# Patient Record
Sex: Male | Born: 1935
Health system: Southern US, Community
[De-identification: ages and names within clinical notes are randomized; demographics above are authoritative.]

## PROBLEM LIST (undated history)

## (undated) DIAGNOSIS — I10 Essential (primary) hypertension: Secondary | ICD-10-CM

## (undated) DIAGNOSIS — I5042 Chronic combined systolic (congestive) and diastolic (congestive) heart failure: Secondary | ICD-10-CM

## (undated) DIAGNOSIS — I8000 Phlebitis and thrombophlebitis of superficial vessels of unspecified lower extremity: Secondary | ICD-10-CM

## (undated) DIAGNOSIS — I4729 Other ventricular tachycardia: Secondary | ICD-10-CM

## (undated) DIAGNOSIS — R042 Hemoptysis: Secondary | ICD-10-CM

## (undated) DIAGNOSIS — I219 Acute myocardial infarction, unspecified: Secondary | ICD-10-CM

## (undated) DIAGNOSIS — I480 Paroxysmal atrial fibrillation: Secondary | ICD-10-CM

## (undated) DIAGNOSIS — I82401 Acute embolism and thrombosis of unspecified deep veins of right lower extremity: Secondary | ICD-10-CM

## (undated) DIAGNOSIS — R7989 Other specified abnormal findings of blood chemistry: Secondary | ICD-10-CM

## (undated) DIAGNOSIS — E7800 Pure hypercholesterolemia, unspecified: Secondary | ICD-10-CM

## (undated) DIAGNOSIS — I4892 Unspecified atrial flutter: Secondary | ICD-10-CM

## (undated) DIAGNOSIS — R339 Retention of urine, unspecified: Secondary | ICD-10-CM

## (undated) DIAGNOSIS — D696 Thrombocytopenia, unspecified: Secondary | ICD-10-CM

## (undated) DIAGNOSIS — K2970 Gastritis, unspecified, without bleeding: Secondary | ICD-10-CM

## (undated) DIAGNOSIS — I959 Hypotension, unspecified: Secondary | ICD-10-CM

## (undated) DIAGNOSIS — I472 Ventricular tachycardia: Secondary | ICD-10-CM

## (undated) DIAGNOSIS — R310 Gross hematuria: Secondary | ICD-10-CM

## (undated) DIAGNOSIS — I4819 Other persistent atrial fibrillation: Secondary | ICD-10-CM

## (undated) DIAGNOSIS — R001 Bradycardia, unspecified: Secondary | ICD-10-CM

## (undated) DIAGNOSIS — I255 Ischemic cardiomyopathy: Secondary | ICD-10-CM

## (undated) DIAGNOSIS — I2510 Atherosclerotic heart disease of native coronary artery without angina pectoris: Secondary | ICD-10-CM

## (undated) DIAGNOSIS — D509 Iron deficiency anemia, unspecified: Secondary | ICD-10-CM

## (undated) DIAGNOSIS — D649 Anemia, unspecified: Secondary | ICD-10-CM

## (undated) DIAGNOSIS — D693 Immune thrombocytopenic purpura: Secondary | ICD-10-CM

## (undated) DIAGNOSIS — N179 Acute kidney failure, unspecified: Secondary | ICD-10-CM

## (undated) HISTORY — DX: Phlebitis and thrombophlebitis of superficial vessels of unspecified lower extremity: I80.00

## (undated) HISTORY — DX: Unspecified atrial flutter: I48.92

## (undated) HISTORY — DX: Iron deficiency anemia, unspecified: D50.9

## (undated) HISTORY — DX: Thrombocytopenia, unspecified: D69.6

## (undated) HISTORY — DX: Ventricular tachycardia: I47.2

## (undated) HISTORY — PX: OTHER SURGICAL HISTORY: SHX169

## (undated) HISTORY — DX: Other specified abnormal findings of blood chemistry: R79.89

## (undated) HISTORY — DX: Acute myocardial infarction, unspecified: I21.9

## (undated) HISTORY — DX: Atherosclerotic heart disease of native coronary artery without angina pectoris: I25.10

## (undated) HISTORY — DX: Acute embolism and thrombosis of unspecified deep veins of right lower extremity: I82.401

## (undated) HISTORY — DX: Bradycardia, unspecified: R00.1

## (undated) HISTORY — DX: Retention of urine, unspecified: R33.9

## (undated) HISTORY — DX: Essential (primary) hypertension: I10

## (undated) HISTORY — DX: Pure hypercholesterolemia, unspecified: E78.00

## (undated) HISTORY — DX: Gross hematuria: R31.0

## (undated) HISTORY — DX: Other ventricular tachycardia: I47.29

## (undated) HISTORY — DX: Chronic combined systolic (congestive) and diastolic (congestive) heart failure: I50.42

## (undated) HISTORY — PX: CARPAL TUNNEL RELEASE: SHX101

## (undated) HISTORY — DX: Hemoptysis: R04.2

## (undated) HISTORY — DX: Ischemic cardiomyopathy: I25.5

## (undated) HISTORY — DX: Hypotension, unspecified: I95.9

## (undated) HISTORY — PX: REPLACEMENT TOTAL KNEE BILATERAL: SUR1225

---

## 1998-05-28 ENCOUNTER — Other Ambulatory Visit: Admission: RE | Admit: 1998-05-28 | Discharge: 1998-05-28 | Payer: Self-pay | Admitting: Urology

## 2000-07-18 ENCOUNTER — Encounter: Payer: Self-pay | Admitting: Orthopedic Surgery

## 2000-07-19 ENCOUNTER — Inpatient Hospital Stay (HOSPITAL_COMMUNITY): Admission: RE | Admit: 2000-07-19 | Discharge: 2000-07-24 | Payer: Self-pay | Admitting: Orthopedic Surgery

## 2000-08-17 ENCOUNTER — Encounter: Admission: RE | Admit: 2000-08-17 | Discharge: 2000-09-14 | Payer: Self-pay | Admitting: Orthopedic Surgery

## 2000-11-07 ENCOUNTER — Encounter: Admission: RE | Admit: 2000-11-07 | Discharge: 2000-11-07 | Payer: Self-pay | Admitting: Orthopedic Surgery

## 2000-11-07 ENCOUNTER — Encounter: Payer: Self-pay | Admitting: Orthopedic Surgery

## 2000-11-21 ENCOUNTER — Encounter: Payer: Self-pay | Admitting: Orthopedic Surgery

## 2000-11-21 ENCOUNTER — Encounter: Admission: RE | Admit: 2000-11-21 | Discharge: 2000-11-21 | Payer: Self-pay | Admitting: Orthopedic Surgery

## 2001-06-29 ENCOUNTER — Encounter: Admission: RE | Admit: 2001-06-29 | Discharge: 2001-06-29 | Payer: Self-pay | Admitting: Orthopedic Surgery

## 2001-06-29 ENCOUNTER — Encounter: Payer: Self-pay | Admitting: Orthopedic Surgery

## 2001-07-13 ENCOUNTER — Encounter: Admission: RE | Admit: 2001-07-13 | Discharge: 2001-07-13 | Payer: Self-pay | Admitting: Orthopedic Surgery

## 2001-07-13 ENCOUNTER — Encounter: Payer: Self-pay | Admitting: Orthopedic Surgery

## 2003-06-18 ENCOUNTER — Inpatient Hospital Stay (HOSPITAL_COMMUNITY): Admission: RE | Admit: 2003-06-18 | Discharge: 2003-06-23 | Payer: Self-pay | Admitting: Orthopedic Surgery

## 2006-11-15 ENCOUNTER — Inpatient Hospital Stay (HOSPITAL_COMMUNITY): Admission: RE | Admit: 2006-11-15 | Discharge: 2006-11-19 | Payer: Self-pay | Admitting: Orthopedic Surgery

## 2007-07-13 HISTORY — PX: CORONARY STENT PLACEMENT: SHX1402

## 2007-08-31 ENCOUNTER — Ambulatory Visit (HOSPITAL_BASED_OUTPATIENT_CLINIC_OR_DEPARTMENT_OTHER): Admission: RE | Admit: 2007-08-31 | Discharge: 2007-08-31 | Payer: Self-pay | Admitting: Orthopedic Surgery

## 2008-03-21 ENCOUNTER — Inpatient Hospital Stay (HOSPITAL_COMMUNITY): Admission: AD | Admit: 2008-03-21 | Discharge: 2008-03-24 | Payer: Self-pay | Admitting: Cardiology

## 2008-03-21 ENCOUNTER — Ambulatory Visit: Payer: Self-pay | Admitting: Cardiology

## 2008-03-21 DIAGNOSIS — I219 Acute myocardial infarction, unspecified: Secondary | ICD-10-CM

## 2008-03-21 HISTORY — DX: Acute myocardial infarction, unspecified: I21.9

## 2008-03-22 ENCOUNTER — Encounter: Payer: Self-pay | Admitting: Cardiology

## 2008-04-08 ENCOUNTER — Ambulatory Visit: Payer: Self-pay | Admitting: Cardiology

## 2008-05-06 ENCOUNTER — Ambulatory Visit: Payer: Self-pay | Admitting: Cardiology

## 2008-05-06 LAB — CONVERTED CEMR LAB
ALT: 24 units/L (ref 0–53)
AST: 29 units/L (ref 0–37)
Albumin: 4.2 g/dL (ref 3.5–5.2)
Alkaline Phosphatase: 75 units/L (ref 39–117)
Basophils Relative: 0.9 % (ref 0.0–3.0)
Bilirubin, Direct: 0.3 mg/dL (ref 0.0–0.3)
Cholesterol: 92 mg/dL (ref 0–200)
Eosinophils Relative: 4.6 % (ref 0.0–5.0)
HCT: 42.8 % (ref 39.0–52.0)
HDL: 24.7 mg/dL — ABNORMAL LOW (ref >39.0)
Hemoglobin: 14.8 g/dL (ref 13.0–17.0)
LDL Cholesterol: 44 mg/dL (ref 0–99)
Lymphocytes Relative: 29 % (ref 12.0–46.0)
MCHC: 34.6 g/dL (ref 30.0–36.0)
MCV: 85.1 fL (ref 78.0–100.0)
Monocytes Relative: 10.3 % (ref 3.0–12.0)
Neutrophils Relative %: 55.2 % (ref 43.0–77.0)
Platelets: 116 10*3/uL — ABNORMAL LOW (ref 150–400)
RBC: 5.02 M/uL (ref 4.22–5.81)
RDW: 12.5 % (ref 11.5–14.6)
Total Bilirubin: 0.8 mg/dL (ref 0.3–1.2)
Total CHOL/HDL Ratio: 3.7
Total Protein: 6.9 g/dL (ref 6.0–8.3)
Triglycerides: 116 mg/dL (ref 0–149)
VLDL: 23 mg/dL (ref 0–40)
WBC: 6.3 10*3/uL (ref 4.5–10.5)

## 2008-06-13 ENCOUNTER — Ambulatory Visit: Payer: Self-pay | Admitting: Cardiology

## 2008-07-24 ENCOUNTER — Ambulatory Visit: Payer: Self-pay | Admitting: Cardiology

## 2008-11-14 ENCOUNTER — Telehealth: Payer: Self-pay | Admitting: Cardiology

## 2008-11-18 ENCOUNTER — Ambulatory Visit: Payer: Self-pay | Admitting: Cardiology

## 2008-11-18 DIAGNOSIS — R310 Gross hematuria: Secondary | ICD-10-CM

## 2008-11-18 DIAGNOSIS — I8000 Phlebitis and thrombophlebitis of superficial vessels of unspecified lower extremity: Secondary | ICD-10-CM | POA: Insufficient documentation

## 2008-11-18 HISTORY — DX: Gross hematuria: R31.0

## 2008-11-29 ENCOUNTER — Encounter: Payer: Self-pay | Admitting: Cardiology

## 2008-11-29 ENCOUNTER — Ambulatory Visit: Payer: Self-pay

## 2009-03-19 ENCOUNTER — Ambulatory Visit: Payer: Self-pay | Admitting: Cardiology

## 2009-04-01 ENCOUNTER — Encounter: Payer: Self-pay | Admitting: Cardiology

## 2009-05-13 ENCOUNTER — Encounter: Payer: Self-pay | Admitting: Cardiology

## 2009-06-10 ENCOUNTER — Encounter: Payer: Self-pay | Admitting: Cardiology

## 2009-07-09 ENCOUNTER — Encounter: Payer: Self-pay | Admitting: Cardiology

## 2009-10-02 ENCOUNTER — Ambulatory Visit: Payer: Self-pay | Admitting: Internal Medicine

## 2009-10-02 ENCOUNTER — Ambulatory Visit: Payer: Self-pay

## 2009-10-02 ENCOUNTER — Ambulatory Visit: Payer: Self-pay | Admitting: Cardiology

## 2009-10-02 ENCOUNTER — Encounter: Payer: Self-pay | Admitting: Cardiology

## 2009-10-02 ENCOUNTER — Ambulatory Visit (HOSPITAL_COMMUNITY): Admission: RE | Admit: 2009-10-02 | Discharge: 2009-10-02 | Payer: Self-pay | Admitting: Cardiology

## 2009-11-18 ENCOUNTER — Inpatient Hospital Stay (HOSPITAL_COMMUNITY): Admission: EM | Admit: 2009-11-18 | Discharge: 2009-11-24 | Payer: Self-pay | Admitting: Emergency Medicine

## 2009-11-18 ENCOUNTER — Ambulatory Visit: Payer: Self-pay | Admitting: Internal Medicine

## 2009-11-20 ENCOUNTER — Ambulatory Visit: Payer: Self-pay | Admitting: Physical Medicine & Rehabilitation

## 2009-11-20 ENCOUNTER — Encounter: Payer: Self-pay | Admitting: Cardiology

## 2009-11-24 ENCOUNTER — Ambulatory Visit: Payer: Self-pay | Admitting: Physical Medicine & Rehabilitation

## 2009-11-24 ENCOUNTER — Ambulatory Visit: Payer: Self-pay | Admitting: Surgery

## 2009-11-24 ENCOUNTER — Encounter (INDEPENDENT_AMBULATORY_CARE_PROVIDER_SITE_OTHER): Payer: Self-pay | Admitting: General Surgery

## 2009-11-24 ENCOUNTER — Inpatient Hospital Stay (HOSPITAL_COMMUNITY)
Admission: RE | Admit: 2009-11-24 | Discharge: 2009-11-29 | Payer: Self-pay | Admitting: Physical Medicine & Rehabilitation

## 2010-01-02 ENCOUNTER — Telehealth: Payer: Self-pay | Admitting: Cardiology

## 2010-01-23 ENCOUNTER — Ambulatory Visit: Payer: Self-pay | Admitting: Cardiology

## 2010-01-29 ENCOUNTER — Encounter: Payer: Self-pay | Admitting: Cardiology

## 2010-05-01 ENCOUNTER — Encounter: Payer: Self-pay | Admitting: Cardiology

## 2010-06-26 ENCOUNTER — Ambulatory Visit: Payer: Self-pay | Admitting: Cardiology

## 2010-06-26 ENCOUNTER — Encounter: Payer: Self-pay | Admitting: Cardiology

## 2010-08-07 ENCOUNTER — Encounter: Payer: Self-pay | Admitting: Cardiology

## 2010-08-11 NOTE — Letter (Signed)
Summary: [facility] Office Visit Note   [facility] Office Visit Note   Imported By: Sallee Provencal 01/26/2010 13:23:20  _____________________________________________________________________  External Attachment:    Type:   Image     Comment:   External Document

## 2010-08-11 NOTE — Progress Notes (Signed)
Summary: appt  Phone Note Call from Patient Call back at Home Phone [phone]   Caller: Spouse Reason for Call: Talk to Nurse Summary of Call: pt had to cancel appt today.... sick, resch appt to 8/3, is this to far out? Initial call taken by: Darnell Level,  January 02, 2010 8:40 AM  Follow-up for Phone Call        I spoke with the pt's wife and moved the pt's appt up to 01/23/10.  Follow-up by: Theodosia Quay, RN, BSN,  January 02, 2010 9:49 AM

## 2010-08-11 NOTE — Letter (Signed)
Summary: [facility]  [facility]   Imported By: Sallee Provencal 09/05/2009 14:32:27  _____________________________________________________________________  External Attachment:    Type:   Image     Comment:   External Document

## 2010-08-11 NOTE — Assessment & Plan Note (Signed)
Summary: EPH   Visit Type:  post-hospital  CC:  Right hand cramps.  History of Present Illness: Was hospitalized after falling off of a ladder.  He was climbing on the roof.  Has seen Dr. Terance Hart, and Dr. Eddie Dibbles.  Dr. Terance Hart has cleared him, and Dr. Eddie Dibbles gave him Calcium for a bone density issue.  No chest pain.  Patient has had a cramp in his hand. He then developed mildly pos enzymes, and abnormal ECG which settled down.  Now absolutely no chest pain, or other symptoms.  He has been back up on a ladder, but did not tell his family.  No exertional or rest angina symptoms.    Current Medications (verified): 1)  Metoprolol Tartrate 25 Mg Tabs (Metoprolol Tartrate) .... Take1/2 Tablet By Mouth Twice A Day 2)  Aspirin 81 Mg Tbec (Aspirin) .... Take One Tablet By Mouth Daily 3)  Red Yeast Rice Extract 600 Mg Caps (Red Yeast Rice Extract) .... Take 1 Capsule By Mouth Once A Day 4)  Calcium Citrate 950 Mg Tabs (Calcium Citrate) .... Take 1 Tablet By Mouth Two Times A Day 5)  Vitamin D3 2000 Unit Caps (Cholecalciferol) .... Take 1 Capsule By Mouth Once A Day 6)  Flonase 50 Mcg/act Susp (Fluticasone Propionate) .... 4 Sprays Per Day 7)  Lyrica 75 Mg Caps (Pregabalin) .... As Needed  Allergies: 1)  ! Crestor (Rosuvastatin Calcium)  Vital Signs:  Patient profile:   77 year old male Height:      72 inches Weight:      151.31 pounds BMI:     20.60 Pulse rate:   68 / minute Pulse rhythm:   regular Resp:     18 per minute BP sitting:   114 / 60  (left arm) Cuff size:   regular  Vitals Entered By: [name] (January 23, 2010 2:51 PM)  Physical Exam  General:  Well developed, well nourished, in no acute distress. Head:  normocephalic and atraumatic Eyes:  PERRLA/EOM intact; conjunctiva and lids normal. Lungs:  Clear bilaterally to auscultation and percussion. Heart:  PMI non displaced.  Normal S1 and S2.  Soft SEM.  No DM.  No rub or gallop. Abdomen:  Bowel sounds positive; abdomen soft  and non-tender without masses, organomegaly, or hernias noted. No hepatosplenomegaly. Extremities:  No clubbing or cyanosis. Neurologic:  Alert and oriented x 3.   Cardiac Cath  Procedure date:  03/21/2008  Findings:      ANGIOGRAPHIC DATA:   1. Ventriculography in the RAO projection reveals hypo-to-akinesis of       the anteroapical segment.  The ejection fraction estimate will be       45%.   2. The left main is free of critical disease.   3. The LAD has a 60% narrowing at the septal.  There is some mild       plaquing distal to this and then just after another septal, there       is a 90% narrowing, then total occlusion after a small diagonal.       The extent of disease extends into just beyond the next diagonal.       The proximal lesion was covered with a 2.5-mm stent with reduction       from 60%-0%.  The mid LAD, which was the site of total occlusion       was reduced from 90% and 100% down to 0%.  The distal lesion was  80%-90% in the distal vessel and reduced from 80%-90% to less than       20% residual narrowing with smooth angiographic result and good       runoff into the distal vessel.   4. The circumflex provides a large marginal branch with diffuse       segmental 60% narrowing followed by a large marginal branch.  There       was a tiny subbranch with 90% narrowing and an inferior subbranch       with perhaps 60% narrowing.   5. The right coronary artery is segmentally plaqued in the midvessel       and then totally occluded after an RV branch.  This RV branch then       collateralizes the distal vessel.  The distal vessel was supplied       by the LAD septals.      CONCLUSION:   1. Mild-to-moderate reduction in left ventricular function with an       anteroapical wall motion abnormality.   2. Total occlusion of the mid left anterior descending artery with       successful percutaneous angioplasty and stenting using a non drug-       eluting stent.   3.  Successful percutaneous stenting of a moderate proximal lesion of       the left anterior descending artery.   4. Successful percutaneous angioplasty of distal LAD stenosis.   5. Total occlusion of the right coronary artery with       recollateralization of the distal vessel by b  EKG  Procedure date:  01/23/2010  Findings:      NSR.  ASMI, old.  Left axis deviation.   Impression & Recommendations:  Problem # 1:  CAD, NATIVE VESSEL (ICD-414.01) Remains stable at present.  Not having chest pain.  Some ischemia while in the hospital, with borderline enzymes.  Now better.  Would favor continued medical therapy.  Stressed avoidance of over exertion.  His updated medication list for this problem includes:    Metoprolol Tartrate 25 Mg Tabs (Metoprolol tartrate) .[name]... Take1/2 tablet by mouth twice a day    Aspirin 81 Mg Tbec (Aspirin) .[name]... Take one tablet by mouth daily  Orders: EKG w/ Interpretation (93000)  Problem # 2:  HYPERCHOLESTEROLEMIA  IIA (ICD-272.0) Has chosen not to take statins.   Patient Instructions: 1)  Your physician recommends that you continue on your current medications as directed. Please refer to the Current Medication list given to you today. 2)  Your physician wants you to follow-up in:  3-4 MONTHS.  You will receive a reminder letter in the mail two months in advance. If you don't receive a letter, please call our office to schedule the follow-up appointment.

## 2010-08-13 NOTE — Assessment & Plan Note (Signed)
Summary: f70m   Visit Type:  Follow-up Primary Provider:  schultz  CC:  No cardiac complaints.  History of Present Illness: Really doing quite well.  Denies any ongoing chest pain at the present time.  He feels good.  Has some stinging and burning in his leg, and Dr. Eddie Dibbles put him on Celebrex.  He and I discussed use of the Cox inhibitors today in some detail.    Problems Prior to Update: 1)  Cad, Native Vessel  (ICD-414.01) 2)  Hypercholesterolemia Iia  (ICD-272.0) 3)  Superficial Phlebitis  (ICD-451.0) 4)  Gross Hematuria  (ICD-599.71) 5)  Cough  (ICD-786.2)  Current Medications (verified): 1)  Metoprolol Tartrate 25 Mg Tabs (Metoprolol Tartrate) .... Take1/2 Tablet By Mouth Twice A Day 2)  Aspirin 81 Mg Tbec (Aspirin) .... Take One Tablet By Mouth Daily 3)  Red Yeast Rice Extract 600 Mg Caps (Red Yeast Rice Extract) .... Take 1 Capsule By Mouth Once A Day 4)  Calcium Citrate 950 Mg Tabs (Calcium Citrate) .... Take 1 Tablet By Mouth Two Times A Day 5)  Vitamin D3 2000 Unit Caps (Cholecalciferol) .... Take 1 Capsule By Mouth Once A Day 6)  Flonase 50 Mcg/act Susp (Fluticasone Propionate) .... 4 Sprays Per Day 7)  Lyrica 75 Mg Caps (Pregabalin) .... As Needed 8)  Celebrex 200 Mg Caps (Celecoxib) .... Take 1 Capsule By Mouth Once A Day  Allergies: 1)  ! Crestor (Rosuvastatin Calcium)  Past History:  Past Medical History: Last updated: 09/26/2009 Myocardial Infarction  03/21/2008 PCI-BMS (S/P) Current Problems:  CAD, NATIVE VESSEL (ICD-414.01) HYPERCHOLESTEROLEMIA  IIA (ICD-272.0) SUPERFICIAL PHLEBITIS (ICD-451.0) GROSS HEMATURIA (ICD-599.71) COUGH (ICD-786.2)  Past Surgical History: Last updated: 30-Jul-2008 Bilateral knee replacements Carpal Tunnel Surgery Trigger finger surgery  Family History: Last updated: 07-30-08 FAMILY HISTORY:  Mother died at 67 of unknown causes, his father died at   53 of unknown causes, his 3 sisters all are deceased at least 2 are from   cancer and he is unsure about the other.      Social History: Last updated: 2008/07/30 SOCIAL HISTORY:  He lives in [place], [place] with his wife.   He is a retired Horticulturist, commercial.  He does not exercise, but is active at   home.  He has 4 grown children.  He denies tobacco, alcohol, or drug use   ever in the lifetime.      Vital Signs:  Patient profile:   77 year old male Height:      72 inches Weight:      169.50 pounds BMI:     23.07 Pulse rate:   51 / minute Pulse rhythm:   regular Resp:     18 per minute BP sitting:   155 / 80  Vitals Entered By: Sidney Ace (June 26, 2010 12:17 PM)  Physical Exam  General:  elderly but pleasant gentleman in no acute distress. Lungs:  Clear bilaterally to auscultation and percussion. Heart:  PMI non displaced. Normal S1 and S2.  Without murmur, or rub.  Pos S4. Abdomen:  Bowel sounds positive; abdomen soft and non-tender without masses, organomegaly, or hernias noted. No hepatosplenomegaly. Pulses:  pulses normal in all 4 extremities Extremities:  No clubbing or cyanosis. Neurologic:  Alert and oriented x 3.   EKG  Procedure date:  06/26/2010  Findings:      NSR.  Leftward axis.  ASMI, old.    Cardiac Cath  Procedure date:  03/21/2008  Findings:  CONCLUSION:   1. Mild-to-moderate reduction in left ventricular function with an       anteroapical wall motion abnormality.   2. Total occlusion of the mid left anterior descending artery with       successful percutaneous angioplasty and stenting using a non drug-       eluting stent.   3. Successful percutaneous stenting of a moderate proximal lesion of       the left anterior descending artery.   4. Successful percutaneous angioplasty of distal LAD stenosis.   5. Total occlusion of the right coronary artery with       recollateralization of the distal vessel by bridging and retrograde       collaterals.   6. Moderate plaquing of the circumflex artery.        DISPOSITION:  At the present time, the patient will be treated   medically.  He will receive aspirin and Plavix for minimum of 1 month   and preferably for 1 year, if he tolerates this.  However, the Plavix   may need to be backed off particularly in light of the patient's   hematuria.  The patient also will receive beta blockers as well as   statins.  ACE inhibition will be considered.  He will follow up with   [facility] for cardiac care and Dr. Denton Lank office for primary   care.  He will continue to be followed by Dr. Terance Hart for his   hematuria.      Impression & Recommendations:  Problem # 1:  CAD, NATIVE VESSEL (ICD-414.01) Has MVD.  Overall stable at present.  Denies ongoing symptoms despite extend of disease.  Continue medical therapy as at present.   His updated medication list for this problem includes:    Metoprolol Tartrate 25 Mg Tabs (Metoprolol tartrate) .[name]... Take1/2 tablet by mouth twice a day    Aspirin 81 Mg Tbec (Aspirin) .[name]... Take one tablet by mouth daily  Orders: EKG w/ Interpretation (93000)  Problem # 2:  HYPERCHOLESTEROLEMIA  IIA (ICD-272.0) followed by Dr. Delena Bali.  Taking Red yeast rice.   Orders: EKG w/ Interpretation (93000)  Problem # 3:  HYPERTENSION, BENIGN (ICD-401.1) mild elevation.  His updated medication list for this problem includes:    Metoprolol Tartrate 25 Mg Tabs (Metoprolol tartrate) .[name]... Take1/2 tablet by mouth twice a day    Aspirin 81 Mg Tbec (Aspirin) .[name]... Take one tablet by mouth daily  Patient Instructions: 1)  Your physician recommends that you continue on your current medications as directed. Please refer to the Current Medication list given to you today. 2)  Your physician wants you to follow-up in: 6 MONTHS.  You will receive a reminder letter in the mail two months in advance. If you don't receive a letter, please call our office to schedule the follow-up appointment.

## 2010-09-28 LAB — BASIC METABOLIC PANEL
BUN: 26 mg/dL — ABNORMAL HIGH (ref 6–23)
CO2: 29 mEq/L (ref 19–32)
CO2: 31 mEq/L (ref 19–32)
Calcium: 8.5 mg/dL (ref 8.4–10.5)
Calcium: 8.9 mg/dL (ref 8.4–10.5)
Chloride: 103 mEq/L (ref 96–112)
Creatinine, Ser: 1.08 mg/dL (ref 0.4–1.5)
Creatinine, Ser: 1.1 mg/dL (ref 0.4–1.5)
GFR calc Af Amer: >60 mL/min (ref >60)
GFR calc Af Amer: >60 mL/min (ref >60)
GFR calc non Af Amer: >60 mL/min (ref >60)
GFR calc non Af Amer: >60 mL/min (ref >60)
Glucose, Bld: 126 mg/dL — ABNORMAL HIGH (ref 70–99)
Potassium: 3.7 mEq/L (ref 3.5–5.1)
Sodium: 134 mEq/L — ABNORMAL LOW (ref 135–145)
Sodium: 137 mEq/L (ref 135–145)

## 2010-09-28 LAB — DIFFERENTIAL
Basophils Relative: 1 % (ref 0–1)
Eosinophils Absolute: 0.1 10*3/uL (ref 0.0–0.7)
Eosinophils Relative: 1 % (ref 0–5)
Lymphs Abs: 0.7 10*3/uL (ref 0.7–4.0)
Neutrophils Relative %: 77 % (ref 43–77)

## 2010-09-28 LAB — COMPREHENSIVE METABOLIC PANEL
ALT: 23 U/L (ref 0–53)
AST: 26 U/L (ref 0–37)
Alkaline Phosphatase: 61 U/L (ref 39–117)
CO2: 29 mEq/L (ref 19–32)
Calcium: 8.7 mg/dL (ref 8.4–10.5)
Chloride: 99 mEq/L (ref 96–112)
GFR calc Af Amer: >60 mL/min (ref >60)
GFR calc non Af Amer: >60 mL/min (ref >60)
Glucose, Bld: 117 mg/dL — ABNORMAL HIGH (ref 70–99)
Potassium: 3.8 mEq/L (ref 3.5–5.1)
Sodium: 135 mEq/L (ref 135–145)
Total Bilirubin: 3.3 mg/dL — ABNORMAL HIGH (ref 0.3–1.2)

## 2010-09-28 LAB — CBC
Hemoglobin: 10.1 g/dL — ABNORMAL LOW (ref 13.0–17.0)
Hemoglobin: 10.2 g/dL — ABNORMAL LOW (ref 13.0–17.0)
MCHC: 33.9 g/dL (ref 30.0–36.0)
MCHC: 33.9 g/dL (ref 30.0–36.0)
MCHC: 34.9 g/dL (ref 30.0–36.0)
Platelets: 82 10*3/uL — ABNORMAL LOW (ref 150–400)
RBC: 3.27 MIL/uL — ABNORMAL LOW (ref 4.22–5.81)
RBC: 3.35 MIL/uL — ABNORMAL LOW (ref 4.22–5.81)
RDW: 13.7 % (ref 11.5–15.5)
WBC: 7.5 10*3/uL (ref 4.0–10.5)
WBC: 7.8 10*3/uL (ref 4.0–10.5)

## 2010-09-28 LAB — PROTIME-INR
INR: 1.23 (ref 0.00–1.49)
Prothrombin Time: 15.4 seconds — ABNORMAL HIGH (ref 11.6–15.2)

## 2010-09-29 LAB — URINE CULTURE
Colony Count: NO GROWTH
Culture: NO GROWTH

## 2010-09-29 LAB — CROSSMATCH
ABO/RH(D): A POS
Antibody Screen: NEGATIVE

## 2010-09-29 LAB — POCT I-STAT, CHEM 8
Calcium, Ion: 1.16 mmol/L (ref 1.12–1.32)
Creatinine, Ser: 1.2 mg/dL (ref 0.4–1.5)
Glucose, Bld: 140 mg/dL — ABNORMAL HIGH (ref 70–99)
Hemoglobin: 14.3 g/dL (ref 13.0–17.0)
TCO2: 24 mmol/L (ref 0–100)

## 2010-09-29 LAB — COMPREHENSIVE METABOLIC PANEL
ALT: 22 U/L (ref 0–53)
ALT: 23 U/L (ref 0–53)
Alkaline Phosphatase: 40 U/L (ref 39–117)
Alkaline Phosphatase: 41 U/L (ref 39–117)
Alkaline Phosphatase: 63 U/L (ref 39–117)
BUN: 22 mg/dL (ref 6–23)
BUN: 28 mg/dL — ABNORMAL HIGH (ref 6–23)
CO2: 24 mEq/L (ref 19–32)
CO2: 26 mEq/L (ref 19–32)
Chloride: 106 mEq/L (ref 96–112)
Chloride: 108 mEq/L (ref 96–112)
Creatinine, Ser: 1.33 mg/dL (ref 0.4–1.5)
GFR calc non Af Amer: >60 mL/min (ref >60)
GFR calc non Af Amer: >60 mL/min (ref >60)
Glucose, Bld: 136 mg/dL — ABNORMAL HIGH (ref 70–99)
Glucose, Bld: 140 mg/dL — ABNORMAL HIGH (ref 70–99)
Glucose, Bld: 99 mg/dL (ref 70–99)
Potassium: 3.2 mEq/L — ABNORMAL LOW (ref 3.5–5.1)
Potassium: 3.6 mEq/L (ref 3.5–5.1)
Potassium: 4.5 mEq/L (ref 3.5–5.1)
Sodium: 138 mEq/L (ref 135–145)
Sodium: 139 mEq/L (ref 135–145)
Total Bilirubin: 1.5 mg/dL — ABNORMAL HIGH (ref 0.3–1.2)
Total Bilirubin: 1.9 mg/dL — ABNORMAL HIGH (ref 0.3–1.2)

## 2010-09-29 LAB — LACTIC ACID, PLASMA: Lactic Acid, Venous: 3.5 mmol/L — ABNORMAL HIGH (ref 0.5–2.2)

## 2010-09-29 LAB — CBC
HCT: 24.8 % — ABNORMAL LOW (ref 39.0–52.0)
HCT: 25 % — ABNORMAL LOW (ref 39.0–52.0)
HCT: 39.8 % (ref 39.0–52.0)
Hemoglobin: 10.9 g/dL — ABNORMAL LOW (ref 13.0–17.0)
Hemoglobin: 13.5 g/dL (ref 13.0–17.0)
Hemoglobin: 7.4 g/dL — ABNORMAL LOW (ref 13.0–17.0)
Hemoglobin: 8.1 g/dL — ABNORMAL LOW (ref 13.0–17.0)
Hemoglobin: 8.2 g/dL — ABNORMAL LOW (ref 13.0–17.0)
Hemoglobin: 9.5 g/dL — ABNORMAL LOW (ref 13.0–17.0)
MCHC: 33.9 g/dL (ref 30.0–36.0)
MCHC: 34 g/dL (ref 30.0–36.0)
MCHC: 34.2 g/dL (ref 30.0–36.0)
MCHC: 34.2 g/dL (ref 30.0–36.0)
MCHC: 34.3 g/dL (ref 30.0–36.0)
MCHC: 34.3 g/dL (ref 30.0–36.0)
MCHC: 34.6 g/dL (ref 30.0–36.0)
MCHC: 34.8 g/dL (ref 30.0–36.0)
MCV: 86.8 fL (ref 78.0–100.0)
MCV: 87.2 fL (ref 78.0–100.0)
Platelets: 49 10*3/uL — ABNORMAL LOW (ref 150–400)
Platelets: 50 10*3/uL — ABNORMAL LOW (ref 150–400)
Platelets: 54 10*3/uL — ABNORMAL LOW (ref 150–400)
Platelets: 59 10*3/uL — ABNORMAL LOW (ref 150–400)
Platelets: 94 10*3/uL — ABNORMAL LOW (ref 150–400)
RBC: 2.51 MIL/uL — ABNORMAL LOW (ref 4.22–5.81)
RBC: 2.75 MIL/uL — ABNORMAL LOW (ref 4.22–5.81)
RBC: 2.86 MIL/uL — ABNORMAL LOW (ref 4.22–5.81)
RBC: 2.86 MIL/uL — ABNORMAL LOW (ref 4.22–5.81)
RBC: 3.16 MIL/uL — ABNORMAL LOW (ref 4.22–5.81)
RDW: 13.4 % (ref 11.5–15.5)
RDW: 13.4 % (ref 11.5–15.5)
RDW: 13.4 % (ref 11.5–15.5)
RDW: 13.5 % (ref 11.5–15.5)
RDW: 13.6 % (ref 11.5–15.5)
WBC: 6.8 10*3/uL (ref 4.0–10.5)
WBC: 7 10*3/uL (ref 4.0–10.5)
WBC: 7.5 10*3/uL (ref 4.0–10.5)
WBC: 8.6 10*3/uL (ref 4.0–10.5)

## 2010-09-29 LAB — URINALYSIS, ROUTINE W REFLEX MICROSCOPIC
Bilirubin Urine: NEGATIVE
Ketones, ur: NEGATIVE mg/dL
Protein, ur: NEGATIVE mg/dL
Urobilinogen, UA: 0.2 mg/dL (ref 0.0–1.0)

## 2010-09-29 LAB — DIFFERENTIAL
Basophils Relative: 0 % (ref 0–1)
Lymphs Abs: 0.9 10*3/uL (ref 0.7–4.0)
Monocytes Relative: 7 % (ref 3–12)
Neutro Abs: 5.3 10*3/uL (ref 1.7–7.7)
Neutrophils Relative %: 76 % (ref 43–77)

## 2010-09-29 LAB — BASIC METABOLIC PANEL
Chloride: 105 mEq/L (ref 96–112)
GFR calc Af Amer: >60 mL/min (ref >60)
GFR calc non Af Amer: >60 mL/min (ref >60)
Potassium: 3.8 mEq/L (ref 3.5–5.1)

## 2010-09-29 LAB — CARDIAC PANEL(CRET KIN+CKTOT+MB+TROPI)
CK, MB: 18.8 ng/mL (ref 0.3–4.0)
Relative Index: 3.1 — ABNORMAL HIGH (ref 0.0–2.5)

## 2010-09-29 LAB — CK TOTAL AND CKMB (NOT AT ARMC)
CK, MB: 22.8 ng/mL (ref 0.3–4.0)
CK, MB: 25.9 ng/mL (ref 0.3–4.0)
Relative Index: 1.7 (ref 0.0–2.5)
Total CK: 574 U/L — ABNORMAL HIGH (ref 7–232)

## 2010-09-29 LAB — PROTIME-INR
INR: 1.23 (ref 0.00–1.49)
INR: 1.28 (ref 0.00–1.49)
INR: 1.42 (ref 0.00–1.49)
Prothrombin Time: 15.4 seconds — ABNORMAL HIGH (ref 11.6–15.2)
Prothrombin Time: 15.6 seconds — ABNORMAL HIGH (ref 11.6–15.2)
Prothrombin Time: 15.9 seconds — ABNORMAL HIGH (ref 11.6–15.2)
Prothrombin Time: 17.2 seconds — ABNORMAL HIGH (ref 11.6–15.2)

## 2010-09-29 LAB — TROPONIN I: Troponin I: 0.05 ng/mL (ref 0.00–0.06)

## 2010-09-29 LAB — SAMPLE TO BLOOD BANK

## 2010-09-29 LAB — APTT: aPTT: 34 seconds (ref 24–37)

## 2010-09-29 LAB — ABO/RH: ABO/RH(D): A POS

## 2010-09-29 LAB — URINE MICROSCOPIC-ADD ON

## 2010-11-24 NOTE — Letter (Signed)
July 24, 2008    Nelda Bucks, MD  [phone], [place]  [address]   RE:  [name]  MRN:  [id number]  /  DOB:  08/31/1923   Dear [name],   I had the pleasure of seeing your nice patient [name] in the  office today in followup.  In general, he has been overall stable.  He  did have some bleeding, and this was characterized by hematuria.  He  spoke with Dr. Terance Hart.  His aspirin and Plavix were discontinued, then  he restarted the aspirin and this was followed again by some urinary  bleeding, therefore he is off all antiplatelet therapy presently.  Importantly, the patient has had no recurrent chest pain or any major  problems.  His hematuria has cleared since the aspirin has been  discontinued.  The patient did have a lipid profile done here May 06, 2008, this revealed a total cholesterol of 92 with an LDL of 44 and  an HDL of 24.  This remains somewhat low.  His liver function studies  were within normal limits.   Overall, this gentleman has done well.   PHYSICAL EXAMINATION:  VITAL SIGNS:  Today, the blood pressure is 122/60  and the pulse is 64.  LUNGS:  The lung fields are clear.  CARDIAC:  Rhythm is regular.   Overall, [name] has done well from a cardiac standpoint.  He has  had no recurrent symptoms.  I plan to see him back in followup in 6  months.  He is aware that he could have  restenosis at the stent site.  At some point, it would be reasonable to  attempt to again put him on antiplatelet therapy, but for now it would  be reasonable to hold.  Should there be any further problems with him,  please do not hesitate to let me know.  I would be happy to see him at  any time.    Sincerely,      Loretha Brasil. Lia Foyer, MD, [facility]  Electronically Signed    TDS/MedQ  DD: 07/24/2008  DT: 07/25/2008  Job #: [id number]

## 2010-11-24 NOTE — Cardiovascular Report (Signed)
[name], [name] NO.:  [id number]   MEDICAL RECORD NO.:  [id number]          PATIENT TYPE:  INP   LOCATION:  2913                         FACILITY:  [facility]   PHYSICIAN:  Loretha Brasil. Lia Foyer, MD, FACCDATE OF BIRTH:  1924/01/13   DATE OF PROCEDURE:  03/21/2008  DATE OF DISCHARGE:                            CARDIAC CATHETERIZATION   INDICATIONS:  Mr. Schmalzried is an 77 year old gentleman who called EMS,  and was found to have an anterior wall infarction in the field.  As a  result, he was brought straight to the catheterization laboratory by the  EMS service.  When he arrived, he had ongoing chest pain.  He was  treated as an emergency, ST elevation MI.   PROCEDURE:  1. Left heart catheterization.  2. Selective coronary arteriography.  3. Selective left ventriculography.  4. Percutaneous stenting of the proximal and mid left anterior      descending artery with non drug-eluting stents.  5. Percutaneous angioplasty of the distal left anterior descending      artery.   DESCRIPTION OF PROCEDURE:  The patient was brought to the  catheterization laboratory and prepped and draped in usual fashion.  Through an anterior puncture, the femoral artery was easily entered.  A  6-French sheath was then placed.  i-STAT was obtained to determine  creatinine and hemoglobin.  Views of the right coronary artery were then  obtained.  A guiding catheter was used to engage the left coronary  artery.  Bivalirudin was given according to protocol, and aspirin had  been administered in the trunk.  We elected to go ahead and give oral  clopidogrel at 600 mg.  Following this, preparations were made for  emergent angioplasty.  A JL-3.5 guiding catheter was utilized and high-  torque floppy wire was then taken down into the distal vessel.  The site  of total occlusion was then crossed with a 2-mm balloon and  predilatation done.  There was a establishment of flow.  With this,  there was  evidence that there was going to be a fairly long area.  The  patient has a history of hematuria, and this occurs on an intermittent  basis, but up to 2 times per month.  As a result, we elected to use a  non drug-eluting platform.  A 2.25 x 28 mini Vision stent was then  placed and taken up to about 13 atmospheres.  Following this, there was  also evidence of moderately high-grade disease in the proximal LAD and  this was a fairly short discrete lesion that was covered with a 2.5-mm  stent.  A 2.5 x 12 mini Vision was utilized, and this was then taken up  to 14 atmospheres as well.  A 2.75-mm [place] Voyager was then used to post  dilate both the distal and proximal stents.  The patient continued to  have residual disease of approximately 80%-90% distally, we used a 2.25  x 15 apex balloon to do several long inflations with marked improvement  appearance of the distal artery.  With this, all catheters were  subsequently removed.  Pigtail catheter was then placed  in the left  ventricle and angiography was performed into the LV.  We then removed  all catheters and the femoral sheath was sewn into place.  He was taken  to the holding area in satisfactory clinical condition.  I then spoke  with his family in detail about the presentation and clinical findings.   HEMODYNAMIC DATA:  1. Central aortic pressure was 154/69, mean 109.  2. Left ventricular pressure 146/18.  3. There was no gradient or pullback across the aortic valve.   ANGIOGRAPHIC DATA:  1. Ventriculography in the RAO projection reveals hypo-to-akinesis of      the anteroapical segment.  The ejection fraction estimate will be      45%.  2. The left main is free of critical disease.  3. The LAD has a 60% narrowing at the septal.  There is some mild      plaquing distal to this and then just after another septal, there      is a 90% narrowing, then total occlusion after a small diagonal.      The extent of disease extends into just  beyond the next diagonal.      The proximal lesion was covered with a 2.5-mm stent with reduction      from 60%-0%.  The mid LAD, which was the site of total occlusion      was reduced from 90% and 100% down to 0%.  The distal lesion was      80%-90% in the distal vessel and reduced from 80%-90% to less than      20% residual narrowing with smooth angiographic result and good      runoff into the distal vessel.  4. The circumflex provides a large marginal branch with diffuse      segmental 60% narrowing followed by a large marginal branch.  There      was a tiny subbranch with 90% narrowing and an inferior subbranch      with perhaps 60% narrowing.  5. The right coronary artery is segmentally plaqued in the midvessel      and then totally occluded after an RV branch.  This RV branch then      collateralizes the distal vessel.  The distal vessel was supplied      by the LAD septals.   CONCLUSION:  1. Mild-to-moderate reduction in left ventricular function with an      anteroapical wall motion abnormality.  2. Total occlusion of the mid left anterior descending artery with      successful percutaneous angioplasty and stenting using a non drug-      eluting stent.  3. Successful percutaneous stenting of a moderate proximal lesion of      the left anterior descending artery.  4. Successful percutaneous angioplasty of distal LAD stenosis.  5. Total occlusion of the right coronary artery with      recollateralization of the distal vessel by bridging and retrograde      collaterals.  6. Moderate plaquing of the circumflex artery.   DISPOSITION:  At the present time, the patient will be treated  medically.  He will receive aspirin and Plavix for minimum of 1 month  and preferably for 1 year, if he tolerates this.  However, the Plavix  may need to be backed off particularly in light of the patient's  hematuria.  The patient also will receive beta blockers as well as  statins.  ACE  inhibition will be considered.  He will follow  up with  [facility] for cardiac care and Dr. Denton Lank office for primary  care.  He will continue to be followed by Dr. Terance Hart for his  hematuria.      Loretha Brasil. Lia Foyer, MD, [facility]  Electronically Signed     TDS/MEDQ  D:  03/21/2008  T:  03/22/2008  Job:  [id number]   cc:   Loretha Brasil. Lia Foyer, MD, [facility]  Dr. Delena Bali

## 2010-11-24 NOTE — Letter (Signed)
May 06, 2008    Nelda Bucks, MD  [address], [place]  [address]   RE:  TYRICK, CESARIO  MRN:  [id number]  /  DOB:  1924/04/08   Dear [name]:   I had the pleasure of seeing Mr. Vicencio in the office today in  followup.  He really looks quite good.  His wife says he is staying very  active.  This very nice gentleman underwent percutaneous intervention as  you know, he has really done quite well since that time.  He has had  both knees replaced.  He has not had any major side effects from  medications.   PHYSICAL EXAMINATION:  VITAL SIGNS:  Today, the blood pressure is 112/62  and pulse is 60.  LUNGS:  Fields are clear.  CARDIAC:  Rhythm is regular.   At the present time, he seems to be getting along reasonably well.  We  can currently cut his aspirin to 81 mg daily.  We will also do a routine  stress test given the patient's age.  Lipid and liver profile will also  be obtained today as he is now almost 6 hours from the time of last  meal.  I  will forward those to your office.  I appreciate the opportunity of  sharing in his care and I will let you know once we do the treadmill.      Sincerely,      Loretha Brasil. Lia Foyer, MD, [facility]  Electronically Signed    TDS/MedQ  DD: 05/06/2008  DT: 05/07/2008  Job #: [id number]

## 2010-11-24 NOTE — Discharge Summary (Signed)
[name], [name] NO.:  [id number]   MEDICAL RECORD NO.:  [id number]          PATIENT TYPE:  INP   LOCATION:  [id number]                         FACILITY:  [facility]   PHYSICIAN:  Deboraha Sprang, MD, FACCDATE OF BIRTH:  1924/06/11   DATE OF ADMISSION:  03/21/2008  DATE OF DISCHARGE:  03/24/2008                         DISCHARGE SUMMARY - REFERRING   DISCHARGE DIAGNOSES:  1. ST elevation myocardial infarction anterior.  2. Coronary artery disease.  3. Status post bare metal stenting to the proximal LAD x2 with      angioplasty to the distal LAD.  4. Nonsustained ventricular tachycardia.  5. Thrombocytopenia.  6. Mild congestive heart failure associated with #1.  7. Hyperlipidemia.  8. Hypertension history as noted previously.   PROCEDURES PERFORMED:  Emergent cardiac catheterization on March 21, 2008 with bare metal stenting to the proximal LAD x2 and angioplasty to  the distal LAD by Dr. Addison Lank.   DISPOSITION:  [name] is discharged home.  Is asked to maintain  low sodium heart healthy diet.  Activities and wound care are restricted  post supplemental sheet after cardiac catheterization.  New  prescriptions were given to him in regards to aspirin 325 mg daily,  Plavix 75 mg daily, Lopressor 12.5 mg b.i.d., Crestor 40 mg daily,  lisinopril 5 mg daily, nitroglycerin 0.4 as needed.  When he follows up  with Dr. Lia Foyer on September 28, at 3:45, he will need blood work in  regard to his CBC and a BMET.  He will also need blood work in 6 to 8  weeks to follow up on FLP and LFTs since Crestor was initiated.  He was  asked to bring all medications to all appointments.   DISCHARGE TIME:  30 minutes.   [name] is an 77 year old white male who developed at  approximately 10:30 a.m. lower sternal epigastric pressure sensation  associated with nausea radiating to the mid scapular area.  This was  followed with nausea, diaphoresis, and vomiting.  Due  to persistence she  presented to the emergency room for further evaluation.  EKG showed ST  segment elevation.   PAST MEDICAL HISTORY:  Essentially unremarkable, except for bilateral  knee replacement, right carpal tunnel  and trigger finger surgery.   LABORATORY DATA:  Chest x-ray on the 10th showed mild pulmonary vascular  congestion.  EKGs revealed normal sinus rhythm with a voltage anterior  ST segment elevation with loss of R wave.  Admission weight was 75.8 kg.  H&H was 13.5 and 39.4, normal MCV, platelets 115, WBCs 7.8.  At the time  of discharge, H&H was 14.1 and 42.1.  Normal indices.  Platelets 109,  WBCs 6.6.  PTT 48, PT 16.5.  Sodium 137, potassium 3.9, BUN 13,  creatinine 1.09, glucose 111.  Total bilirubin is slightly elevated at  1.4.  AST 164.  On admission, CK-MB was 1578, 278.3, and a relative  index of 17.6, and a troponin of 64.64, BNP 262.  Subsequent enzymes  were declining.  Fasting lipids showed a total cholesterol of 132,  triglycerides 65, HDL 25, LDL 94, TSH 1.381.   HOSPITAL  COURSE:  [name] is taken emergently to the  catheterization laboratory by Dr. Lia Foyer.  Ejection fraction was 45%  with wall motion abnormalities in the apical anterior region.  Dr.  Lia Foyer placed a bare metal stent x2 to the proximal mid LAD as well as  angioplasty to the distal LAD.  It is noted that he has residual  circumflex disease with left to right collaterals and a mid occluded  RCA.  Prior to removal, the patient was ambulating without difficulty  and did not have any further chest discomfort.  He received some Lasix  for mild volume overload.  Case care coordinator evaluated the patient  for any discharge needs.  Cardiac rehab assisted with education and  ambulation, and echocardiogram was performed and revealed an EF of 40-  45% with an anterior distal inferior inferoseptal and apical akinesis.  By the 13th, he was ambulating without difficulty.  He did not have any   further nonsustained ventricular tachycardia since postprocedure, and  Dr. Caryl Comes felt that the patient could be discharged home.      Sharyl Nimrod, PA-C      Deboraha Sprang, MD, [facility]  Electronically Signed    EW/MEDQ  D:  03/24/2008  T:  03/24/2008  Job:  [id number]   cc:   Loretha Brasil. Lia Foyer, MD, [facility]  Dr. Delena Bali

## 2010-11-24 NOTE — Procedures (Signed)
[facility]                              EXERCISE TREADMILL   NAME:[name], [name]                 MRN:          [id number]  DATE:06/13/2008                            DOB:          04-Aug-1923    Duration of exercise 6 minutes.  Maximum heart rate of 115% of PMHR is  83%.   COMMENT:  [name] is a delightful 77 year old, who underwent  percutaneous intervention for a MI.  He is continued to get along  reasonably well.  He has not had ongoing problems.  The patient has not  had symptoms since undergoing his PCI, and clearly does have advanced  age.   CURRENT MEDICATIONS:  Metoprolol, lisinopril, Plavix, Crestor, and an  aspirin.   The patient exercised today on the Bruce protocol.  At peak exercise, he  had no chest pain whatsoever at a heart rate of 115 which represents 83%  of his age predicted maximum.  His resting electrocardiogram  demonstrates evidence of an anteroseptal MI that involves lateral leads.  At peak exertion, there was about a millimeter of inferior and lateral  ST change, and biphasic T-waves in recovery, although the ST segments  did not persist and they did not get worse late in recovery.   RISKS SUMMATION:  This very nice gentleman presented in September.  He  was picked up into the field and found to have an anterior wall  infarction.  He had 2 non drug-eluting stents placed in the LAD.  His  overall ejection fraction was 45%.  The proximal 60% lesion was covered  with a 2.5-mm stent and the mid LAD lesion was covered with a 2.25-mm  stent.  The circumflex had a large marginal branch with diffuse 60%  narrowing.  There was a tiny subbranch with 90% narrowing and inferior  subbranch was 60% narrowing.  The RCA was totally occluded and there  were bridging collaterals to the distal right as well as left-to-right  collaterals.   The current exercise test does not demonstrate exercise-induced chest  pain.  He is on  an excellent medical regimen.  He has some borderline ST-  segment changes.  A radionuclide imaging study would likely be  misleading due to the large previous anteroseptal infarction.  Moreover,  he has a totally occluded right with collaterals.  This would likely  demonstrate ischemia as well.  Given the patient's age, we would likely  recommend continued medical therapy at this point as revascularization  surgery would have little to offer in the incidence of lack of any  angina or progressive symptomatology.     Loretha Brasil. Lia Foyer, MD, [facility]  Electronically Signed    TDS/MedQ  DD: 07/13/2008  DT: 07/13/2008  Job #: [id number]

## 2010-11-24 NOTE — H&P (Signed)
NAMEYUAN, ILGENFRITZ NO.:  [id number]   MEDICAL RECORD NO.:  [id number]          PATIENT TYPE:  INP   LOCATION:  2913                         FACILITY:  [place]   PHYSICIAN:  Loretha Brasil. Lia Foyer, MD, FACCDATE OF BIRTH:  04/09/1924   DATE OF ADMISSION:  03/21/2008  DATE OF DISCHARGE:                              HISTORY & PHYSICAL   PRIMARY CARDIOLOGIST:  New to [facility], been seen by Dr.  Lia Foyer.   PRIMARY CARE Kamylle Axelson:  Dr. Delena Bali in [place].   PATIENT PROFILE:  An 77 year old married Caucasian male without prior  cardiac history who presents with acute anterior ST segment elevation  myocardial infarction.   PROBLEM LIST:  1. Acute anterior ST segment elevation myocardial infarction.  2. History of bilateral knee replacement.  3. History of right carpal tunnel surgery.  4. History of bilateral trigger finger surgeries.   HISTORY OF PRESENT ILLNESS:  An 77 year old married Caucasian male  without prior cardiac history.  He was in his usual state of health for  approximately 10:30 a.m. today when he developed 10/10 lower sternal  epigastric pressure and pain associated with nausea and radiating to the  mid scapular area.  After approximately 45 minutes of continued  symptoms, he had worsening nausea and diaphoresis as well as vomiting  x1.  Following emesis, he felt slightly better although continued to  have persistent pain and pressure in his upper abdomen and after about  an hour and a half to two hours of ongoing symptoms, he called EMS.  ECG  upon arrival showed anterior ST segment elevation and a code-STEMI was  activated.  The patient was taken emergently to [facility]  arriving here at 13:32.  He continues to complain of 5/10 epigastric and  mid scapular pain.  Emergent catheterization is pending.   ALLERGIES:  No known drug allergies.   HOME MEDICATIONS:  None.   FAMILY HISTORY:  Mother died at 46 of unknown causes, his  father died at  49 of unknown causes, his 3 sisters all are deceased at least 2 are from  cancer and he is unsure about the other.   SOCIAL HISTORY:  He lives in [place], [place] with his wife.  He is a retired Horticulturist, commercial.  He does not exercise, but is active at  home.  He has 4 grown children.  He denies tobacco, alcohol, or drug use  ever in the lifetime.   REVIEW OF SYSTEMS:  Positive for chest and epigastric pain and pressure  associated with nausea, vomiting, and diaphoresis.  Otherwise, all  systems reviewed are negative.   PHYSICAL EXAMINATION:  VITAL SIGNS:  Temperature afebrile, heart rate  66, respirations 16, blood pressure 152/71, pulse ox 98% on 2 L.  GENERAL:  Pleasant white male in no acute distress, awake, alert, and  oriented x3.  HEENT:  Normal.  NEUROLOGIC:  Grossly intact.  Nonfocal.  SKIN:  Warm and dry without lesions or masses.  NECK:  No bruits or JVD.  LUNGS:  Respirations are regular and unlabored, clear to auscultation.  CARDIAC:  Regular S1 and S2, positive S4,  and no murmurs.  ABDOMEN:  Round, soft, nontender, and nondistended.  Bowel sounds  present x4.  EXTREMITIES:  Warm, dry, and pink.  No clubbing, cyanosis, or edema.  Dorsalis pedis and posterior tibial pulses 2+ and equal bilaterally.   ACCESSORY CLINICAL FINDINGS:  His creatinine is 1.1 by i-STAT.  EKG  shows sinus rhythm at a rate of 67 beats per minute.  He has inferior  Q's and 2-4 mm ST elevation in V2 through V6.   ASSESSMENT AND PLAN:  1. Acute anterior ST segment elevation myocardial infarction.  Plan,      emergent catheterization.  Add aspirin, Plavix, statin, beta-      blocker, and ACE inhibitors as blood pressure tolerates.  We will      plan interventional cardiac rehab.  2. Hypertension.  No history of hypertension.  In the setting of acute      anterior ST segment elevation mycoardial infarction, add beta-      blocker and follow.  3. Lipid status, currently  unknown.  Check lipids and LFTs, add high-      dose statin.      Murray Hodgkins, ANP      Loretha Brasil. Lia Foyer, MD, [facility]  Electronically Signed    CB/MEDQ  D:  03/21/2008  T:  03/22/2008  Job:  [id number]

## 2010-11-24 NOTE — Op Note (Signed)
NAMEPORTER, BUSTILLOS NO.:  [id number]   MEDICAL RECORD NO.:  [id number]          PATIENT TYPE:  AMB   LOCATION:  NESC                         FACILITY:  [facility]   PHYSICIAN:  Lauretta Grill, M.D.    DATE OF BIRTH:  [birth year]   DATE OF PROCEDURE:  08/31/2007  DATE OF DISCHARGE:                               OPERATIVE REPORT   ADDENDUM:  Make sure a copy of that operative report goes to Nicoletta Dress, primary care Seyed Heffley that may be in [place].           ______________________________  V. Hiram Comber, M.D.     VEP/MEDQ  D:  08/31/2007  T:  08/31/2007  Job:  [id number]

## 2010-11-24 NOTE — Op Note (Signed)
NAMEMAKOTO, FORSBERG NO.:  [id number]   MEDICAL RECORD NO.:  [id number]          PATIENT TYPE:  AMB   LOCATION:  NESC                         [facility]ACILITY:  [facility]   PHYSICIAN:  Lauretta Grill, M.D.    DATE OF BIRTH:  1924-06-07   DATE OF PROCEDURE:  08/31/2007  DATE OF DISCHARGE:                               OPERATIVE REPORT   PREOPERATIVE DIAGNOSIS:  Right carpal tunnel syndrome.   POSTOPERATIVE DIAGNOSIS:  Right carpal tunnel syndrome.   PROCEDURE:  Right carpal tunnel release.   SURGEON:  1. Hiram Comber, M.D.   ASSISTANT:  Billey Chang, P.A.-C.   ANESTHESIA:  General with LMA.   CULTURES:  None.   DRAINS:  None.   ESTIMATED BLOOD LOSS:  Minimal.   TOURNIQUET TIME:  22 minutes.   PATHOLOGIC FINDINGS AND HISTORY:  Axel is an 77 year old patient of  mine who I have done multiple orthopedic surgeries on.  The recent  complaint is both shoulder and bilateral carpal tunnel, right greater  than left, with failure of conservative methodology, splinting, an  injections and positive nerve conductions and EMGs on August 02, 2007  for severe right carpal tunnel and left side median mononeuropathy,  moderate.  The patient also may have some bilateral C5-6 radiculopathy.  He elected to proceed with surgical intervention.  At surgery, he had a  thick transverse carpal ligament with an hour glass compressed median  nerve.  Good release was obtained.   PROCEDURE:  With adequate anesthesia obtained using LMA technique, 1  gram Ancef given IV prophylaxis, the patient was placed in the supine  position.  The right upper extremity was prepped from fingertips to the  upper forearm in the standard fashion.  After standard prepping and  draping, Esmarch exsanguination was used.  The tourniquet was let up 250  mmHg.   An incision was then made at the base of the palm in the longitudinal  thenar flexion crease to the distal wrist flexor crease.  The incision  was  deepened sharply with the knife and hemostasis obtained using the  Bovie electrocoagulator.  Dissection was carried down through the skin,  and then we dissected to the palmar fascia.  I then used a loupe  magnification.  A Freer elevator was then placed underneath the  transverse carpal ligament, and it was cut down upon with a 27 Beaver  blade, exposing the nerve.  Careful neurolysis was then carried out with  tenotomy scissors, and the volar half of the epineurium was excised.  All branches were placed distally, including the motor branch.  I then  resected part of the transverse carpal ligament proximally and released  the remaining distal wrist flexor retinaculum on the ulnar side of the  wrist well up into the forearm.  The nerve was then palpated up  proximally and felt to be free.  Irrigation was carried out.  The wound  was then closed on the skin only with interrupted and running 4-0 nylon.  A bulky sterile compressive dressing was applied with volar plaster  splint in slight cock-up.   The patient then having tolerated  the procedure well, was awakened and  taken to the recovery room in satisfactory condition to be discharged  per outpatient routine, given Percocet for pain, and told to call the  office for an appointment for recheck on Monday.  Laboratory data within  normal limits.           ______________________________  V. Hiram Comber, M.D.     VEP/MEDQ  D:  08/31/2007  T:  09/01/2007  Job:  [id number]

## 2010-11-27 NOTE — Op Note (Signed)
[name], PITONES NO.:  [id number]   MEDICAL RECORD NO.:  [id number]          PATIENT TYPE:  INP   LOCATION:  0005                         FACILITY:  [facility]   PHYSICIAN:  Lauretta Grill, M.D.    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   PROCEDURE:  1. Revision right total knee with revision to all poly patella button.  2. Revision tibial poly tray.  3. Complete synovectomy.   SURGEON:  1. Hiram Comber, M.D.   ASSISTANT:  Billey Chang, P.A.-C.   ANESTHESIA:  General with femoral nerve block with LMA for general.   CULTURES:  None.   DRAINS:  Two medium Hemovac to self-suction.   ESTIMATED BLOOD LOSS:  150 mL, replaced without.   TOURNIQUET TIME:  52 minutes.   PATHOLOGIC FINDINGS AND HISTORY:  [name] is an 77 year old male who is  six years status post right total knee arthroplasty using rotating  platform with LCS components with the all metal back poly patella.  He  had a relatively recent left total knee with an all poly patella and has  done well with that.  The right knee has had some slow loss of range of  motion and, at one point, we knew that the poly button had spun but he  was not having particular difficulties with it.  However, he had the  acute onset of pain and we felt that the poly button had either come off  or spun more and his range of motion was decreased.   At surgery, the poly button had failed coming out of the groove and was  causing mechanical block.  He had intense polysynovitis which we  debrided, no sign of infection.  We did not even send the synovium for  pathology because there was no exudate and it  looked benign.  The tibial  tray had some wear, so we replaced it from a 12.5 to a 10, and this  seemed to provide full extension without hyperextension and continued  ligamentous stability after complete scar excision and synovectomy with  flexion to about 95 degrees, preoperatively only got about 45 degrees.  We did decide that he would have a better longevity with an all poly  patella and so we took off the metal backing. He had some excavation  medially in bone but we used cement to pack this area and got a secure  fixation of the all poly three prong patella, 35 mm, with good track  without lateral release.   DESCRIPTION OF PROCEDURE:  With adequate anesthesia obtained using LMA  technique and having had a right knee femoral nerve block, the patient  was placed in the supine position.  A Foley catheter was placed.  Standard prepping and draping of the right lower extremity was then  carried out and then the Esmarch exsanguination was used and the  tourniquet was inflated to 350 mmHg.   The old incision was incised, the incision deepened sharply with the  knife, and hemostasis obtained using the Bovie electrocoagulator.  A  flap was raised laterally subcutaneously sharply with a knife as thick  as possible, although he did not have very thick subcutaneum. The  patella was everted, but it was very tight and we did get a bit of  fibrous pull off proximally on the tubercle which we repaired later with  Mitek anchors. It was still attached distally.  We used three four  pronged Miteks to repair it.  It was not fully avulsed, it was simply  peeled off laterally and was still attached laterally.  In any case, we  excised the scar in the fat pad and there was a lot of heterotopic bone  around the old patella which we removed.  We then did a complete  synovectomy.  We removed the old poly button on the patella.  We then  used a saw to even out the patella and removed the metal backing to  have  a fresh service to implant the new poly patella.  I then took off the  spoke of the rotating platform with an osteotome, removed it, removed  all synovitis from the posterior gutters and did a posterior release to  allow [place] to last implant the smaller 10 from the 12.5.  We thoroughly  jet lavaged the knee and then, with some arduousness, we were able to  get the 10 mm rotating platform in place.   We then turned attention to the patella where we made the template holes  for the 35 mm all poly button.  We mixed cement, one batch with  tobramycin, and placed the cement in the more medial gaps and on the  patella button and impacted it down, removed excess cement, leaving some  cement medially and held it until the cement had cured.  Thorough jet  lavage was then carried out again.  When the cement had cured, the  tourniquet was let down, the bleeding points were cauterized.  Hemovac  drains were placed in the medial and lateral gutter and brought out the  superolateral portal.  The wound was then closed in layers with #1  figure-of-eight Vicryl interrupted, 0 Vicryl, and then 3-0 Vicryl  subcuticular.  Skin staples were then placed.  The Hemovac tubes were  then hooked up to self-suction.  A bulky sterile compressive dressing  was applied and a knee immobilizer placed.  The patient, having  tolerated the procedure well, was awakened and taken to the recovery  room in satisfactory condition for routine postoperative care and CPM.           ______________________________  V. Hiram Comber, M.D.     VEP/MEDQ  D:  11/15/2006  T:  11/15/2006  Job:  [id number]

## 2010-11-27 NOTE — Discharge Summary (Signed)
. [facility]  Patient:    [name], [name]                 MRN: [id number] Adm. Date:  [id number] Disc. Date: [id number] Attending:  Joylene Grapes Dictator:   Grace Bushy. Moye, P.A.-C. CC:         Nicoletta Dress, M.D., [place]   Discharge Summary  DIAGNOSIS:  Right knee end-stage degenerative joint disease.  PROCEDURES:  Right knee total knee arthroplasty using cemented Depuy LSC with rotating platform by Vernell Leep, M.D., on July 19, 2000.  MEDICATIONS AT DISCHARGE: 1. Tylox one to two every six hours as needed for pain. 2. Coumadin as directed by pharmacy x 4 weeks.  DISPOSITION:  The patient was discharged home with home health nursing and physical therapy.  He was to ambulate with weightbearing as tolerated with the assistance of a walker.  Follow-up appointment with Vernell Leep, M.D., 10 days from discharge.  HISTORY OF PRESENT ILLNESS:  This is a 77 year old male with a history of right knee pain with end-stage degenerative joint disease by x-rays.  Prior surgery.  He had pain with every step, pain at night, and was unable to walk distances.  HOSPITAL COURSE:  He was admitted and taken to the operating room on July 19, 2000, with procedure performed as above.  Postoperatively, he experienced nausea which lasted for approximately two days.  This resolved and he was able to tolerate a diet.  He advanced well with physical therapy and his pain was well controlled.  He was anticoagulated using Coumadin and was therapeutic with an INR of 2.1 on his day of discharge.  He was discharged home on medications and follow-up as above. DD:  08/13/00 TD:  08/15/00 Job: [id number]

## 2010-11-27 NOTE — Op Note (Signed)
NAME:  TAURUS, MAJKUT                    ACCOUNT NO.:  [id number]   MEDICAL RECORD NO.:  [id number]                   PATIENT TYPE:  INP   LOCATION:  2550                                 FACILITY:  [facility]   PHYSICIAN:  Lauretta Grill, M.D.                 DATE OF BIRTH:  10-Mar-1924   DATE OF PROCEDURE:  06/18/2003  DATE OF DISCHARGE:                                 OPERATIVE REPORT   PREOPERATIVE DIAGNOSIS:  End-stage degenerative joint disease left knee.   POSTOPERATIVE DIAGNOSIS:  End-stage degenerative joint disease left knee.   PROCEDURE:  Left total knee arthroplasty using cemented Depue components  with rotating platform, LCS type.   SURGEON:  Lynden Ang, M.D.   ASSISTANT:  Thomasenia Bottoms, P.A.-C.   ANESTHESIA:  Epidural and spinal - epidural.   CULTURES:  None.   DRAINS:  Two medium Hemovacs to Autovac.   ESTIMATED BLOOD LOSS:  100 cc.   REPLACEMENTS:  Doubt.   TOURNIQUET TIME:  One hour 29 minutes.   PATHOLOGIC FINDINGS AND HISTORY:  Mr. Griego is a 77 year old male who  has had longstanding bilateral knee DJD treated with cortisone injections  and other conservative modalities.  He underwent a right total knee  arthroplasty 07/19/00 where he had the same time procedure and has had an  excellent progress with this.  He is now having pain with every step, night  pain and pain with every step as well as cannot walk more than a city block,  so he decided to proceed with surgery.  He was bone-on-bone medially.  At  surgery, he had marked osteophytes.  He required a medial release for  ligament balance.  We sized him to a standard plus femur, left a standard  plus 15 mm insert, a size 5 tibia and a 38 mm patella with no need for  lateral retinacular release.  Ligament balance in flexion and extension and  range of motion at closure with a slight posterior sag just passed 0 with  flexion to 105 degrees.   PROCEDURE:  With adequate anesthesia obtained using  spinal technique and 1 g  Ancef given IV prophylaxis and another one at tourniquet let down, the  patient was placed in the supine position.  The left lower extremity was  prepped from the toes to the tourniquet in the standard fashion.  After  standard prepping and draping, Esmarch exsanguination was used and the  tourniquet let up to 350 mmHg.  A medium parapatellar skin incision was then  followed by a medium parapatellar retinacular incision.  The incision was  deepened and sharpened with the knife and hemostasis was obtained using the  Bovie electrocoagulator.  We then everted the patella, excised the  osteophytes, removed the fat pad, the menisci, both cruciates and planed off  with the saw the anterior tibial spine.  I then placed a tibial guide in  place and set it just  below the lowest medial side and made our initial cut.  After it was set, I then cut 5 mm more because he was very tight.  The knee  had a flexion contracture of at least 20 degrees.  We then sized to a  standard plus femur, put it in place, put the C-clamp with actually a 17.5  in place. We then did a medial release and balanced it on the medial tibia  and medial femur.  We then made our anterior posterior cuts and actually fit  best, fit with the 17.5 but on the actual implant, as you will see, a 15 mm  was better for flexion and extension.  In any case, the anterior posterior  cuts were made.  We then placed a 4 degree valgus cutting jig in place and  made that cut 5 mm more than usual to get full extension with the 17.5.  We  then placed the anterior posterior, chamfer and notch cutting jig in place.  I made those cuts as well as the far posterior cut.  We then exposed the  proximal tibia, sized it to a 5, put the template on, pinned it, drilled a  central peg hole as well as the broach and then trialed with the 17.5 and  the standard plus femur left and it was too tight an extension, so we sized  down to a 15  and it balanced well in flexion and extension with the 15 mm  rotating platform.  We then callipered the patella.  It measured 30.  We cut  it down to 19 and replaced it with a 38 mm with a 9.5 mm insert.  We felt  that the 41 mm with the 11 mm replacement was too proud and tight.  So we  used the 38 mm patella, drilled the peg holes, trialed it and it tracked and  fit nicely.  All trial components were removed and the knee was jet lavaged  while we checked components for sizing as they came on the field.  Two  batches of cement were mixed with Kenicef, two doses and cement gun.  We  then exposed the proximal tibia, cemented in the tibial component, impacted  it, removed excess cement.  We then put on the rotating platform insert, 15  mm.  We then cemented on the femoral component, impacted it, removed excess  cement.  We held the knee in full extension to further squeeze out cement  and then held it in 35 degrees of flexion while the cement cured.  We then  cemented on the femoral component, impacted it, removed excess cement and  held it with clamp until the cement was cured.  When the cement was cured,  the tourniquet was let down.  Bleeding points were cauterized.  Hemovac  drains were placed on the medial lateral gutter and brought out the  superolateral portal.  We further jet lavaged the knee.  The wound was then  closed in layers with #1 Vicryl on the retinaculum, 0 and 2-0 Vicryl on the  subcutaneous which was scant and skin staples.  A bulky sterile compressive  dressing was applied with the immobilizer.  Hemovacs were hooked up to  Autovac.  The patient was then taken to the recovery room to be admitted for  routine postoperative care.  CPM dosing of his epidural.  He was given the  additional gram of Ancef at tourniquet let down.  Lauretta Grill, M.D.    VEP/MEDQ  D:  06/18/2003  T:  06/19/2003  Job:  [id number]

## 2010-11-27 NOTE — Consult Note (Signed)
NAMEAMARDEEP, ZERBA NO.:  [id number]   MEDICAL RECORD NO.:  [id number]          PATIENT TYPE:  INP   LOCATION:  [place]                         FACILITY:  [facility]   PHYSICIAN:  Lucina Mellow. Terance Hart, M.D.DATE OF BIRTH:  Jun 11, 1924   DATE OF CONSULTATION:  11/18/2006  DATE OF DISCHARGE:                                 CONSULTATION   HISTORY:  I was asked to see this gentleman because he has gone into  urinary retention postoperatively.  He is a patient well known to me  with a history of bladder outlet obstructive symptoms without need for  therapy before this.  He had a Foley put in at surgery and was removed.  He failed a voiding trial because of very minimal voidings and a Foley  went back in.  He then apparently developed some spasms and leakage  around the catheter and the Foley was removed, and since then he is just  voiding small amounts and does not feel completely relived of his  symptoms.  He also has history of chronic PSA elevations, a chronic  microhematuria, and a past history of prostatitis.  He also has history  of benign renal cyst.   He is in general good health.  He has no serious medical problems.  He  has had knee surgery and cataract surgery before.  His medications on  admission included hydrocodone for pain.  He is on no other  cardiovascular drugs, etc.   Allergies to drugs are denied.   He does not smoke or drink alcohol.  Review of systems noted in health  history form and initialed by me.  His blood pressure is 137/53, he is  afebrile, pulse is 86, respiratory rate is 18.   He is alert and oriented.  Skin is warm and dry.  Appears stated age.  No hepatosplenomegaly, no hernias, but he has a distended-feeling  bladder on suprapubic examination.  Penis, urethral meatus, scrotum,  testicles and epididymides are unremarkable.  Prostate is smooth and  benign.   IMPRESSION:  1. Urinary retention.  2. Bladder neck contracture.  3. History of  microhematuria.  4. PSA elevations.  5. Renal cyst.   PLAN:  He needs to continue on Flomax and we will teach him in-and-out  catheterization so he can do self-catheterization at home as needed.  We  will teach him today and send him home tomorrow, and see me back in the  office in a week or two in followup.  He was given a prescription for  Flomax.      Lucina Mellow. Terance Hart, M.D.  Electronically Signed     LJP/MEDQ  D:  11/18/2006  T:  11/18/2006  Job:  [id number]

## 2010-11-27 NOTE — Op Note (Signed)
[place]  Patient:    [name], [name]                 MRN: [id number] Proc. Date: 07/19/00 Adm. Date:  [id number] Attending:  Joylene [name] CC:         [name], [place]                           Operative Report  PREOPERATIVE DIAGNOSIS:  End-stage degenerative joint disease, right knee.  POSTOPERATIVE DIAGNOSIS:  End-stage degenerative joint disease, right knee.  PROCEDURE:  Right total knee arthroplasty using cemented DePuy components, LCS type, with rotating platform.  SURGEON:  [name], M.D.  ASSISTANT:  [name]. [name], M.D., and Will _____, P.A.-C.  ANESTHESIA:  General endotracheal.  CULTURES:  None.  DRAINS:  Two medium Hemovacs and Autovac.  ESTIMATED BLOOD LOSS:  100 cc.  REPLACEMENT:  Without.  TOURNIQUET TIME:  1 hour 16 minutes.  PATHOLOGIC FINDINGS AND HISTORY:  [name] has had a long history of bilateral knee pain treated conservatively with anti-inflammatories and injections.  Ultimately he began to have pain with every step and could not walk more than a city block, and while x-rays revealed bone-on-bone both sides, he was much more symptomatic on the right side.  He desired to proceed with total knee arthroplasty.  At surgery, he had degenerative changes throughout the joint.  We achieved excellent ligament balance with standard plus right femur, a large plus tibia, standard plus to large 12.5 mm rotating platform, balancing in flexion in extension with a medial release carried out. We also used the standard +3 peg patella.  He had a flexion contracture of 20 degrees preoperatively, and we straightened him to slight hyperextension of maybe 1-2 degrees but still stable to varus-valgus stressing.  We used a 12.5 insert because he is young and very vigorous but did resect enough that we think we will have no problem with his flexion contracture.  He has very little  subcutaneous tissue, and we slightly under understuck the femur side because he really did not have much soft tissue relative to his relatively large femur.  There was essentially no subcutaneous tissue, just a very thin layer.  In any case, we achieved excellent fit and fill with the appropriate amount of range of motion, flexion to 115 degrees.  DESCRIPTION OF PROCEDURE:  With adequate anesthesia obtained using endotracheal technique, 1 g Ancef given IV for prophylaxis and another one at tourniquet let-down, the patient was placed in the supine position.  The right lower extremity was prepped from the toes to the tourniquet in standard fashion.  After standard prepping and draping, Esmarch exsanguination used. The tourniquet was let up to 350 mmHg.  A median parapatellar skin incision was then followed by a median parapatellar retinacular incision.  Incision deepened sharply with a knife and hemostasis obtained using the Bovie electrocoagulator.  The patella was everted, fat pad excised, medial release carried out, and the menisci and both cruciates were removed.  The knee was flexed.  The anterior spines were removed and the the intramedullary guide placed down the canal.  We then placed the tibial guide in place, a 10 degree posterior slant, and made our cut, and then cut 2.5 more.  We then sized to a standard plus femur, set our anterior-posterior cutting guide in place with a 12.5 insert with the appropriate rotation, set it, and  then made our anterior-posterior cuts.  We then fit in flexion a 12.5.  We then used the 4 degree valgus femoral cutting jig, put that in place, and actually made our cut 2.5 mm more after the initial cut with fitting of a 12.5 in slight extension, hyperextension maybe 1 to 2 degrees.  Still stable to varus-valgus stressing.  We then placed the anterior-posterior cutting jig, notch-cutting jig, and far posterior cutting jig all on the same and made those  cuts.  We then exposed the proximal tibia, sized to a large plus, placed the center peg hole template, and made our center hole.  We then placed the trial tibial tray, rotating platform 12.5, and the femoral trial.  The knee was then articulated and taken through a full range of motion.  We then measured the patella to a 30 on one end, 25 on the other.  It was a somewhat oddly-shaped patella, thicker inferiorly.  We then placed a clamp in place and made our cut down to 17.  This was somewhat of an average between the two with 10 mm taken out.  In any case, we made that cut and then placed the three-peg template, made our three holes, and placed the three-peg patella trial.  The knee was articulated, and range of motion was carried out without need for a lateral retinacular release.  All trial components were then removed while the components were checked for sizing as they came off the field.  Jet lavage was carried out.  Cement was then mixed with 750 mg of Zinacef per two batches. We then exposed the proximal tibia and cemented on the tibial component with some down the stem, impacted it, removed excess cement.  Rotating platform was then put in place.  We cemented on the femoral component, impacted it, removed excess cement, then hyperextended the knee to remove additional cement. Patellar component was then cemented on, impacted, and held with a clamp, removing excess cement, until the cement hardened.  When the cement had hardened, the tourniquet was let down and bleeding points were cauterized. Hemovac drains were placed in the medial and lateral gutters and brought out the superior lateral portal.  The knee was then closed in layers with #1 Vicryl on the retinaculum with a slight VMO advancement, 3-0 Vicryl in the subcutaneous tissue running, and skin staples.  A bulky sterile compressive dressing was applied with a knee immobilizer, and the patient tolerated the procedure well, was  awakened and taken to the recovery room in satisfactory condition for routine postoperative care and CPM. DD:  07/19/00 TD:  07/19/00 Job: [id number]

## 2010-11-27 NOTE — Discharge Summary (Signed)
NAME:  [name], [name]                    ACCOUNT NO.:  [id number]   MEDICAL RECORD NO.:  [id number]                   PATIENT TYPE:  INP   LOCATION:  5014                                 FACILITY:  [facility]   PHYSICIAN:  Lauretta Grill, M.D.                 DATE OF BIRTH:  05-Oct-1923   DATE OF ADMISSION:  06/18/2003  DATE OF DISCHARGE:  06/23/2003                                 DISCHARGE SUMMARY   ADMISSION DIAGNOSIS:  End-stage degenerative joint disease, left knee.   DISCHARGE DIAGNOSIS:  End-stage degenerative joint disease, left knee,  status post left total knee arthroplasty.   SUMMARY:  The patient was admitted to [facility] and taken to the  operating room on June 18, 2003, for a left total knee arthroplasty with  a Dupuy LCS rotating stem, cemented.  This was done under general anesthesia  with a tourniquet time of 1 hour and 27 minutes and an estimated blood loss  of 100 mL.  He was taken to the recovery room in stable condition.  On  June 19, 2003, postoperative day #1, he had had some nausea.  Fentanyl  epidural was discontinued.  The nausea was persisting.  Reglan was helping  him a little bit.  He was tolerating the CPM well.  His vitals were stable.  He had decreased bowel sounds and his abdomen was distended.  The knee  dressing was clean and dry.  He was neurovascularly intact on the left  distal extremity.  Postoperative laboratories had a sodium of 135, potassium  4.6, chloride 102, bicarbonate 30, glucose 170, calcium 7.9, BUN 17, and  creatinine 1.2.  Hemoglobin 11.1, hematocrit 32.4.  His INR was 1.4.  We are  continuing him per protocol.  PT, OT, and rehabilitation were consulted.  Coumadin and Lovenox per pharmacy protocol.  Zofran was added for nausea.  As the nausea did not significantly resolve, we are going to get a GI  consult because of his prior incidence of ileus.  On postoperative day #2,  June 20, 2003, he still had some nausea.   Slight improvement yesterday  after the epidural had been discontinued, but it was persistent.  Minimal to  moderate knee pain.  He not really been using the PCA at all.  His vital  signs were stable.  He was afebrile.  The temperature maximum was 100.1  degrees.  The knee wound was benign with minimal amounts of drainage of  about 25 mL in the drain over the last eight hours.  The drain was  discontinued.  The knee had no erythema, ecchymosis, or signs of infection.  His calf was soft and nontender.  He was neurovascularly intact.  His  abdomen was soft, but distended.  He had decreased bowel sounds in all four  quadrants.  The abdomen was nontender.  Serum magnesium and phosphorus were  ordered.  A KUB flat plate x-ray supine and  standing.  [place]  gastroenterologists were consulted.  Continuing PT and OT as written.  Continuing Lovenox and Coumadin.  The GI doctors did see him and thought  that he had a developing ileus.  A nasogastric tube was in place.  On  June 21, 2003, postoperative day #3, he had much less nausea.  He had  had some flatus, but still no bowel movements.  The NG tube was in good  position.  He was having a lot of frequent urination throughout the night,  minimal amounts of pain, and no real pain medication use.  His vitals were  stable with a maximum temperature of 100.3 degrees.  The dressing was clean  and dry.  The calf was soft and nontender.  He was neurovascularly intact.  The abdomen was soft, but less protuberant than the day before and still  nontender.  Hemoglobin 10.3, hematocrit 30.1.  PT 17.2 with an INR of 1.6.  Serum chemistries normal, except for a low sodium of 133, an elevated  glucose of 118, and a calcium of 8.1.  The plan was made that if he did well  and the nausea resolved, we are going to plan his discharge pending the  results of rehabilitation and PT consults.  We are anticipating other short  rehabilitation stay or discharge home with  home health PT depending on how  he did over the weekend.  On June 22, 2003, postoperative day #4, he had  had some bowel movements the day before.  He had much less decreased nausea  and no emesis.  His vitals were stable.  He was afebrile, awake, and alert.  The PT was 19.0 with an INR of 2.0.  The dressing was clean and dry.  He was  neurovascularly intact.  On June 23, 2003, he felt he was ready to go  home.  The nausea had resolved.  He was afebrile and vitals were stable.  The dressing was clean and dry.  The wound was benign.  The calf was soft  and nontender.  The plan to discharge him home if PT goals were met.   ACTIVITY:  Weightbearing as tolerated using the walker and knee immobilizer  as needed until clear by PT.   DIET:  Without restrictions.   WOUND CARE:  Dressing change daily by the home health nurse, keeping knee  clean and dry, and having staples out in two weeks.   MEDICATIONS UPON DISCHARGE:  1. Darvocet-N 100 one to two q.6h. for pain.  2. Coumadin to be monitored by [place].   FOLLOWUP INSTRUCTIONS:  Follow up in 7-10 days with Dr. Eddie Dibbles.  He was  instructed to call for an appointment.   CONDITION ON DISCHARGE:  Stable.      Thomasenia Bottoms, P.A.-C.                       Lauretta Grill, M.D.    AC/MEDQ  D:  07/02/2003  T:  07/03/2003  Job:  [id number]

## 2010-11-27 NOTE — Discharge Summary (Signed)
[name], [name] NO.:  [id number]   MEDICAL RECORD NO.:  [id number]          PATIENT TYPE:  INP   LOCATION:  1617                         [facility]ACILITY:  [facility]   PHYSICIAN:  Lauretta Grill, M.D.    DATE OF BIRTH:  1924-05-13   DATE OF ADMISSION:  11/15/2006  DATE OF DISCHARGE:  11/19/2006                               DISCHARGE SUMMARY   FINAL DIAGNOSES:  1. Patellar poly failure, status post right knee arthroplasty in the      past.  2. Urinary retention.  3. Benign prostatic hypertrophy.   PROCEDURES:  Exploration of right knee with replacement of patellar  button and poly exchange.   SURGEON:  Dr. Eddie Dibbles.   HISTORY:  An 77 year old white male who is status post a total right  knee arthroplasty that was performed on July 19, 2000.  He now  presented to [name] office with pain and inability to flex the right  knee.  Because of this he required surgical intervention with  exploration and replacement of the patellar button.  On Nov 15, 2006, the  patient was admitted to [facility] and underwent an  exploration of right knee with replacement of the patellar button and a  poly spacer.  Patient tolerated the procedure well.  No intraoperative  complications occurred.  Postoperatively the patient did well.  He did  have postoperative anemia, which remained stable throughout his course.  Pain was well controlled.  He did have problems after the Foley was  discontinued; he could not void.  He was in urinary retention.  Subsequently Dr. Terance Hart, his urologist, was consulted and saw the  patient on Nov 18, 2006.  At this point he was on void trial, and he had  been started on Flomax.  On the third postoperative day he was doing  well.  He was on a void trial at this point, and so far has not been  able to void on his own.  He was otherwise without any other complaints.  As far as orthopedic standpoint was going he was doing very well, and he  was able  to walk without difficulty without assistance except for  walker.  At this point he was prepared for discharge.   At the time of discharge his medications are Lovenox 30 mg one subcu  q.4h. and Percocet 1-2 p.o. q.4-6h. p.r.n. for pain, and he will  probably go home on Flomax, he is currently no Flomax 0.4 mg daily; this  may be adjusted by urologist prior to discharge.   PHYSICAL EXAMINATION:  GENERAL:  At the time of discharge, the patient  is a well-developed, well-nourished 77 year old white male, alert,  oriented, cooperative.  HEENT:  [place]/AT.  EOMI.  PERRL.  Oropharynx is clear.  Mucous membranes  are pink and moist.  NECK:  Supple without JVD, lymphadenopathy or thyromegaly.  No carotid  bruits noted.  CHEST:  Symmetrical inspiration.  Clear to auscultation.  No wheezes,  rhonchi or rales noted.  CV:  Regular rate and rhythm without murmur, rub or gallop.  ABDOMEN:  Soft, flat, nontender.  No palpable  pulsatile masses.  No HSM.  No hernias.  GU:  Deferred.  RECTAL:  Deferred.  EXTREMITIES:  Without clubbing, cyanosis or edema.  There is a well-  healing surgical scar on the right knee.  Peripheral pulses intact.  NEURO:  Grossly intact.   PLAN:  Patient is doing well at this time.  He is ready for discharge.  He will go home on Lovenox, for which he will get a teaching kit to do  subcutaneous injections once every 12 hours for eight days, and he gets  Percocet for pain.  Other than that, he may be on Flomax; we will let  that be taken care of by Dr. Terance Hart, his urologist, prior to  discharge.  We are ready to let him go.  He will be discharged on Nov 19, 2006, if okay with Dr. Terance Hart.  At that time patient will be  discharged in satisfactory and stable condition.      Billey Chang, P.A.    ______________________________  Clayton Bibles. Hiram Comber, M.D.    CL/MEDQ  D:  11/18/2006  T:  11/18/2006  Job:  [id number]

## 2010-11-27 NOTE — Procedures (Signed)
[place]  Patient:    [name], Alan                 MRN: [id number] Proc. Date: 07/19/00 Adm. Date:  [id number] Attending:  Joylene Grapes                           Procedure Report  PROCEDURE:  Epidural catheter placement for postoperative epidural fentanyl pain control.  ANESTHESIOLOGIST:  Leda Quail, M.D.  INDICATIONS:  Prior to the procedure Dr. Finis Bud discussed with the patient all risks and benefits of postoperative epidural fentanyl pain control.  The patient elected to proceed with the technique after Dr. Vernell Leep, his primary surgeon, discussed it and recommended it for him for postoperative pain control.  DISPOSITION:  The catheter will be left in place until effective anticoagulation is started.  DESCRIPTION OF PROCEDURE:  After the surgical dressing was applied, the patient was turned to the full right lateral decubitus position.  Betadine prep x 3 was performed on the lumbar region.  A sterile technique was used. Then a #17 gauge Tuohy needle was inserted in the L3-4 interspace using air loss of resistance and a midline approach.  The epidural space was located on the first pass.  There was no blood or CSF noted.  A benign styletted catheter was then placed 6.0 cm into the epidural space, and the needle was removed. There was again a negative aspiration for blood or CSF through the catheter. Next, a preservative-free Xylocaine 1%, 5 cc with _____ normal saline 10 cc and 100 mcg of fentanyl was then slowly injected into the epidural catheter. The catheter was secured to the patients back using Hypafix dressing and a 2 x 2 gauze over the insertion site.  The patient was then awakened and extubated and taken to the recovery room in good condition, where he was placed on a continuous infusion of 5 mcg per cc of epidural fentanyl, at an initial rate of 14 cc per hour.  He will be evaluated over the next  approximately 1-1/2 days, until anticoagulation is instituted for adequacy of pain control. DD:  07/19/00 TD:  07/19/00 Job: [id number]

## 2010-11-27 NOTE — Assessment & Plan Note (Signed)
[facility] OFFICE NOTE   NAME:[name]                 MRN:          [id number]  DATE:04/08/2008                            DOB:          August 19, 1923    [name] is in today for followup.  In general, he is doing quite  well.  He says he feels no adverse effects from having been treated for  heart attack.  He has actually been resuming most of his activities, and  perhaps even some we had hoped that he would not.  He has not had any  chest pain whatsoever.  He said he realized he was really having a heart  attack and so his presentation was somewhat delayed.  Since discharge  from the hospital, he has done well and not had any recurrent symptoms.  The patient underwent percutaneous intervention of the left anterior  descending artery in both the midportion, which was stented.  A 2.25 x  28 mini Vision was used in the mid vessel and a 2.5 x 12 mini Vision was  used more proximally.  These areas were then postdilated with a 2.75-mm  balloon. He had an 80-90% distal stenosis which was dilated only with a  2.25-mm balloon.  Left ventriculography done in the RAO projection  revealed hypokinesis of the anteroapical segment with an ejection  fraction of 45%.  He had moderate plaquing of the circumflex, and total  occlusion of the right with evidence of bridging collaterals.  Since  discharge from the hospital he said, he has gotten along well.   CURRENT MEDICATIONS:  1. Plavix 75 mg daily.  2. Metoprolol tartrate 25 mg one half tablet b.i.d.  3. Crestor 40 mg daily.  4. Lisinopril 5 mg daily.  5. Astepro nasal spray daily.  6. He is also taking 325 mg of aspirin daily.   LABORATORY DATA:  The patient has also had followup laboratory studies  done.  These were done by Dr. Delena Bali.  His BUN was 24, creatinine 1.26,  and potassium 4.2.  TSH was 2.95.  Hemoglobin and hematocrit were  normal.  A platelet  count was low which is chronic finding at 124,000.   PHYSICAL EXAMINATION:  GENERAL:  He is alert and oriented.  VITAL SIGNS:  The weight is 166 pounds, blood pressure 100/60, pulse 62.  LUNGS:  Lung fields clear.  CARDIAC:  Rhythm is regular with soft S4 gallop.  The groin site is well-healed.   Electrocardiogram demonstrates normal sinus rhythm.  There are Qs in the  anterior precordial leads with slight QRS widening.  There is also  inferior Qs with T-wave inversion also noted in I and AVL.  These are  compatible with anterolateral and inferior infarct of indeterminate age.   IMPRESSION:  1. Acute myocardial infarction due to left anterior descending      occlusion with successful percutaneous coronary artery      intervention.  2. Chronic total occlusion of the right coronary artery with evidence      of bridging collaterals.  3. Moderate reduction in overall left ventricular function with  infero-      and anteroapical wall motion abnormalities and modestly reduced      overall ejection fraction.  4. Advanced age.  5. Hypercholesterolemia, on lipid lowering therapy.   RECOMMENDATIONS:  1. The patient will return to clinic in about 4-6 weeks.  At this time      we can consider doing an exercise stress test.  2. Continued aggressive management of hypercholesterolemia with      followup with Dr. Delena Bali.  3. We have allowed him to return to driving, but I have discussed with      him some moderation of his overall activity.  4. We have talked about his current situation.     [name]. Lia Foyer, MD, [facility]  Electronically Signed    TDS/MedQ  DD: 04/09/2008  DT: 04/10/2008  Job #: [id number]

## 2010-12-08 ENCOUNTER — Encounter: Payer: Self-pay | Admitting: Cardiology

## 2010-12-18 ENCOUNTER — Ambulatory Visit (INDEPENDENT_AMBULATORY_CARE_PROVIDER_SITE_OTHER): Payer: Medicare Other | Admitting: Cardiology

## 2010-12-18 ENCOUNTER — Encounter: Payer: Self-pay | Admitting: Cardiology

## 2010-12-18 VITALS — BP 130/78 | HR 48 | Ht 71.0 in | Wt 171.0 lb

## 2010-12-18 DIAGNOSIS — I2510 Atherosclerotic heart disease of native coronary artery without angina pectoris: Secondary | ICD-10-CM

## 2010-12-18 DIAGNOSIS — I10 Essential (primary) hypertension: Secondary | ICD-10-CM

## 2010-12-18 DIAGNOSIS — IMO0002 Reserved for concepts with insufficient information to code with codable children: Secondary | ICD-10-CM

## 2010-12-18 DIAGNOSIS — E7800 Pure hypercholesterolemia, unspecified: Secondary | ICD-10-CM

## 2010-12-18 NOTE — Patient Instructions (Signed)
Your physician wants you to follow-up in: 6 MONTHS.  You will receive a reminder letter in the mail two months in advance. If you don't receive a letter, please call our office to schedule the follow-up appointment.  Your physician recommends that you continue on your current medications as directed. Please refer to the Current Medication list given to you today.  

## 2010-12-19 NOTE — Progress Notes (Signed)
HPI:  [name] returns for a follow up visit.  He denies any chest pain. He really is doing pretty well.  He went to see Dr. Eddie Dibbles, and he has been taking Celebrex, which quite frankly, has made a dramatic difference in how he feels. He came off of it once before, and had the recurrence of the same issue.  We talked about his medication in some detail today, the various risks involved with the regimens, and some of the alternatives.  We also discussed the issue of quality of life and some of the medication choice options that one has to make.  He has had no significant cardiac symptoms that we can tell.   Current Outpatient Prescriptions  Medication Sig Dispense Refill  . aspirin 81 MG EC tablet Take 81 mg by mouth daily.        . celecoxib (CELEBREX) 200 MG capsule Take 200 mg by mouth daily.        . metoprolol tartrate (LOPRESSOR) 25 MG tablet Take 12.5 mg by mouth 2 (two) times daily.       . pregabalin (LYRICA) 75 MG capsule Take 75 mg by mouth as needed.        . Red Yeast Rice Extract 600 MG CAPS Take by mouth. Take once capsule once daily         Allergies  Allergen Reactions  . Rosuvastatin     Past Medical History  Diagnosis Date  . Myocardial infarct 03/21/2008  . PCI (pneumatosis cystoides intestinalis)   . Coronary atherosclerosis of native coronary artery   . Pure hypercholesterolemia   . Phlebitis and thrombophlebitis of superficial vessels of lower extremities   . Gross hematuria   . Cough     Past Surgical History  Procedure Date  . Replacement total knee bilateral   . Carpal tunnel release   . Trigger finger surgery     Family History  Problem Relation Age of Onset  . Cancer Sister   . Cancer Sister     History   Social History  . Marital Status: Married    Spouse Name: N/A    Number of Children: N/A  . Years of Education: N/A   Occupational History  . Not on file.   Social History Main Topics  . Smoking status: Never Smoker   . Smokeless  tobacco: Not on file  . Alcohol Use: No  . Drug Use: No  . Sexually Active: Not on file   Other Topics Concern  . Not on file   Social History Narrative   Lives in [place] w/ wife. 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 Denies tobacco, alcohol, or drug use ever in lifetime.    ROS: Please see the HPI.  All other systems reviewed and negative.  PHYSICAL EXAM:  BP 130/78  Pulse 48  Ht 5\' 11"  (1.803 m)  Wt 171 lb (77.565 kg)  BMI 23.85 kg/m2  General: Well developed, well nourished, in no acute distress. Head:  Normocephalic and atraumatic. Neck: no JVD Lungs: Clear to auscultation and percussion. Heart: Normal S1 and S2.  No murmur, rubs or gallops.  Abdomen:  Normal bowel sounds; soft; non tender; no organomegaly Pulses: Pulses normal in all 4 extremities. Extremities: No clubbing or cyanosis. No edema. Neurologic: Alert and oriented x 3.  EKG:  SB, rate 48.  LAD.  IRBBB.  LAFB. Anterior MI, old  ASSESSMENT AND PLAN:

## 2010-12-20 DIAGNOSIS — IMO0002 Reserved for concepts with insufficient information to code with codable children: Secondary | ICD-10-CM

## 2010-12-20 HISTORY — DX: Reserved for concepts with insufficient information to code with codable children: IMO0002

## 2010-12-20 NOTE — Assessment & Plan Note (Signed)
Remains on Celebrex.  I get the sense that he is dysfunctional without it.  He does understand the issue of Cox inhibition and cardiac risk, but the difference it has made has been substantial.  He is well informed on the issues to make a patient call on risk.

## 2010-12-20 NOTE — Assessment & Plan Note (Signed)
Reasonable control on beta blockers.  Would not alter at this point.

## 2010-12-20 NOTE — Assessment & Plan Note (Signed)
See EPIC notes about prior cath.  Has PCI of LAD in setting of AMI.  Currently on ASA.  Had hematuria on plavix, followed by Dr. Terance Hart.  Would continue current medications.

## 2010-12-20 NOTE — Assessment & Plan Note (Signed)
Uses red yeast rice, does not take statins, and is followed by Dr. Delena Bali.

## 2011-02-08 ENCOUNTER — Encounter: Payer: Self-pay | Admitting: Cardiology

## 2011-04-14 LAB — TSH: TSH: 1.381

## 2011-04-14 LAB — BASIC METABOLIC PANEL
BUN: 14
Chloride: 102
Chloride: 103
Creatinine, Ser: 1.17
GFR calc Af Amer: >60
GFR calc non Af Amer: 57 — ABNORMAL LOW
Glucose, Bld: 103 — ABNORMAL HIGH
Potassium: 4.1
Sodium: 140

## 2011-04-14 LAB — COMPREHENSIVE METABOLIC PANEL
ALT: 35
AST: 164 — ABNORMAL HIGH
Alkaline Phosphatase: 64
CO2: 29
GFR calc non Af Amer: >60
Glucose, Bld: 111 — ABNORMAL HIGH
Potassium: 3.9
Sodium: 137
Total Protein: 5.7 — ABNORMAL LOW

## 2011-04-14 LAB — CARDIAC PANEL(CRET KIN+CKTOT+MB+TROPI)
CK, MB: 139.7 — ABNORMAL HIGH
CK, MB: 278.3 — ABNORMAL HIGH
Relative Index: 16.5 — ABNORMAL HIGH
Relative Index: 17.6 — ABNORMAL HIGH
Relative Index: 19.5 — ABNORMAL HIGH
Troponin I: 46.94

## 2011-04-14 LAB — POCT I-STAT, CHEM 8
BUN: 17
Chloride: 102
Creatinine, Ser: 1.1
Glucose, Bld: 136 — ABNORMAL HIGH
Potassium: 3.7

## 2011-04-14 LAB — CBC
HCT: 39.6
HCT: 39.8
HCT: 42.4
Hemoglobin: 13.4
Hemoglobin: 13.5
Hemoglobin: 14.1
MCV: 85.5
MCV: 85.7
MCV: 86.3
Platelets: 109 — ABNORMAL LOW
RBC: 4.63
RBC: 4.64
RBC: 4.65
RDW: 13.9
WBC: 6.7
WBC: 7.2

## 2011-04-14 LAB — MAGNESIUM: Magnesium: 1.8

## 2011-04-14 LAB — LIPID PANEL
Cholesterol: 132
LDL Cholesterol: 94
Triglycerides: 65
VLDL: 13

## 2011-05-25 DIAGNOSIS — I5030 Unspecified diastolic (congestive) heart failure: Secondary | ICD-10-CM | POA: Insufficient documentation

## 2011-05-25 DIAGNOSIS — I25709 Atherosclerosis of coronary artery bypass graft(s), unspecified, with unspecified angina pectoris: Secondary | ICD-10-CM | POA: Insufficient documentation

## 2011-06-07 ENCOUNTER — Telehealth: Payer: Self-pay | Admitting: Cardiology

## 2011-06-07 NOTE — Telephone Encounter (Signed)
Pt had a bad accident and was at [facility] and just got discharged and needs to see Dr. Lia Foyer this week

## 2011-06-07 NOTE — Telephone Encounter (Signed)
I spoke with the pt's daughter and she said the pt has been in [facility] for a couple of weeks.  [place] mower when a belt broke and the pt and lawnmower went over the edge and down the mountain.  The pt was air lifted to [facility] in critical condition.  The pt broke multiple bones (facial, sternum and femur).  The pt had knee and leg surgery.  The pt was discharged on Friday and instructed to follow-up with Dr Lia Foyer for some heart issues.  I attempted to schedule the pt with the PA but the daughter only wants to see Dr Lia Foyer. I made the pt's daughter aware that the pt is scheduled to see Dr Lia Foyer next week on 06/16/11.  We will keep that appointment at this time and she will get a release of information signed so that we can obtain the pt's records.  She will call back if the pt develops any problems that need to be evaluated this week.

## 2011-06-08 ENCOUNTER — Telehealth: Payer: Self-pay | Admitting: Cardiology

## 2011-06-08 NOTE — Telephone Encounter (Addendum)
ROI faxed to [facility] @ [phone]  06/08/11/km  Release re faxed to [facility], Records were received today  Gave to [facility] 06/16/11/KM  Received ALL records from [facility] gave to [place] 07/08/11/KM

## 2011-06-09 DIAGNOSIS — N401 Enlarged prostate with lower urinary tract symptoms: Secondary | ICD-10-CM

## 2011-06-09 DIAGNOSIS — R319 Hematuria, unspecified: Secondary | ICD-10-CM | POA: Insufficient documentation

## 2011-06-09 HISTORY — DX: Benign prostatic hyperplasia with lower urinary tract symptoms: N40.1

## 2011-06-14 ENCOUNTER — Telehealth: Payer: Self-pay | Admitting: Cardiology

## 2011-06-14 NOTE — Telephone Encounter (Signed)
I spoke with [name] and she is concerned because the pt's BP is running low today.  BP 90/50 unsure of pulse.  I had [name] review the pt's medication with me and the only cardiac med that he is taking is Metoprolol Tartrate 25mg  one-half tablet by mouth twice a day.  The pt overall feels okay.  I recommended that the pt increase his fluid intake and hold Metoprolol Tartrate.  I will move the pt's appointment up to tomorrow with Dr Lia Foyer.  Anne agreed with plan.

## 2011-06-14 NOTE — Telephone Encounter (Signed)
New Msg : Pt daughter calling wanting to see if Dr. Lia Foyer can see pt sooner. Pt BP is really low and family is worried. Please return pt call to discuss further.

## 2011-06-15 ENCOUNTER — Ambulatory Visit (INDEPENDENT_AMBULATORY_CARE_PROVIDER_SITE_OTHER): Payer: Medicare Other | Admitting: Cardiology

## 2011-06-15 ENCOUNTER — Other Ambulatory Visit: Payer: Self-pay | Admitting: Cardiology

## 2011-06-15 ENCOUNTER — Encounter: Payer: Self-pay | Admitting: Cardiology

## 2011-06-15 VITALS — BP 138/70 | HR 90 | Ht 71.0 in | Wt 155.4 lb

## 2011-06-15 DIAGNOSIS — R339 Retention of urine, unspecified: Secondary | ICD-10-CM

## 2011-06-15 DIAGNOSIS — I2510 Atherosclerotic heart disease of native coronary artery without angina pectoris: Secondary | ICD-10-CM

## 2011-06-15 NOTE — Patient Instructions (Addendum)
Drink plenty of fluid.  Your physician has recommended you make the following change in your medication: Metoprolol Tartrate 25mg  take one-fourth tablet by twice a day  Your physician recommends that you schedule a follow-up appointment in: [place] given for to pt to get a BMP and CBC drawn today at [facility].

## 2011-06-15 NOTE — Progress Notes (Signed)
HPI:  He is in for follow up.  He was admitted to [facility] after losing control of his lawn mower and going down a hill.  He develop a knee issue, had a shoulder injury, sternum, and three ribs.  He is slowly recovering.  He could not pass water, and is getting cathed four times daily.  He is being followed at [facility] and is seeing Urology on Thursday.  He has been lightheaded when standing, and he has been on Flomax.  He denies any chest pain.  They kept him an extra two days because of orthostatic hypotension, per patient.  Not coughing at present.  His beta blocker has been held when his home BP was 90.  He was on 25 mg-- 1/2 BID.    Current Outpatient Prescriptions  Medication Sig Dispense Refill  . aspirin 81 MG EC tablet Take 81 mg by mouth daily.        . celecoxib (CELEBREX) 200 MG capsule Take 200 mg by mouth daily.        . polyethylene glycol (MIRALAX / GLYCOLAX) packet Take 17 g by mouth as needed.        . pregabalin (LYRICA) 75 MG capsule Take 75 mg by mouth as needed.        . Tamsulosin HCl (FLOMAX) 0.4 MG CAPS Take 0.4 mg by mouth daily.          Allergies  Allergen Reactions  . Rosuvastatin     Past Medical History  Diagnosis Date  . Myocardial infarct 03/21/2008  . PCI (pneumatosis cystoides intestinalis)   . Coronary atherosclerosis of native coronary artery   . Pure hypercholesterolemia   . Phlebitis and thrombophlebitis of superficial vessels of lower extremities   . Gross hematuria   . Cough     Past Surgical History  Procedure Date  . Replacement total knee bilateral   . Carpal tunnel release   . Trigger finger surgery     Family History  Problem Relation Age of Onset  . Cancer Sister   . Cancer Sister     History   Social History  . Marital Status: Married    Spouse Name: N/A    Number of Children: N/A  . Years of Education: N/A   Occupational History  . Not on file.   Social History Main Topics  . Smoking status: Never Smoker   .  Smokeless tobacco: Not on file  . Alcohol Use: No  . Drug Use: No  . Sexually Active: Not on file   Other Topics Concern  . Not on file   Social History Narrative   Lives in [place] w/ wife. 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 Denies tobacco, alcohol, or drug use ever in lifetime.    ROS: Please see the HPI.  All other systems reviewed and negative.  PHYSICAL EXAM:  BP 138/70  Pulse 90  Ht 5\' 11"  (1.803 m)  Wt 70.489 kg (155 lb 6.4 oz)  BMI 21.67 kg/m2  BP by me 120/70 lying, but came up to [id number] systolic with standing.  HR rose as well.   General: Thin elderly man in no acute distress. Head:  Normocephalic and atraumatic. Neck: no JVD Lungs: Clear to auscultation and percussion. Heart: Normal S1 and S2.  No murmur, rubs or gallops.  Abdomen:  Normal bowel sounds; soft; non tender; no organomegaly Pulses: Pulses normal in all 4 extremities. Extremities: Knee incision. Neurologic: Alert and oriented x 3.  EKG:  NSR.[name]  Anteroseptal MI, with incomplete RBBB.    ASSESSMENT AND PLAN:

## 2011-06-16 ENCOUNTER — Ambulatory Visit: Payer: Medicare Other | Admitting: Cardiology

## 2011-06-16 LAB — CBC
HCT: 32.3 % — ABNORMAL LOW (ref 39.0–52.0)
Hemoglobin: 10.4 g/dL — ABNORMAL LOW (ref 13.0–17.0)
MCH: 28.9 pg (ref 26.0–34.0)
MCHC: 32.2 g/dL (ref 30.0–36.0)
RDW: 14.8 % (ref 11.5–15.5)

## 2011-06-16 LAB — BASIC METABOLIC PANEL
BUN: 27 mg/dL — ABNORMAL HIGH (ref 6–23)
Calcium: 9.3 mg/dL (ref 8.4–10.5)
Glucose, Bld: 102 mg/dL — ABNORMAL HIGH (ref 70–99)
Potassium: 5.2 mEq/L (ref 3.5–5.3)
Sodium: 135 mEq/L (ref 135–145)

## 2011-06-20 DIAGNOSIS — R338 Other retention of urine: Secondary | ICD-10-CM | POA: Insufficient documentation

## 2011-06-20 DIAGNOSIS — R339 Retention of urine, unspecified: Secondary | ICD-10-CM

## 2011-06-20 HISTORY — DX: Retention of urine, unspecified: R33.9

## 2011-06-20 NOTE — Assessment & Plan Note (Addendum)
Currently is getting cathed four times daily and getting Flomax.  Hope he will be able to come off of flomax.  We will encourage PO fluids.  He is not orthostatic on exam today.   Check CBC and BMET./

## 2011-06-20 NOTE — Assessment & Plan Note (Signed)
Despite all he has been through, this seems to be stable.  He will continue on medical therapy.  He is on an aspirin alone, low dose beta blockers, and does not want statins.  He is not orthostatic by exam, but importantly is getting lightheaded.  This likely relates to the Flomax, used because of urinary retention.  Hopefully he can come off of this soon. His beta blockers were held yesteday because of his BP, but we will resume at low dose, basically one fourth tab (1/4 of 25 mg) twice daily. We will see how he does with this.

## 2011-06-22 ENCOUNTER — Telehealth: Payer: Self-pay | Admitting: Cardiology

## 2011-06-22 ENCOUNTER — Other Ambulatory Visit: Payer: Self-pay

## 2011-06-22 MED ORDER — NITROGLYCERIN 0.4 MG SL SUBL: 0.4000 mg | SUBLINGUAL_TABLET | SUBLINGUAL | Status: DC | PRN

## 2011-06-22 NOTE — Telephone Encounter (Signed)
New Msg: pt daughter calling stating that pt needs NITRO called into [facility] in [place]. Please return pt daughter call to discuss further if necessary.

## 2011-06-22 NOTE — Telephone Encounter (Signed)
Pt.notified

## 2011-06-22 NOTE — Telephone Encounter (Signed)
Ordered

## 2011-07-03 IMAGING — CR DG ANKLE PORT 2V*L*
3 series · 3 of 3 positions shown · non-contrast
Comparison: None.

CLINICAL DATA: Fall.

PORTABLE LEFT ANKLE - 2 VIEW

[view not recorded (1 of 3)]
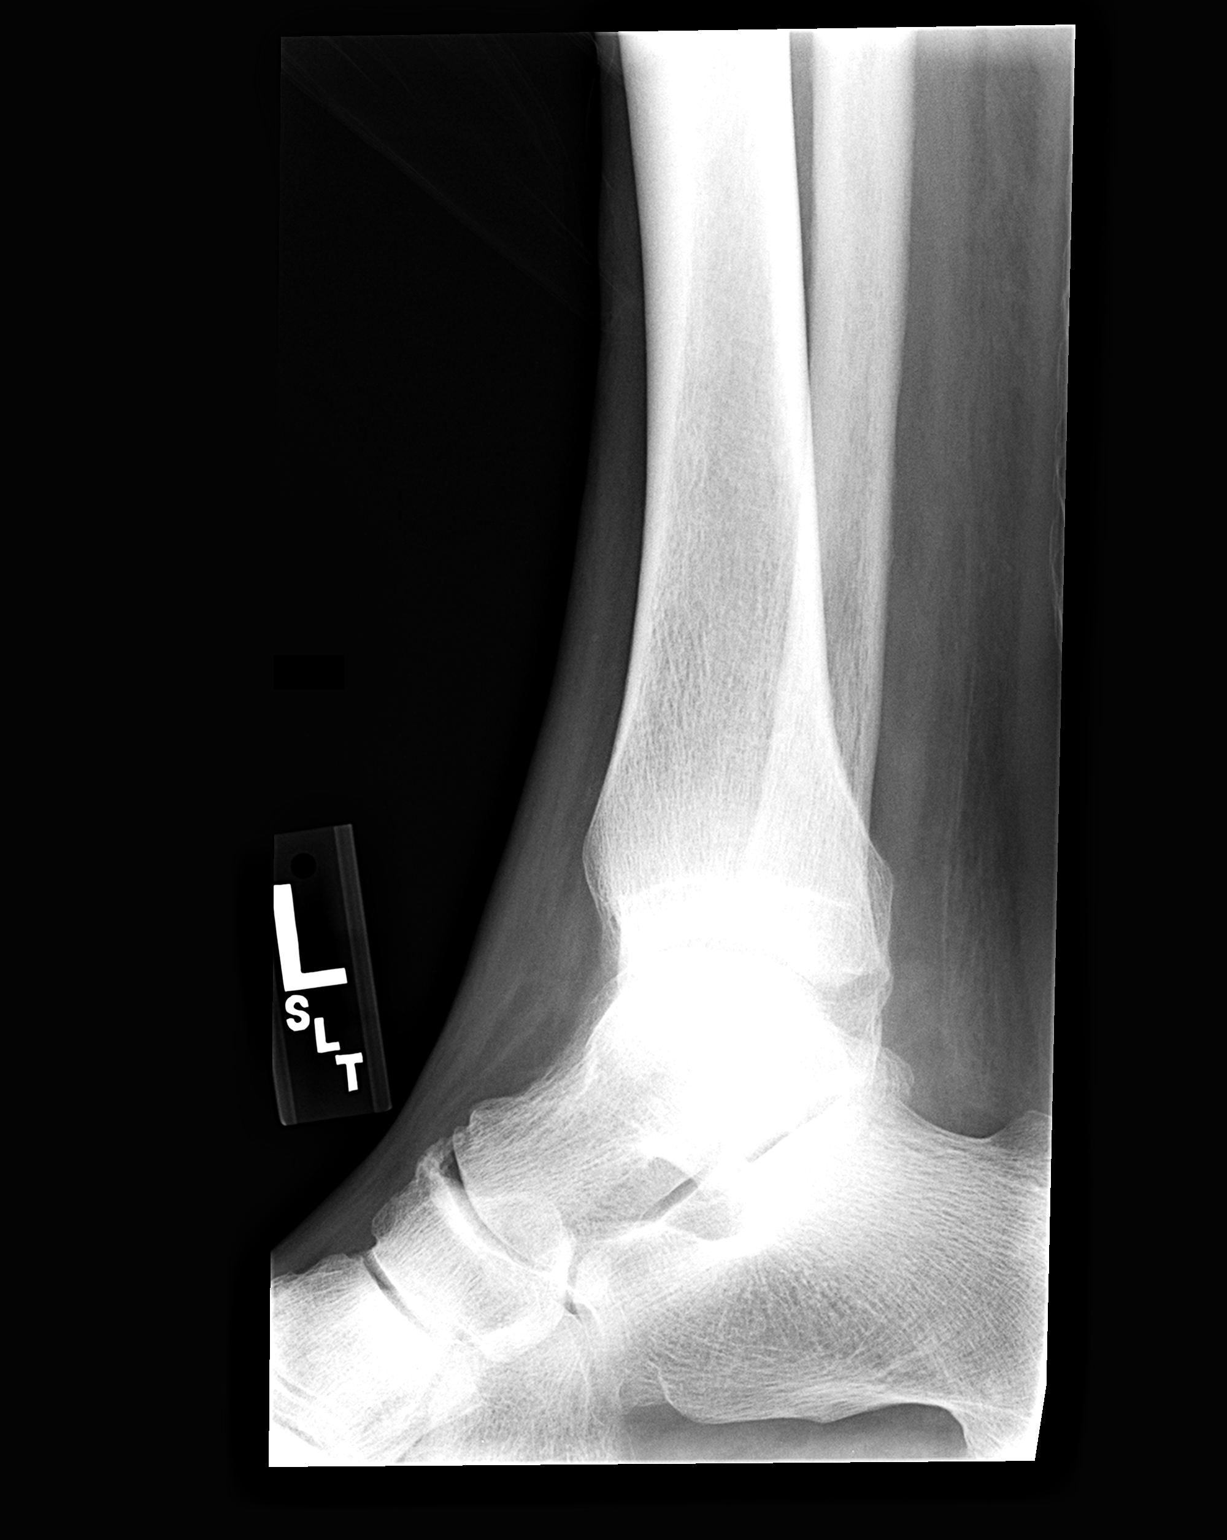

[view not recorded (2 of 3)]
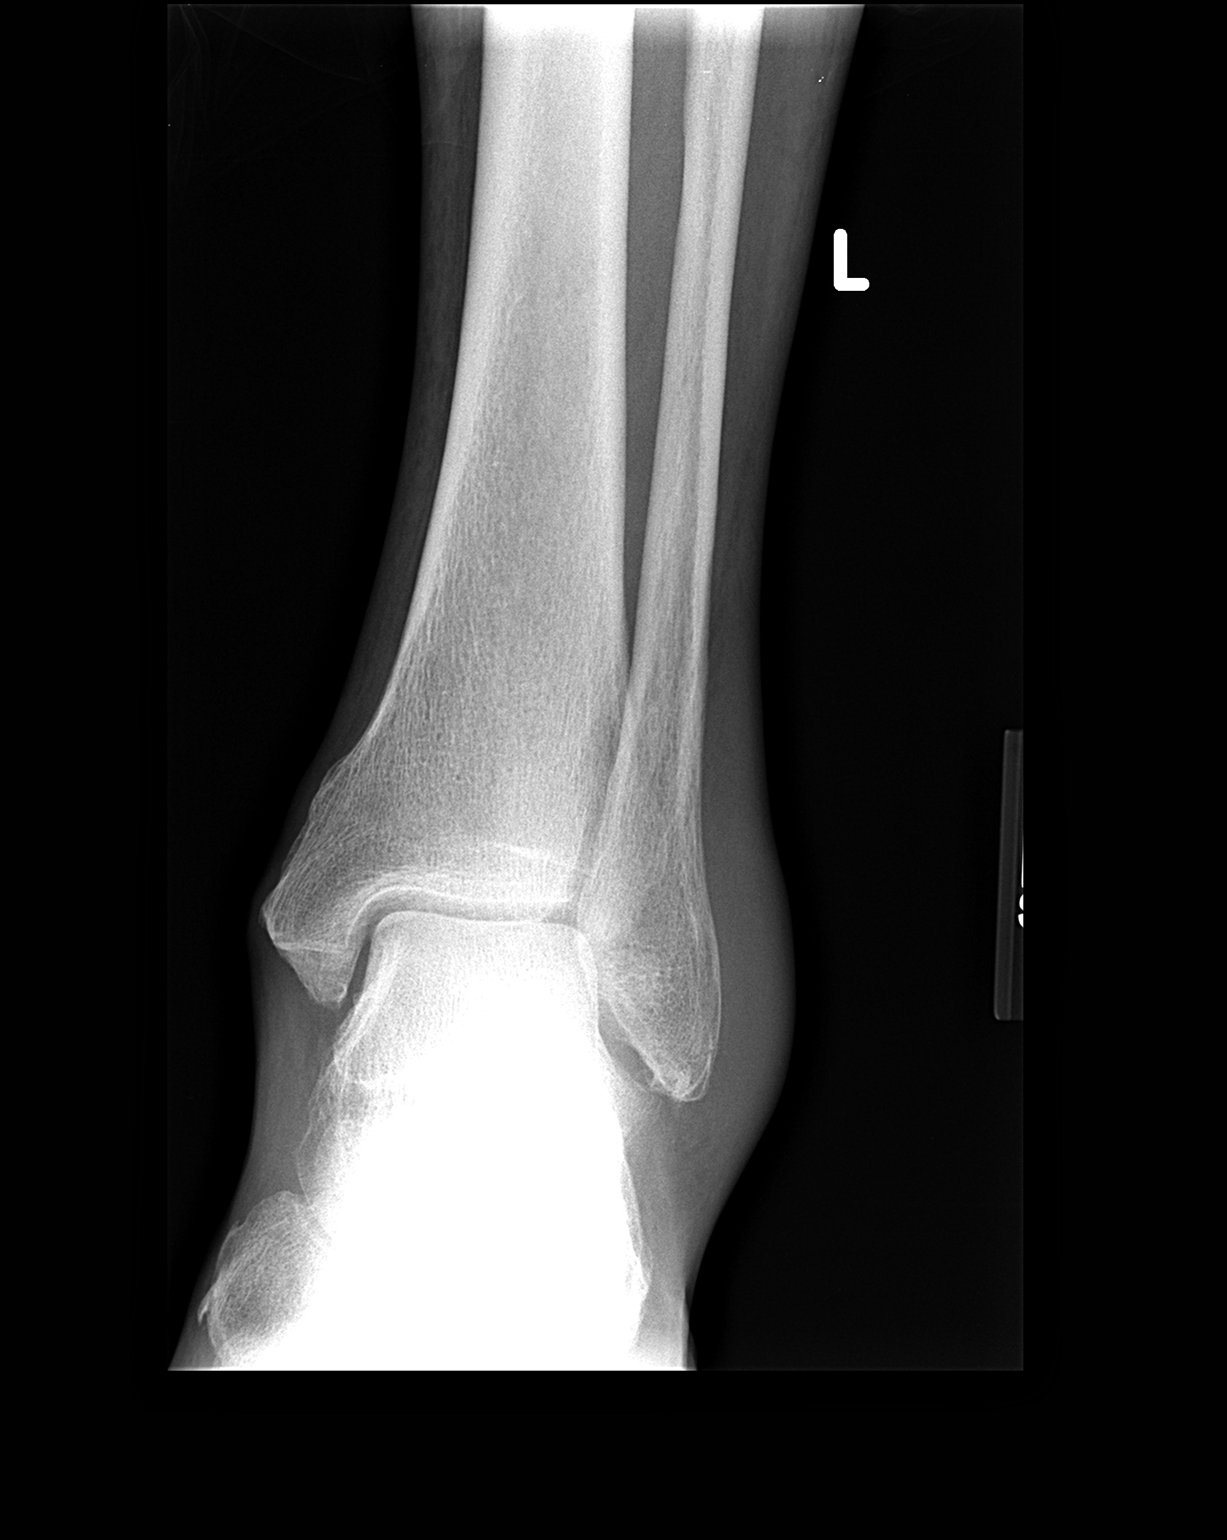

[view not recorded (3 of 3)]
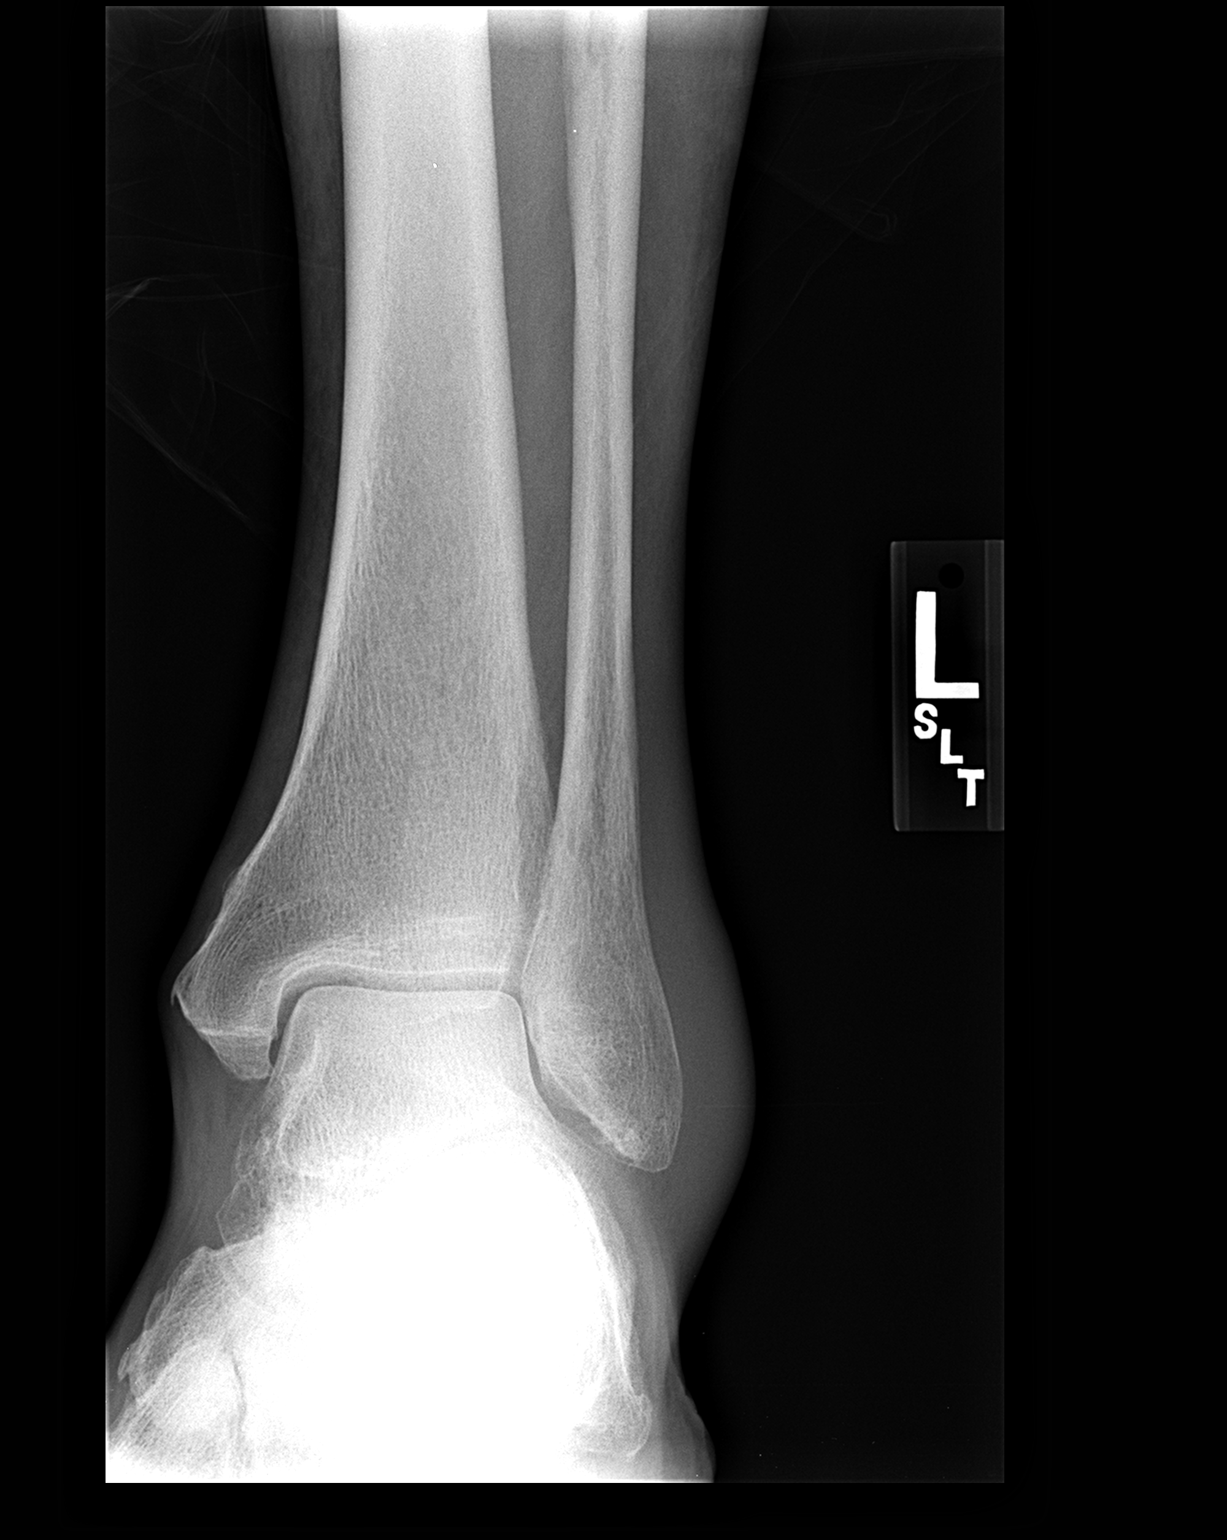

[3 of 3 positions shown; findings below may reference images not displayed]

FINDINGS: Marked lateral soft tissue swelling.  No fracture or
subluxation.
IMPRESSION: No acute bony abnormality.

## 2011-07-05 DIAGNOSIS — M7918 Myalgia, other site: Secondary | ICD-10-CM | POA: Insufficient documentation

## 2011-07-05 DIAGNOSIS — M7540 Impingement syndrome of unspecified shoulder: Secondary | ICD-10-CM | POA: Insufficient documentation

## 2011-07-21 ENCOUNTER — Ambulatory Visit (INDEPENDENT_AMBULATORY_CARE_PROVIDER_SITE_OTHER): Payer: Medicare Other | Admitting: Cardiology

## 2011-07-21 ENCOUNTER — Encounter: Payer: Self-pay | Admitting: Cardiology

## 2011-07-21 VITALS — BP 146/62 | HR 71 | Ht 71.0 in | Wt 162.1 lb

## 2011-07-21 DIAGNOSIS — E7800 Pure hypercholesterolemia, unspecified: Secondary | ICD-10-CM

## 2011-07-21 DIAGNOSIS — I2510 Atherosclerotic heart disease of native coronary artery without angina pectoris: Secondary | ICD-10-CM

## 2011-07-21 DIAGNOSIS — I10 Essential (primary) hypertension: Secondary | ICD-10-CM

## 2011-07-21 NOTE — Patient Instructions (Signed)
Your physician recommends that you schedule a follow-up appointment in: 2 MONTHS  Your physician recommends that you continue on your current medications as directed. Please refer to the Current Medication list given to you today.   

## 2011-07-24 NOTE — Progress Notes (Signed)
   HPI:  He is getting stronger and doing well.  Denies any chest pain.  Has had follow up in [place], and is scheduled now to follow up with Dr. Rosana Hoes here in town at the [facility]rology clinic.  He is no longer dizzy or significantly orthostatic, and seems to be regaining his strength at present.    Current Outpatient Prescriptions  Medication Sig Dispense Refill  . aspirin 81 MG EC tablet Take 81 mg by mouth daily.        . celecoxib (CELEBREX) 200 MG capsule Take 200 mg by mouth daily.        . metoprolol tartrate (LOPRESSOR) 25 MG tablet Take one-fourth tablet by mouth twice a day      . nitroGLYCERIN (NITROSTAT) 0.4 MG SL tablet Place 1 tablet (0.4 mg total) under the tongue every 5 (five) minutes as needed for chest pain.  25 tablet  3  . pregabalin (LYRICA) 75 MG capsule Take 75 mg by mouth as needed.          Allergies  Allergen Reactions  . Rosuvastatin     Past Medical History  Diagnosis Date  . Myocardial infarct 03/21/2008  . PCI (pneumatosis cystoides intestinalis)   . Coronary atherosclerosis of native coronary artery   . Pure hypercholesterolemia   . Phlebitis and thrombophlebitis of superficial vessels of lower extremities   . Gross hematuria   . Cough     Past Surgical History  Procedure Date  . Replacement total knee bilateral   . Carpal tunnel release   . Trigger finger surgery     Family History  Problem Relation Age of Onset  . Cancer Sister   . Cancer Sister     History   Social History  . Marital Status: Married    Spouse Name: N/A    Number of Children: N/A  . Years of Education: N/A   Occupational History  . Not on file.   Social History Main Topics  . Smoking status: Never Smoker   . Smokeless tobacco: Not on file  . Alcohol Use: No  . Drug Use: No  . Sexually Active: Not on file   Other Topics Concern  . Not on file   Social History Narrative   Lives in [place] w/ wife. 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  Denies tobacco, alcohol, or drug use ever in lifetime.    ROS: Please see the HPI.  All other systems reviewed and negative.  PHYSICAL EXAM:  BP 146/62  Pulse 71  Ht 5\' 11"  (1.803 m)  Wt 73.537 kg (162 lb 1.9 oz)  BMI 22.61 kg/m2  General: Thin older gentleman in no distress Head:  Normocephalic and atraumatic. Neck: no JVD Lungs: Clear to auscultation and percussion. Heart: Normal S1 and S2.  No change in cardiac exam.   Abdomen:  Normal bowel sounds; soft; non tender; no organomegaly Pulses: Pulses normal in all 4 extremities. Extremities: No clubbing or cyanosis. No edema. Neurologic: Alert and oriented x 3.  EKG:  NSR.  Leftward axis, and low voltage.  Anterolateral MI, age indetermined.  No acute changes.  LAFB.   ASSESSMENT AND PLAN:

## 2011-08-08 NOTE — Assessment & Plan Note (Signed)
See last assessment.  Red Yeast rice, and no other treatment plans.

## 2011-08-08 NOTE — Assessment & Plan Note (Addendum)
Stable cardiac wise.  No real change in treatment plan for now.

## 2011-08-08 NOTE — Assessment & Plan Note (Signed)
Adequate control at present.

## 2011-08-11 ENCOUNTER — Encounter: Payer: Self-pay | Admitting: Cardiology

## 2011-09-16 ENCOUNTER — Encounter: Payer: Self-pay | Admitting: Cardiology

## 2011-09-16 ENCOUNTER — Ambulatory Visit (INDEPENDENT_AMBULATORY_CARE_PROVIDER_SITE_OTHER): Payer: Medicare Other | Admitting: Cardiology

## 2011-09-16 DIAGNOSIS — E7800 Pure hypercholesterolemia, unspecified: Secondary | ICD-10-CM

## 2011-09-16 DIAGNOSIS — D649 Anemia, unspecified: Secondary | ICD-10-CM | POA: Insufficient documentation

## 2011-09-16 DIAGNOSIS — I2510 Atherosclerotic heart disease of native coronary artery without angina pectoris: Secondary | ICD-10-CM

## 2011-09-16 HISTORY — DX: Anemia, unspecified: D64.9

## 2011-09-16 LAB — CBC WITH DIFFERENTIAL/PLATELET
Basophils Relative: 0.8 % (ref 0.0–3.0)
Eosinophils Relative: 2.3 % (ref 0.0–5.0)
HCT: 40.6 % (ref 39.0–52.0)
Hemoglobin: 13 g/dL (ref 13.0–17.0)
Lymphocytes Relative: 28.9 % (ref 12.0–46.0)
Lymphs Abs: 1.7 10*3/uL (ref 0.7–4.0)
Monocytes Relative: 8.5 % (ref 3.0–12.0)
Neutro Abs: 3.4 10*3/uL (ref 1.4–7.7)
RBC: 4.85 Mil/uL (ref 4.22–5.81)

## 2011-09-16 LAB — BASIC METABOLIC PANEL
Calcium: 9.5 mg/dL (ref 8.4–10.5)
GFR: 64.19 mL/min (ref >60.00)
Glucose, Bld: 84 mg/dL (ref 70–99)
Potassium: 4.4 mEq/L (ref 3.5–5.1)
Sodium: 141 mEq/L (ref 135–145)

## 2011-09-16 NOTE — Assessment & Plan Note (Signed)
Need to recheck?

## 2011-09-16 NOTE — Patient Instructions (Signed)
Your physician recommends that you have lab work today: CBC and BMP  Your physician recommends that you continue on your current medications as directed. Please refer to the Current Medication list given to you today.  Your physician recommends that you schedule a follow-up appointment in: 3 MONTHS

## 2011-09-16 NOTE — Assessment & Plan Note (Signed)
Does not tolerate statins.

## 2011-09-16 NOTE — Progress Notes (Signed)
   HPI:  Regaining his strength.  Doing well overall.  Denies any chest pain.  Feels better.  Still gets lightheaded if he gets up to fast.  Has melanoma on his back.    Current Outpatient Prescriptions  Medication Sig Dispense Refill  . aspirin 81 MG EC tablet Take 81 mg by mouth daily.        . celecoxib (CELEBREX) 200 MG capsule Take 200 mg by mouth daily.        . fluticasone (FLONASE) 50 MCG/ACT nasal spray Place 1 spray into the nose as needed.       . metoprolol tartrate (LOPRESSOR) 25 MG tablet Take one-fourth tablet by mouth twice a day      . nitroGLYCERIN (NITROSTAT) 0.4 MG SL tablet Place 1 tablet (0.4 mg total) under the tongue every 5 (five) minutes as needed for chest pain.  25 tablet  3  . nystatin-triamcinolone (MYCOLOG II) cream Apply 1 application topically as needed.       . pregabalin (LYRICA) 75 MG capsule Take 75 mg by mouth as needed.        . Red Yeast Rice 600 MG CAPS Take 600 mg by mouth at bedtime.        Allergies  Allergen Reactions  . Rosuvastatin     Past Medical History  Diagnosis Date  . Myocardial infarct 03/21/2008  . PCI (pneumatosis cystoides intestinalis)   . Coronary atherosclerosis of native coronary artery   . Pure hypercholesterolemia   . Phlebitis and thrombophlebitis of superficial vessels of lower extremities   . Gross hematuria   . Cough     Past Surgical History  Procedure Date  . Replacement total knee bilateral   . Carpal tunnel release   . Trigger finger surgery     Family History  Problem Relation Age of Onset  . Cancer Sister   . Cancer Sister     History   Social History  . Marital Status: Married    Spouse Name: N/A    Number of Children: N/A  . Years of Education: N/A   Occupational History  . Not on file.   Social History Main Topics  . Smoking status: Never Smoker   . Smokeless tobacco: Not on file  . Alcohol Use: No  . Drug Use: No  . Sexually Active: Not on file   Other Topics Concern  . Not on  file   Social History Narrative   Lives in [place] w/ wife. 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 Denies tobacco, alcohol, or drug use ever in lifetime.    ROS: Please see the HPI.  All other systems reviewed and negative.  PHYSICAL EXAM:  BP 142/68  Pulse 52  Ht 5\' 11"  (1.803 m)  Wt 166 lb 12.8 oz (75.66 kg)  BMI 23.26 kg/m2  General: Well developed, well nourished, in no acute distress. Head:  Normocephalic and atraumatic. Neck: no JVD.  Back:  Bandaid over biopsy site.   Lungs: Clear to auscultation and percussion. Heart: Normal S1 and S2.  No murmur, rubs or gallops.  Abdomen:  Normal bowel sounds; soft; non tender; no organomegaly Pulses: Pulses normal in all 4 extremities. Extremities: No clubbing or cyanosis. No edema. Neurologic: Alert and oriented x 3.  EKG:    ASSESSMENT AND PLAN:

## 2011-09-16 NOTE — Assessment & Plan Note (Signed)
No recurrent angina.  Stable at present.

## 2011-09-29 DIAGNOSIS — M25511 Pain in right shoulder: Secondary | ICD-10-CM | POA: Insufficient documentation

## 2011-10-04 DIAGNOSIS — N320 Bladder-neck obstruction: Secondary | ICD-10-CM

## 2011-10-04 DIAGNOSIS — N400 Enlarged prostate without lower urinary tract symptoms: Secondary | ICD-10-CM

## 2011-10-04 HISTORY — DX: Bladder-neck obstruction: N32.0

## 2011-10-04 HISTORY — DX: Benign prostatic hyperplasia without lower urinary tract symptoms: N40.0

## 2011-12-20 ENCOUNTER — Ambulatory Visit (INDEPENDENT_AMBULATORY_CARE_PROVIDER_SITE_OTHER): Payer: Medicare Other | Admitting: Cardiology

## 2011-12-20 ENCOUNTER — Encounter: Payer: Self-pay | Admitting: Cardiology

## 2011-12-20 VITALS — BP 166/86 | HR 57 | Ht 70.5 in | Wt 164.0 lb

## 2011-12-20 DIAGNOSIS — I2510 Atherosclerotic heart disease of native coronary artery without angina pectoris: Secondary | ICD-10-CM

## 2011-12-20 DIAGNOSIS — R2681 Unsteadiness on feet: Secondary | ICD-10-CM

## 2011-12-20 DIAGNOSIS — R269 Unspecified abnormalities of gait and mobility: Secondary | ICD-10-CM

## 2011-12-20 DIAGNOSIS — I10 Essential (primary) hypertension: Secondary | ICD-10-CM

## 2011-12-20 DIAGNOSIS — E7800 Pure hypercholesterolemia, unspecified: Secondary | ICD-10-CM

## 2011-12-20 NOTE — Assessment & Plan Note (Signed)
Uses red yeast rice, not on a statin.

## 2011-12-20 NOTE — Progress Notes (Signed)
HPI:  The patient is seen in followup. About a week ago he noticed that he had an unsteady gait. He was also worse when he laid down and that the room did not feel study. He's had vertigo in the past, and it did not feel exactly like that. He did not have any lateralizing weakness. The exact cause of this was unclear. He went to see Dr. Melina Modena in [place], and they cleaned out his left ear. He is been rather improved since that time, perhaps about 90% improved. He denies any specific cardiac symptoms. He has not any chest pain. His most recent hemoglobin has improved. He is skeptical that his father could account for all of his symptoms, but does admit that he is better.  Current Outpatient Prescriptions  Medication Sig Dispense Refill  . aspirin 81 MG EC tablet Take 81 mg by mouth daily.        . celecoxib (CELEBREX) 200 MG capsule Take 200 mg by mouth daily.        . fluticasone (FLONASE) 50 MCG/ACT nasal spray Place 1 spray into the nose as needed.       . metoprolol tartrate (LOPRESSOR) 25 MG tablet Take one-fourth tablet by mouth twice a day      . nitroGLYCERIN (NITROSTAT) 0.4 MG SL tablet Place 1 tablet (0.4 mg total) under the tongue every 5 (five) minutes as needed for chest pain.  25 tablet  3  . nystatin-triamcinolone (MYCOLOG II) cream Apply 1 application topically as needed.       . pregabalin (LYRICA) 75 MG capsule Take 75 mg by mouth as needed.        . Red Yeast Rice 600 MG CAPS Take 600 mg by mouth at bedtime.        Allergies  Allergen Reactions  . Rosuvastatin     Past Medical History  Diagnosis Date  . Myocardial infarct 03/21/2008  . PCI (pneumatosis cystoides intestinalis)   . Coronary atherosclerosis of native coronary artery   . Pure hypercholesterolemia   . Phlebitis and thrombophlebitis of superficial vessels of lower extremities   . Gross hematuria   . Cough     Past Surgical History  Procedure Date  . Replacement total knee bilateral   . Carpal tunnel  release   . Trigger finger surgery     Family History  Problem Relation Age of Onset  . Cancer Sister   . Cancer Sister     History   Social History  . Marital Status: Married    Spouse Name: N/A    Number of Children: N/A  . Years of Education: N/A   Occupational History  . Not on file.   Social History Main Topics  . Smoking status: Never Smoker   . Smokeless tobacco: Not on file  . Alcohol Use: No  . Drug Use: No  . Sexually Active: Not on file   Other Topics Concern  . Not on file   Social History Narrative   Lives in [place] w/ wife. 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 Denies tobacco, alcohol, or drug use ever in lifetime.    ROS: Please see the HPI.  All other systems reviewed and negative.  PHYSICAL EXAM:  BP 166/86  Pulse 57  Ht 5' 10.5" (1.791 m)  Wt 164 lb (74.39 kg)  BMI 23.20 kg/m2  General: Well developed, well nourished, in no acute distress. Head:  Normocephalic and atraumatic. Neck: no JVD.  No carotid bruits appreciated.   Lungs: Clear to auscultation and percussion. Heart: Normal S1 and S2.  No murmur, rubs or gallops.  Pulses: Pulses normal in all 4 extremities. Extremities: No clubbing or cyanosis. No edema. Neurologic: Alert and oriented x 3.  Normal gait now.  Normal Finger to Nose.  No drift.  CN intact.  Strength equal and bilateral.    EKG:  No acute changes.  SSB.  Anterolateral MI, age indeterminate.  Left axis deviation.   ASSESSMENT AND PLAN:

## 2011-12-20 NOTE — Assessment & Plan Note (Signed)
Mild elevation in BP.

## 2011-12-20 NOTE — Patient Instructions (Signed)
Your physician recommends that you schedule a follow-up appointment in: [place] has requested that you have a carotid duplex. This test is an ultrasound of the carotid arteries in your neck. It looks at blood flow through these arteries that supply the brain with blood. Allow one hour for this exam. There are no restrictions or special instructions.

## 2011-12-20 NOTE — Assessment & Plan Note (Signed)
No current symptoms

## 2011-12-20 NOTE — Assessment & Plan Note (Signed)
This is largely resolved. The exact etiology is unclear. I told him if symptoms persist he should see his primary care doctor, and consideration may need to be given to doing an MRI. He has no obvious carotid bruits, it would be important to do carotid Dopplers to assess vertebral anatomy as well. His exam is largely unremarkable at this point in time, it does not reproduce any of his symptoms. I do not appreciate any type of bruits, although he does have widespread coronary disease and the likelihood of plaque in the cerebrovascular system certainly is possible.  Will get them done, he will remain on ASA, and I will see him back in four weeks.

## 2011-12-28 ENCOUNTER — Encounter (INDEPENDENT_AMBULATORY_CARE_PROVIDER_SITE_OTHER): Payer: Medicare Other

## 2011-12-28 DIAGNOSIS — R2681 Unsteadiness on feet: Secondary | ICD-10-CM

## 2011-12-28 DIAGNOSIS — R269 Unspecified abnormalities of gait and mobility: Secondary | ICD-10-CM

## 2011-12-28 DIAGNOSIS — I6529 Occlusion and stenosis of unspecified carotid artery: Secondary | ICD-10-CM

## 2012-01-20 ENCOUNTER — Ambulatory Visit: Payer: Medicare Other | Admitting: Cardiology

## 2012-01-31 ENCOUNTER — Ambulatory Visit (INDEPENDENT_AMBULATORY_CARE_PROVIDER_SITE_OTHER): Payer: Medicare Other | Admitting: Cardiology

## 2012-01-31 ENCOUNTER — Encounter: Payer: Self-pay | Admitting: Cardiology

## 2012-01-31 VITALS — BP 146/90 | HR 84 | Ht 70.5 in | Wt 166.0 lb

## 2012-01-31 DIAGNOSIS — I4891 Unspecified atrial fibrillation: Secondary | ICD-10-CM

## 2012-01-31 DIAGNOSIS — I2510 Atherosclerotic heart disease of native coronary artery without angina pectoris: Secondary | ICD-10-CM

## 2012-01-31 DIAGNOSIS — I4892 Unspecified atrial flutter: Secondary | ICD-10-CM

## 2012-01-31 LAB — CBC WITH DIFFERENTIAL/PLATELET
Eosinophils Relative: 2.2 % (ref 0.0–5.0)
HCT: 44.2 % (ref 39.0–52.0)
Hemoglobin: 14.5 g/dL (ref 13.0–17.0)
Lymphs Abs: 1.8 10*3/uL (ref 0.7–4.0)
Monocytes Relative: 8.1 % (ref 3.0–12.0)
Neutro Abs: 3.9 10*3/uL (ref 1.4–7.7)
WBC: 6.4 10*3/uL (ref 4.5–10.5)

## 2012-01-31 LAB — BASIC METABOLIC PANEL
CO2: 28 mEq/L (ref 19–32)
Calcium: 9.5 mg/dL (ref 8.4–10.5)
Glucose, Bld: 101 mg/dL — ABNORMAL HIGH (ref 70–99)
Sodium: 140 mEq/L (ref 135–145)

## 2012-01-31 NOTE — Assessment & Plan Note (Signed)
He is currently not having any symptoms. Conservative management will still be recommended. He remains on aspirin, and he is on this now for atrial fibrillation as well as he does take anticoagulation therapy

## 2012-01-31 NOTE — Assessment & Plan Note (Addendum)
The patient's EKG demonstrates atrial flutter. We noted this mainly on examination he is asymptomatic. We will increase his metoprolol to 12.5 mg twice daily and this probably provide adequate rate control. He is not crazy about the idea of anticoagulation therapy asked him to return with his family approximately 2 days in the interim we will also begin making plans for evaluation by the electrophysiologic service he could potentially be treated with ablation therapy, and given his desire to stay off anticoagulation this might be optimal. We will reassess.  We will make arrangements for echocardiography, and thyroid function studies as well.

## 2012-01-31 NOTE — Patient Instructions (Addendum)
Your physician has recommended you make the following change in your medication: INCREASE Metoprolol Tartrate to 25mg  take one tablet by mouth twice a day   Your physician recommends that you schedule a follow-up appointment on Wednesday, February 02, 2012 with Dr Lia Foyer to discuss Atrial Fibrillation.   Your physician recommends that you return for lab work in: CBC, BMP, TSH and Free T4  You have been referred to Electrophysiology for evaluation of Atrial Fibrillation.   Your physician has requested that you have an echocardiogram. Echocardiography is a painless test that uses sound waves to create images of your heart. It provides your doctor with information about the size and shape of your heart and how well your heart's chambers and valves are working. This procedure takes approximately one hour. There are no restrictions for this procedure.

## 2012-01-31 NOTE — Progress Notes (Signed)
   HPI:  The patient is in for followup. From a cardiac standpoint, he is been stable. He had some vertigo, bony cleaned out his ear it seemed to improve substantially. It is totally gone at this point in time. He denies any true cardiac symptoms.  Current Outpatient Prescriptions  Medication Sig Dispense Refill  . aspirin 81 MG EC tablet Take 81 mg by mouth daily.        . celecoxib (CELEBREX) 200 MG capsule Take 200 mg by mouth daily.        . fluticasone (FLONASE) 50 MCG/ACT nasal spray Place 1 spray into the nose as needed.       . metoprolol tartrate (LOPRESSOR) 25 MG tablet Take one-fourth tablet by mouth twice a day      . nitroGLYCERIN (NITROSTAT) 0.4 MG SL tablet Place 1 tablet (0.4 mg total) under the tongue every 5 (five) minutes as needed for chest pain.  25 tablet  3  . nystatin-triamcinolone (MYCOLOG II) cream Apply 1 application topically as needed.       . pregabalin (LYRICA) 75 MG capsule Take 75 mg by mouth as needed.        . Red Yeast Rice 600 MG CAPS Take 600 mg by mouth at bedtime.        Allergies  Allergen Reactions  . Rosuvastatin     Past Medical History  Diagnosis Date  . Myocardial infarct 03/21/2008  . PCI (pneumatosis cystoides intestinalis)   . Coronary atherosclerosis of native coronary artery   . Pure hypercholesterolemia   . Phlebitis and thrombophlebitis of superficial vessels of lower extremities   . Gross hematuria   . Cough     Past Surgical History  Procedure Date  . Replacement total knee bilateral   . Carpal tunnel release   . Trigger finger surgery     Family History  Problem Relation Age of Onset  . Cancer Sister   . Cancer Sister     History   Social History  . Marital Status: Married    Spouse Name: N/A    Number of Children: N/A  . Years of Education: N/A   Occupational History  . Not on file.   Social History Main Topics  . Smoking status: Never Smoker   . Smokeless tobacco: Not on file  . Alcohol Use: No  . Drug  Use: No  . Sexually Active: Not on file   Other Topics Concern  . Not on file   Social History Narrative   Lives in [place] w/ wife. 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 Denies tobacco, alcohol, or drug use ever in lifetime.    ROS: Please see the HPI.  All other systems reviewed and negative.  PHYSICAL EXAM:  BP 146/90  Pulse 84  Ht 5' 10.5" (1.791 m)  Wt 166 lb (75.297 kg)  BMI 23.48 kg/m2  General: Well developed, well nourished, in no acute distress. Head:  Normocephalic and atraumatic. Neck: no JVD Lungs: Clear to auscultation and percussion. Heart: Normal S1 and S2.  Frequent skips and irregularities.   Abdomen:  Normal bowel sounds; soft; non tender; no organomegaly Pulses: Pulses normal in all 4 extremities. Extremities: No clubbing or cyanosis. No edema. Neurologic: Alert and oriented x 3.  EKG:  Atrial flutter with variable block.  New onset  ASSESSMENT AND PLAN:  More than 25 minutes spent in evaluation and review of the data, and treatment plans for the patient.

## 2012-02-02 ENCOUNTER — Ambulatory Visit: Payer: Medicare Other | Admitting: Cardiology

## 2012-02-02 ENCOUNTER — Ambulatory Visit (INDEPENDENT_AMBULATORY_CARE_PROVIDER_SITE_OTHER): Payer: Medicare Other | Admitting: Cardiology

## 2012-02-02 ENCOUNTER — Ambulatory Visit (HOSPITAL_COMMUNITY): Payer: Medicare Other | Attending: Internal Medicine | Admitting: Radiology

## 2012-02-02 ENCOUNTER — Encounter: Payer: Self-pay | Admitting: Cardiology

## 2012-02-02 VITALS — BP 171/70 | HR 62 | Ht 70.0 in | Wt 168.0 lb

## 2012-02-02 DIAGNOSIS — I4892 Unspecified atrial flutter: Secondary | ICD-10-CM | POA: Insufficient documentation

## 2012-02-02 DIAGNOSIS — I10 Essential (primary) hypertension: Secondary | ICD-10-CM | POA: Insufficient documentation

## 2012-02-02 DIAGNOSIS — I2510 Atherosclerotic heart disease of native coronary artery without angina pectoris: Secondary | ICD-10-CM

## 2012-02-02 DIAGNOSIS — E7800 Pure hypercholesterolemia, unspecified: Secondary | ICD-10-CM | POA: Insufficient documentation

## 2012-02-02 DIAGNOSIS — I252 Old myocardial infarction: Secondary | ICD-10-CM | POA: Insufficient documentation

## 2012-02-02 DIAGNOSIS — I4891 Unspecified atrial fibrillation: Secondary | ICD-10-CM

## 2012-02-02 DIAGNOSIS — I079 Rheumatic tricuspid valve disease, unspecified: Secondary | ICD-10-CM | POA: Insufficient documentation

## 2012-02-02 NOTE — Progress Notes (Signed)
Echocardiogram performed.  

## 2012-02-02 NOTE — Patient Instructions (Addendum)
Your physician recommends that you schedule a follow-up appointment in: [place] physician recommends that you continue on your current medications as directed. Please refer to the Current Medication list given to you today.

## 2012-02-05 NOTE — Progress Notes (Addendum)
   HPI:  The patient returns in followup. I last saw him 2 days ago. At that time he had new atrial flutter. His beta blockers were increased. He continues to do well, and he is virtually asymptomatic. He returned with his family today to discuss the possibilities of his current treatment.  He had a lot of bladder bleeding when started on coumadin.   Current Outpatient Prescriptions  Medication Sig Dispense Refill  . aspirin 81 MG EC tablet Take 81 mg by mouth daily.        . celecoxib (CELEBREX) 200 MG capsule Take 200 mg by mouth daily.        . fluticasone (FLONASE) 50 MCG/ACT nasal spray Place 1 spray into the nose as needed.       . metoprolol tartrate (LOPRESSOR) 25 MG tablet Take 1 tablet (25 mg total) by mouth 2 (two) times daily.      . nitroGLYCERIN (NITROSTAT) 0.4 MG SL tablet Place 1 tablet (0.4 mg total) under the tongue every 5 (five) minutes as needed for chest pain.  25 tablet  3  . nystatin-triamcinolone (MYCOLOG II) cream Apply 1 application topically as needed.       . pregabalin (LYRICA) 75 MG capsule Take 75 mg by mouth as needed.        . Red Yeast Rice 600 MG CAPS Take 600 mg by mouth at bedtime.        Allergies  Allergen Reactions  . Rosuvastatin     Past Medical History  Diagnosis Date  . Myocardial infarct 03/21/2008  . PCI (pneumatosis cystoides intestinalis)   . Coronary atherosclerosis of native coronary artery   . Pure hypercholesterolemia   . Phlebitis and thrombophlebitis of superficial vessels of lower extremities   . Gross hematuria   . Cough     Past Surgical History  Procedure Date  . Replacement total knee bilateral   . Carpal tunnel release   . Trigger finger surgery     Family History  Problem Relation Age of Onset  . Cancer Sister   . Cancer Sister     History   Social History  . Marital Status: Married    Spouse Name: N/A    Number of Children: N/A  . Years of Education: N/A   Occupational History  . Not on file.   Social  History Main Topics  . Smoking status: Never Smoker   . Smokeless tobacco: Not on file  . Alcohol Use: No  . Drug Use: No  . Sexually Active: Not on file   Other Topics Concern  . Not on file   Social History Narrative   Lives in [place] w/ wife. 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 Denies tobacco, alcohol, or drug use ever in lifetime.    ROS: Please see the HPI.  All other systems reviewed and negative.  PHYSICAL EXAM:  BP 171/70  Pulse 62  Ht 5\' 10"  (1.778 m)  Wt 168 lb (76.204 kg)  BMI 24.11 kg/m2  General: Well developed, well nourished, in no acute distress. Head:  Normocephalic and atraumatic. Neck: no JVD Lungs: Clear to auscultation and percussion. Heart: Normal S1 and S2.  No murmur, rubs or gallops.  Pulses: Pulses normal in all 4 extremities. Extremities: No clubbing or cyanosis. No edema. Neurologic: Alert and oriented x 3.  EKG:  SB.  i RBBB.  Leftward axis.  Anterior MI, age indeterminate  ASSESSMENT AND PLAN:

## 2012-02-06 NOTE — Assessment & Plan Note (Signed)
The patient was in atrial flutter.  He has had prior gross hematuria, and would not be excited about using anticoagulation.  He is back in NSR today.  We will continue to follow him closely.

## 2012-02-07 ENCOUNTER — Other Ambulatory Visit: Payer: Self-pay | Admitting: Cardiology

## 2012-02-07 DIAGNOSIS — I4891 Unspecified atrial fibrillation: Secondary | ICD-10-CM

## 2012-02-07 DIAGNOSIS — I2510 Atherosclerotic heart disease of native coronary artery without angina pectoris: Secondary | ICD-10-CM

## 2012-02-07 DIAGNOSIS — I4892 Unspecified atrial flutter: Secondary | ICD-10-CM

## 2012-02-07 MED ORDER — METOPROLOL TARTRATE 25 MG PO TABS: 25.0000 mg | ORAL_TABLET | Freq: Two times a day (BID) | ORAL | Status: DC

## 2012-02-07 NOTE — Telephone Encounter (Signed)
No answer at home number.

## 2012-02-07 NOTE — Telephone Encounter (Signed)
I spoke with the pt and he needs a new Rx sent to the pharmacy for metoprolol tartrate. Rx sent.

## 2012-02-07 NOTE — Telephone Encounter (Signed)
New msg Pt wants to talk to you about metorprolol rx.  He wants to verify dosage. Please call

## 2012-02-09 ENCOUNTER — Encounter: Payer: Self-pay | Admitting: Cardiology

## 2012-02-14 ENCOUNTER — Ambulatory Visit (INDEPENDENT_AMBULATORY_CARE_PROVIDER_SITE_OTHER): Payer: Medicare Other | Admitting: Cardiology

## 2012-02-14 ENCOUNTER — Encounter: Payer: Self-pay | Admitting: Cardiology

## 2012-02-14 VITALS — BP 130/72 | HR 45 | Ht 70.0 in | Wt 182.0 lb

## 2012-02-14 DIAGNOSIS — I4892 Unspecified atrial flutter: Secondary | ICD-10-CM

## 2012-02-14 DIAGNOSIS — E7800 Pure hypercholesterolemia, unspecified: Secondary | ICD-10-CM

## 2012-02-14 DIAGNOSIS — I4891 Unspecified atrial fibrillation: Secondary | ICD-10-CM

## 2012-02-14 DIAGNOSIS — I2510 Atherosclerotic heart disease of native coronary artery without angina pectoris: Secondary | ICD-10-CM

## 2012-02-14 NOTE — Patient Instructions (Addendum)
Your physician recommends that you schedule a follow-up appointment in: [place] with Dr Lia Foyer  Your physician recommends that you continue on your current medications as directed. Please refer to the Current Medication list given to you today.

## 2012-02-23 NOTE — Progress Notes (Signed)
HPI:  The patient was brought back in today to follow up on his atrial arrhythmia, and to discuss issues related to this rhythm so that we are all on the same page with regard to risks, treatment options.  The patient does not want anticoagulant therapy, and his reasons are justified.  We have discussed in detail.  He has had some issues with urinary bleeding.  He was pretty much asymptomatic with this, and we have reviewed in detail the CHADS2 and CHADsVasc2 scoring system, reviewing with him my apps for this.  He and his family have an understanding of this.  He feels great today. Also reviewed echo results.   Current Outpatient Prescriptions  Medication Sig Dispense Refill  . aspirin 81 MG EC tablet Take 81 mg by mouth daily.        . celecoxib (CELEBREX) 200 MG capsule Take 200 mg by mouth daily.        . fluticasone (FLONASE) 50 MCG/ACT nasal spray Place 1 spray into the nose as needed.       . metoprolol tartrate (LOPRESSOR) 25 MG tablet Take 1 tablet (25 mg total) by mouth 2 (two) times daily.  60 tablet  11  . nitroGLYCERIN (NITROSTAT) 0.4 MG SL tablet Place 1 tablet (0.4 mg total) under the tongue every 5 (five) minutes as needed for chest pain.  25 tablet  3  . nystatin-triamcinolone (MYCOLOG II) cream Apply 1 application topically as needed.       . pregabalin (LYRICA) 75 MG capsule Take 75 mg by mouth as needed.        . Red Yeast Rice Extract (RED YEAST RICE PO) Take 1,200 mg by mouth daily.        Allergies  Allergen Reactions  . Rosuvastatin     Past Medical History  Diagnosis Date  . Myocardial infarct 03/21/2008  . PCI (pneumatosis cystoides intestinalis)   . Coronary atherosclerosis of native coronary artery   . Pure hypercholesterolemia   . Phlebitis and thrombophlebitis of superficial vessels of lower extremities   . Gross hematuria   . Cough     Past Surgical History  Procedure Date  . Replacement total knee bilateral   . Carpal tunnel release   . Trigger  finger surgery     Family History  Problem Relation Age of Onset  . Cancer Sister   . Cancer Sister     History   Social History  . Marital Status: Married    Spouse Name: N/A    Number of Children: N/A  . Years of Education: N/A   Occupational History  . Not on file.   Social History Main Topics  . Smoking status: Never Smoker   . Smokeless tobacco: Not on file  . Alcohol Use: No  . Drug Use: No  . Sexually Active: Not on file   Other Topics Concern  . Not on file   Social History Narrative   Lives in [place] w/ wife. 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 Denies tobacco, alcohol, or drug use ever in lifetime.    ROS: Please see the HPI.  All other systems reviewed and negative.  PHYSICAL EXAM:  BP 130/72  Pulse 45  Ht 5\' 10"  (1.778 m)  Wt 182 lb (82.555 kg)  BMI 26.11 kg/m2  General: Thin elderly gentleman in no acute distress.   Head:  Normocephalic and atraumatic. Neck: no JVD Lungs: Clear to auscultation and percussion. Heart: Normal S1  and S2. Minimal if any SEM.  Abdomen:  Normal bowel sounds; soft; non tender; no organomegaly Pulses: Pulses normal in all 4 extremities. Extremities: No clubbing or cyanosis. No edema. Neurologic: Alert and oriented x 3.  EKG:  ECHO:  Study Conclusions  - Left ventricle: The cavity size was normal. There was moderate focal basal hypertrophy of the septum. Systolic function was normal. The estimated ejection fraction was in the range of 55% to 60%. Wall motion was normal; there were no regional wall motion abnormalities. Doppler parameters are consistent with abnormal left ventricular relaxation (grade 1 diastolic dysfunction). - Left atrium: The atrium was mildly dilated. - Right atrium: The atrium was mildly dilated. - Atrial septum: There was increased thickness of the septum, consistent with lipomatous hypertrophy.   ASSESSMENT AND PLAN:

## 2012-02-23 NOTE — Assessment & Plan Note (Signed)
Stable and controlled.  Back in NSR with moderate bradycardia at rest.  He will remain on current treatment.  We have discussed his options regarding anticoagulation, and also discussed the potential need for pacing backup.  For now, we will see how this all plays out.  As noted, he does not want anticoagulation therapy beyond what he is on.

## 2012-02-28 NOTE — Assessment & Plan Note (Signed)
Not taking statins.

## 2012-02-28 NOTE — Assessment & Plan Note (Signed)
No current chest pain.  Continue medical therapy.

## 2012-02-29 ENCOUNTER — Encounter: Payer: Self-pay | Admitting: Internal Medicine

## 2012-02-29 ENCOUNTER — Encounter (INDEPENDENT_AMBULATORY_CARE_PROVIDER_SITE_OTHER): Payer: Medicare Other

## 2012-02-29 ENCOUNTER — Ambulatory Visit (INDEPENDENT_AMBULATORY_CARE_PROVIDER_SITE_OTHER): Payer: Medicare Other | Admitting: Internal Medicine

## 2012-02-29 VITALS — BP 130/76 | HR 48 | Ht 71.0 in | Wt 170.4 lb

## 2012-02-29 DIAGNOSIS — I2510 Atherosclerotic heart disease of native coronary artery without angina pectoris: Secondary | ICD-10-CM

## 2012-02-29 DIAGNOSIS — I4892 Unspecified atrial flutter: Secondary | ICD-10-CM

## 2012-02-29 DIAGNOSIS — I10 Essential (primary) hypertension: Secondary | ICD-10-CM

## 2012-02-29 DIAGNOSIS — I4891 Unspecified atrial fibrillation: Secondary | ICD-10-CM

## 2012-02-29 NOTE — Patient Instructions (Addendum)
Your physician recommends that you schedule a follow-up appointment WITH DR Lia Foyer AS SCHEDULED  Your physician has recommended that you wear an event monitor. Event monitors are medical devices that record the heart's electrical activity. Doctors most often [place] these monitors to diagnose arrhythmias. Arrhythmias are problems with the speed or rhythm of the heartbeat. The monitor is a small, portable device. You can wear one while you do your normal daily activities. This is usually used to diagnose what is causing palpitations/syncope (passing out).

## 2012-03-03 ENCOUNTER — Telehealth: Payer: Self-pay | Admitting: Physician Assistant

## 2012-03-03 NOTE — Telephone Encounter (Signed)
Patient's daughter-in-law called re: use of event monitor. Wanted to know if it is okay for the patient to shower. Advised showering is okay as long as monitor itself does not get wet. They have been unhooking the monitor while the patient showers, then hooking it back up again. The patient's daughter-in-law notes his HR has been in 34s. Advised that his HR was 48 bpm in the office, and to transmit his rhythm if he experiences symptoms such as palpitations, lightheadedness/dizziness/presyncope/syncope, chest pain or shortness of breath. Advised to adhere to instructions given upon placement of event monitor in the office. She understood and agreed.    Jacquelynn Cree, PA-C 03/03/2012, 5:38 PM

## 2012-03-04 NOTE — Progress Notes (Signed)
Primary Care Physician: Nicoletta Dress, MD Referring Physician:  Dr Miguel Dibble [name] is a 78 y.o. male with a h/o CAD who presents today for consultation regarding his recent atrial arrhythmia.  He is followed by Dr Lia Foyer.  He presented to the office 01/31/12 for routine cardiology follow-up and was found to have an atrial arrhythmia presumed to be atrial flutter. He was completely asymptomatic at the time.  His metoprolol was increased.  Upon return to the office he was found to have returned to sinus rhythm.  Due to prior gross hematuria, he was felt to be a poor candidate for anticoagulation long term.   He is referred to EP for further evaluation.   Today, he denies symptoms of palpitations, chest pain, shortness of breath, orthopnea, PND, lower extremity edema, dizziness, presyncope, syncope, or neurologic sequela. The patient is tolerating medications without difficulties and is otherwise without complaint today.   Past Medical History  Diagnosis Date  . Myocardial infarct 03/21/2008  . PCI (pneumatosis cystoides intestinalis)   . Coronary atherosclerosis of native coronary artery   . Pure hypercholesterolemia   . Phlebitis and thrombophlebitis of superficial vessels of lower extremities   . Gross hematuria   . Cough    Past Surgical History  Procedure Date  . Replacement total knee bilateral   . Carpal tunnel release   . Trigger finger surgery     Current Outpatient Prescriptions  Medication Sig Dispense Refill  . aspirin 81 MG EC tablet Take 81 mg by mouth daily.        . celecoxib (CELEBREX) 200 MG capsule Take 200 mg by mouth daily.        . metoprolol tartrate (LOPRESSOR) 25 MG tablet Take 1 tablet (25 mg total) by mouth 2 (two) times daily.  60 tablet  11  . nitroGLYCERIN (NITROSTAT) 0.4 MG SL tablet Place 1 tablet (0.4 mg total) under the tongue every 5 (five) minutes as needed for chest pain.  25 tablet  3  . pregabalin (LYRICA) 75 MG capsule Take 75  mg by mouth as needed.        . Red Yeast Rice Extract (RED YEAST RICE PO) Take 1,200 mg by mouth daily.        Allergies  Allergen Reactions  . Rosuvastatin     History   Social History  . Marital Status: Married    Spouse Name: N/A    Number of Children: N/A  . Years of Education: N/A   Occupational History  . Not on file.   Social History Main Topics  . Smoking status: Never Smoker   . Smokeless tobacco: Never Used  . Alcohol Use: No  . Drug Use: No  . Sexually Active: Not on file   Other Topics Concern  . Not on file   Social History Narrative   Lives in [place] w/ wife. 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 Denies tobacco, alcohol, or drug use ever in lifetime.    Family History  Problem Relation Age of Onset  . Cancer Sister   . Cancer Sister     ROS- All systems are reviewed and negative except as per the HPI above  Physical Exam: Filed Vitals:   02/29/12 1516  BP: 130/76  Pulse: 48  Height: 5\' 11"  (1.803 m)  Weight: 170 lb 6.4 oz (77.293 kg)  SpO2: 99%    GEN- The patient is elderly appearing, alert and oriented x 3 today.  Head- normocephalic, atraumatic Eyes-  Sclera clear, conjunctiva pink Ears- hearing intact Oropharynx- clear Neck- supple,   Lungs- Clear to ausculation bilaterally, normal work of breathing Heart- Regular rate and rhythm, no murmurs, rubs or gallops, PMI not laterally displaced GI- soft, NT, ND, + BS Extremities- no clubbing, cyanosis, or edema MS- age appropriate atrophy Skin- no rash or lesion Psych- euthymic mood, full affect Neuro- strength and sensation are intact  EKG from 01/31/12 is reviewed (see below)  Assessment and Plan:

## 2012-03-04 NOTE — Assessment & Plan Note (Signed)
Stable No change required today  

## 2012-03-04 NOTE — Assessment & Plan Note (Signed)
No ischemic symptoms No changes today 

## 2012-03-04 NOTE — Assessment & Plan Note (Signed)
EKG from 01/31/12 is reviewed in detail today by myself as well as Dr Lovena Le.  The ekg is a difficult one.  I am not certain that the ekg represents atrial flutter.  It may actually be sinus with first degree AV block frequent pacs.   It clearly looks different from all of his other ekgs which are clear sinus bradycardia with a more prominent p wave.  For this reason, I am suspicious that this ekg may very well be an atrial flutter.  I do not however feel certain enough that this would be an isthmus dependant atrial flutter that I would proceed with EP study at this point.  I think that given his advanced age, this may do more harm that good. At this point, I will place an event monitor.  Perhaps this will help [place] better determine if he has atrial arrhythmias and better evaluate his stroke risks. Given his gross hematuria, he is a poor candidate for anticoagulation.  No changes are made today.  He will follow-up with Dr Lia Foyer as scheduled.  I will confer with Dr Lia Foyer once the results of the event monitor are available. I will see him as needed going forward.

## 2012-03-08 ENCOUNTER — Ambulatory Visit: Payer: Medicare Other | Admitting: Internal Medicine

## 2012-03-29 ENCOUNTER — Ambulatory Visit (INDEPENDENT_AMBULATORY_CARE_PROVIDER_SITE_OTHER): Payer: Medicare Other | Admitting: Cardiology

## 2012-03-29 ENCOUNTER — Encounter: Payer: Self-pay | Admitting: Cardiology

## 2012-03-29 VITALS — BP 130/76 | HR 48 | Ht 71.0 in | Wt 167.1 lb

## 2012-03-29 DIAGNOSIS — I10 Essential (primary) hypertension: Secondary | ICD-10-CM

## 2012-03-29 DIAGNOSIS — I2510 Atherosclerotic heart disease of native coronary artery without angina pectoris: Secondary | ICD-10-CM

## 2012-03-29 DIAGNOSIS — I4891 Unspecified atrial fibrillation: Secondary | ICD-10-CM

## 2012-03-29 DIAGNOSIS — E7800 Pure hypercholesterolemia, unspecified: Secondary | ICD-10-CM

## 2012-03-29 NOTE — Patient Instructions (Addendum)
Your physician recommends that you schedule a follow-up appointment in: 6 WEEKS  Your physician recommends that you continue on your current medications as directed. Please refer to the Current Medication list given to you today.  

## 2012-04-01 NOTE — Progress Notes (Signed)
   HPI: [name] as always seems to be in good spirits. He's not had any syncope or presyncope. He underwent a monitor by Dr. Rayann Heman, and this demonstrated occasional sinus bradycardia and no prolonged pauses. As such, they have deferred any discussion regarding pacing.  That is also [name]'s preference.    Current Outpatient Prescriptions  Medication Sig Dispense Refill  . aspirin 81 MG EC tablet Take 81 mg by mouth daily.        . celecoxib (CELEBREX) 200 MG capsule Take 200 mg by mouth daily.        . metoprolol tartrate (LOPRESSOR) 25 MG tablet Take 1 tablet (25 mg total) by mouth 2 (two) times daily.  60 tablet  11  . nitroGLYCERIN (NITROSTAT) 0.4 MG SL tablet Place 1 tablet (0.4 mg total) under Alan tongue every 5 (five) minutes as needed for chest pain.  25 tablet  3  . pregabalin (LYRICA) 75 MG capsule Take 75 mg by mouth daily.       . Red Yeast Rice Extract (RED YEAST RICE PO) Take 1,200 mg by mouth daily.        Allergies  Allergen Reactions  . Rosuvastatin     Past Medical History  Diagnosis Date  . Myocardial infarct 03/21/2008  . PCI (pneumatosis cystoides intestinalis)   . Coronary atherosclerosis of native coronary artery   . Pure hypercholesterolemia   . Phlebitis and thrombophlebitis of superficial vessels of lower extremities   . Gross hematuria   . Cough     Past Surgical History  Procedure Date  . Replacement total knee bilateral   . Carpal tunnel release   . Trigger finger surgery     Family History  Problem Relation Age of Onset  . Cancer Sister   . Cancer Sister     History   Social History  . Marital Status: Married    Spouse Name: N/A    Number of Children: N/A  . Years of Education: N/A   Occupational History  . Not on file.   Social History Main Topics  . Smoking status: Never Smoker   . Smokeless tobacco: Never Used  . Alcohol Use: No  . Drug Use: No  . Sexually Active: Not on file   Other Topics Concern  . Not on file     Social History Narrative   Lives in [place] w/ wife. 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 Denies tobacco, alcohol, or drug use ever in lifetime.    ROS: Please see Alan HPI.  All other systems reviewed and negative.  PHYSICAL EXAM:  BP 130/76  Pulse 48  Ht 5\' 11"  (1.803 m)  Wt 167 lb 1.9 oz (75.805 kg)  BMI 23.31 kg/m2  General: Well developed, well nourished, in no acute distress. Head:  Normocephalic and atraumatic. Neck: no JVD Lungs: Clear to auscultation and percussion. Heart: Normal S1 and S2.  No murmur, rubs or gallops.  Abdomen:  Normal bowel sounds; soft; non tender; no organomegaly Pulses: Pulses normal in all 4 extremities. Extremities: No clubbing or cyanosis. No edema. Neurologic: Alert and oriented x 3.  EKG:  SB.  Low voltage QRS.  Anterolateral MI, old.  IRBBB.  Leftward axis.  No acute changes.   ASSESSMENT AND PLAN:

## 2012-04-02 NOTE — Assessment & Plan Note (Signed)
Want to remain on red yeast rice extract.

## 2012-04-02 NOTE — Assessment & Plan Note (Signed)
Currently controlled.

## 2012-04-02 NOTE — Assessment & Plan Note (Signed)
Back in NSR.  We discussed again anticoagulation, but he had significant bleeding from the bladder, and wants to continue ASA as the primary treatment.

## 2012-04-02 NOTE — Assessment & Plan Note (Signed)
He has not had recurrent chest pain.

## 2012-04-26 NOTE — Addendum Note (Signed)
Addended by: Levora Angel L on: 04/26/2012 01:20 PM   Modules accepted: Orders

## 2012-05-16 ENCOUNTER — Ambulatory Visit (INDEPENDENT_AMBULATORY_CARE_PROVIDER_SITE_OTHER): Payer: Medicare Other | Admitting: Cardiology

## 2012-05-16 ENCOUNTER — Encounter: Payer: Self-pay | Admitting: Cardiology

## 2012-05-16 VITALS — BP 148/80 | HR 51 | Ht 71.0 in | Wt 170.0 lb

## 2012-05-16 DIAGNOSIS — E7800 Pure hypercholesterolemia, unspecified: Secondary | ICD-10-CM

## 2012-05-16 DIAGNOSIS — I4892 Unspecified atrial flutter: Secondary | ICD-10-CM

## 2012-05-16 DIAGNOSIS — I4891 Unspecified atrial fibrillation: Secondary | ICD-10-CM

## 2012-05-16 DIAGNOSIS — I2510 Atherosclerotic heart disease of native coronary artery without angina pectoris: Secondary | ICD-10-CM

## 2012-05-16 MED ORDER — METOPROLOL TARTRATE 25 MG PO TABS: 12.5000 mg | ORAL_TABLET | Freq: Two times a day (BID) | ORAL | Status: DC

## 2012-05-16 NOTE — Assessment & Plan Note (Signed)
No current symptoms

## 2012-05-16 NOTE — Assessment & Plan Note (Addendum)
Maintaining NSR.  Will decrease beta blocker as this might be associated with dizziness.  Does not seem orthostatic, but more positional.  BP is borderline, but prob ok for him.  Will have him back in four weeks or earlier if he has problems. Not on antithrombin. See prior notes.

## 2012-05-16 NOTE — Assessment & Plan Note (Signed)
Does not want statins.

## 2012-05-16 NOTE — Progress Notes (Signed)
   HPI: He is in for follow up.  Doing pretty well, but a little lightheaded with standing.  No chest pain.  No fast rhythm.  Otherwise ok.    Current Outpatient Prescriptions  Medication Sig Dispense Refill  . aspirin 81 MG EC tablet Take 81 mg by mouth daily.        . celecoxib (CELEBREX) 200 MG capsule Take 200 mg by mouth daily.        . metoprolol tartrate (LOPRESSOR) 25 MG tablet Take 0.5 tablets (12.5 mg total) by mouth 2 (two) times daily.  60 tablet  3  . nitroGLYCERIN (NITROSTAT) 0.4 MG SL tablet Place 1 tablet (0.4 mg total) under the tongue every 5 (five) minutes as needed for chest pain.  25 tablet  3  . pregabalin (LYRICA) 75 MG capsule Take 75 mg by mouth daily.       . Red Yeast Rice Extract (RED YEAST RICE PO) Take 1,200 mg by mouth daily.      . [DISCONTINUED] metoprolol tartrate (LOPRESSOR) 25 MG tablet Take 1 tablet (25 mg total) by mouth 2 (two) times daily.  60 tablet  11    Allergies  Allergen Reactions  . Rosuvastatin     Past Medical History  Diagnosis Date  . Myocardial infarct 03/21/2008  . PCI (pneumatosis cystoides intestinalis)   . Coronary atherosclerosis of native coronary artery   . Pure hypercholesterolemia   . Phlebitis and thrombophlebitis of superficial vessels of lower extremities   . Gross hematuria   . Cough     Past Surgical History  Procedure Date  . Replacement total knee bilateral   . Carpal tunnel release   . Trigger finger surgery     Family History  Problem Relation Age of Onset  . Cancer Sister   . Cancer Sister     History   Social History  . Marital Status: Married    Spouse Name: N/A    Number of Children: N/A  . Years of Education: N/A   Occupational History  . Not on file.   Social History Main Topics  . Smoking status: Never Smoker   . Smokeless tobacco: Never Used  . Alcohol Use: No  . Drug Use: No  . Sexually Active: Not on file   Other Topics Concern  . Not on file   Social History Narrative   Lives in [place] w/ wife. 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 Denies tobacco, alcohol, or drug use ever in lifetime.    ROS: Please see the HPI.  All other systems reviewed and negative.  PHYSICAL EXAM:  BP 148/80  Pulse 51  Ht 5\' 11"  (1.803 m)  Wt 170 lb (77.111 kg)  BMI 23.71 kg/m2  SpO2 [id number] [id number] systolic with standing, pulse 52.    General: Well developed, well nourished, in no acute distress. Head:  Normocephalic and atraumatic. Neck: no JVD Lungs: Clear to auscultation and percussion. Heart: Normal S1 and S2.  No murmur, rubs or gallops.  Pulses: Pulses normal in all 4 extremities. Extremities: No clubbing or cyanosis. No edema. Neurologic: Alert and oriented x 3.  EKG:  SB.  PACs Leftward axis.  ASMI old.    ASSESSMENT AND PLAN:

## 2012-05-16 NOTE — Patient Instructions (Addendum)
Your physician has recommended you make the following change in your medication: DECREASE Metoprolol Tartrate to 25mg  take one-half tablet by mouth twice a day  Your physician recommends that you schedule a follow-up appointment in: 4 WEEKS

## 2012-06-16 ENCOUNTER — Ambulatory Visit (INDEPENDENT_AMBULATORY_CARE_PROVIDER_SITE_OTHER): Payer: Medicare Other | Admitting: Cardiology

## 2012-06-16 ENCOUNTER — Encounter: Payer: Self-pay | Admitting: Cardiology

## 2012-06-16 ENCOUNTER — Encounter (INDEPENDENT_AMBULATORY_CARE_PROVIDER_SITE_OTHER): Payer: Medicare Other

## 2012-06-16 VITALS — BP 161/75 | HR 63 | Ht 70.0 in | Wt 170.8 lb

## 2012-06-16 DIAGNOSIS — I2510 Atherosclerotic heart disease of native coronary artery without angina pectoris: Secondary | ICD-10-CM

## 2012-06-16 DIAGNOSIS — I8000 Phlebitis and thrombophlebitis of superficial vessels of unspecified lower extremity: Secondary | ICD-10-CM

## 2012-06-16 DIAGNOSIS — R609 Edema, unspecified: Secondary | ICD-10-CM

## 2012-06-16 DIAGNOSIS — I4891 Unspecified atrial fibrillation: Secondary | ICD-10-CM

## 2012-06-16 DIAGNOSIS — I10 Essential (primary) hypertension: Secondary | ICD-10-CM

## 2012-06-16 MED ORDER — AMLODIPINE BESYLATE 5 MG PO TABS: 2.5000 mg | ORAL_TABLET | Freq: Every day | ORAL | Status: DC

## 2012-06-16 NOTE — Patient Instructions (Addendum)
Your physician has recommended you make the following change in your medication: START Amlodipine 5mg  take one-half tablet by mouth daily  Your physician recommends that you schedule a follow-up appointment in: 1 MONTH with Dr Lia Foyer  Your physician has requested that you have a lower extremity venous duplex. This test is an ultrasound of the veins in the legs. It looks at venous blood flow that carries blood from the heart to the legs. Allow one hour for a Lower Venous exam. There are no restrictions or special instructions.

## 2012-06-17 NOTE — Progress Notes (Addendum)
HPI: The patient return today in a followup visit. From a cardiac standpoint he is doing pretty well. I has some dizziness, which he notes chronically. Fortunately, he seems to have improved overall with the reduction in his beta blocker dose. He does have some concerns today regarding some vein prominence particularly the back of his legs. He pointed these out to me today, and we had him lie on his stomach BP carefully examined his popliteal fossa. Patient denies chest pain.  Current Outpatient Prescriptions  Medication Sig Dispense Refill  . aspirin 81 MG EC tablet Take 81 mg by mouth daily.        . celecoxib (CELEBREX) 200 MG capsule Take 200 mg by mouth daily.        . metoprolol tartrate (LOPRESSOR) 25 MG tablet Take 0.5 tablets (12.5 mg total) by mouth 2 (two) times daily.  60 tablet  3  . nitroGLYCERIN (NITROSTAT) 0.4 MG SL tablet Place 1 tablet (0.4 mg total) under the tongue every 5 (five) minutes as needed for chest pain.  25 tablet  3  . pregabalin (LYRICA) 75 MG capsule Take 75 mg by mouth daily.       . Red Yeast Rice Extract (RED YEAST RICE PO) Take 1,200 mg by mouth daily.      [name] amLODipine (NORVASC) 5 MG tablet Take 0.5 tablets (2.5 mg total) by mouth daily.  30 tablet  11    Allergies  Allergen Reactions  . Rosuvastatin     Past Medical History  Diagnosis Date  . Myocardial infarct 03/21/2008  . PCI (pneumatosis cystoides intestinalis)   . Coronary atherosclerosis of native coronary artery   . Pure hypercholesterolemia   . Phlebitis and thrombophlebitis of superficial vessels of lower extremities   . Gross hematuria   . Cough     Past Surgical History  Procedure Date  . Replacement total knee bilateral   . Carpal tunnel release   . Trigger finger surgery     Family History  Problem Relation Age of Onset  . Cancer Sister   . Cancer Sister     History   Social History  . Marital Status: Married    Spouse Name: N/A    Number of Children: N/A  . Years  of Education: N/A   Occupational History  . Not on file.   Social History Main Topics  . Smoking status: Never Smoker   . Smokeless tobacco: Never Used  . Alcohol Use: No  . Drug Use: No  . Sexually Active: Not on file   Other Topics Concern  . Not on file   Social History Narrative   Lives in [place] w/ wife. 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 Denies tobacco, alcohol, or drug use ever in lifetime.    ROS: Please see the HPI.  All other systems reviewed and negative.  PHYSICAL EXAM:  BP 161/75  Pulse 63  Ht 5\' 10"  (1.778 m)  Wt 170 lb 12.8 oz (77.474 kg)  BMI 24.51 kg/m2  SpO2 97%  BP supine 152/71  Standing 1 157/82  Standing 2 154/78  Standing 3 161/75    Initial sitting 137/67.[name]  No change in pulse with change in position.    General: Well developed, well nourished, in no acute distress. Head:  Normocephalic and atraumatic. Neck: no JVD Lungs: Clear to auscultation and percussion. Heart: Normal S1 and S2.  No murmur, rubs or gallops.  Abdomen:  Normal bowel sounds; soft;  non tender; no organomegaly Pulses: Pulses normal in all 4 extremities. Extremities: No clubbing or cyanosis.  Prominent veins on the posterior aspect of the R knee  (popliteal fossa), and to a lesser extend L leg.   Neurologic: Alert and oriented x 3.  EKG:  NSR.  Left axis deviation.  Delay R wave progression suggestive of prior anterior wall MI.    ASSESSMENT AND PLAN:

## 2012-07-02 NOTE — Assessment & Plan Note (Signed)
Currently back in NSR.  Will continue to monitor.

## 2012-07-02 NOTE — Assessment & Plan Note (Signed)
Not all that well controlled.  Will add lower dose amlodipine to regimen and continue to follow.

## 2012-07-02 NOTE — Assessment & Plan Note (Signed)
No recurrent chest pain. 

## 2012-07-02 NOTE — Assessment & Plan Note (Signed)
Will check venous doppler to rule out DVT and check status.

## 2012-07-18 ENCOUNTER — Ambulatory Visit (INDEPENDENT_AMBULATORY_CARE_PROVIDER_SITE_OTHER): Payer: Medicare Other | Admitting: Cardiology

## 2012-07-18 ENCOUNTER — Encounter: Payer: Self-pay | Admitting: Cardiology

## 2012-07-18 VITALS — BP 158/80 | HR 59 | Ht 71.0 in | Wt 174.0 lb

## 2012-07-18 DIAGNOSIS — I4891 Unspecified atrial fibrillation: Secondary | ICD-10-CM

## 2012-07-18 DIAGNOSIS — I2510 Atherosclerotic heart disease of native coronary artery without angina pectoris: Secondary | ICD-10-CM

## 2012-07-18 DIAGNOSIS — I10 Essential (primary) hypertension: Secondary | ICD-10-CM

## 2012-07-18 MED ORDER — AMLODIPINE BESYLATE 5 MG PO TABS: 5.0000 mg | ORAL_TABLET | Freq: Every day | ORAL | Status: DC

## 2012-07-18 NOTE — Patient Instructions (Addendum)
Your physician recommends that you schedule a follow-up appointment in: 6 weeks with Dr. Lia Foyer.  Increase Amlodipine to a full tablet (5mg ) daily.

## 2012-07-18 NOTE — Assessment & Plan Note (Signed)
Still modestly elevated.  Will increase amlodipine to 5mg  once daily and have him return in about six weeks.

## 2012-07-18 NOTE — Assessment & Plan Note (Signed)
NO ECG today but sounds like NSR---regular with rate of 66 by me

## 2012-07-18 NOTE — Progress Notes (Signed)
   HPI:  The patient is seen today in follow up.  He is doing pretty well.  He has occasional dizziness when he gets up, but nothing new.  Feels ok in general.    Current Outpatient Prescriptions  Medication Sig Dispense Refill  . amLODipine (NORVASC) 5 MG tablet Take 1 tablet (5 mg total) by mouth daily.  30 tablet  11  . aspirin 81 MG EC tablet Take 81 mg by mouth daily.        . celecoxib (CELEBREX) 200 MG capsule Take 200 mg by mouth daily.        . metoprolol tartrate (LOPRESSOR) 25 MG tablet Take 0.5 tablets (12.5 mg total) by mouth 2 (two) times daily.  60 tablet  3  . pregabalin (LYRICA) 75 MG capsule Take 75 mg by mouth daily.       . Red Yeast Rice Extract (RED YEAST RICE PO) Take 1,200 mg by mouth daily.      . nitroGLYCERIN (NITROSTAT) 0.4 MG SL tablet Place 1 tablet (0.4 mg total) under the tongue every 5 (five) minutes as needed for chest pain.  25 tablet  3    Allergies  Allergen Reactions  . Rosuvastatin     Past Medical History  Diagnosis Date  . Myocardial infarct 03/21/2008  . PCI (pneumatosis cystoides intestinalis)   . Coronary atherosclerosis of native coronary artery   . Pure hypercholesterolemia   . Phlebitis and thrombophlebitis of superficial vessels of lower extremities   . Gross hematuria   . Cough     Past Surgical History  Procedure Date  . Replacement total knee bilateral   . Carpal tunnel release   . Trigger finger surgery     Family History  Problem Relation Age of Onset  . Cancer Sister   . Cancer Sister     History   Social History  . Marital Status: Married    Spouse Name: N/A    Number of Children: N/A  . Years of Education: N/A   Occupational History  . Not on file.   Social History Main Topics  . Smoking status: Never Smoker   . Smokeless tobacco: Never Used  . Alcohol Use: No  . Drug Use: No  . Sexually Active: Not on file   Other Topics Concern  . Not on file   Social History Narrative   Lives in [place] w/  wife. 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 Denies tobacco, alcohol, or drug use ever in lifetime.    ROS: Please see the HPI.  All other systems reviewed and negative.  PHYSICAL EXAM:  BP 158/80  Pulse 59  Ht 5\' 11"  (1.803 m)  Wt 174 lb (78.926 kg)  BMI 24.27 kg/m2  SpO2 95%  General: Well developed, well nourished, in no acute distress. Head:  Normocephalic and atraumatic. Neck: no JVD Lungs: Clear to auscultation and percussion. Heart: Normal S1 and S2.  No murmur, rubs or gallops.  Extremities: No clubbing or cyanosis. No edema. Neurologic: Alert and oriented x 3.  EKG:  ASSESSMENT AND PLAN:

## 2012-08-17 ENCOUNTER — Encounter: Payer: Self-pay | Admitting: Cardiology

## 2012-08-26 ENCOUNTER — Other Ambulatory Visit: Payer: Self-pay

## 2012-08-28 ENCOUNTER — Encounter: Payer: Self-pay | Admitting: Cardiology

## 2012-08-28 ENCOUNTER — Ambulatory Visit (INDEPENDENT_AMBULATORY_CARE_PROVIDER_SITE_OTHER): Payer: Medicare Other | Admitting: Cardiology

## 2012-08-28 VITALS — BP 140/78 | HR 57 | Ht 71.5 in | Wt 170.0 lb

## 2012-08-28 DIAGNOSIS — R310 Gross hematuria: Secondary | ICD-10-CM

## 2012-08-28 DIAGNOSIS — I2510 Atherosclerotic heart disease of native coronary artery without angina pectoris: Secondary | ICD-10-CM

## 2012-08-28 DIAGNOSIS — I4891 Unspecified atrial fibrillation: Secondary | ICD-10-CM

## 2012-08-28 DIAGNOSIS — E7800 Pure hypercholesterolemia, unspecified: Secondary | ICD-10-CM

## 2012-08-28 NOTE — Patient Instructions (Signed)
Your physician recommends that you schedule a follow-up appointment in: 2-3 MONTHS with Dr Angelena Form (previous pt of Dr Lia Foyer)  Your physician recommends that you continue on your current medications as directed. Please refer to the Current Medication list given to you today.

## 2012-08-28 NOTE — Assessment & Plan Note (Signed)
Followed in Urology.  The primary reason antithrombins not used.

## 2012-08-28 NOTE — Assessment & Plan Note (Signed)
He's not had a recurrence. Had problems with anticoagulation the past, particularly with the urinary issue. He did not want to be on anticoagulation, so he is continued on single aspirin daily. He's not had further episodes of atrial fib.

## 2012-08-28 NOTE — Assessment & Plan Note (Signed)
He did not want take a statin, so he is continued on red yeast rice.

## 2012-08-28 NOTE — Assessment & Plan Note (Signed)
The patient continues to remain stable from a cardiac standpoint. He remains on medical therapy. He has significant underlying coronary disease, but is been stable on medical regimen.

## 2012-08-28 NOTE — Progress Notes (Signed)
   HPI:  This very nice patient is in today for followup. His maximum bothering him, and he has been based on Celebrex. I reviewed the issues potentially associated with this so that he was aware of that. He does say that it is helped his back. He's not currently having any chest pain or major cardiac symptoms. Overall he feels well.  Current Outpatient Prescriptions  Medication Sig Dispense Refill  . amLODipine (NORVASC) 5 MG tablet Take 1 tablet (5 mg total) by mouth daily.  30 tablet  11  . aspirin 81 MG EC tablet Take 81 mg by mouth daily.        . celecoxib (CELEBREX) 200 MG capsule Take 200 mg by mouth daily.        . metoprolol tartrate (LOPRESSOR) 25 MG tablet Take 0.5 tablets (12.5 mg total) by mouth 2 (two) times daily.  60 tablet  3  . nitroGLYCERIN (NITROSTAT) 0.4 MG SL tablet Place 0.4 mg under the tongue every 5 (five) minutes as needed for chest pain.      . pregabalin (LYRICA) 75 MG capsule Take 75 mg by mouth daily.       . Red Yeast Rice Extract (RED YEAST RICE PO) Take 1,200 mg by mouth daily.       No current facility-administered medications for this visit.    Allergies  Allergen Reactions  . Rosuvastatin     Past Medical History  Diagnosis Date  . Myocardial infarct 03/21/2008  . PCI (pneumatosis cystoides intestinalis)   . Coronary atherosclerosis of native coronary artery   . Pure hypercholesterolemia   . Phlebitis and thrombophlebitis of superficial vessels of lower extremities   . Gross hematuria   . Cough     Past Surgical History  Procedure Laterality Date  . Replacement total knee bilateral    . Carpal tunnel release    . Trigger finger surgery      Family History  Problem Relation Age of Onset  . Cancer Sister   . Cancer Sister     History   Social History  . Marital Status: Married    Spouse Name: N/A    Number of Children: N/A  . Years of Education: N/A   Occupational History  . Not on file.   Social History Main Topics  .  Smoking status: Never Smoker   . Smokeless tobacco: Never Used  . Alcohol Use: No  . Drug Use: No  . Sexually Active: Not on file   Other Topics Concern  . Not on file   Social History Narrative   Lives in [place] w/ wife. 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 Denies tobacco, alcohol, or drug use ever in lifetime.    ROS: Please see the HPI.  All other systems reviewed and negative.  PHYSICAL EXAM:  BP 140/78  Pulse 57  Ht 5' 11.5" (1.816 m)  Wt 170 lb (77.111 kg)  BMI 23.38 kg/m2  SpO2 99%  General: Well developed, well nourished, in no acute distress. Head:  Normocephalic and atraumatic. Neck: no JVD Lungs: Clear to auscultation and percussion. Heart: Normal S1 and S2.  No, rubs or gallops. Minimal AI murmur Abdomen:  Normal bowel sounds; soft; non tender; no organomegaly Pulses: Pulses normal in all 4 extremities. Extremities: No clubbing or cyanosis. No edema. Neurologic: Alert and oriented x 3.  EKG:  SB.  IRBBB.  LAFB.  Anterior MI, age indeterminate.    ASSESSMENT AND PLAN:

## 2012-08-29 ENCOUNTER — Other Ambulatory Visit: Payer: Self-pay | Admitting: *Deleted

## 2012-08-29 DIAGNOSIS — I4891 Unspecified atrial fibrillation: Secondary | ICD-10-CM

## 2012-08-29 DIAGNOSIS — I2510 Atherosclerotic heart disease of native coronary artery without angina pectoris: Secondary | ICD-10-CM

## 2012-08-29 MED ORDER — AMLODIPINE BESYLATE 5 MG PO TABS: 5.0000 mg | ORAL_TABLET | Freq: Every day | ORAL | Status: DC

## 2012-08-29 NOTE — Telephone Encounter (Signed)
Patient to refill Amlodipine 5mg  1 tablet by mouth daily to [facility]. Pharmacy told him they never received rx approval Resending Amlodipine 5mg  , 1 tablet daily by mouth, #30, 11 refills as original rx 08-10-12   Marlis Edelson CMA

## 2012-10-31 ENCOUNTER — Encounter: Payer: Self-pay | Admitting: Cardiovascular Disease

## 2012-10-31 ENCOUNTER — Ambulatory Visit (INDEPENDENT_AMBULATORY_CARE_PROVIDER_SITE_OTHER): Payer: Medicare Other | Admitting: Cardiovascular Disease

## 2012-10-31 VITALS — BP 138/78 | HR 63 | Ht 70.0 in | Wt 173.0 lb

## 2012-10-31 DIAGNOSIS — E785 Hyperlipidemia, unspecified: Secondary | ICD-10-CM

## 2012-10-31 DIAGNOSIS — I2510 Atherosclerotic heart disease of native coronary artery without angina pectoris: Secondary | ICD-10-CM

## 2012-10-31 DIAGNOSIS — I4892 Unspecified atrial flutter: Secondary | ICD-10-CM

## 2012-10-31 NOTE — Patient Instructions (Addendum)
Your physician recommends that you schedule a follow-up appointment in:  4 months-March 07, 2013 at 3:30

## 2012-10-31 NOTE — Progress Notes (Signed)
History of Present Illness: 79 yo male with history of CAD, HLD,  atrial flutter who is here today for cardiac follow up. He has been followed in the past by Dr. Lia Foyer and I am meeting him for the first time today. His CAD has been stable. Abnormal EKG in July 2013 concerning for atrial flutter. He was seen in the EP clinic by Dr. Rayann Heman and event monitor showed sinus brady only with no pauses. Not felt to be a candidate for long term anti-coagulation secondary to hematuria. He has not tolerated statins in the past. He   He is here today for follow up. He tells me that he has been feeling well. No chest pain or SOB. No palpitations. Tolerating all meds.   Primary Care Physician: Nelda Bucks  Last Lipid Profile: Followed in primary care.    Past Medical History  Diagnosis Date  . Myocardial infarct 03/21/2008  . Coronary atherosclerosis of native coronary artery     Last cath 2009  . Pure hypercholesterolemia   . Phlebitis and thrombophlebitis of superficial vessels of lower extremities   . Gross hematuria   . Cough     Past Surgical History  Procedure Laterality Date  . Replacement total knee bilateral    . Carpal tunnel release    . Trigger finger surgery      Current Outpatient Prescriptions  Medication Sig Dispense Refill  . amLODipine (NORVASC) 5 MG tablet Take 1 tablet (5 mg total) by mouth daily.  30 tablet  11  . aspirin 81 MG EC tablet Take 81 mg by mouth daily.        . celecoxib (CELEBREX) 200 MG capsule Take 200 mg by mouth daily.        . metoprolol tartrate (LOPRESSOR) 25 MG tablet Take 0.5 tablets (12.5 mg total) by mouth 2 (two) times daily.  60 tablet  3  . nitroGLYCERIN (NITROSTAT) 0.4 MG SL tablet Place 0.4 mg under the tongue every 5 (five) minutes as needed for chest pain.      . pregabalin (LYRICA) 75 MG capsule Take 75 mg by mouth daily.       . Red Yeast Rice Extract (RED YEAST RICE PO) Take 1,200 mg by mouth daily.       No current  facility-administered medications for this visit.    Allergies  Allergen Reactions  . Rosuvastatin     History   Social History  . Marital Status: Married    Spouse Name: N/A    Number of Children: N/A  . Years of Education: N/A   Occupational History  . Not on file.   Social History Main Topics  . Smoking status: Never Smoker   . Smokeless tobacco: Never Used  . Alcohol Use: No  . Drug Use: No  . Sexually Active: Not on file   Other Topics Concern  . Not on file   Social History Narrative   Lives in [place] w/ wife. 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 Denies tobacco, alcohol, or drug use ever in lifetime.    Family History  Problem Relation Age of Onset  . Cancer Sister   . Cancer Sister     Review of Systems:  As stated in the HPI and otherwise negative.   BP 138/78  Pulse 63  Ht 5\' 10"  (1.778 m)  Wt 173 lb (78.472 kg)  BMI 24.82 kg/m2  Physical Examination: General: Well developed, well nourished, NAD HEENT: OP clear,  mucus membranes moist SKIN: warm, dry. No rashes. Neuro: No focal deficits Musculoskeletal: Muscle strength 5/5 all ext Psychiatric: Mood and affect normal Neck: No JVD, no carotid bruits, no thyromegaly, no lymphadenopathy. Lungs:Clear bilaterally, no wheezes, rhonci, crackles Cardiovascular: Regular rate and rhythm. No murmurs, gallops or rubs. Abdomen:Soft. Bowel sounds present. Non-tender.  Extremities: No lower extremity edema. Pulses are 2 + in the bilateral DP/PT.  Assessment and Plan:   1. CAD: Stable. No changes. He is tolerating ASA and Lopressor.   2. Atrial arrythmia: No palpitations. Appears sinus today. Continue beta blocker. Not felt to be a candidate for anti-coagulation.   3. Hyperlipidemia: Followed in primary care. Last LDL 81 in July 2013

## 2012-11-26 ENCOUNTER — Telehealth: Payer: Self-pay | Admitting: Physician Assistant

## 2012-11-26 NOTE — Telephone Encounter (Signed)
Mr. Baire daughter-in-law called this Sunday morning with questions about her father-in-law's vitals and symptoms. She was concerned because HR was 80, BP 122/67 (HR usually runs 60s, occasionally 70s). I reassured her these are actually very good vital signs. However, she is also concerned because he had an episode of nausea and vomiting this morning. He seems to be feeling somewhat better but still just doesn't feel great. These symptoms remind her of his prior heart symptoms, although less severe. This is more worrisome to me. He is not having any CP, sob, fever, chills or any other symptoms. Given that I cannot full evaluate him over the phone, I advised that if they are concerned about his symptoms then he should proceed to either urgent care or ER for evaluation. She verbalized understanding and gratitude. Dayna Dunn PA-C

## 2013-02-14 ENCOUNTER — Other Ambulatory Visit: Payer: Self-pay

## 2013-02-15 ENCOUNTER — Encounter: Payer: Self-pay | Admitting: Cardiovascular Disease

## 2013-02-20 ENCOUNTER — Other Ambulatory Visit: Payer: Self-pay | Admitting: Cardiology

## 2013-03-07 ENCOUNTER — Ambulatory Visit (INDEPENDENT_AMBULATORY_CARE_PROVIDER_SITE_OTHER): Payer: Medicare Other | Admitting: Cardiovascular Disease

## 2013-03-07 ENCOUNTER — Encounter: Payer: Self-pay | Admitting: Cardiovascular Disease

## 2013-03-07 VITALS — BP 160/72 | HR 61 | Ht 70.0 in | Wt 169.0 lb

## 2013-03-07 DIAGNOSIS — I2510 Atherosclerotic heart disease of native coronary artery without angina pectoris: Secondary | ICD-10-CM

## 2013-03-07 DIAGNOSIS — I4891 Unspecified atrial fibrillation: Secondary | ICD-10-CM

## 2013-03-07 NOTE — Patient Instructions (Addendum)
Your physician wants you to follow-up in:  6 months. You will receive a reminder letter in the mail two months in advance. If you don't receive a letter, please call our office to schedule the follow-up appointment.   

## 2013-03-07 NOTE — Progress Notes (Signed)
History of Present Illness: 79 yo male with history of CAD, HLD, atrial flutter who is here today for cardiac follow up. He has been followed in the past by Dr. Lia Foyer and I am meeting him for the first time today. His CAD has been stable. Last cath 2009. Abnormal EKG in July 2013 concerning for atrial flutter. He was seen in the EP clinic by Dr. Rayann Heman and event monitor showed sinus brady only with no pauses. Not felt to be a candidate for long term anti-coagulation secondary to hematuria. He has not tolerated statins in the past.   He is here today for follow up. He tells me that he has been feeling well. No chest pain or SOB. No palpitations. Tolerating all meds.   Primary Care Physician: Nelda Bucks   Last Lipid Profile: Followed in primary care.   Past Medical History  Diagnosis Date  . Myocardial infarct 03/21/2008  . Coronary atherosclerosis of native coronary artery     Last cath 2009  . Pure hypercholesterolemia   . Phlebitis and thrombophlebitis of superficial vessels of lower extremities   . Gross hematuria   . Cough     Past Surgical History  Procedure Laterality Date  . Replacement total knee bilateral    . Carpal tunnel release    . Trigger finger surgery      Current Outpatient Prescriptions  Medication Sig Dispense Refill  . amLODipine (NORVASC) 5 MG tablet Take 1 tablet (5 mg total) by mouth daily.  30 tablet  11  . aspirin 81 MG EC tablet Take 81 mg by mouth daily.        . celecoxib (CELEBREX) 200 MG capsule Take 200 mg by mouth daily.        . metoprolol tartrate (LOPRESSOR) 25 MG tablet TAKE 1/2 TABLET BY MOUTH 2 TIMES DAILY  60 tablet  0  . nitroGLYCERIN (NITROSTAT) 0.4 MG SL tablet Place 0.4 mg under the tongue every 5 (five) minutes as needed for chest pain.      . pregabalin (LYRICA) 75 MG capsule Take 75 mg by mouth daily.       . Red Yeast Rice Extract (RED YEAST RICE PO) Take 1,200 mg by mouth daily.       No current facility-administered  medications for this visit.    Allergies  Allergen Reactions  . Rosuvastatin     History   Social History  . Marital Status: Married    Spouse Name: N/A    Number of Children: N/A  . Years of Education: N/A   Occupational History  . Not on file.   Social History Main Topics  . Smoking status: Never Smoker   . Smokeless tobacco: Never Used  . Alcohol Use: No  . Drug Use: No  . Sexual Activity: Not on file   Other Topics Concern  . Not on file   Social History Narrative   Lives in [place] w/ wife. 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 Denies tobacco, alcohol, or drug use ever in lifetime.    Family History  Problem Relation Age of Onset  . Cancer Sister   . Cancer Sister     Review of Systems:  As stated in the HPI and otherwise negative.   BP 160/72  Pulse 61  Ht 5\' 10"  (1.778 m)  Wt 169 lb (76.658 kg)  BMI 24.25 kg/m2  Physical Examination: General: Well developed, well nourished, NAD HEENT: OP clear, mucus membranes moist  SKIN: warm, dry. No rashes. Neuro: No focal deficits Musculoskeletal: Muscle strength 5/5 all ext Psychiatric: Mood and affect normal Neck: No JVD, no carotid bruits, no thyromegaly, no lymphadenopathy. Lungs:Clear bilaterally, no wheezes, rhonci, crackles Cardiovascular: Regular rate and rhythm. No murmurs, gallops or rubs. Abdomen:Soft. Bowel sounds present. Non-tender.  Extremities: No lower extremity edema. Pulses are 2 + in the bilateral DP/PT.  Cardiac cath 03/21/08:  1. Ventriculography in the RAO projection reveals hypo-to-akinesis of  the anteroapical segment. The ejection fraction estimate will be  45%.  2. The left main is free of critical disease.  3. The LAD has a 60% narrowing at the septal. There is some mild  plaquing distal to this and then just after another septal, there  is a 90% narrowing, then total occlusion after a small diagonal.  The extent of disease extends into just beyond the  next diagonal.  The proximal lesion was covered with a 2.5-mm stent with reduction  from 60%-0%. The mid LAD, which was the site of total occlusion  was reduced from 90% and 100% down to 0%. The distal lesion was  80%-90% in the distal vessel and reduced from 80%-90% to less than  20% residual narrowing with smooth angiographic result and good  runoff into the distal vessel.  4. The circumflex provides a large marginal branch with diffuse  segmental 60% narrowing followed by a large marginal branch. There  was a tiny subbranch with 90% narrowing and an inferior subbranch  with perhaps 60% narrowing.  5. The right coronary artery is segmentally plaqued in the midvessel  and then totally occluded after an RV branch. This RV branch then  collateralizes the distal vessel. The distal vessel was supplied  by the LAD septals.   Assessment and Plan:   1. CAD: Stable. No changes. He is tolerating ASA and Lopressor.   2. Atrial arrythmia: No palpitations. Appears sinus today. Continue beta blocker. Not felt to be a candidate for anti-coagulation.   3. Hyperlipidemia: Followed in primary care. Last LDL 81 in July 2013

## 2013-04-06 DIAGNOSIS — R200 Anesthesia of skin: Secondary | ICD-10-CM | POA: Insufficient documentation

## 2013-04-06 DIAGNOSIS — M79642 Pain in left hand: Secondary | ICD-10-CM | POA: Insufficient documentation

## 2013-04-06 DIAGNOSIS — G5602 Carpal tunnel syndrome, left upper limb: Secondary | ICD-10-CM | POA: Insufficient documentation

## 2013-04-23 DIAGNOSIS — I498 Other specified cardiac arrhythmias: Secondary | ICD-10-CM | POA: Insufficient documentation

## 2013-04-23 DIAGNOSIS — E785 Hyperlipidemia, unspecified: Secondary | ICD-10-CM

## 2013-04-23 HISTORY — DX: Hyperlipidemia, unspecified: E78.5

## 2013-05-05 ENCOUNTER — Other Ambulatory Visit: Payer: Self-pay | Admitting: Cardiovascular Disease

## 2013-05-17 ENCOUNTER — Other Ambulatory Visit: Payer: Self-pay

## 2013-08-23 ENCOUNTER — Encounter: Payer: Self-pay | Admitting: Cardiovascular Disease

## 2013-08-25 ENCOUNTER — Other Ambulatory Visit: Payer: Self-pay | Admitting: Cardiology

## 2013-09-03 ENCOUNTER — Other Ambulatory Visit: Payer: Self-pay | Admitting: Cardiovascular Disease

## 2013-09-24 ENCOUNTER — Other Ambulatory Visit: Payer: Self-pay | Admitting: *Deleted

## 2013-09-24 MED ORDER — AMLODIPINE BESYLATE 5 MG PO TABS: ORAL_TABLET | ORAL | Status: DC

## 2013-09-25 ENCOUNTER — Other Ambulatory Visit: Payer: Self-pay

## 2013-09-25 MED ORDER — AMLODIPINE BESYLATE 5 MG PO TABS: ORAL_TABLET | ORAL | Status: DC

## 2013-10-23 ENCOUNTER — Ambulatory Visit: Payer: Medicare Other | Admitting: Cardiovascular Disease

## 2013-10-26 ENCOUNTER — Ambulatory Visit (INDEPENDENT_AMBULATORY_CARE_PROVIDER_SITE_OTHER): Payer: Medicare Other | Admitting: Cardiovascular Disease

## 2013-10-26 ENCOUNTER — Encounter: Payer: Self-pay | Admitting: Cardiovascular Disease

## 2013-10-26 VITALS — BP 122/80 | HR 51 | Ht 71.0 in | Wt 165.0 lb

## 2013-10-26 DIAGNOSIS — I4892 Unspecified atrial flutter: Secondary | ICD-10-CM

## 2013-10-26 DIAGNOSIS — E7800 Pure hypercholesterolemia, unspecified: Secondary | ICD-10-CM

## 2013-10-26 DIAGNOSIS — I2510 Atherosclerotic heart disease of native coronary artery without angina pectoris: Secondary | ICD-10-CM

## 2013-10-26 NOTE — Progress Notes (Signed)
History of Present Illness: 80 yo male with history of CAD, HLD, atrial flutter who is here today for cardiac follow up. He has been followed in the past by Dr. Lia Foyer. I met him for the first time in 03/07/14. His CAD has been stable. Last cath 2009. Abnormal EKG in July 2013 concerning for atrial flutter. He was seen in the EP clinic by Dr. Rayann Heman and event monitor showed sinus brady only with no pauses. Not felt to be a candidate for long term anti-coagulation secondary to hematuria. He has not tolerated statins in the past.   He is here today for follow up. He tells me that he has been feeling well. No chest pain or SOB. No palpitations. Tolerating all meds.   Primary Care Physician: Nelda Bucks   Last Lipid Profile: Followed in primary care.   Past Medical History  Diagnosis Date  . Myocardial infarct 03/21/2008  . Coronary atherosclerosis of native coronary artery     Last cath 2009  . Pure hypercholesterolemia   . Phlebitis and thrombophlebitis of superficial vessels of lower extremities   . Gross hematuria   . Cough     Past Surgical History  Procedure Laterality Date  . Replacement total knee bilateral    . Carpal tunnel release    . Trigger finger surgery      Current Outpatient Prescriptions  Medication Sig Dispense Refill  . amLODipine (NORVASC) 5 MG tablet TAKE 1 TABLET (5 MG TOTAL) BY MOUTH DAILY.  30 tablet  0  . aspirin 81 MG EC tablet Take 81 mg by mouth daily.        . celecoxib (CELEBREX) 200 MG capsule Take 200 mg by mouth daily.        . metoprolol tartrate (LOPRESSOR) 25 MG tablet TAKE 1/2 TABLET BY MOUTH 2 TIMES DAILY  60 tablet  1  . nitroGLYCERIN (NITROSTAT) 0.4 MG SL tablet Place 0.4 mg under the tongue every 5 (five) minutes as needed for chest pain.      . pregabalin (LYRICA) 75 MG capsule Take 75 mg by mouth daily.       . Red Yeast Rice Extract (RED YEAST RICE PO) Take 1,200 mg by mouth daily.       No current facility-administered  medications for this visit.    Allergies  Allergen Reactions  . Rosuvastatin     History   Social History  . Marital Status: Married    Spouse Name: N/A    Number of Children: N/A  . Years of Education: N/A   Occupational History  . Not on file.   Social History Main Topics  . Smoking status: Never Smoker   . Smokeless tobacco: Never Used  . Alcohol Use: No  . Drug Use: No  . Sexual Activity: Not on file   Other Topics Concern  . Not on file   Social History Narrative   Lives in [place] w/ wife. 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 Denies tobacco, alcohol, or drug use ever in lifetime.    Family History  Problem Relation Age of Onset  . Cancer Sister   . Cancer Sister     Review of Systems:  As stated in the HPI and otherwise negative.   BP 122/80  Pulse 51  Ht 5' 11"  (1.803 m)  Wt 165 lb (74.844 kg)  BMI 23.02 kg/m2  Physical Examination: General: Well developed, well nourished, NAD HEENT: OP clear, mucus membranes moist  SKIN: warm, dry. No rashes. Neuro: No focal deficits Musculoskeletal: Muscle strength 5/5 all ext Psychiatric: Mood and affect normal Neck: No JVD, no carotid bruits, no thyromegaly, no lymphadenopathy. Lungs:Clear bilaterally, no wheezes, rhonci, crackles Cardiovascular: Regular rate and rhythm. No murmurs, gallops or rubs. Abdomen:Soft. Bowel sounds present. Non-tender.  Extremities: No lower extremity edema. Pulses are 2 + in the bilateral DP/PT.  Cardiac cath 03/21/08:  1. Ventriculography in the RAO projection reveals hypo-to-akinesis of  the anteroapical segment. The ejection fraction estimate will be  45%.  2. The left main is free of critical disease.  3. The LAD has a 60% narrowing at the septal. There is some mild  plaquing distal to this and then just after another septal, there  is a 90% narrowing, then total occlusion after a small diagonal.  The extent of disease extends into just beyond the  next diagonal.  The proximal lesion was covered with a 2.5-mm stent with reduction  from 60%-0%. The mid LAD, which was the site of total occlusion  was reduced from 90% and 100% down to 0%. The distal lesion was  80%-90% in the distal vessel and reduced from 80%-90% to less than  20% residual narrowing with smooth angiographic result and good  runoff into the distal vessel.  4. The circumflex provides a large marginal branch with diffuse  segmental 60% narrowing followed by a large marginal branch. There  was a tiny subbranch with 90% narrowing and an inferior subbranch  with perhaps 60% narrowing.  5. The right coronary artery is segmentally plaqued in the midvessel  and then totally occluded after an RV branch. This RV branch then  collateralizes the distal vessel. The distal vessel was supplied  by the LAD septals.  EKG: sinus brady, rate 51 bpm. Incomplete RBBB. LAFB. Poor R wave progression through precordial leads.   Assessment and Plan:   1. CAD: Stable. No changes. He is tolerating ASA and Lopressor.   2. Atrial arrythmia: No palpitations. Sinus today. Continue beta blocker. Not felt to be a candidate for anti-coagulation.   3. Hyperlipidemia: Followed in primary care. Continue statin.

## 2013-10-26 NOTE — Patient Instructions (Signed)
Your physician wants you to follow-up in:  6 months. You will receive a reminder letter in the mail two months in advance. If you don't receive a letter, please call our office to schedule the follow-up appointment.   

## 2013-11-26 ENCOUNTER — Other Ambulatory Visit: Payer: Self-pay | Admitting: Cardiovascular Disease

## 2013-12-30 ENCOUNTER — Other Ambulatory Visit: Payer: Self-pay | Admitting: Cardiovascular Disease

## 2014-02-04 ENCOUNTER — Emergency Department (HOSPITAL_COMMUNITY)
Admission: EM | Admit: 2014-02-04 | Discharge: 2014-02-04 | Disposition: A | Discharge status: Home / Self Care | Payer: Medicare Other | Attending: Emergency Medicine | Admitting: Emergency Medicine

## 2014-02-04 ENCOUNTER — Encounter (HOSPITAL_COMMUNITY): Payer: Self-pay | Admitting: Emergency Medicine

## 2014-02-04 DIAGNOSIS — Z7982 Long term (current) use of aspirin: Secondary | ICD-10-CM | POA: Diagnosis not present

## 2014-02-04 DIAGNOSIS — M7989 Other specified soft tissue disorders: Secondary | ICD-10-CM

## 2014-02-04 DIAGNOSIS — I82401 Acute embolism and thrombosis of unspecified deep veins of right lower extremity: Secondary | ICD-10-CM

## 2014-02-04 DIAGNOSIS — Z79899 Other long term (current) drug therapy: Secondary | ICD-10-CM | POA: Insufficient documentation

## 2014-02-04 DIAGNOSIS — Z791 Long term (current) use of non-steroidal anti-inflammatories (NSAID): Secondary | ICD-10-CM | POA: Diagnosis not present

## 2014-02-04 DIAGNOSIS — Z8639 Personal history of other endocrine, nutritional and metabolic disease: Secondary | ICD-10-CM | POA: Insufficient documentation

## 2014-02-04 DIAGNOSIS — I82409 Acute embolism and thrombosis of unspecified deep veins of unspecified lower extremity: Secondary | ICD-10-CM | POA: Insufficient documentation

## 2014-02-04 DIAGNOSIS — I252 Old myocardial infarction: Secondary | ICD-10-CM | POA: Insufficient documentation

## 2014-02-04 DIAGNOSIS — M25569 Pain in unspecified knee: Secondary | ICD-10-CM | POA: Insufficient documentation

## 2014-02-04 DIAGNOSIS — Z87448 Personal history of other diseases of urinary system: Secondary | ICD-10-CM | POA: Insufficient documentation

## 2014-02-04 DIAGNOSIS — I2510 Atherosclerotic heart disease of native coronary artery without angina pectoris: Secondary | ICD-10-CM | POA: Diagnosis not present

## 2014-02-04 DIAGNOSIS — Z862 Personal history of diseases of the blood and blood-forming organs and certain disorders involving the immune mechanism: Secondary | ICD-10-CM | POA: Diagnosis not present

## 2014-02-04 DIAGNOSIS — M79609 Pain in unspecified limb: Secondary | ICD-10-CM

## 2014-02-04 LAB — I-STAT CHEM 8, ED
BUN: 25 mg/dL — ABNORMAL HIGH (ref 6–23)
Calcium, Ion: 1.21 mmol/L (ref 1.13–1.30)
Chloride: 102 mEq/L (ref 96–112)
Creatinine, Ser: 1.2 mg/dL (ref 0.50–1.35)
GLUCOSE: 95 mg/dL (ref 70–99)
HCT: 44 % (ref 39.0–52.0)
HEMOGLOBIN: 15 g/dL (ref 13.0–17.0)
Potassium: 4.2 mEq/L (ref 3.7–5.3)
SODIUM: 140 meq/L (ref 137–147)
TCO2: 27 mmol/L (ref 0–100)

## 2014-02-04 MED ORDER — RIVAROXABAN 15 MG PO TABS
15.0000 mg | ORAL_TABLET | Freq: Once | ORAL | Status: AC
  Administered 2014-02-04: 15 mg via ORAL
  Filled 2014-02-04: qty 1

## 2014-02-04 MED ORDER — XARELTO VTE STARTER PACK 15 & 20 MG PO TBPK: 15.0000 mg | ORAL_TABLET | ORAL | Status: DC

## 2014-02-04 NOTE — ED Provider Notes (Signed)
TIME SEEN: 8:20 PM  CHIEF COMPLAINT: Right posterior knee pain  HPI: Patient is a 80 year old male with history of coronary artery disease, hyperlipidemia, prior superficial thrombophlebitis who presents to the emergency department with pain to the right posterior knee and right lower [place] he swelling that started today. He denies a history of injury. No recent prolonged immobilization, fracture, surgery, trauma. No numbness, tingling or weakness. No fever. No chest pain or shortness of breath. He he states he put compression stockings on at home and his are feeling much better and has no complaints other than feeling a "knot" behind his knee. He is not currently on anticoagulation.  ROS: See HPI Constitutional: no fever  Eyes: no drainage  ENT: no runny nose   Cardiovascular:  no chest pain  Resp: no SOB  GI: no vomiting GU: no dysuria Integumentary: no rash  Allergy: no hives  Musculoskeletal: Right leg swelling  Neurological: no slurred speech ROS otherwise negative  PAST MEDICAL HISTORY/PAST SURGICAL HISTORY:  Past Medical History  Diagnosis Date  . Myocardial infarct 03/21/2008  . Coronary atherosclerosis of native coronary artery     Last cath 2009  . Pure hypercholesterolemia   . Phlebitis and thrombophlebitis of superficial vessels of lower extremities   . Gross hematuria   . Cough     MEDICATIONS:  Prior to Admission medications   Medication Sig Start Date End Date Taking? Authorizing Provider  amLODipine (NORVASC) 5 MG tablet TAKE 1 TABLET (5 MG TOTAL) BY MOUTH DAILY. 09/25/13   Burnell Blanks, MD  amLODipine (NORVASC) 5 MG tablet TAKE 1 TABLET (5 MG TOTAL) BY MOUTH DAILY.    Burnell Blanks, MD  aspirin 81 MG EC tablet Take 81 mg by mouth daily.      Historical Provider, MD  celecoxib (CELEBREX) 200 MG capsule Take 200 mg by mouth daily.      Historical Provider, MD  metoprolol tartrate (LOPRESSOR) 25 MG tablet TAKE 1/2 TABLET BY MOUTH 2 TIMES DAILY     Burnell Blanks, MD  nitroGLYCERIN (NITROSTAT) 0.4 MG SL tablet Place 0.4 mg under the tongue every 5 (five) minutes as needed for chest pain.    Historical Provider, MD  pregabalin (LYRICA) 75 MG capsule Take 75 mg by mouth daily.     Historical Provider, MD  Red Yeast Rice Extract (RED YEAST RICE PO) Take 1,200 mg by mouth daily.    Historical Provider, MD    ALLERGIES:  Allergies  Allergen Reactions  . Rosuvastatin     SOCIAL HISTORY:  History  Substance Use Topics  . Smoking status: Never Smoker   . Smokeless tobacco: Never Used  . Alcohol Use: No    FAMILY HISTORY: Family History  Problem Relation Age of Onset  . Cancer Sister   . Cancer Sister     EXAM: BP 152/68  Pulse 57  Temp(Src) 97.8 F (36.6 C) (Oral)  Resp 14  SpO2 97% CONSTITUTIONAL: Alert and oriented and responds appropriately to questions. Well-appearing; well-nourished HEAD: Normocephalic EYES: Conjunctivae clear, PERRL ENT: normal nose; no rhinorrhea; moist mucous membranes; pharynx without lesions noted NECK: Supple, no meningismus, no LAD  CARD: RRR; S1 and S2 appreciated; no murmurs, no clicks, no rubs, no gallops RESP: Normal chest excursion without splinting or tachypnea; breath sounds clear and equal bilaterally; no wheezes, no rhonchi, no rales,  ABD/GI: Normal bowel sounds; non-distended; soft, non-tender, no rebound, no guarding BACK:  The back appears normal and is non-tender to palpation, there  is no CVA tenderness EXT: Patient has a cord palpated in the right popliteal area, no appreciable swelling in bilateral lower extremities, 2+ DP pulses bilaterally, no erythema or warmth, no joint effusion, no induration or fluctuance, full range of motion in the right knee, sensation to light touch intact diffusely, Normal ROM in all joints; otherwise extremities are non-tender to palpation; no edema; normal capillary refill; no cyanosis    SKIN: Normal color for age and race; warm NEURO:  Moves all extremities equally PSYCH: The patient's mood and manner are appropriate. Grooming and personal hygiene are appropriate.  MEDICAL DECISION MAKING: Patient here with confirmed popliteal DVT. He is neurovascular intact distally. No complaints of chest pain or shortness of breath. He is not on anticoagulation. Have discussed options for treatment including Coumadin and Lovenox versus Xarelto and risks of each. Patient has decided he would like to be on Xarelto.   We'll check his renal function prior to discharging patient home on Xarelto.  ED PROGRESS: Creatinine is 1.2. This is chronic. We'll start her on Xarelto. Pharmacy has counseled patient as well. Will give dose before he is discharged. He has been given prescription for starter pack and coupon. Discussed supportive care instructions and strict return precautions. Patient and family verbalize understanding and are comfortable with plan.     [place], DO 02/04/14 2203

## 2014-02-04 NOTE — Discharge Instructions (Addendum)
Please stop taking Celebrex.  You may take Tylenol over-the-counter 1000 mg every 6 hours as needed for pain.   Deep Vein Thrombosis A deep vein thrombosis (DVT) is a blood clot that develops in the deep, larger veins of the leg, arm, or pelvis. These are more dangerous than clots that might form in veins near the surface of the body. A DVT can lead to serious and even life-threatening complications if the clot breaks off and travels in the bloodstream to the lungs.  A DVT can damage the valves in your leg veins so that instead of flowing upward, the blood pools in the lower leg. This is called post-thrombotic syndrome, and it can result in pain, swelling, discoloration, and sores on the leg. CAUSES Usually, several things contribute to the formation of blood clots. Contributing factors include:  The flow of blood slows down.  The inside of the vein is damaged in some way.  You have a condition that makes blood clot more easily. RISK FACTORS Some people are more likely than others to develop blood clots. Risk factors include:   Smoking.  Being overweight (obese).  Sitting or lying still for a long time. This includes long-distance travel, paralysis, or recovery from an illness or surgery. Other factors that increase risk are:   Older age, especially over 110 years of age.  Having a family history of blood clots or if you have already had a blot clot.  Having major or lengthy surgery. This is especially true for surgery on the hip, knee, or belly (abdomen). Hip surgery is particularly high risk.  Having a long, thin tube (catheter) placed inside a vein during a medical procedure.  Breaking a hip or leg.  Having cancer or cancer treatment.  Pregnancy and childbirth.  Hormone changes make the blood clot more easily during pregnancy.  The fetus puts pressure on the veins of the pelvis.  There is a risk of injury to veins during delivery or a caesarean delivery. The risk is  highest just after childbirth.  Medicines containing the male hormone estrogen. This includes birth control pills and hormone replacement therapy.  Other circulation or heart problems.  SIGNS AND SYMPTOMS When a clot forms, it can either partially or totally block the blood flow in that vein. Symptoms of a DVT can include:  Swelling of the leg or arm, especially if one side is much worse.  Warmth and redness of the leg or arm, especially if one side is much worse.  Pain in an arm or leg. If the clot is in the leg, symptoms may be more noticeable or worse when standing or walking. The symptoms of a DVT that has traveled to the lungs (pulmonary embolism, PE) usually start suddenly and include:  Shortness of breath.  Coughing.  Coughing up blood or blood-tinged mucus.  Chest pain. The chest pain is often worse with deep breaths.  Rapid heartbeat. Anyone with these symptoms should get emergency medical treatment right away. Do not wait to see if the symptoms will go away. Call your local emergency services (911 in the [place] if you have these symptoms. Do not drive yourself to the hospital. DIAGNOSIS If a DVT is suspected, your health care provider will take a full medical history and perform a physical exam. Tests that also may be required include:  Blood tests, including studies of the clotting properties of the blood.  Ultrasound to see if you have clots in your legs or lungs.  X-rays to show  the flow of blood when dye is injected into the veins (venogram).  Studies of your lungs if you have any chest symptoms. PREVENTION  Exercise the legs regularly. Take a brisk 30-minute walk every day.  Maintain a weight that is appropriate for your height.  Avoid sitting or lying in bed for long periods of time without moving your legs.  Women, particularly those over the age of 55 years, should consider the risks and benefits of taking estrogen medicines, including birth control  pills.  Do not smoke, especially if you take estrogen medicines.  Long-distance travel can increase your risk of DVT. You should exercise your legs by walking or pumping the muscles every hour.  Many of the risk factors above relate to situations that exist with hospitalization, either for illness, injury, or elective surgery. Prevention may include medical and nonmedical measures.  Your health care provider will assess you for the need for venous thromboembolism prevention when you are admitted to the hospital. If you are having surgery, your surgeon will assess you the day of or day after surgery. TREATMENT Once identified, a DVT can be treated. It can also be prevented in some circumstances. Once you have had a DVT, you may be at increased risk for a DVT in the future. The most common treatment for DVT is blood-thinning (anticoagulant) medicine, which reduces the blood's tendency to clot. Anticoagulants can stop new blood clots from forming and stop old clots from growing. They cannot dissolve existing clots. Your body does this by itself over time. Anticoagulants can be given by mouth, through an IV tube, or by injection. Your health care provider will determine the best program for you. Other medicines or treatments that may be used are:  Heparin or related medicines (low molecular weight heparin) are often the first treatment for a blood clot. They act quickly. However, they cannot be taken orally and must be given either in shot form or by IV tube.  Heparin can cause a fall in a component of blood that stops bleeding and forms blood clots (platelets). You will be monitored with blood tests to be sure this does not occur.  Warfarin is an anticoagulant that can be swallowed. It takes a few days to start working, so usually heparin or related medicines are used in combination. Once warfarin is working, heparin is usually stopped.  Factor Xa inhibitor medicines, such as rivaroxaban and apixaban,  also reduce blood clotting. These medicines are taken orally and can often be used without heparin or related medicines.  Less commonly, clot dissolving drugs (thrombolytics) are used to dissolve a DVT. They carry a high risk of bleeding, so they are used mainly in severe cases where your life or a part of your body is threatened.  Very rarely, a blood clot in the leg needs to be removed surgically.  If you are unable to take anticoagulants, your health care provider may arrange for you to have a filter placed in a main vein in your abdomen. This filter prevents clots from traveling to your lungs. HOME CARE INSTRUCTIONS  Take all medicines as directed by your health care provider.  Learn as much as you can about DVT.  Wear a medical alert bracelet or carry a medical alert card.  Ask your health care provider how soon you can go back to normal activities. It is important to stay active to prevent blood clots. If you are on anticoagulant medicine, avoid contact sports.  It is very important to exercise.  This is especially important while traveling, sitting, or standing for long periods of time. Exercise your legs by walking or by tightening and relaxing your leg muscles regularly. Take frequent walks.  You may need to wear compression stockings. These are tight elastic stockings that apply pressure to the lower legs. This pressure can help keep the blood in the legs from clotting. Taking Warfarin Warfarin is a daily medicine that is taken by mouth. Your health care provider will advise you on the length of treatment (usually 3-6 months, sometimes lifelong). If you take warfarin:  Understand how to take warfarin and foods that can affect how warfarin works in Veterinary surgeon.  Too much and too little warfarin are both dangerous. Too much warfarin increases the risk of bleeding. Too little warfarin continues to allow the risk for blood clots. Warfarin and Regular Blood Testing While taking warfarin,  you will need to have regular blood tests to measure your blood clotting time. These blood tests usually include both the prothrombin time (PT) and international normalized ratio (INR) tests. The PT and INR results allow your health care provider to adjust your dose of warfarin. It is very important that you have your PT and INR tested as often as directed by your health care provider.  Warfarin and Your Diet Avoid major changes in your diet, or notify your health care provider before changing your diet. Arrange a visit with a registered dietitian to answer your questions. Many foods, especially foods high in vitamin K, can interfere with warfarin and affect the PT and INR results. You should eat a consistent amount of foods high in vitamin K. Foods high in vitamin K include:   Spinach, kale, broccoli, cabbage, collard and turnip greens, Brussels sprouts, peas, cauliflower, seaweed, and parsley.  Beef and pork liver.  Green tea.  Soybean oil. Warfarin with Other Medicines Many medicines can interfere with warfarin and affect the PT and INR results. You must:  Tell your health care provider about any and all medicines, vitamins, and supplements you take, including aspirin and other over-the-counter anti-inflammatory medicines. Be especially cautious with aspirin and anti-inflammatory medicines. Ask your health care provider before taking these.  Do not take or discontinue any prescribed or over-the-counter medicine except on the advice of your health care provider or pharmacist. Warfarin Side Effects Warfarin can have side effects, such as easy bruising and difficulty stopping bleeding. Ask your health care provider or pharmacist about other side effects of warfarin. You will need to:  Hold pressure over cuts for longer than usual.  Notify your dentist and other health care providers that you are taking warfarin before you undergo any procedures where bleeding may occur. Warfarin with Alcohol  and Tobacco   Drinking alcohol frequently can increase the effect of warfarin, leading to excess bleeding. It is best to avoid alcoholic drinks or to consume only very small amounts while taking warfarin. Notify your health care provider if you change your alcohol intake.   Do not use any tobacco products including cigarettes, chewing tobacco, or electronic cigarettes. If you smoke, quit. Ask your health care provider for help with quitting smoking. Alternative Medicines to Warfarin: Factor Xa Inhibitor Medicines  These blood-thinning medicines are taken by mouth, usually for several weeks or longer. It is important to take the medicine every single day at the same time each day.  There are no regular blood tests required when using these medicines.  There are fewer food and drug interactions than with warfarin.  The side effects of this class of medicine are similar to those of warfarin, including excessive bruising or bleeding. Ask your health care provider or pharmacist about other potential side effects. SEEK MEDICAL CARE IF:  You notice a rapid heartbeat.  You feel weaker or more tired than usual.  You feel faint.  You notice increased bruising.  You feel your symptoms are not getting better in the time expected.  You believe you are having side effects of medicine. SEEK IMMEDIATE MEDICAL CARE IF:  You have chest pain.  You have trouble breathing.  You have new or increased swelling or pain in one leg.  You cough up blood.  You notice blood in vomit, in a bowel movement, or in urine. MAKE SURE YOU:  Understand these instructions.  Will watch your condition.  Will get help right away if you are not doing well or get worse. Document Released: 06/28/2005 Document Revised: 11/12/2013 Document Reviewed: 03/05/2013 [facility] Patient Information 2015 [place], [place]. This information is not intended to replace advice given to you by your health care provider. Make sure you  discuss any questions you have with your health care provider.  Information on my medicine - XARELTO (rivaroxaban)  This medication education was reviewed with me or my healthcare representative as part of my discharge preparation.   WHY WAS XARELTO PRESCRIBED FOR YOU? Xarelto was prescribed to treat blood clots that may have been found in the veins of your legs (deep vein thrombosis)  and to reduce the risk of them occurring again.  What do you need to know about Xarelto? The starting dose is one 15 mg tablet taken TWICE daily with food for the FIRST 21 DAYS then on 02/26/14  the dose is changed to one 20 mg tablet taken ONCE A DAY with your evening meal.  DO NOT stop taking Xarelto without talking to the health care provider who prescribed the medication.  Refill your prescription for 20 mg tablets before you run out.  After discharge, you should have regular check-up appointments with your healthcare provider that is prescribing your Xarelto.  In the future your dose may need to be changed if your kidney function changes by a significant amount.  What do you do if you miss a dose? If you are taking Xarelto TWICE DAILY and you miss a dose, take it as soon as you remember. You may take two 15 mg tablets (total 30 mg) at the same time then resume your regularly scheduled 15 mg twice daily the next day.  If you are taking Xarelto ONCE DAILY and you miss a dose, take it as soon as you remember on the same day then continue your regularly scheduled once daily regimen the next day. Do not take two doses of Xarelto at the same time.   Important Safety Information Xarelto is a blood thinner medicine that can cause bleeding. You should call your healthcare provider right away if you experience any of the following:   Bleeding from an injury or your nose that does not stop.   Unusual colored urine (red or dark brown) or unusual colored stools (red or black).   Unusual bruising for unknown  reasons.   A serious fall or if you hit your head (even if there is no bleeding).  Some medicines may interact with Xarelto and might increase your risk of bleeding while on Xarelto. To help avoid this, consult your healthcare provider or pharmacist prior to using any new prescription or non-prescription medications,  including herbals, vitamins, non-steroidal anti-inflammatory drugs (NSAIDs) and supplements.  This website has more information on Xarelto: [email or web address]

## 2014-02-04 NOTE — Progress Notes (Signed)
ANTICOAGULATION CONSULT NOTE - Initial Consult  Pharmacy Consult for Rivaroxaban Indication: DVT  Allergies  Allergen Reactions  . Rosuvastatin Other (See Comments)    Aches    Patient Measurements:   Total body Weight: 74kg  Vital Signs: Temp: 97.8 F (36.6 C) (07/27 1828) Temp src: Oral (07/27 1828) BP: 158/68 mmHg (07/27 2100) Pulse Rate: 58 (07/27 2100)  Labs:  Recent Labs  02/04/14 2057  HGB 15.0  HCT 44.0  CREATININE 1.20    The CrCl is unknown because both a height and weight (above a minimum accepted value) are required for this calculation.   Medical History: Past Medical History  Diagnosis Date  . Myocardial infarct 03/21/2008  . Coronary atherosclerosis of native coronary artery     Last cath 2009  . Pure hypercholesterolemia   . Phlebitis and thrombophlebitis of superficial vessels of lower extremities   . Gross hematuria   . Cough     Assessment: 89yom seen in ED with b/l le swelling with new B/L LE DVT.   No bleeding noted, Cr 1.2, plan to anticoagulate with rivaroxaban.   Goal of Therapy:   Monitor platelets by anticoagulation protocol: Yes   Plan:   Rivaroxaban 15mg  BID x21 days then  Rivaroxaban 20mg  daily starting 02/26/2014  Bonnita Nasuti Pharm.D. CPP, BCPS Clinical Pharmacist [phone] 02/04/2014 9:39 PM

## 2014-02-04 NOTE — Progress Notes (Signed)
VASCULAR LAB PRELIMINARY  PRELIMINARY  PRELIMINARY  PRELIMINARY  Bilateral lower extremity venous Dopplers completed.    Preliminary report:  There is acute DVT noted in the right posterior tibial vein.  All other veins appear thrombus free.  [name], RVT 02/04/2014, 7:46 PM

## 2014-02-04 NOTE — ED Notes (Signed)
Patient presents today with a chief complaint of "lump" to posterior knee that he noticed today. Patient denies pain, numbness, tingling, recent infection or fever.

## 2014-02-04 NOTE — ED Notes (Signed)
Pt to vasc.lab.by w/c. Son present with pt. Pager remains with pt. Pt alert, NAD, calm, interactive.

## 2014-02-04 NOTE — ED Notes (Signed)
Pt back from vasc. Lab. Reported "DVT", see vasc. note.

## 2014-02-19 ENCOUNTER — Ambulatory Visit (INDEPENDENT_AMBULATORY_CARE_PROVIDER_SITE_OTHER): Payer: Medicare Other | Admitting: Physician Assistant

## 2014-02-19 ENCOUNTER — Encounter: Payer: Self-pay | Admitting: Physician Assistant

## 2014-02-19 VITALS — BP 140/61 | HR 60 | Ht 71.0 in | Wt 166.0 lb

## 2014-02-19 DIAGNOSIS — R001 Bradycardia, unspecified: Secondary | ICD-10-CM | POA: Insufficient documentation

## 2014-02-19 DIAGNOSIS — I4892 Unspecified atrial flutter: Secondary | ICD-10-CM | POA: Insufficient documentation

## 2014-02-19 DIAGNOSIS — I82401 Acute embolism and thrombosis of unspecified deep veins of right lower extremity: Secondary | ICD-10-CM

## 2014-02-19 DIAGNOSIS — I10 Essential (primary) hypertension: Secondary | ICD-10-CM | POA: Insufficient documentation

## 2014-02-19 DIAGNOSIS — I498 Other specified cardiac arrhythmias: Secondary | ICD-10-CM

## 2014-02-19 DIAGNOSIS — I82409 Acute embolism and thrombosis of unspecified deep veins of unspecified lower extremity: Secondary | ICD-10-CM

## 2014-02-19 DIAGNOSIS — E7800 Pure hypercholesterolemia, unspecified: Secondary | ICD-10-CM | POA: Insufficient documentation

## 2014-02-19 DIAGNOSIS — I2510 Atherosclerotic heart disease of native coronary artery without angina pectoris: Secondary | ICD-10-CM | POA: Insufficient documentation

## 2014-02-19 NOTE — Patient Instructions (Addendum)
Your physician recommends that you continue on your current medications as directed. Please refer to the Current Medication list given to you today.   CONTINUE XARELTO FOR 3 MONTHS UNTIL AFTER DOPPLER APPT TO DETERMINE IF YOU WILL CONTINUE   IN 3 MONTHS Your physician has requested that you have a lower  extremity venous duplex. This test is an ultrasound of the veins in the legs or arms. It looks at venous blood flow that carries blood from the heart to the legs or arms. Allow one hour for a Lower Venous exam. Allow thirty minutes for an Upper Venous exam. There are no restrictions or special instructions.  Bruce ON 10/21/02015

## 2014-02-19 NOTE — Progress Notes (Signed)
[name], [name] [name], [name]  [id number] Phone: [phone] Fax:  [phone]  Date:  02/19/2014   Patient ID:  [name], [name] 02-06-1924, MRN [id number]   PCP:  Alan Dress, MD  Cardiologist: Dr. Angelena Form  History of Present Illness: [name] is a 80 y.o. male with history of CAD with anterior MI 2009 s/p LAD stenting (see below), atrial flutter in office 01/2012 but sinus bradycardia by event monitor (historically not felt to be longterm anticoag candidate), HTN, HL who presents for clinic followup. He was recently seen in the ED 7/27 for "lump" to his posterior knee and was noted to have acute DVT noted in the right posterior tibial vein. No recent prolonged immobilization, fracture, surgery, trauma, or prior history of clot. Hgb was 15.5 and Cr 1.20; he was started on Xarelto 15mg  BID x 21 days then 20mg  daily starting 02/26/14 per pharmacy recommendation. Has h/o mild thrombocytopenia with platelets ~100. He has not had any recent hematuria (states this was only present while he was on blood thinners in the past). He knows to let us know if it recurs. Otherwise he denies any CP, SOB, palpitations or any other unusual symptoms.  Recent Labs: 02/04/2014: Creatinine 1.20; Hemoglobin 15.0; Potassium 4.2   Wt Readings from Last 3 Encounters:  02/19/14 166 lb (75.297 kg)  10/26/13 165 lb (74.844 kg)  03/07/13 169 lb (76.658 kg)     Past Medical History  Diagnosis Date  . Myocardial infarct 03/21/2008  . Coronary atherosclerosis of native coronary artery     a. Anterior STEMI 2009 s/p BMSx2 to mid & prox LAD and angioplasty to distal LAD. total RCA with collaterals, moderate Cx plaquing. a. EF 55-60% in 2013.  . Pure hypercholesterolemia     a. Has not tolerated statins in the past.  . Phlebitis and thrombophlebitis of superficial vessels of lower extremities   . Gross hematuria   . Paroxysmal atrial flutter     a. Abnl EKG 01/2012 concerning  for atrial flutter, event monitor showed sinus bradycardia only and no pauses. Not felt to be a candidate for longterm anticoag due to hematuria.  . Sinus bradycardia     a. By prior event monitor.  [name] NSVT (nonsustained ventricular tachycardia)     a. Per DC summary from time of STEMI 2009.  [name] HTN (hypertension)   . Right leg DVT     a. Dx 01/2014.  [name] Thrombocytopenia     Current Outpatient Prescriptions  Medication Sig Dispense Refill  . amLODipine (NORVASC) 5 MG tablet Take 5 mg by mouth daily.      [name] aspirin 81 MG EC tablet Take 81 mg by mouth daily.        . metoprolol tartrate (LOPRESSOR) 25 MG tablet Take 12.5 mg by mouth 2 (two) times daily.      . nitroGLYCERIN (NITROSTAT) 0.4 MG SL tablet Place 0.4 mg under the tongue every 5 (five) minutes as needed for chest pain.      . pregabalin (LYRICA) 75 MG capsule Take 75 mg by mouth daily.       . Red Yeast Rice Extract (RED YEAST RICE PO) Take 1,200 mg by mouth daily.      [name] STARTER PACK 15 & 20 MG TBPK Take 15-20 mg by mouth as directed. Take as directed on package: Start with one 15mg  tablet by mouth twice a day with food. On Day 22, switch to one 20mg   tablet once a day with food.  51 each  0   No current facility-administered medications for this visit.    Allergies:   Rosuvastatin   Social History:  The patient  reports that he has never smoked. He has never used smokeless tobacco. He reports that he does not drink alcohol or use illicit drugs.   Family History:  The patient's family history includes Cancer in his sister and sister.   ROS:  Please see the history of present illness.     All other systems reviewed and negative.   PHYSICAL EXAM:  VS:  BP 140/61  Pulse 60  Ht 5\' 11"  (1.803 m)  Wt 166 lb (75.297 kg)  BMI 23.16 kg/m2 Well nourished, well developed WM, in no acute distress HEENT: normal Neck: no JVD Cardiac:  normal S1, S2; RRR; no murmur Lungs:  clear to auscultation bilaterally, no wheezing,  rhonchi or rales Abd: soft, nontender, no hepatomegaly Ext: no edema Skin: warm and dry Neuro:  moves all extremities spontaneously, no focal abnormalities noted     ASSESSMENT AND PLAN:  1. RLE DVT 2. CAD s/p prior MI, stenting as above 3. H/o remote atrial flutter and sinus bradycardia, normal sounding rhythm today 4. HTN, controlled 5. Hyperlipidemia, followed by primary care  Etiology of DVT is not clear. Historically he has not been on anticoagulation due to gross hematuria. Patient knows to monitor closely for recurrence. Discussed with Dr. Angelena Form. Will plan to re-scan in 3 months and at that time decide whether we can go ahead and stop Xarelto. Will stop aspirin while he is on Xarelto given stable cardiac status. The patient's Celebrex has already been stopped by the treating physicians in the ER. He remains on Lyrica so far with stable arthritis. We will plan to send in an rx for 2 months' worth of Xarelto (disp #30 with 1 refill) next week - we didn't want to confuse him on the dosing in the meantime while he is still on a lower dose BID, so the nurse is following up on this.  Dispo: F/u with Dr. Angelena Form as planned in October, re-scan as above.  Signed, Melina Copa, PA-C  02/19/2014 4:38 PM

## 2014-02-25 ENCOUNTER — Other Ambulatory Visit: Payer: Self-pay

## 2014-02-25 MED ORDER — RIVAROXABAN 20 MG PO TABS: 20.0000 mg | ORAL_TABLET | Freq: Every day | ORAL | Status: DC

## 2014-02-26 ENCOUNTER — Encounter: Payer: Self-pay | Admitting: Cardiovascular Disease

## 2014-02-27 ENCOUNTER — Telehealth: Payer: Self-pay | Admitting: *Deleted

## 2014-02-27 NOTE — Telephone Encounter (Signed)
Prior auth completed for Xarelto 20 mg by mouth daily. Approval received through February 28, 2015. Case number [id number]

## 2014-04-28 ENCOUNTER — Other Ambulatory Visit: Payer: Self-pay | Admitting: Cardiovascular Disease

## 2014-05-01 ENCOUNTER — Ambulatory Visit: Payer: Medicare Other | Admitting: Cardiovascular Disease

## 2014-05-19 ENCOUNTER — Other Ambulatory Visit: Payer: Self-pay | Admitting: Cardiovascular Disease

## 2014-05-31 ENCOUNTER — Encounter (HOSPITAL_COMMUNITY): Payer: Medicare Other

## 2014-06-03 ENCOUNTER — Encounter: Payer: Self-pay | Admitting: Cardiovascular Disease

## 2014-06-03 ENCOUNTER — Ambulatory Visit (INDEPENDENT_AMBULATORY_CARE_PROVIDER_SITE_OTHER): Payer: Medicare Other | Admitting: Cardiovascular Disease

## 2014-06-03 ENCOUNTER — Ambulatory Visit (HOSPITAL_COMMUNITY): Payer: Medicare Other | Attending: Physician Assistant | Admitting: *Deleted

## 2014-06-03 VITALS — BP 142/62 | HR 61 | Ht 71.0 in | Wt 160.2 lb

## 2014-06-03 DIAGNOSIS — I10 Essential (primary) hypertension: Secondary | ICD-10-CM | POA: Insufficient documentation

## 2014-06-03 DIAGNOSIS — I2510 Atherosclerotic heart disease of native coronary artery without angina pectoris: Secondary | ICD-10-CM

## 2014-06-03 DIAGNOSIS — I82409 Acute embolism and thrombosis of unspecified deep veins of unspecified lower extremity: Secondary | ICD-10-CM | POA: Insufficient documentation

## 2014-06-03 DIAGNOSIS — E785 Hyperlipidemia, unspecified: Secondary | ICD-10-CM

## 2014-06-03 DIAGNOSIS — I82401 Acute embolism and thrombosis of unspecified deep veins of right lower extremity: Secondary | ICD-10-CM

## 2014-06-03 MED ORDER — ASPIRIN EC 81 MG PO TBEC: 81.0000 mg | DELAYED_RELEASE_TABLET | Freq: Every day | ORAL | Status: DC

## 2014-06-03 NOTE — Progress Notes (Signed)
History of Present Illness: 80 yo male with history of CAD, HLD, atrial flutter who is here today for cardiac follow up. He has been followed in the past by Dr. Lia Foyer. I met him for the first time in 03/07/14. His CAD has been stable. Last cath 2009. Abnormal EKG in July 2013 concerning for atrial flutter. He was seen in the EP clinic by Dr. Rayann Heman and event monitor showed sinus brady only with no pauses. Not felt to be a candidate for long term anti-coagulation secondary to hematuria. He has not tolerated statins in the past. DVT in July 2015 and started on Xarelto with plan to stop in November 2015. Echo today  He is here today for follow up. He tells me that he has been feeling well. No chest pain or SOB. No palpitations. Tolerating all meds. He is anxious to stop his Xarelto. Dopplers today with no evidence of DVT.   Primary Care Physician: Nelda Bucks   Last Lipid Profile: Followed in primary care.   Past Medical History  Diagnosis Date  . Myocardial infarct 03/21/2008  . Coronary atherosclerosis of native coronary artery     a. Anterior STEMI 2009 s/p BMSx2 to mid & prox LAD and angioplasty to distal LAD. total RCA with collaterals, moderate Cx plaquing. a. EF 55-60% in 2013.  . Pure hypercholesterolemia     a. Has not tolerated statins in the past.  . Phlebitis and thrombophlebitis of superficial vessels of lower extremities   . Gross hematuria   . Paroxysmal atrial flutter     a. Abnl EKG 01/2012 concerning for atrial flutter, event monitor showed sinus bradycardia only and no pauses. Not felt to be a candidate for longterm anticoag due to hematuria.  . Sinus bradycardia     a. By prior event monitor.  [name] NSVT (nonsustained ventricular tachycardia)     a. Per DC summary from time of STEMI 2009.  [name] HTN (hypertension)   . Right leg DVT     a. Dx 01/2014.  [name] Thrombocytopenia     Past Surgical History  Procedure Laterality Date  . Replacement total knee bilateral    .  Carpal tunnel release    . Trigger finger surgery      Current Outpatient Prescriptions  Medication Sig Dispense Refill  . amLODipine (NORVASC) 5 MG tablet Take 5 mg by mouth daily.    . metoprolol tartrate (LOPRESSOR) 25 MG tablet Take 12.5 mg by mouth 2 (two) times daily.    . nitroGLYCERIN (NITROSTAT) 0.4 MG SL tablet Place 0.4 mg under the tongue every 5 (five) minutes as needed for chest pain.    . pregabalin (LYRICA) 75 MG capsule Take 75 mg by mouth daily.     . Red Yeast Rice Extract (RED YEAST RICE PO) Take 1,200 mg by mouth daily.    . rivaroxaban (XARELTO) 20 MG TABS tablet Take 1 tablet (20 mg total) by mouth daily with supper. 30 tablet 6   No current facility-administered medications for this visit.    Allergies  Allergen Reactions  . Rosuvastatin Other (See Comments)    Aches    History   Social History  . Marital Status: Married    Spouse Name: N/A    Number of Children: N/A  . Years of Education: N/A   Occupational History  . Not on file.   Social History Main Topics  . Smoking status: Never Smoker   . Smokeless tobacco: Never Used  . Alcohol  Use: No  . Drug Use: No  . Sexual Activity: Not on file   Other Topics Concern  . Not on file   Social History Narrative   Lives in [place] w/ wife. 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 Denies tobacco, alcohol, or drug use ever in lifetime.    Family History  Problem Relation Age of Onset  . Cancer Sister   . Cancer Sister     Review of Systems:  As stated in the HPI and otherwise negative.   BP 142/62 mmHg  Pulse 61  Ht 5' 11"  (1.803 m)  Wt 160 lb 3.2 oz (72.666 kg)  BMI 22.35 kg/m2  SpO2 96%  Physical Examination: General: Well developed, well nourished, NAD HEENT: OP clear, mucus membranes moist SKIN: warm, dry. No rashes. Neuro: No focal deficits Musculoskeletal: Muscle strength 5/5 all ext Psychiatric: Mood and affect normal Neck: No JVD, no carotid bruits, no  thyromegaly, no lymphadenopathy. Lungs:Clear bilaterally, no wheezes, rhonci, crackles Cardiovascular: Regular rate and rhythm. No murmurs, gallops or rubs. Abdomen:Soft. Bowel sounds present. Non-tender.  Extremities: No lower extremity edema. Pulses are 2 + in the bilateral DP/PT.  Cardiac cath 03/21/08:  1. Ventriculography in the RAO projection reveals hypo-to-akinesis of  the anteroapical segment. The ejection fraction estimate will be  45%.  2. The left main is free of critical disease.  3. The LAD has a 60% narrowing at the septal. There is some mild  plaquing distal to this and then just after another septal, there  is a 90% narrowing, then total occlusion after a small diagonal.  The extent of disease extends into just beyond the next diagonal.  The proximal lesion was covered with a 2.5-mm stent with reduction  from 60%-0%. The mid LAD, which was the site of total occlusion  was reduced from 90% and 100% down to 0%. The distal lesion was  80%-90% in the distal vessel and reduced from 80%-90% to less than  20% residual narrowing with smooth angiographic result and good  runoff into the distal vessel.  4. The circumflex provides a large marginal branch with diffuse  segmental 60% narrowing followed by a large marginal branch. There  was a tiny subbranch with 90% narrowing and an inferior subbranch  with perhaps 60% narrowing.  5. The right coronary artery is segmentally plaqued in the midvessel  and then totally occluded after an RV branch. This RV branch then  collateralizes the distal vessel. The distal vessel was supplied  by the LAD septals.  EKG: sinus brady, rate 51 bpm. Incomplete RBBB. LAFB. Poor R wave progression through precordial leads.   Assessment and Plan:   1. CAD: Stable. No changes. Will restart ASA 81 mg daily since we are stopping Xarelto. Continue beta blocker.    2. Atrial arrythmia: No palpitations. Sinus today. Continue beta blocker.   3.  Hyperlipidemia: Followed in primary care. Continue statin.   4. DVT: Will stop Xarelto

## 2014-06-03 NOTE — Patient Instructions (Signed)
Your physician wants you to follow-up in:  6 months. You will receive a reminder letter in the mail two months in advance. If you don't receive a letter, please call our office to schedule the follow-up appointment.  Your physician has recommended you make the following change in your medication:   Stop Xarelto. Start enteric coated aspirin 81 mg by mouth daily. OK to resume Celebrex.

## 2014-06-03 NOTE — Progress Notes (Signed)
Right lower extremity venous duplex completed

## 2014-06-19 ENCOUNTER — Other Ambulatory Visit: Payer: Self-pay | Admitting: Cardiovascular Disease

## 2014-06-25 ENCOUNTER — Other Ambulatory Visit: Payer: Self-pay | Admitting: Cardiovascular Disease

## 2014-07-29 ENCOUNTER — Encounter: Payer: Self-pay | Admitting: Cardiovascular Disease

## 2014-09-27 ENCOUNTER — Inpatient Hospital Stay (HOSPITAL_COMMUNITY)
Admission: EM | Admit: 2014-09-27 | Discharge: 2014-09-28 | DRG: 074 | Disposition: A | Discharge status: Home Health | Payer: Medicare Other | Attending: Oncology | Admitting: Oncology

## 2014-09-27 ENCOUNTER — Inpatient Hospital Stay (HOSPITAL_COMMUNITY): Payer: Medicare Other

## 2014-09-27 ENCOUNTER — Emergency Department (HOSPITAL_COMMUNITY): Payer: Medicare Other

## 2014-09-27 ENCOUNTER — Encounter (HOSPITAL_COMMUNITY): Payer: Self-pay | Admitting: *Deleted

## 2014-09-27 DIAGNOSIS — G629 Polyneuropathy, unspecified: Secondary | ICD-10-CM | POA: Diagnosis present

## 2014-09-27 DIAGNOSIS — E780 Pure hypercholesterolemia: Secondary | ICD-10-CM | POA: Diagnosis present

## 2014-09-27 DIAGNOSIS — M5090 Cervical disc disorder, unspecified, unspecified cervical region: Secondary | ICD-10-CM | POA: Diagnosis present

## 2014-09-27 DIAGNOSIS — I252 Old myocardial infarction: Secondary | ICD-10-CM

## 2014-09-27 DIAGNOSIS — Z96659 Presence of unspecified artificial knee joint: Secondary | ICD-10-CM | POA: Diagnosis present

## 2014-09-27 DIAGNOSIS — R338 Other retention of urine: Secondary | ICD-10-CM | POA: Diagnosis present

## 2014-09-27 DIAGNOSIS — R262 Difficulty in walking, not elsewhere classified: Secondary | ICD-10-CM | POA: Diagnosis present

## 2014-09-27 DIAGNOSIS — Z8672 Personal history of thrombophlebitis: Secondary | ICD-10-CM | POA: Diagnosis not present

## 2014-09-27 DIAGNOSIS — G9389 Other specified disorders of brain: Secondary | ICD-10-CM | POA: Diagnosis present

## 2014-09-27 DIAGNOSIS — Z7982 Long term (current) use of aspirin: Secondary | ICD-10-CM | POA: Diagnosis not present

## 2014-09-27 DIAGNOSIS — Z86718 Personal history of other venous thrombosis and embolism: Secondary | ICD-10-CM | POA: Diagnosis not present

## 2014-09-27 DIAGNOSIS — E860 Dehydration: Secondary | ICD-10-CM | POA: Diagnosis present

## 2014-09-27 DIAGNOSIS — R2681 Unsteadiness on feet: Secondary | ICD-10-CM

## 2014-09-27 DIAGNOSIS — R270 Ataxia, unspecified: Secondary | ICD-10-CM | POA: Diagnosis not present

## 2014-09-27 DIAGNOSIS — R42 Dizziness and giddiness: Secondary | ICD-10-CM

## 2014-09-27 DIAGNOSIS — M1990 Unspecified osteoarthritis, unspecified site: Secondary | ICD-10-CM | POA: Diagnosis present

## 2014-09-27 DIAGNOSIS — Z79899 Other long term (current) drug therapy: Secondary | ICD-10-CM | POA: Diagnosis not present

## 2014-09-27 DIAGNOSIS — R312 Other microscopic hematuria: Secondary | ICD-10-CM | POA: Diagnosis present

## 2014-09-27 DIAGNOSIS — Z955 Presence of coronary angioplasty implant and graft: Secondary | ICD-10-CM | POA: Diagnosis not present

## 2014-09-27 DIAGNOSIS — R3129 Other microscopic hematuria: Secondary | ICD-10-CM | POA: Diagnosis present

## 2014-09-27 DIAGNOSIS — I10 Essential (primary) hypertension: Secondary | ICD-10-CM | POA: Diagnosis present

## 2014-09-27 DIAGNOSIS — N390 Urinary tract infection, site not specified: Secondary | ICD-10-CM | POA: Diagnosis present

## 2014-09-27 DIAGNOSIS — B961 Klebsiella pneumoniae [K. pneumoniae] as the cause of diseases classified elsewhere: Secondary | ICD-10-CM | POA: Diagnosis present

## 2014-09-27 DIAGNOSIS — I2510 Atherosclerotic heart disease of native coronary artery without angina pectoris: Secondary | ICD-10-CM | POA: Diagnosis present

## 2014-09-27 DIAGNOSIS — R269 Unspecified abnormalities of gait and mobility: Secondary | ICD-10-CM

## 2014-09-27 DIAGNOSIS — H814 Vertigo of central origin: Secondary | ICD-10-CM

## 2014-09-27 LAB — COMPREHENSIVE METABOLIC PANEL
ALT: 20 U/L (ref 0–53)
ANION GAP: 6 (ref 5–15)
AST: 28 U/L (ref 0–37)
Albumin: 4 g/dL (ref 3.5–5.2)
Alkaline Phosphatase: 59 U/L (ref 39–117)
BUN: 27 mg/dL — ABNORMAL HIGH (ref 6–23)
CO2: 26 mmol/L (ref 19–32)
CREATININE: 1.19 mg/dL (ref 0.50–1.35)
Calcium: 9.3 mg/dL (ref 8.4–10.5)
Chloride: 108 mmol/L (ref 96–112)
GFR calc Af Amer: 61 mL/min — ABNORMAL LOW (ref >90)
GFR calc non Af Amer: 53 mL/min — ABNORMAL LOW (ref >90)
GLUCOSE: 108 mg/dL — AB (ref 70–99)
Potassium: 4.2 mmol/L (ref 3.5–5.1)
Sodium: 140 mmol/L (ref 135–145)
Total Bilirubin: 0.9 mg/dL (ref 0.3–1.2)
Total Protein: 6.3 g/dL (ref 6.0–8.3)

## 2014-09-27 LAB — URINALYSIS, ROUTINE W REFLEX MICROSCOPIC
Bilirubin Urine: NEGATIVE
Glucose, UA: NEGATIVE mg/dL
KETONES UR: NEGATIVE mg/dL
Leukocytes, UA: NEGATIVE
Nitrite: NEGATIVE
PH: 7 (ref 5.0–8.0)
Protein, ur: NEGATIVE mg/dL
SPECIFIC GRAVITY, URINE: 1.01 (ref 1.005–1.030)
Urobilinogen, UA: 0.2 mg/dL (ref 0.0–1.0)

## 2014-09-27 LAB — CBC WITH DIFFERENTIAL/PLATELET
BASOS ABS: 0 10*3/uL (ref 0.0–0.1)
Basophils Relative: 0 % (ref 0–1)
Eosinophils Absolute: 0 10*3/uL (ref 0.0–0.7)
Eosinophils Relative: 1 % (ref 0–5)
HCT: 41.9 % (ref 39.0–52.0)
Hemoglobin: 14.3 g/dL (ref 13.0–17.0)
LYMPHS ABS: 1 10*3/uL (ref 0.7–4.0)
LYMPHS PCT: 12 % (ref 12–46)
MCH: 28.8 pg (ref 26.0–34.0)
MCHC: 34.1 g/dL (ref 30.0–36.0)
MCV: 84.3 fL (ref 78.0–100.0)
Monocytes Absolute: 0.4 10*3/uL (ref 0.1–1.0)
Monocytes Relative: 5 % (ref 3–12)
NEUTROS PCT: 82 % — AB (ref 43–77)
Neutro Abs: 6.3 10*3/uL (ref 1.7–7.7)
PLATELETS: 100 10*3/uL — AB (ref 150–400)
RBC: 4.97 MIL/uL (ref 4.22–5.81)
RDW: 13.7 % (ref 11.5–15.5)
WBC: 7.7 10*3/uL (ref 4.0–10.5)

## 2014-09-27 LAB — URINE MICROSCOPIC-ADD ON

## 2014-09-27 LAB — TROPONIN I: Troponin I: <0.03 ng/mL (ref <0.031)

## 2014-09-27 MED ORDER — DEXTROSE 5 % IV SOLN
1.0000 g | Freq: Once | INTRAVENOUS | Status: AC
  Administered 2014-09-27: 1 g via INTRAVENOUS
  Filled 2014-09-27: qty 10

## 2014-09-27 MED ORDER — ASPIRIN 81 MG PO CHEW: 324.0000 mg | CHEWABLE_TABLET | Freq: Once | ORAL | Status: DC

## 2014-09-27 MED ORDER — ENOXAPARIN SODIUM 40 MG/0.4ML ~~LOC~~ SOLN
40.0000 mg | SUBCUTANEOUS | Status: DC
  Administered 2014-09-27: 40 mg via SUBCUTANEOUS
  Filled 2014-09-27: qty 0.4

## 2014-09-27 MED ORDER — AMLODIPINE BESYLATE 5 MG PO TABS
5.0000 mg | ORAL_TABLET | Freq: Every day | ORAL | Status: DC
  Administered 2014-09-27 – 2014-09-28 (×2): 5 mg via ORAL
  Filled 2014-09-27: qty 1
  Filled 2014-09-27: qty 2

## 2014-09-27 MED ORDER — ASPIRIN EC 81 MG PO TBEC
81.0000 mg | DELAYED_RELEASE_TABLET | Freq: Every day | ORAL | Status: DC
Start: 2014-09-28 — End: 2014-09-28
  Administered 2014-09-28: 81 mg via ORAL
  Filled 2014-09-27: qty 1

## 2014-09-27 MED ORDER — METOPROLOL TARTRATE 12.5 MG HALF TABLET
12.5000 mg | ORAL_TABLET | Freq: Two times a day (BID) | ORAL | Status: DC
  Administered 2014-09-27 – 2014-09-28 (×3): 12.5 mg via ORAL
  Filled 2014-09-27 (×3): qty 1

## 2014-09-27 NOTE — ED Notes (Signed)
Patient presents by [facility].  Original call was for chest pain but EMS found out it was not chest pain but leg tingling and weakness.  Patient stated he has not had any chest pain at all.  While laying in the bed he stated his legs fell fine.  No more tingling.

## 2014-09-27 NOTE — ED Notes (Signed)
consulting MD at bedside.

## 2014-09-27 NOTE — Progress Notes (Signed)
PT Cancellation Note  Patient Details Name: [name] MRN: [id number] DOB: [birth year]   Cancelled Treatment:    Reason Eval/Treat Not Completed: Patient not medically ready Patient currently on bed rest orders. Please update activity orders for PT evaluation when patient is medically ready.  Ellouise Newer 09/27/2014, 3:44 PM Camille Bal [place], [place]

## 2014-09-27 NOTE — ED Notes (Signed)
No drift when legs or arms up in the air.  Follows commands.  Speech clear, pupils equal and reactive, no weakness greater on one side then the other side.

## 2014-09-27 NOTE — H&P (Signed)
Date: 09/27/2014               Patient Name:  [name] MRN: [id number]  DOB: 1923-12-26 Age / Sex: 81 y.o., male   PCP: Nicoletta Dress, MD              Medical Service: Internal Medicine Teaching Service              Attending Physician: Dr. Elnora Morrison, MD    First Contact: Dr. Hulen Luster Pager: [phone]  Second Contact: Dr. Heber Bussey Pager: [phone]            After Hours (After 5p/  First Contact Pager: [phone]  weekends / holidays): Second Contact Pager: [phone]   Chief Complaint: Gait instability  History of Present Illness: [name] is an 81 year old gentleman with past medical history of CAD, degenerative disc disease, peripheral neuropathy, hypertension, urinary retention and a history of atrial flutter who presents to the ED with gait instability, which he started last night. Patient states that he woke up to go to the bathroom when he felt that he was unsteady on his feet. He held himself onto furniture and he was able to make his way to the bathroom and returned to his bed. He did not fall. Due to acute onset of the symptoms, patient decided to present to the ED. At baseline, patient has no gait instability and usually does not need to take a moment when he gets up from a seated position before he starts walking. He does not use a walker even though sometimes he uses a cane when his walking on uneven surfaces. He has no history of dizziness. He does not report any history of weakness in any of his extremities, no dysarthria, visual, auditory changes, no numbness, or any other stroke symptoms. He did not pass out. Denies confusion. No photophobia or neck pain. He has a very remote history of vertigo many years ago, which resolved. In the ED, his neurologic exam was unremarkable. However, he was unable to walk or stand without support. Brain MRI was negative for stroke. Patient was admitted for evaluation of his gait instability.  Review of  Systems: Constitutional: No fever, chills, diaphoresis, appetite change and fatigue.  HEENT: No URI symptoms. No neck pain or photophobia Respiratory: No SOB, DOE, cough, chest tightness, and wheezing. No hemoptysis.  Cardiovascular: No chest pain, palpitations and leg swelling. No PND or Orthopnea. Gastrointestinal: No N/V or diarrhea. No abdominal pain. No hematochezia or melena.  Genitourinary: No dysuria, hematuria, flank pain and difficulty urinating. He reports increased urinary frequency Musculoskeletal: Chronic back pain due to DDD. He reports that sometimes feels 'heaviness' in his legs which he attributes to his degenerative disc disease. No myalgias, joint swelling, arthralgias and gait problem.  Skin: No rash and wound. No easy bruising. Neurological: No dizziness, seizures, syncope, and headaches.  Psychiatric/Behavioral: No SI, mood changes, confusion, nervousness, sleep disturbance and agitation  Meds  Current Outpatient Prescriptions  Medication Sig Dispense Refill  . amLODipine (NORVASC) 5 MG tablet TAKE 1 TABLET (5 MG TOTAL) BY MOUTH DAILY. 30 tablet 5  . aspirin EC 81 MG tablet Take 1 tablet (81 mg total) by mouth daily. 90 tablet 3  . celecoxib (CELEBREX) 200 MG capsule Take 200 mg by mouth daily.    . metoprolol tartrate (LOPRESSOR) 25 MG tablet TAKE 1/2 TABLET BY MOUTH 2 TIMES DAILY 30 tablet 5  . nitroGLYCERIN (NITROSTAT) 0.4 MG SL tablet Place 0.4 mg under the  tongue every 5 (five) minutes as needed for chest pain.    . pregabalin (LYRICA) 75 MG capsule Take 75 mg by mouth daily.     . Red Yeast Rice Extract (RED YEAST RICE PO) Take 1,200 mg by mouth daily.      Allergies: Allergies as of 09/27/2014 - Review Complete 09/27/2014  Allergen Reaction Noted  . Rosuvastatin Other (See Comments) 03/19/2009   Past Medical History  Diagnosis Date  . Myocardial infarct 03/21/2008  . Coronary atherosclerosis of native coronary artery     a. Anterior STEMI 2009 s/p BMSx2  to mid & prox LAD and angioplasty to distal LAD. total RCA with collaterals, moderate Cx plaquing. a. EF 55-60% in 2013.  . Pure hypercholesterolemia     a. Has not tolerated statins in the past.  . Phlebitis and thrombophlebitis of superficial vessels of lower extremities   . Gross hematuria   . Paroxysmal atrial flutter     a. Abnl EKG 01/2012 concerning for atrial flutter, event monitor showed sinus bradycardia only and no pauses. Not felt to be a candidate for longterm anticoag due to hematuria.  . Sinus bradycardia     a. By prior event monitor.  [name] NSVT (nonsustained ventricular tachycardia)     a. Per DC summary from time of STEMI 2009.  [name] HTN (hypertension)   . Right leg DVT     a. Dx 01/2014.  [name] Thrombocytopenia    Past Surgical History  Procedure Laterality Date  . Replacement total knee bilateral    . Carpal tunnel release    . Trigger finger surgery     Family History  Problem Relation Age of Onset  . Cancer Sister   . Cancer Sister    History   Social History  . Marital Status: Married    Spouse Name: N/A  . Number of Children: N/A  . Years of Education: N/A   Occupational History  . Not on file.   Social History Main Topics  . Smoking status: Never Smoker   . Smokeless tobacco: Never Used  . Alcohol Use: No  . Drug Use: No  . Sexual Activity: Not on file   Other Topics Concern  . Not on file   Social History Narrative   Lives in [place] w/ wife. 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 Denies tobacco, alcohol, or drug use ever in lifetime.      Physical Exam: Blood pressure 139/66, pulse 53, temperature 97.7 F (36.5 C), temperature source Oral, resp. rate 14, height 5\' 11"  (1.803 m), weight 164 lb (74.39 kg), SpO2 95 %.  General: well developed, well nourished; no acute distress, cooperative with exam HEENT: Neck is supple. Head is Atraumatic. Normal EOM. Pupils equal, round and reactive; anicteric. Nose/throat: oropharynx  clear, moist mucous membranes, pink gums  Lungs/Chest wall: CTA bil, normal work of breathing  Heart: normal RRR; no murmurs. No JVD Pulses: radial and dorsalis pedis pulses are 2+ and symmetric  Abdomen: Normal fullness, no rebound, guarding, or rigidity; nl BS; no palpable masses.  Skin: warm, dry, intact, normal turgor, no rashes  Extremities: no peripheral edema, clubbing, or cyanosis  Neurologic Exam:   Mental Status: Alert, oriented, thought content appropriate.  Speech fluent without evidence of aphasia. Able to follow 3 step commands without difficulty.  Cranial Nerves:   II: Visual fields grossly intact.  III/IV/VI: Extraocular movements intact.  Pupils reactive bilaterally.  V/VII: Smile symmetric. Facial light touch sensation normal bilaterally.  VIII: Grossly intact.  XI: Bilateral shoulder shrug normal.  XII: Midline tongue extension normal.  Motor:  5/5 bilaterally with normal tone and bulk  Sensory:  Pinprick and light touch intact throughout, bilaterally  DTRs: 2+ and symmetric throughout  Plantars:  Downgoing bilaterally  Cerebellar: Normal finger-to-nose, normal rapid alternating movements and normal heel-to-shin test. Unable to walk with wide based gait. Romberg sign is positive. Tends to fall backwards    Psych: Normal mood and affect. Normal speech and behavior. No agitation   Lab results: Basic Metabolic Panel:  Recent Labs  09/27/14 0522  NA 140  K 4.2  CL 108  CO2 26  GLUCOSE 108*  BUN 27*  CREATININE 1.19  CALCIUM 9.3   Liver Function Tests:  Recent Labs  09/27/14 0522  AST 28  ALT 20  ALKPHOS 59  BILITOT 0.9  PROT 6.3  ALBUMIN 4.0   CBC:  Recent Labs  09/27/14 0522  WBC 7.7  NEUTROABS 6.3  HGB 14.3  HCT 41.9  MCV 84.3  PLT 100*   Cardiac Enzymes:  Recent Labs  09/27/14 0522  TROPONINI <0.03   Urinalysis:  Recent Labs  09/27/14 0530  COLORURINE YELLOW  LABSPEC 1.010  PHURINE 7.0  GLUCOSEU NEGATIVE  HGBUR  MODERATE*  BILIRUBINUR NEGATIVE  KETONESUR NEGATIVE  PROTEINUR NEGATIVE  UROBILINOGEN 0.2  NITRITE NEGATIVE  LEUKOCYTESUR NEGATIVE    Imaging results:  [name] Contrast  09/27/2014   CLINICAL DATA:  Acute onset gait imbalance 3 a.m., lower extremity weakness, subjective symptoms of heart attack. History of dizziness, hypertension, and heart attack.  EXAM: MRI HEAD WITHOUT CONTRAST  TECHNIQUE: Multiplanar, multiecho pulse sequences of the brain and surrounding structures were obtained without intravenous contrast.  COMPARISON:  CT of the head Nov 18, 2009  FINDINGS: No reduced diffusion to suggest acute ischemia. No susceptibility artifact to suggest acute hemorrhage.  The ventricles and sulci are normal for patient's age, however there is mild ex vacuo dilatation LEFT occipital horn associated with LEFT occipital encephalomalacia, trace amount of RIGHT occipital encephalomalacia noted. RIGHT inferior frontal lobe cystic encephalomalacia. LEFT temporal encephalomalacia with faint susceptibility artifact. Mild to moderate white matter changes can be seen with chronic small vessel ischemic disease. No midline shift, mass effect or mass lesions.  No abnormal extra-axial fluid collections. Major intracranial vascular flow voids observed at the skull base. Mild dolichoectasia of the intracranial vessels can be seen with chronic hypertension. Status post bilateral ocular lens implants. Bilateral maxillary small mucosal retention cyst without paranasal sinus air-fluid levels. The mastoid air cells are well aerated. No abnormal sellar expansion. No cerebellar tonsillar ectopia. Slightly fragmented appearance of the longus coli insertion can be associated with tendinopathy.  IMPRESSION: No acute intracranial process, specifically no acute ischemia.  LEFT greater than RIGHT occipital lobe encephalomalacia may be posttraumatic or reflect remote posterior cerebral artery territory infarcts. RIGHT inferior frontal  lobe encephalomalacia is likely posttraumatic. LEFT temporal hemorrhagic encephalomalacia may reflect remote hemorrhagic infarct or traumatic hematoma.  Involutional changes. Mild to moderate white matter changes suggest chronic small vessel ischemic disease.   Electronically Signed   By: Elon Alas   On: 09/27/2014 06:53    Other results: EKG: normal EKG, normal sinus rhythm, Q waves in anteroseptal leads, incomplete right bundle branch block and left anterior fascicular block. Nothing new.  Assessment & Plan by Problem: Principal Problem:   Unsteady gait Active Problems:   Urinary retention   Coronary atherosclerosis of native coronary artery  HTN (hypertension)   Gait instability: Etiology is unclear this point. His neurologic exam is otherwise unremarkable, besides gait instability with ataxia when he tries to stand up and tenderness to fall backwards. Brain MRI does not reveal acute stroke even though he has bilateral encephalomalacia and remote infarcts. Lyrica can cause dizziness and ataxia. Also possibly due peripheral neuropathy. He has no symptoms of syncope. No cardiopulmonary symptoms. Orthostatics were negative. I doubt whether his gait instability is related to urinary findings? UTI Plan -Admit telemetry -Consulted neurology- rec cervical MRI, b12 level, tsh, SPEP and rehab consult. - would benefit from EMG/NCS as outpatient  -Fall precautions. -Hold Lyrica for now -PT and OT evaluation.  CAD: History of stent placement. Troponin is negative. EKG without acute changes. Plan -cycle troponins overnight -Continue with metoprolol and aspirin  Abnormal urinalysis: UA revealed bacteriuria and moderate hemoglobinuria. Otherwise without nitrites. Patient's on this possible symptoms increased frequence. He received ceftriaxone in the ED.  Plan -Continue with ceftriaxone for now  Code Status: Full code   F/E/N: [facility]  VTE Ppx: SubQ Lovenox  Family  Communication: Discussed with the patient and his son at bedside. All questions answered.  Dispo: Disposition is deferred at this time, awaiting improvement of current medical problems. Anticipated discharge in approximately 1-2 day(s).   The patient does have a current PCP Nicoletta Dress, MD), therefore is not require OPC follow-up after discharge.   The patient does not have transportation limitations that hinder transportation to clinic appointments.   Signed:  Jessee Avers, MD PGY-3 Internal Medicine Teaching Service Pager [phone] 09/27/2014, 10:52 AM

## 2014-09-27 NOTE — ED Notes (Signed)
Belongings taken up with patient.

## 2014-09-27 NOTE — Consult Note (Signed)
NEURO HOSPITALIST CONSULT NOTE    Reason for Consult: unstable gait  HPI:                                                                                                                                          HELDER [name] is an 81 y.o. male with history of cervical fractures in the past, PAF, sinus brady cardia who presents to the ED today after he woke up this AM and noted he felt off balance.  He sat at the edge of his bed and noted he felt as though he was drifting to both the left and right. HE stood up and walked to the bathroom.  He had to use his hands and hold onto furniture as he would drift to both sides.  HE sat down, urinated and upon standing he again felt as though he would fall to either side.  HE did not fall. He denies vertigo, fullness in his ears or sinuses, ringing or rushing sounds.  HE does state that he will feel unsteady on his feet if the lights are turned off and he tries to walk. He has had decreased sensation in bilateral feet for many years.  He notes he cannot walk in the woods due to his shuffling [name] gait.   Past Medical History  Diagnosis Date  . Myocardial infarct 03/21/2008  . Coronary atherosclerosis of native coronary artery     a. Anterior STEMI 2009 s/p BMSx2 to mid & prox LAD and angioplasty to distal LAD. total RCA with collaterals, moderate Cx plaquing. a. EF 55-60% in 2013.  . Pure hypercholesterolemia     a. Has not tolerated statins in the past.  . Phlebitis and thrombophlebitis of superficial vessels of lower extremities   . Gross hematuria   . Paroxysmal atrial flutter     a. Abnl EKG 01/2012 concerning for atrial flutter, event monitor showed sinus bradycardia only and no pauses. Not felt to be a candidate for longterm anticoag due to hematuria.  . Sinus bradycardia     a. By prior event monitor.  [name] NSVT (nonsustained ventricular tachycardia)     a. Per DC summary from time of STEMI 2009.  [name] HTN (hypertension)   .  Right leg DVT     a. Dx 01/2014.  [name] Thrombocytopenia     Past Surgical History  Procedure Laterality Date  . Replacement total knee bilateral    . Carpal tunnel release    . Trigger finger surgery      Family History  Problem Relation Age of Onset  . Cancer Sister   . Cancer Sister      Social History:  reports that he has never smoked. He has never used smokeless tobacco. He reports that he does  not drink alcohol or use illicit drugs.  Allergies  Allergen Reactions  . Rosuvastatin Other (See Comments)    Aches    MEDICATIONS:                                                                                                                     Current Facility-Administered Medications  Medication Dose Route Frequency Provider Last Rate Last Dose  . aspirin chewable tablet 324 mg  324 mg Oral Once Delora Fuel, MD   0000000 mg at 09/27/14 [id number]   Current Outpatient Prescriptions  Medication Sig Dispense Refill  . amLODipine (NORVASC) 5 MG tablet TAKE 1 TABLET (5 MG TOTAL) BY MOUTH DAILY. 30 tablet 5  . aspirin EC 81 MG tablet Take 1 tablet (81 mg total) by mouth daily. 90 tablet 3  . celecoxib (CELEBREX) 200 MG capsule Take 200 mg by mouth daily.    . metoprolol tartrate (LOPRESSOR) 25 MG tablet TAKE 1/2 TABLET BY MOUTH 2 TIMES DAILY 30 tablet 5  . nitroGLYCERIN (NITROSTAT) 0.4 MG SL tablet Place 0.4 mg under the tongue every 5 (five) minutes as needed for chest pain.    . pregabalin (LYRICA) 75 MG capsule Take 75 mg by mouth daily.     . Red Yeast Rice Extract (RED YEAST RICE PO) Take 1,200 mg by mouth daily.        ROS:                                                                                                                                       History obtained from the patient  General ROS: negative for - chills, fatigue, fever, night sweats, weight gain or weight loss Psychological ROS: negative for - behavioral disorder, hallucinations, memory difficulties, mood  swings or suicidal ideation Ophthalmic ROS: negative for - blurry vision, double vision, eye pain or loss of vision ENT ROS: negative for - epistaxis, nasal discharge, oral lesions, sore throat, tinnitus or vertigo Allergy and Immunology ROS: negative for - hives or itchy/watery eyes Hematological and Lymphatic ROS: negative for - bleeding problems, bruising or swollen lymph nodes Endocrine ROS: negative for - galactorrhea, hair pattern changes, polydipsia/polyuria or temperature intolerance Respiratory ROS: negative for - cough, hemoptysis, shortness of breath or wheezing Cardiovascular ROS: negative for - chest pain, dyspnea on exertion, edema or irregular heartbeat Gastrointestinal ROS: negative for - abdominal pain, diarrhea, hematemesis, nausea/vomiting or  stool incontinence Genito-Urinary ROS: negative for - dysuria, hematuria, incontinence or urinary frequency/urgency Musculoskeletal ROS: negative for - joint swelling or muscular weakness Neurological ROS: as noted in HPI Dermatological ROS: negative for rash and skin lesion changes   Blood pressure 150/66, pulse 59, temperature 97.7 F (36.5 C), temperature source Oral, resp. rate 18, height 5\' 11"  (1.803 m), weight 74.39 kg (164 lb), SpO2 97 %.   Neurologic Examination:                                                                                                      HEENT-  Normocephalic, no lesions, without obvious abnormality.  Normal external eye and conjunctiva.  Normal TM's bilaterally.  Normal auditory canals and external ears. Normal external nose, mucus membranes and septum.  Normal pharynx. Cardiovascular- S1, S2 normal, pulses palpable throughout   Lungs- chest clear, no wheezing, rales, normal symmetric air entry Abdomen- normal findings: bowel sounds normal Extremities- no edema Lymph-no adenopathy palpable Musculoskeletal-no joint tenderness, deformity or swelling Skin-warm and dry, no hyperpigmentation, vitiligo,  or suspicious lesions  Neurological Examination Mental Status: Alert, oriented, thought content appropriate.  Speech fluent without evidence of aphasia.  Able to follow 3 step commands without difficulty. Cranial Nerves: II: Discs flat bilaterally; Visual fields grossly normal, pupils equal, round, reactive to light and accommodation III,IV, VI: ptosis not present, extra-ocular motions intact bilaterally V,VII: smile symmetric, facial light touch sensation normal bilaterally VIII: hearing normal bilaterally IX,X: uvula rises symmetrically XI: bilateral shoulder shrug XII: midline tongue extension Motor: Right : Upper extremity   5/5    Left:     Upper extremity   5/5  Lower extremity   5/5     Lower extremity   5/5 Tone and bulk:normal tone throughout; no atrophy noted Sensory: Pinprick and light touch intact throughout UE and has stocking distribution decreased temperature and vibration. Decreased proprioception bilaterally.  Deep Tendon Reflexes: 2+ and symmetric throughout UE and no KJ or AJ Plantars: Mute bilaterally Cerebellar: normal finger-to-nose, normal heel-to-shin test Gait: wide based with shuffled steps.  Positive Romberg.       Lab Results: Basic Metabolic Panel:  Recent Labs Lab 09/27/14 0522  NA 140  K 4.2  CL 108  CO2 26  GLUCOSE 108*  BUN 27*  CREATININE 1.19  CALCIUM 9.3    Liver Function Tests:  Recent Labs Lab 09/27/14 0522  AST 28  ALT 20  ALKPHOS 59  BILITOT 0.9  PROT 6.3  ALBUMIN 4.0   No results for input(s): LIPASE, AMYLASE in the last 168 hours. No results for input(s): AMMONIA in the last 168 hours.  CBC:  Recent Labs Lab 09/27/14 0522  WBC 7.7  NEUTROABS 6.3  HGB 14.3  HCT 41.9  MCV 84.3  PLT 100*    Cardiac Enzymes:  Recent Labs Lab 09/27/14 0522  TROPONINI <0.03    Lipid Panel: No results for input(s): CHOL, TRIG, HDL, CHOLHDL, VLDL, LDLCALC in the last 168 hours.  CBG: No results for input(s): GLUCAP  in the last 168 hours.  Microbiology: Results for  orders placed or performed during the hospital encounter of 11/18/09  Urine culture     Status: None   Collection Time: 11/18/09  3:23 PM  Result Value Ref Range Status   Specimen Description URINE, CLEAN CATCH  Final   Special Requests NONE  Final   Colony Count NO GROWTH  Final   Culture NO GROWTH  Final   Report Status 11/19/2009 FINAL  Final  MRSA PCR Screening     Status: None   Collection Time: 11/18/09  5:00 PM  Result Value Ref Range Status   MRSA by PCR  NEGATIVE Final    NEGATIVE        The GeneXpert MRSA Assay (FDA approved for NASAL specimens only), is one component of a comprehensive MRSA colonization surveillance program. It is not intended to diagnose MRSA infection nor to guide or monitor treatment for MRSA infections.    Coagulation Studies: No results for input(s): LABPROT, INR in the last 72 hours.  Imaging: [name] Contrast  09/27/2014   CLINICAL DATA:  Acute onset gait imbalance 3 a.m., lower extremity weakness, subjective symptoms of heart attack. History of dizziness, hypertension, and heart attack.  EXAM: MRI HEAD WITHOUT CONTRAST  TECHNIQUE: Multiplanar, multiecho pulse sequences of the brain and surrounding structures were obtained without intravenous contrast.  COMPARISON:  CT of the head Nov 18, 2009  FINDINGS: No reduced diffusion to suggest acute ischemia. No susceptibility artifact to suggest acute hemorrhage.  The ventricles and sulci are normal for patient's age, however there is mild ex vacuo dilatation LEFT occipital horn associated with LEFT occipital encephalomalacia, trace amount of RIGHT occipital encephalomalacia noted. RIGHT inferior frontal lobe cystic encephalomalacia. LEFT temporal encephalomalacia with faint susceptibility artifact. Mild to moderate white matter changes can be seen with chronic small vessel ischemic disease. No midline shift, mass effect or mass lesions.  No abnormal  extra-axial fluid collections. Major intracranial vascular flow voids observed at the skull base. Mild dolichoectasia of the intracranial vessels can be seen with chronic hypertension. Status post bilateral ocular lens implants. Bilateral maxillary small mucosal retention cyst without paranasal sinus air-fluid levels. The mastoid air cells are well aerated. No abnormal sellar expansion. No cerebellar tonsillar ectopia. Slightly fragmented appearance of the longus coli insertion can be associated with tendinopathy.  IMPRESSION: No acute intracranial process, specifically no acute ischemia.  LEFT greater than RIGHT occipital lobe encephalomalacia may be posttraumatic or reflect remote posterior cerebral artery territory infarcts. RIGHT inferior frontal lobe encephalomalacia is likely posttraumatic. LEFT temporal hemorrhagic encephalomalacia may reflect remote hemorrhagic infarct or traumatic hematoma.  Involutional changes. Mild to moderate white matter changes suggest chronic small vessel ischemic disease.   Electronically Signed   By: Elon Alas   On: 09/27/2014 06:53       Assessment and plan per attending neurologist  Etta Quill PA-C [facility] [phone]  09/27/2014, 12:19 PM   Assessment/Plan: 81 YO male with chronic shuffled gait, difficulty walking in the dark and chronic decreased sensation in LE now presenting to the ED with increased gait difficulty. MRI brain shows no acute abnormalities. Exam is consistent with bilateral LE peripheral neuropathy, wide based gait and shuffled steps. Etiology likely multifactorial in setting of chronic instability from peripheral neuropathy and cervical disc disease.  Symptoms improved with hydration --component of dehydration and possible UTI likely contributing to gait issues.    Recommend: 1) Cervical spine MRI 2) repeat orthostatic with each BP taken in 3 minute intervals.  3) check B12, TSH,  SPEP 4) Rehab consultation 5)  Would benefit from EMG/NCS as outpatient  Alan Like, DO [facility] [phone]  If 7pm- 7am, please page neurology on call as listed in [place].

## 2014-09-27 NOTE — ED Notes (Signed)
Pt denies dizziness with standing however states that he felt dizzy once he sat back down.

## 2014-09-27 NOTE — ED Provider Notes (Signed)
CSN: [id number]     Arrival date & time 09/27/14  0354 History   First MD Initiated Contact with Patient 09/27/14 [phone]     Chief Complaint  Patient presents with  . Weakness  . Legs Tingling      (Consider location/radiation/quality/duration/timing/severity/associated sxs/prior Treatment) Patient is a 81 y.o. male presenting with weakness. The history is provided by the patient.  Weakness  He woke up to go to the bathroom and noticed that he was off balance. This was at about 2 AM. He Back in bed and noticed that his heart was pounding. He denies chest pain, heaviness, tightness, pressure. He denies dyspnea, nausea, diaphoresis. He was worried that he might be having a heart attack so they called EMS who transported him. He does not feel dizzy if he lays flat but he starts to feel dizzy if he sits up or stands up. There has been no headache.  Past Medical History  Diagnosis Date  . Myocardial infarct 03/21/2008  . Coronary atherosclerosis of native coronary artery     a. Anterior STEMI 2009 s/p BMSx2 to mid & prox LAD and angioplasty to distal LAD. total RCA with collaterals, moderate Cx plaquing. a. EF 55-60% in 2013.  . Pure hypercholesterolemia     a. Has not tolerated statins in the past.  . Phlebitis and thrombophlebitis of superficial vessels of lower extremities   . Gross hematuria   . Paroxysmal atrial flutter     a. Abnl EKG 01/2012 concerning for atrial flutter, event monitor showed sinus bradycardia only and no pauses. Not felt to be a candidate for longterm anticoag due to hematuria.  . Sinus bradycardia     a. By prior event monitor.  [name] NSVT (nonsustained ventricular tachycardia)     a. Per DC summary from time of STEMI 2009.  [name] HTN (hypertension)   . Right leg DVT     a. Dx 01/2014.  [name] Thrombocytopenia    Past Surgical History  Procedure Laterality Date  . Replacement total knee bilateral    . Carpal tunnel release    . Trigger finger surgery     Family History   Problem Relation Age of Onset  . Cancer Sister   . Cancer Sister    History  Substance Use Topics  . Smoking status: Never Smoker   . Smokeless tobacco: Never Used  . Alcohol Use: No    Review of Systems  Neurological: Positive for weakness.  All other systems reviewed and are negative.     Allergies  Rosuvastatin  Home Medications   Prior to Admission medications   Medication Sig Start Date End Date Taking? Authorizing Provider  amLODipine (NORVASC) 5 MG tablet Take 5 mg by mouth daily.    Historical Provider, MD  amLODipine (NORVASC) 5 MG tablet TAKE 1 TABLET (5 MG TOTAL) BY MOUTH DAILY. 06/19/14   Burnell Blanks, MD  aspirin EC 81 MG tablet Take 1 tablet (81 mg total) by mouth daily. 06/03/14   Burnell Blanks, MD  metoprolol tartrate (LOPRESSOR) 25 MG tablet Take 12.5 mg by mouth 2 (two) times daily.    Historical Provider, MD  metoprolol tartrate (LOPRESSOR) 25 MG tablet TAKE 1/2 TABLET BY MOUTH 2 TIMES DAILY 06/26/14   Burnell Blanks, MD  nitroGLYCERIN (NITROSTAT) 0.4 MG SL tablet Place 0.4 mg under the tongue every 5 (five) minutes as needed for chest pain.    Historical Provider, MD  pregabalin (LYRICA) 75 MG capsule Take 75 mg  by mouth daily.     Historical Provider, MD  Red Yeast Rice Extract (RED YEAST RICE PO) Take 1,200 mg by mouth daily.    Historical Provider, MD   BP 148/62 mmHg  Pulse 60  Temp(Src) 97.6 F (36.4 C) (Oral)  Resp 14  Ht 5\' 11"  (1.803 m)  Wt 164 lb (74.39 kg)  BMI 22.88 kg/m2  SpO2 95% Physical Exam  Nursing note and vitals reviewed.  81 year old male, resting comfortably and in no acute distress. Vital signs are significant for hypertension. Oxygen saturation is 95%, which is normal. Head is normocephalic and atraumatic. PERRLA, EOMI. Oropharynx is clear. Fundi show no hemorrhage, exudate, or papilledema. Neck is nontender and supple without adenopathy or JVD. There are no carotid bruits. Back is nontender and  there is no CVA tenderness. Lungs are clear without rales, wheezes, or rhonchi. Chest is nontender. Heart has regular rate and rhythm without murmur. Abdomen is soft, flat, nontender without masses or hepatosplenomegaly and peristalsis is normoactive. Extremities have no cyanosis or edema, full range of motion is present. Skin is warm and dry without rash. Neurologic: Mental status is normal, cranial nerves are intact, there are no motor or sensory deficits. Finger-to-nose testing is normal. On Romberg testing, he consistently falls backward.  ED Course  Procedures (including critical care time) Labs Review Results for orders placed or performed during the hospital encounter of 09/27/14  Comprehensive metabolic panel  Result Value Ref Range   Sodium 140 135 - 145 mmol/L   Potassium 4.2 3.5 - 5.1 mmol/L   Chloride 108 96 - 112 mmol/L   CO2 26 19 - 32 mmol/L   Glucose, Bld 108 (H) 70 - 99 mg/dL   BUN 27 (H) 6 - 23 mg/dL   Creatinine, Ser 1.19 0.50 - 1.35 mg/dL   Calcium 9.3 8.4 - 10.5 mg/dL   Total Protein 6.3 6.0 - 8.3 g/dL   Albumin 4.0 3.5 - 5.2 g/dL   AST 28 0 - 37 U/L   ALT 20 0 - 53 U/L   Alkaline Phosphatase 59 39 - 117 U/L   Total Bilirubin 0.9 0.3 - 1.2 mg/dL   GFR calc non Af Amer 53 (L) >90 mL/min   GFR calc Af Amer 61 (L) >90 mL/min   Anion gap 6 5 - 15  Troponin I  Result Value Ref Range   Troponin I <0.03 <0.031 ng/mL  CBC with Differential  Result Value Ref Range   WBC 7.7 4.0 - 10.5 K/uL   RBC 4.97 4.22 - 5.81 MIL/uL   Hemoglobin 14.3 13.0 - 17.0 g/dL   HCT 41.9 39.0 - 52.0 %   MCV 84.3 78.0 - 100.0 fL   MCH 28.8 26.0 - 34.0 pg   MCHC 34.1 30.0 - 36.0 g/dL   RDW 13.7 11.5 - 15.5 %   Platelets 100 (L) 150 - 400 K/uL   Neutrophils Relative % 82 (H) 43 - 77 %   Neutro Abs 6.3 1.7 - 7.7 K/uL   Lymphocytes Relative 12 12 - 46 %   Lymphs Abs 1.0 0.7 - 4.0 K/uL   Monocytes Relative 5 3 - 12 %   Monocytes Absolute 0.4 0.1 - 1.0 K/uL   Eosinophils Relative 1 0  - 5 %   Eosinophils Absolute 0.0 0.0 - 0.7 K/uL   Basophils Relative 0 0 - 1 %   Basophils Absolute 0.0 0.0 - 0.1 K/uL  Urinalysis, Routine w reflex microscopic  Result Value Ref  Range   Color, Urine YELLOW YELLOW   APPearance CLOUDY (A) CLEAR   Specific Gravity, Urine 1.010 1.005 - 1.030   pH 7.0 5.0 - 8.0   Glucose, UA NEGATIVE NEGATIVE mg/dL   Hgb urine dipstick MODERATE (A) NEGATIVE   Bilirubin Urine NEGATIVE NEGATIVE   Ketones, ur NEGATIVE NEGATIVE mg/dL   Protein, ur NEGATIVE NEGATIVE mg/dL   Urobilinogen, UA 0.2 0.0 - 1.0 mg/dL   Nitrite NEGATIVE NEGATIVE   Leukocytes, UA NEGATIVE NEGATIVE  Urine microscopic-add on  Result Value Ref Range   Squamous Epithelial / LPF RARE RARE   WBC, UA 0-2 <3 WBC/hpf   RBC / HPF 3-6 <3 RBC/hpf   Bacteria, UA MANY (A) RARE   Imaging Review [name] Contrast  09/27/2014   CLINICAL DATA:  Acute onset gait imbalance 3 a.m., lower extremity weakness, subjective symptoms of heart attack. History of dizziness, hypertension, and heart attack.  EXAM: MRI HEAD WITHOUT CONTRAST  TECHNIQUE: Multiplanar, multiecho pulse sequences of the brain and surrounding structures were obtained without intravenous contrast.  COMPARISON:  CT of the head Nov 18, 2009  FINDINGS: No reduced diffusion to suggest acute ischemia. No susceptibility artifact to suggest acute hemorrhage.  The ventricles and sulci are normal for patient's age, however there is mild ex vacuo dilatation LEFT occipital horn associated with LEFT occipital encephalomalacia, trace amount of RIGHT occipital encephalomalacia noted. RIGHT inferior frontal lobe cystic encephalomalacia. LEFT temporal encephalomalacia with faint susceptibility artifact. Mild to moderate white matter changes can be seen with chronic small vessel ischemic disease. No midline shift, mass effect or mass lesions.  No abnormal extra-axial fluid collections. Major intracranial vascular flow voids observed at the skull base. Mild  dolichoectasia of the intracranial vessels can be seen with chronic hypertension. Status post bilateral ocular lens implants. Bilateral maxillary small mucosal retention cyst without paranasal sinus air-fluid levels. The mastoid air cells are well aerated. No abnormal sellar expansion. No cerebellar tonsillar ectopia. Slightly fragmented appearance of the longus coli insertion can be associated with tendinopathy.  IMPRESSION: No acute intracranial process, specifically no acute ischemia.  LEFT greater than RIGHT occipital lobe encephalomalacia may be posttraumatic or reflect remote posterior cerebral artery territory infarcts. RIGHT inferior frontal lobe encephalomalacia is likely posttraumatic. LEFT temporal hemorrhagic encephalomalacia may reflect remote hemorrhagic infarct or traumatic hematoma.  Involutional changes. Mild to moderate white matter changes suggest chronic small vessel ischemic disease.   Electronically Signed   By: Elon Alas   On: 09/27/2014 06:53   Images viewed by me.   EKG Interpretation   Date/Time:  Friday September 27 2014 04:10:46 EDT Ventricular Rate:  62 PR Interval:  192 QRS Duration: 118 QT Interval:  419 QTC Calculation: 425 R Axis:   -87 Text Interpretation:  Sinus rhythm Left anterior fascicular block Anterior  infarct, old Incomplete right bundle branch block When compared with ECG  of 11/20/2009, Left anterior fasicular block is now Present Confirmed by  [facility]  MD, Tremar Wickens ([id number]) on 09/27/2014 4:38:51 AM      MDM   Final diagnoses:  Dizziness  Urinary tract infection without hematuria, site unspecified  Central positional vertigo, unspecified laterality    Will get MRI scan to evaluate for possible stroke.  MRI shows no acute stroke. Laboratory workup is significant for probable urinary tract infection with many bacteria. Patient still has inability to stand so I am planning to admit him. Case is discussed with internal medicine teaching service who  agrees to admit the patient.  In the meantime, he has been started on ceftriaxone for his urinary tract infection.  Delora Fuel, MD [phone]

## 2014-09-28 DIAGNOSIS — R3129 Other microscopic hematuria: Secondary | ICD-10-CM | POA: Diagnosis present

## 2014-09-28 DIAGNOSIS — G629 Polyneuropathy, unspecified: Principal | ICD-10-CM

## 2014-09-28 DIAGNOSIS — Z95818 Presence of other cardiac implants and grafts: Secondary | ICD-10-CM

## 2014-09-28 DIAGNOSIS — I2510 Atherosclerotic heart disease of native coronary artery without angina pectoris: Secondary | ICD-10-CM

## 2014-09-28 DIAGNOSIS — R270 Ataxia, unspecified: Secondary | ICD-10-CM

## 2014-09-28 DIAGNOSIS — R312 Other microscopic hematuria: Secondary | ICD-10-CM

## 2014-09-28 HISTORY — DX: Other microscopic hematuria: R31.29

## 2014-09-28 LAB — TSH: TSH: 2.578 u[IU]/mL (ref 0.350–4.500)

## 2014-09-28 MED ORDER — GADOBENATE DIMEGLUMINE 529 MG/ML IV SOLN
15.0000 mL | Freq: Once | INTRAVENOUS | Status: AC | PRN
  Administered 2014-09-28: 15 mL via INTRAVENOUS

## 2014-09-28 NOTE — Discharge Instructions (Signed)
Stop taking lyrica as this can cause dizziness and gait instability. You will need to follow up with your PCP and lyrica can be restarted as needed.

## 2014-09-28 NOTE — Discharge Summary (Signed)
Name: [name] MRN: [id number] DOB: [birth year] 81 y.o. PCP: Nicoletta Dress, MD  Date of Admission: 09/27/2014  3:54 AM Date of Discharge: 09/28/2014 Attending Physician: Annia Belt, MD  Discharge Diagnosis: Principal Problem:   Unsteady gait Active Problems:   Urinary retention   Coronary atherosclerosis of native coronary artery   HTN (hypertension)   Gait instability  Discharge Medications:   Medication List    STOP taking these medications        LYRICA 75 MG capsule  Generic drug:  pregabalin      TAKE these medications        amLODipine 5 MG tablet  Commonly known as:  NORVASC  TAKE 1 TABLET (5 MG TOTAL) BY MOUTH DAILY.     aspirin EC 81 MG tablet  Take 1 tablet (81 mg total) by mouth daily.     celecoxib 200 MG capsule  Commonly known as:  CELEBREX  Take 200 mg by mouth daily.     metoprolol tartrate 25 MG tablet  Commonly known as:  LOPRESSOR  TAKE 1/2 TABLET BY MOUTH 2 TIMES DAILY     nitroGLYCERIN 0.4 MG SL tablet  Commonly known as:  NITROSTAT  Place 0.4 mg under the tongue every 5 (five) minutes as needed for chest pain.     RED YEAST RICE PO  Take 1,200 mg by mouth daily.        Disposition and follow-up:   Mr.Gearald S [name] was discharged from [facility] in Stable condition.  At the hospital follow up visit please address:  1.  Please ensure patient complete course of Keflex BID x 7 days for UTI. This prescription was called in after pt was discharged.   2.  Labs / imaging needed at time of follow-up: UA to investigate microscopic hematuria on admission   3.  Pending labs/ test needing follow-up: SPEP  4. lyrica held on discharge, please resume as needed.   5. Can consider outpatient EMG/NCS    Consultations:  neurology  Procedures Performed:  [name] Contrast  09/27/2014   CLINICAL DATA:  Acute onset gait imbalance 3 a.m., lower extremity weakness, subjective symptoms of heart  attack. History of dizziness, hypertension, and heart attack.  EXAM: MRI HEAD WITHOUT CONTRAST  TECHNIQUE: Multiplanar, multiecho pulse sequences of the brain and surrounding structures were obtained without intravenous contrast.  COMPARISON:  CT of the head Nov 18, 2009  FINDINGS: No reduced diffusion to suggest acute ischemia. No susceptibility artifact to suggest acute hemorrhage.  The ventricles and sulci are normal for patient's age, however there is mild ex vacuo dilatation LEFT occipital horn associated with LEFT occipital encephalomalacia, trace amount of RIGHT occipital encephalomalacia noted. RIGHT inferior frontal lobe cystic encephalomalacia. LEFT temporal encephalomalacia with faint susceptibility artifact. Mild to moderate white matter changes can be seen with chronic small vessel ischemic disease. No midline shift, mass effect or mass lesions.  No abnormal extra-axial fluid collections. Major intracranial vascular flow voids observed at the skull base. Mild dolichoectasia of the intracranial vessels can be seen with chronic hypertension. Status post bilateral ocular lens implants. Bilateral maxillary small mucosal retention cyst without paranasal sinus air-fluid levels. The mastoid air cells are well aerated. No abnormal sellar expansion. No cerebellar tonsillar ectopia. Slightly fragmented appearance of the longus coli insertion can be associated with tendinopathy.  IMPRESSION: No acute intracranial process, specifically no acute ischemia.  LEFT greater than RIGHT occipital lobe encephalomalacia may be posttraumatic or  reflect remote posterior cerebral artery territory infarcts. RIGHT inferior frontal lobe encephalomalacia is likely posttraumatic. LEFT temporal hemorrhagic encephalomalacia may reflect remote hemorrhagic infarct or traumatic hematoma.  Involutional changes. Mild to moderate white matter changes suggest chronic small vessel ischemic disease.   Electronically Signed   By: Elon Alas   On: 09/27/2014 06:53   Mr Cervical Spine W Wo Contrast  09/28/2014   CLINICAL DATA:  Gait imbalance. History of cervical spine fractures.  EXAM: MRI CERVICAL SPINE WITHOUT AND WITH CONTRAST  TECHNIQUE: Multiplanar and multiecho pulse sequences of the cervical spine, to include the craniocervical junction and cervicothoracic junction, were obtained according to standard protocol without and with intravenous contrast.  CONTRAST:  57mL MULTIHANCE GADOBENATE DIMEGLUMINE 529 MG/ML IV SOLN  COMPARISON:  MRI of the brain September 27, 2014 at 0637 hours CT of the cervical spine Nov 18, 2009  FINDINGS: Cervical vertebral bodies appear intact, as straightened cervical lordosis. Grade 1 C4-5 anterolisthesis, grade 1 C5-6 anterolisthesis, grade 1 C7 anterolisthesis. No spondylolysis. Solid C6-7 interbody auto arthrodesis, suspected arthrodesis C3-4 with bone mineral signal. Possibly mineral C3-4 disc, the remaining discs demonstrate mild desiccation. Moderate chronic discogenic endplate changes 075-GRM, moderate to severe at C7-T1 without STIR signal abnormality to suggest acute osseous process. No suspicious osseous nor intradiscal enhancement.  Cervical spinal cord appears normal morphology and signal characteristics from the cervical medullary junction to level T2-3, the most caudal well visualized level. No abnormal cord, leptomeningeal or epidural enhancement. Included view of the brain demonstrates bilateral occipital lobe encephalomalacia better seen on yesterday's MRI of the brain. Moderate symmetric paraspinal muscle atrophy.  Level by level evaluation:  C2-3: Uncovertebral hypertrophy and moderate to severe facet arthropathy without canal stenosis. Mild RIGHT neural foraminal narrowing.  C3-4: Small broad-based disc bulge, uncovertebral hypertrophy. Severe RIGHT facet arthropathy without canal stenosis. Moderate bilateral neural foraminal narrowing.  C4-5: Anterolisthesis. Annular bulging, uncovertebral  hypertrophy and moderate to severe facet arthropathy without canal stenosis. Mild to moderate LEFT greater than RIGHT neural foraminal narrowing.  C5-6: Anterolisthesis. Small broad-based disc bulge, uncovertebral hypertrophy. Severe LEFT and at least moderate RIGHT facet arthropathy. Mild canal stenosis. Severe LEFT greater than RIGHT neural foraminal narrowing.  C6-7: Small broad-based disc osteophyte complex, moderate facet arthropathy. Mild canal stenosis. Severe RIGHT, moderate LEFT neural foraminal narrowing.  C7-T1: Anterolisthesis. Moderate facet arthropathy without canal stenosis. Mild RIGHT neural foraminal narrowing.  IMPRESSION: No acute fracture or abnormal enhancement of the cervical spine.  Grade 1 C4-5 anterolisthesis, grade 1 C5-6 anterolisthesis, grade 1 C7 anterolisthesis without spondylolysis.  Degenerative change of the cervical spine results in mild canal stenosis at C5-6 and C6-7. Neural foraminal narrowing at all cervical levels: Severe at C5-6 and C6-7.   Electronically Signed   By: Elon Alas   On: 09/28/2014 04:38    Admission HPI: Mr. Torrealba is an 81 year old gentleman with past medical history of CAD, degenerative disc disease, peripheral neuropathy, hypertension, urinary retention and a history of atrial flutter who presents to the ED with gait instability, which he started last night. Patient states that he woke up to go to the bathroom when he felt that he was unsteady on his feet. He held himself onto furniture and he was able to make his way to the bathroom and returned to his bed. He did not fall. Due to acute onset of the symptoms, patient decided to present to the ED. At baseline, patient has no gait instability and usually does not need to take a  moment when he gets up from a seated position before he starts walking. He does not use a walker even though sometimes he uses a cane when his walking on uneven surfaces. He has no history of dizziness. He does not report  any history of weakness in any of his extremities, no dysarthria, visual, auditory changes, no numbness, or any other stroke symptoms. He did not pass out. Denies confusion. No photophobia or neck pain. He has a very remote history of vertigo many years ago, which resolved. In the ED, his neurologic exam was unremarkable. However, he was unable to walk or stand without support. Brain MRI was negative for stroke. Patient was admitted for evaluation of his gait instability.   Hospital Course by problem list: Principal Problem:   Unsteady gait Active Problems:   Urinary retention   Coronary atherosclerosis of native coronary artery   HTN (hypertension)   Gait instability   Microscopic hematuria   Gait instability: His neurologic exam  Was unremarkable, besides gait instability with ataxia when he tries to stand up and tendency to fall backwards on admission. However, when re-evaluated later in the evening on day of admission pt was able to ambulate to the restroom on his own without assistance. Pt reported feeling better that evening. Brain MRI did not reveal acute stroke even though he has bilateral encephalomalacia and remote infarcts. Lyrica can cause dizziness and ataxia thus was stopped. He has no symptoms of syncope. No cardiopulmonary symptoms. Orthostatics were negative. Neurology saw patient and recommended MRI of cspine which was negative for acute abnormalities. Neuro felt gait instability due to b/l LE peripheral neuropathy (wide based gait and shuffled steps) w/ possible dehydration and UTI contributing to gait issues. TSH and B12 WNL. SPEP pending. s   UTI- UA revealed bacteriuria and moderate hemoglobinuria. Otherwise without nitrites.Urine culture grew klebsiella pneumoniae sensitive to cephalosporins. Pt called and instructed to pick up rx for keflex 500mg  BID x 7 days. Pt also received dose of rocephin in the ED.   Discharge Vitals:   BP 162/74 mmHg  Pulse 64  Temp(Src) 97.7 F  (36.5 C) (Oral)  Resp 18  Ht 5\' 11"  (1.803 m)  Wt 152 lb (68.947 kg)  BMI 21.21 kg/m2  SpO2 96%  Discharge Labs:  Results for orders placed or performed during the hospital encounter of 09/27/14 (from the past 24 hour(s))  TSH     Status: None   Collection Time: 09/28/14  4:08 AM  Result Value Ref Range   TSH 2.578 0.350 - 4.500 uIU/mL    Signed: Norman Herrlich, MD 09/28/2014, 9:42 AM    Services Ordered on Discharge: home health PT Equipment Ordered on Discharge: none

## 2014-09-28 NOTE — Progress Notes (Signed)
Subjective: Pt has no complaints this morning. Denies dizziness and was able to ambulate during exam today.   Objective: Vital signs in last 24 hours: Filed Vitals:   09/27/14 1600 09/27/14 2016 09/27/14 2141 09/28/14 0500  BP: 133/72 153/65  162/74  Pulse:  60 61 64  Temp:  98 F (36.7 C)  97.7 F (36.5 C)  TempSrc:  Oral  Oral  Resp:      Height:    5\' 11"  (1.803 m)  Weight:    152 lb (68.947 kg)  SpO2:  97%  96%   Weight change: -6 lb 3.2 oz (-2.812 kg)  Intake/Output Summary (Last 24 hours) at 09/28/14 [id number] Last data filed at 09/28/14 0448  Gross per 24 hour  Intake      0 ml  Output    700 ml  Net   -700 ml   General: NAD, laying in bed comfortably HEENT: moist mucous membranes, EOMI Lungs: CTAB, no wheezing Cardiac: RRR, no murmurs GI: soft, active bowel sounds, non TTP Neuro: CN II-XII grossly intact MSK: able to get patient standing up and ambulating without assistance, wide based gait  Lab Results: Basic Metabolic Panel:  Recent Labs Lab 09/27/14 0522  NA 140  K 4.2  CL 108  CO2 26  GLUCOSE 108*  BUN 27*  CREATININE 1.19  CALCIUM 9.3   Liver Function Tests:  Recent Labs Lab 09/27/14 0522  AST 28  ALT 20  ALKPHOS 59  BILITOT 0.9  PROT 6.3  ALBUMIN 4.0   CBC:  Recent Labs Lab 09/27/14 0522  WBC 7.7  NEUTROABS 6.3  HGB 14.3  HCT 41.9  MCV 84.3  PLT 100*   Cardiac Enzymes:  Recent Labs Lab 09/27/14 0522  TROPONINI <0.03   Thyroid Function Tests:  Recent Labs Lab 09/28/14 0408  TSH 2.578   Urinalysis:  Recent Labs Lab 09/27/14 0530  COLORURINE YELLOW  LABSPEC 1.010  PHURINE 7.0  GLUCOSEU NEGATIVE  HGBUR MODERATE*  BILIRUBINUR NEGATIVE  KETONESUR NEGATIVE  PROTEINUR NEGATIVE  UROBILINOGEN 0.2  NITRITE NEGATIVE  LEUKOCYTESUR NEGATIVE   Studies/Results: [name] Contrast  09/27/2014   CLINICAL DATA:  Acute onset gait imbalance 3 a.m., lower extremity weakness, subjective symptoms of heart attack.  History of dizziness, hypertension, and heart attack.  EXAM: MRI HEAD WITHOUT CONTRAST  TECHNIQUE: Multiplanar, multiecho pulse sequences of the brain and surrounding structures were obtained without intravenous contrast.  COMPARISON:  CT of the head Nov 18, 2009  FINDINGS: No reduced diffusion to suggest acute ischemia. No susceptibility artifact to suggest acute hemorrhage.  The ventricles and sulci are normal for patient's age, however there is mild ex vacuo dilatation LEFT occipital horn associated with LEFT occipital encephalomalacia, trace amount of RIGHT occipital encephalomalacia noted. RIGHT inferior frontal lobe cystic encephalomalacia. LEFT temporal encephalomalacia with faint susceptibility artifact. Mild to moderate white matter changes can be seen with chronic small vessel ischemic disease. No midline shift, mass effect or mass lesions.  No abnormal extra-axial fluid collections. Major intracranial vascular flow voids observed at the skull base. Mild dolichoectasia of the intracranial vessels can be seen with chronic hypertension. Status post bilateral ocular lens implants. Bilateral maxillary small mucosal retention cyst without paranasal sinus air-fluid levels. The mastoid air cells are well aerated. No abnormal sellar expansion. No cerebellar tonsillar ectopia. Slightly fragmented appearance of the longus coli insertion can be associated with tendinopathy.  IMPRESSION: No acute intracranial process, specifically no acute ischemia.  LEFT greater than RIGHT  occipital lobe encephalomalacia may be posttraumatic or reflect remote posterior cerebral artery territory infarcts. RIGHT inferior frontal lobe encephalomalacia is likely posttraumatic. LEFT temporal hemorrhagic encephalomalacia may reflect remote hemorrhagic infarct or traumatic hematoma.  Involutional changes. Mild to moderate white matter changes suggest chronic small vessel ischemic disease.   Electronically Signed   By: Elon Alas    On: 09/27/2014 06:53   Mr Cervical Spine W Wo Contrast  09/28/2014   CLINICAL DATA:  Gait imbalance. History of cervical spine fractures.  EXAM: MRI CERVICAL SPINE WITHOUT AND WITH CONTRAST  TECHNIQUE: Multiplanar and multiecho pulse sequences of the cervical spine, to include the craniocervical junction and cervicothoracic junction, were obtained according to standard protocol without and with intravenous contrast.  CONTRAST:  62mL MULTIHANCE GADOBENATE DIMEGLUMINE 529 MG/ML IV SOLN  COMPARISON:  MRI of the brain September 27, 2014 at 0637 hours CT of the cervical spine Nov 18, 2009  FINDINGS: Cervical vertebral bodies appear intact, as straightened cervical lordosis. Grade 1 C4-5 anterolisthesis, grade 1 C5-6 anterolisthesis, grade 1 C7 anterolisthesis. No spondylolysis. Solid C6-7 interbody auto arthrodesis, suspected arthrodesis C3-4 with bone mineral signal. Possibly mineral C3-4 disc, the remaining discs demonstrate mild desiccation. Moderate chronic discogenic endplate changes [id number]-GRM, moderate to severe at C7-T1 without STIR signal abnormality to suggest acute osseous process. No suspicious osseous nor intradiscal enhancement.  Cervical spinal cord appears normal morphology and signal characteristics from the cervical medullary junction to level T2-3, the most caudal well visualized level. No abnormal cord, leptomeningeal or epidural enhancement. Included view of the brain demonstrates bilateral occipital lobe encephalomalacia better seen on yesterday's MRI of the brain. Moderate symmetric paraspinal muscle atrophy.  Level by level evaluation:  C2-3: Uncovertebral hypertrophy and moderate to severe facet arthropathy without canal stenosis. Mild RIGHT neural foraminal narrowing.  C3-4: Small broad-based disc bulge, uncovertebral hypertrophy. Severe RIGHT facet arthropathy without canal stenosis. Moderate bilateral neural foraminal narrowing.  C4-5: Anterolisthesis. Annular bulging, uncovertebral hypertrophy and  moderate to severe facet arthropathy without canal stenosis. Mild to moderate LEFT greater than RIGHT neural foraminal narrowing.  C5-6: Anterolisthesis. Small broad-based disc bulge, uncovertebral hypertrophy. Severe LEFT and at least moderate RIGHT facet arthropathy. Mild canal stenosis. Severe LEFT greater than RIGHT neural foraminal narrowing.  C6-7: Small broad-based disc osteophyte complex, moderate facet arthropathy. Mild canal stenosis. Severe RIGHT, moderate LEFT neural foraminal narrowing.  C7-T1: Anterolisthesis. Moderate facet arthropathy without canal stenosis. Mild RIGHT neural foraminal narrowing.  IMPRESSION: No acute fracture or abnormal enhancement of the cervical spine.  Grade 1 C4-5 anterolisthesis, grade 1 C5-6 anterolisthesis, grade 1 C7 anterolisthesis without spondylolysis.  Degenerative change of the cervical spine results in mild canal stenosis at C5-6 and C6-7. Neural foraminal narrowing at all cervical levels: Severe at C5-6 and C6-7.   Electronically Signed   By: Elon Alas   On: 09/28/2014 04:38   Medications: I have reviewed the patient's current medications. Scheduled Meds: . amLODipine  5 mg Oral Daily  . aspirin EC  81 mg Oral Daily  . enoxaparin (LOVENOX) injection  40 mg Subcutaneous Q24H  . metoprolol tartrate  12.5 mg Oral BID   Continuous Infusions:  PRN Meds:. Assessment/Plan: Principal Problem:   Unsteady gait Active Problems:   Urinary retention   Coronary atherosclerosis of native coronary artery   HTN (hypertension)   Gait instability  Gait instability: resolved, will d/c home pending PT recommendations. MRI of c spine neg for cord compression and any acute abnormalities. TSH WNL. - neurology following-b12 level and  SPEP pending, will recommend neuro f/u on d/c -Hold Lyrica for now as it can cause ataxia and dizziness -PT and OT evaluation.  CAD: History of stent placement. Troponin is negative x 1. EKG without acute changes. -Continue home  metoprolol and aspirin  Abnormal urinalysis: UA revealed bacteriuria and moderate hemoglobinuria. Otherwise without nitrites. Patient's on this possible symptoms increased frequence. He received ceftriaxone in the ED. Will stop abx and recommend repeat UA as outpatient on discharge.   Code Status: Full code   F/E/N: Heart Health  VTE Ppx: SubQ Lovenox  Dispo: Discharge home today after physical therapy sees patient for further recommendations.   The patient does have a current PCP Nicoletta Dress, MD) and does not need an [facility] hospital follow-up appointment after discharge.  The patient does not have transportation limitations that hinder transportation to clinic appointments.  .Services Needed at time of discharge: Y = Yes, Blank = No PT:   OT:   RN:   Equipment:   Other:     LOS: 1 day   Norman Herrlich, MD 09/28/2014, 9:22 AM

## 2014-09-28 NOTE — Evaluation (Signed)
Physical Therapy Evaluation Patient Details Name: [name] MRN: [id number] DOB: 01-18-1924 Today's Date: 09/28/2014   History of Present Illness  pt presents with dizziness and unsteady gait.    Clinical Impression  Pt seems to be at baseline level of mobility per pt and pt's son.  Pt mildly unsteady with flexed posture, but no LOB.  Feel pt would benefit from HHPT for home safety eval.  Will sign off.      Follow Up Recommendations Home health PT;Supervision - Intermittent    Equipment Recommendations  None recommended by PT    Recommendations for Other Services       Precautions / Restrictions Precautions Precautions: Fall Restrictions Weight Bearing Restrictions: No      Mobility  Bed Mobility Overal bed mobility: Modified Independent                Transfers Overall transfer level: Modified independent Equipment used: None                Ambulation/Gait Ambulation/Gait assistance: Supervision Ambulation Distance (Feet): 100 Feet Assistive device: None Gait Pattern/deviations: Step-through pattern;Decreased stride length;Wide base of support;Trunk flexed     General Gait Details: Per son pt is ambulating at baseline level.  No LOB, but mildly unsteady.  pt wearing shoes during ambulation and indicates he always has shoes on during the day at home.    Stairs            Wheelchair Mobility    Modified Rankin (Stroke Patients Only)       Balance Overall balance assessment: Needs assistance Sitting-balance support: No upper extremity supported;Feet supported Sitting balance-Leahy Scale: Good     Standing balance support: No upper extremity supported Standing balance-Leahy Scale: Fair                               Pertinent Vitals/Pain Pain Assessment: No/denies pain    Home Living Family/patient expects to be discharged to:: Private residence Living Arrangements: Spouse/significant other Available Help at  Discharge: Family;Available 24 hours/day Type of Home: House Home Access: Ramped entrance     Home Layout: One level Home Equipment: Cane - single point      Prior Function Level of Independence: Independent with assistive device(s) (Uses cane outside)               Hand Dominance        Extremity/Trunk Assessment   Upper Extremity Assessment: Overall WFL for tasks assessed           Lower Extremity Assessment: Overall WFL for tasks assessed      Cervical / Trunk Assessment: Kyphotic  Communication   Communication: No difficulties  Cognition Arousal/Alertness: Awake/alert Behavior During Therapy: WFL for tasks assessed/performed Overall Cognitive Status: Within Functional Limits for tasks assessed                      General Comments      Exercises        Assessment/Plan    PT Assessment All further PT needs can be met in the next venue of care  PT Diagnosis Difficulty walking   PT Problem List    PT Treatment Interventions     PT Goals (Current goals can be found in the Care Plan section) Acute Rehab PT Goals PT Goal Formulation: All assessment and education complete, DC therapy    Frequency     Barriers to  discharge        Co-evaluation               End of Session Equipment Utilized During Treatment: Gait belt Activity Tolerance: Patient tolerated treatment well Patient left: in bed;with call bell/phone within reach;with family/visitor present (Sitting EOB) Nurse Communication: Mobility status         Time: 3014-8403 PT Time Calculation (min) (ACUTE ONLY): 13 min   Charges:   PT Evaluation $Initial PT Evaluation Tier I: 1 Procedure     PT G CodesCatarina Hartshorn, [place] [phone] 09/28/2014, 12:15 PM

## 2014-09-28 NOTE — Progress Notes (Signed)
Subjective: No overnight events. Reports feeling better this morning.   MRI C spine shows multi-level degenerative changes with no signs of cord compression.   Objective: Current vital signs: BP 162/74 mmHg  Pulse 64  Temp(Src) 97.7 F (36.5 C) (Oral)  Resp 18  Ht 5\' 11"  (1.803 m)  Wt 68.947 kg (152 lb)  BMI 21.21 kg/m2  SpO2 96% Vital signs in last 24 hours: Temp:  [97.5 F (36.4 C)-98 F (36.7 C)] 97.7 F (36.5 C) (03/19 0500) Pulse Rate:  [51-64] 64 (03/19 0500) Resp:  [12-20] 18 (03/18 1250) BP: (133-171)/(57-80) 162/74 mmHg (03/19 0500) SpO2:  [92 %-99 %] 96 % (03/19 0500) Weight:  [68.947 kg (152 lb)-71.578 kg (157 lb 12.8 oz)] 68.947 kg (152 lb) (03/19 0500)  Intake/Output from previous day: 03/18 0701 - 03/19 0700 In: -  Out: 700 [Urine:700] Intake/Output this shift:   Nutritional status: Diet Heart  Neurologic Exam: Mental Status: Alert, oriented, thought content appropriate. Speech fluent without evidence of aphasia.  Cranial Nerves: II:  Visual fields grossly normal, pupils equal, round, reactive to ligh III,IV, VI: ptosis not present, extra-ocular motions intact bilaterally V,VII: smile symmetric, facial light touch sensation normal bilaterally Motor: Right :Upper extremity 5/5Left: Upper extremity 5/5 Lower extremity 5/5Lower extremity 5/5 Tone and bulk:normal tone throughout; no atrophy noted  Lab Results: Basic Metabolic Panel:  Recent Labs Lab 09/27/14 0522  NA 140  K 4.2  CL 108  CO2 26  GLUCOSE 108*  BUN 27*  CREATININE 1.19  CALCIUM 9.3    Liver Function Tests:  Recent Labs Lab 09/27/14 0522  AST 28  ALT 20  ALKPHOS 59  BILITOT 0.9  PROT 6.3  ALBUMIN 4.0   No results for input(s): LIPASE, AMYLASE in the last 168 hours. No results for input(s): AMMONIA in the last 168 hours.  CBC:  Recent Labs Lab  09/27/14 0522  WBC 7.7  NEUTROABS 6.3  HGB 14.3  HCT 41.9  MCV 84.3  PLT 100*    Cardiac Enzymes:  Recent Labs Lab 09/27/14 0522  TROPONINI <0.03    Lipid Panel: No results for input(s): CHOL, TRIG, HDL, CHOLHDL, VLDL, LDLCALC in the last 168 hours.  CBG: No results for input(s): GLUCAP in the last 168 hours.  Microbiology: Results for orders placed or performed during the hospital encounter of 11/18/09  Urine culture     Status: None   Collection Time: 11/18/09  3:23 PM  Result Value Ref Range Status   Specimen Description URINE, CLEAN CATCH  Final   Special Requests NONE  Final   Colony Count NO GROWTH  Final   Culture NO GROWTH  Final   Report Status 11/19/2009 FINAL  Final  MRSA PCR Screening     Status: None   Collection Time: 11/18/09  5:00 PM  Result Value Ref Range Status   MRSA by PCR  NEGATIVE Final    NEGATIVE        The GeneXpert MRSA Assay (FDA approved for NASAL specimens only), is one component of a comprehensive MRSA colonization surveillance program. It is not intended to diagnose MRSA infection nor to guide or monitor treatment for MRSA infections.    Coagulation Studies: No results for input(s): LABPROT, INR in the last 72 hours.  Imaging: [name] Contrast  09/27/2014   CLINICAL DATA:  Acute onset gait imbalance 3 a.m., lower extremity weakness, subjective symptoms of heart attack. History of dizziness, hypertension, and heart attack.  EXAM: MRI HEAD WITHOUT CONTRAST  TECHNIQUE: Multiplanar, multiecho pulse sequences of the brain and surrounding structures were obtained without intravenous contrast.  COMPARISON:  CT of the head Nov 18, 2009  FINDINGS: No reduced diffusion to suggest acute ischemia. No susceptibility artifact to suggest acute hemorrhage.  The ventricles and sulci are normal for patient's age, however there is mild ex vacuo dilatation LEFT occipital horn associated with LEFT occipital encephalomalacia, trace amount of RIGHT  occipital encephalomalacia noted. RIGHT inferior frontal lobe cystic encephalomalacia. LEFT temporal encephalomalacia with faint susceptibility artifact. Mild to moderate white matter changes can be seen with chronic small vessel ischemic disease. No midline shift, mass effect or mass lesions.  No abnormal extra-axial fluid collections. Major intracranial vascular flow voids observed at the skull base. Mild dolichoectasia of the intracranial vessels can be seen with chronic hypertension. Status post bilateral ocular lens implants. Bilateral maxillary small mucosal retention cyst without paranasal sinus air-fluid levels. The mastoid air cells are well aerated. No abnormal sellar expansion. No cerebellar tonsillar ectopia. Slightly fragmented appearance of the longus coli insertion can be associated with tendinopathy.  IMPRESSION: No acute intracranial process, specifically no acute ischemia.  LEFT greater than RIGHT occipital lobe encephalomalacia may be posttraumatic or reflect remote posterior cerebral artery territory infarcts. RIGHT inferior frontal lobe encephalomalacia is likely posttraumatic. LEFT temporal hemorrhagic encephalomalacia may reflect remote hemorrhagic infarct or traumatic hematoma.  Involutional changes. Mild to moderate white matter changes suggest chronic small vessel ischemic disease.   Electronically Signed   By: Elon Alas   On: 09/27/2014 06:53   Mr Cervical Spine W Wo Contrast  09/28/2014   CLINICAL DATA:  Gait imbalance. History of cervical spine fractures.  EXAM: MRI CERVICAL SPINE WITHOUT AND WITH CONTRAST  TECHNIQUE: Multiplanar and multiecho pulse sequences of the cervical spine, to include the craniocervical junction and cervicothoracic junction, were obtained according to standard protocol without and with intravenous contrast.  CONTRAST:  94mL MULTIHANCE GADOBENATE DIMEGLUMINE 529 MG/ML IV SOLN  COMPARISON:  MRI of the brain September 27, 2014 at 0637 hours CT of the cervical  spine Nov 18, 2009  FINDINGS: Cervical vertebral bodies appear intact, as straightened cervical lordosis. Grade 1 C4-5 anterolisthesis, grade 1 C5-6 anterolisthesis, grade 1 C7 anterolisthesis. No spondylolysis. Solid C6-7 interbody auto arthrodesis, suspected arthrodesis C3-4 with bone mineral signal. Possibly mineral C3-4 disc, the remaining discs demonstrate mild desiccation. Moderate chronic discogenic endplate changes [id number], moderate to severe at C7-T1 without STIR signal abnormality to suggest acute osseous process. No suspicious osseous nor intradiscal enhancement.  Cervical spinal cord appears normal morphology and signal characteristics from the cervical medullary junction to level T2-3, the most caudal well visualized level. No abnormal cord, leptomeningeal or epidural enhancement. Included view of the brain demonstrates bilateral occipital lobe encephalomalacia better seen on yesterday's MRI of the brain. Moderate symmetric paraspinal muscle atrophy.  Level by level evaluation:  C2-3: Uncovertebral hypertrophy and moderate to severe facet arthropathy without canal stenosis. Mild RIGHT neural foraminal narrowing.  C3-4: Small broad-based disc bulge, uncovertebral hypertrophy. Severe RIGHT facet arthropathy without canal stenosis. Moderate bilateral neural foraminal narrowing.  C4-5: Anterolisthesis. Annular bulging, uncovertebral hypertrophy and moderate to severe facet arthropathy without canal stenosis. Mild to moderate LEFT greater than RIGHT neural foraminal narrowing.  C5-6: Anterolisthesis. Small broad-based disc bulge, uncovertebral hypertrophy. Severe LEFT and at least moderate RIGHT facet arthropathy. Mild canal stenosis. Severe LEFT greater than RIGHT neural foraminal narrowing.  C6-7: Small broad-based disc osteophyte complex, moderate facet arthropathy. Mild canal stenosis. Severe RIGHT, moderate LEFT neural  foraminal narrowing.  C7-T1: Anterolisthesis. Moderate facet arthropathy without canal  stenosis. Mild RIGHT neural foraminal narrowing.  IMPRESSION: No acute fracture or abnormal enhancement of the cervical spine.  Grade 1 C4-5 anterolisthesis, grade 1 C5-6 anterolisthesis, grade 1 C7 anterolisthesis without spondylolysis.  Degenerative change of the cervical spine results in mild canal stenosis at C5-6 and C6-7. Neural foraminal narrowing at all cervical levels: Severe at C5-6 and C6-7.   Electronically Signed   By: Elon Alas   On: 09/28/2014 04:38    Medications:  Scheduled: . amLODipine  5 mg Oral Daily  . aspirin EC  81 mg Oral Daily  . enoxaparin (LOVENOX) injection  40 mg Subcutaneous Q24H  . metoprolol tartrate  12.5 mg Oral BID    Assessment/Plan: 81 YO male with chronic shuffled gait, difficulty walking in the dark and chronic decreased sensation in LE now presenting to the ED with increased gait difficulty. MRI brain and C spine shows no acute abnormalities. Exam is consistent with bilateral LE peripheral neuropathy, wide based gait and shuffled steps. Etiology likely multifactorial in setting of chronic instability from peripheral neuropathy and cervical disc disease. Symptoms improved with hydration --component of dehydration and possible UTI likely contributing to gait issues.Patient has excessive bilateral cerumen buildup which may be contributing to his symptoms too.  Recommend: 1) Rehab consultation 2) SPEP and B12 pending 3) Outpatient neurology follow up for possible EMG/NCS    LOS: 1 day   Jim Like, DO [facility] [phone]  If 7pm- 7am, please page neurology on call as listed in [place]. 09/28/2014  9:15 AM

## 2014-09-28 NOTE — Evaluation (Signed)
Occupational Therapy Evaluation Patient Details Name: [name] MRN: [id number] DOB: Dec 02, 1923 Today's Date: 09/28/2014    History of Present Illness pt presents with dizziness and unsteady gait.     Clinical Impression   PTA pt lived at home and reports independence with ADLs. Pt currently at Mod I level with mild unsteadiness and likely at or near baseline. Pt will have family assist at home. Educated on fall prevention and safety with ADLs. No further acute OT needs.     Follow Up Recommendations  No OT follow up;Supervision/Assistance - 24 hour    Equipment Recommendations  None recommended by OT    Recommendations for Other Services       Precautions / Restrictions Precautions Precautions: Fall Restrictions Weight Bearing Restrictions: No      Mobility Bed Mobility Overal bed mobility: Modified Independent                Transfers Overall transfer level: Modified independent Equipment used: None                  Balance Overall balance assessment: Needs assistance Sitting-balance support: No upper extremity supported;Feet supported Sitting balance-Leahy Scale: Good     Standing balance support: No upper extremity supported Standing balance-Leahy Scale: Fair                              ADL Overall ADL's : Modified independent                                       General ADL Comments: Pt over all Mod I level for ADLs. Likely at baseline. Pt will have family assistance at home.      Vision Vision Assessment?: No apparent visual deficits          Pertinent Vitals/Pain Pain Assessment: No/denies pain     Hand Dominance Right   Extremity/Trunk Assessment Upper Extremity Assessment Upper Extremity Assessment: Overall WFL for tasks assessed   Lower Extremity Assessment Lower Extremity Assessment: Overall WFL for tasks assessed   Cervical / Trunk Assessment Cervical / Trunk Assessment:  Kyphotic   Communication Communication Communication: No difficulties   Cognition Arousal/Alertness: Awake/alert Behavior During Therapy: WFL for tasks assessed/performed Overall Cognitive Status: Within Functional Limits for tasks assessed                                Home Living Family/patient expects to be discharged to:: Private residence Living Arrangements: Spouse/significant other Available Help at Discharge: Family;Available 24 hours/day Type of Home: House Home Access: Ramped entrance     Home Layout: One level     Bathroom Shower/Tub: Occupational psychologist: Standard     Home Equipment: Cane - single point;Shower seat   Additional Comments: pt reports he does not use shower seat      Prior Functioning/Environment Level of Independence: Independent with assistive device(s) (Uses cane outside)             OT Diagnosis: Generalized weakness;Other (comment) (dizziness)    End of Session Nurse Communication: Other (comment) (pt has no OT needs)  Activity Tolerance: Patient tolerated treatment well Patient left: with call bell/phone within reach;Other (comment) (sitting EOB awaiting DC)   Time: [id number] OT Time Calculation (min): 10 min Charges:  OT  General Charges $OT Visit: 1 Procedure OT Evaluation $Initial OT Evaluation Tier I: 1 Procedure G-Codes:    Juluis Rainier 10-19-2014, 12:39 PM  Secundino Ginger Lynetta Mare, OTR/L Occupational Therapist [phone] (pager)

## 2014-09-28 NOTE — Progress Notes (Signed)
Up to scale and for orthos to be done by nurse tech and myself at the beside, was unsteady but with help of nurse tech was able to walk to scale and chair. Says he "feels much better than yesterday."

## 2014-09-29 ENCOUNTER — Telehealth: Payer: Self-pay | Admitting: Internal Medicine

## 2014-09-29 LAB — VITAMIN B12: VITAMIN B 12: 521 pg/mL (ref 211–911)

## 2014-09-29 MED ORDER — CEPHALEXIN 500 MG PO CAPS: 500.0000 mg | ORAL_CAPSULE | Freq: Two times a day (BID) | ORAL | Status: AC

## 2014-09-29 NOTE — Progress Notes (Signed)
Patient ID: [name], male   DOB: Feb 11, 1924, 81 y.o.   MRN: [id number] Attending physician discharge note: This pleasant 81 year old man with known Parkinson's was admitted for 24-hour observation for further evaluation of transient ataxia at home. Please see my attending admission note for full details. He did admit to urinary frequency on review of systems. Urinalysis was normal except for microscopic hematuria. A culture was sent and returned after the patient's discharge showing greater than 100,000 colonies gram-negative rods. I reviewed this with resident physician Dr. Joni Reining and the patient will be contacted and a prescription for a 7 day course cefazolin will be called to his pharmacy. He did receive 1 dose of ceftriaxone in the emergency department on admission.

## 2014-09-29 NOTE — Care Management Note (Signed)
    Page 1 of 1   09/29/2014     10:05:50 AM CARE MANAGEMENT NOTE 09/29/2014  Patient:  [name], [name]   Account Number:  [id number]  Date Initiated:  09/29/2014  Documentation initiated by:  [facility]  Subjective/Objective Assessment:   adm: felt off balance     Action/Plan:   discharge planning   Anticipated DC Date:  09/28/2014   Anticipated DC Plan:  [place]  CM consult      [facility]  HOME HEALTH   Choice offered to / List presented to:  C-1 Patient        [place] arranged  [place] - 11 Patient Refused      Status of service:   Medicare Important Message given?   (If response is "NO", the following Medicare IM given date fields will be blank) Date Medicare IM given:   Medicare IM given by:   Date Additional Medicare IM given:   Additional Medicare IM given by:    Discharge Disposition:  HOME/SELF CARE  Per UR Regulation:    If discussed at Long Length of Stay Meetings, dates discussed:    Comments:  09/29/14 10:00 Cm made follow up call to pt and pt's family [phone] (Home), to see if they wanted HHPT bc they had refused while in hospital.  Family still refuses stating they will follow up with ???MD if they changed their mind. No other CM needs were communicated.  Mariane Masters, BSN, CM [phone].

## 2014-09-29 NOTE — Telephone Encounter (Signed)
  Reason for call:   I placed an outgoing call to Mr. BREYDEN DOMER at 10:20  AM regarding his Urine culture results which came back positive for GNR >100,000 colonies.   Assessment/ Plan:   Patient was initially started on Tx for empiric UTI with some increased urinary frequency however this was discontinued as his U/A results were not very convincing.  He did however now grow GNR and I discussed this result with my attending Dr. Beryle Beams.    I instructed patient that I will prescribe a 7 day course of Keflex for him to take.  Will follow up Urine sensitivities.   Lucious Groves, DO   09/29/2014, 10:20 AM

## 2014-10-01 LAB — URINE CULTURE

## 2014-10-01 LAB — PROTEIN ELECTROPHORESIS, SERUM
Albumin ELP: 3.9 g/dL — ABNORMAL LOW (ref 3.8–4.8)
Alpha-1-Globulin: 0.2 g/dL — ABNORMAL LOW (ref 0.2–0.3)
Alpha-2-Globulin: 0.7 g/dL — ABNORMAL LOW (ref 0.5–0.9)
BETA GLOBULIN: 0.4 g/dL — AB (ref 0.4–0.6)
Beta 2: 0.2 g/dL — ABNORMAL LOW (ref 0.2–0.5)
Gamma Globulin: 0.9 g/dL — ABNORMAL LOW (ref 0.8–1.7)
M-Spike, %: NOT DETECTED g/dL
TOTAL PROTEIN ELP: 6.4 g/dL (ref 6.1–8.1)

## 2014-10-07 NOTE — Progress Notes (Signed)
UR Completed.  [phone]  

## 2014-11-11 ENCOUNTER — Telehealth: Payer: Self-pay | Admitting: Cardiovascular Disease

## 2014-11-11 NOTE — Telephone Encounter (Signed)
Stent was placed in 2009. Cath lab does not have replacement stent card.  I spoke with pt's son and gave him this information. I told him pt was given card at time of stent placement.  He will check with pt.  Pt is to have MRI by [facility] and they have requested stent information.  I told pt's son I could fax 2009 cath lab report to [facility] if he would like.  Son gave permission to fax report.  I confirmed fax number with Percell Miller Wainer--[phone] and faxed report

## 2014-11-11 NOTE — Telephone Encounter (Signed)
New problem   Pt's son calling needing a stent card b/c pt is needing to have a MRI. Please call pt's son.

## 2014-11-12 ENCOUNTER — Telehealth: Payer: Self-pay | Admitting: Cardiovascular Disease

## 2014-11-12 NOTE — Telephone Encounter (Signed)
New MEssage  Pt son calling to follow up on paperwork being faxed to Doree Fudge about possible MRI. Please call back and disuss.

## 2014-11-12 NOTE — Telephone Encounter (Signed)
Called patient's son [name]) and he advised that Langley Gauss at Dr.Wainer's office did not receive the stent information and has a question. [name] is asking [place] to call Langley Gauss tomorrow at [phone] to find out what else she needs. Patient's son states that his orthopod is Dr.Dumonski at [facility] but he is having the MRI at [facility]/Wainer's office on 5/7.

## 2014-11-13 NOTE — Telephone Encounter (Signed)
Called Langley Gauss at Dr. Noemi Chapel office.  Apparently per son she did not receive the faxed cath report that was sent on Monday.  Will refax cath report done 03/21/08 by Dr. Lia Foyer to [phone] att: Langley Gauss. Per Dr. Angelena Form is OK to perform MRI with stent.

## 2014-12-09 ENCOUNTER — Other Ambulatory Visit: Payer: Self-pay | Admitting: Cardiovascular Disease

## 2014-12-22 ENCOUNTER — Other Ambulatory Visit: Payer: Self-pay | Admitting: Cardiovascular Disease

## 2015-01-03 ENCOUNTER — Ambulatory Visit (INDEPENDENT_AMBULATORY_CARE_PROVIDER_SITE_OTHER): Payer: Medicare Other | Admitting: Cardiovascular Disease

## 2015-01-03 ENCOUNTER — Encounter: Payer: Self-pay | Admitting: Cardiovascular Disease

## 2015-01-03 VITALS — BP 110/68 | HR 68 | Ht 71.0 in | Wt 158.2 lb

## 2015-01-03 DIAGNOSIS — E785 Hyperlipidemia, unspecified: Secondary | ICD-10-CM | POA: Diagnosis not present

## 2015-01-03 DIAGNOSIS — I2510 Atherosclerotic heart disease of native coronary artery without angina pectoris: Secondary | ICD-10-CM

## 2015-01-03 NOTE — Patient Instructions (Signed)
Medication Instructions:  Your physician recommends that you continue on your current medications as directed. Please refer to the Current Medication list given to you today.   Labwork: none  Testing/Procedures: none  Follow-Up: Your physician wants you to follow-up in: 6 months.  You will receive a reminder letter in the mail two months in advance. If you don't receive a letter, please call our office to schedule the follow-up appointment.       

## 2015-01-03 NOTE — Progress Notes (Signed)
Chief Complaint  Patient presents with  . Follow-up     History of Present Illness: 81 yo male with history of CAD, HLD, atrial flutter who is here today for cardiac follow up. He has been followed in the past by Dr. Lia Foyer. I met him for the first time in 03/07/14. His CAD has been stable. Last cath 2009. Abnormal EKG in July 2013 concerning for atrial flutter. He was seen in the EP clinic by Dr. Rayann Heman and event monitor showed sinus brady only with no pauses. Not felt to be a candidate for long term anti-coagulation secondary to hematuria. He has not tolerated statins in the past. DVT in July 2015 and started on Xarelto with plan to stop in November 2015. Xarelto stopped at last visit.   He is here today for follow up. He tells me that he has been feeling well. No chest pain or SOB. No palpitations. Tolerating all meds.   Primary Care Physician: Nelda Bucks   Last Lipid Profile: Followed in primary care.   Past Medical History  Diagnosis Date  . Myocardial infarct 03/21/2008  . Coronary atherosclerosis of native coronary artery     a. Anterior STEMI 2009 s/p BMSx2 to mid & prox LAD and angioplasty to distal LAD. total RCA with collaterals, moderate Cx plaquing. a. EF 55-60% in 2013.  . Pure hypercholesterolemia     a. Has not tolerated statins in the past.  . Phlebitis and thrombophlebitis of superficial vessels of lower extremities   . Gross hematuria   . Paroxysmal atrial flutter     a. Abnl EKG 01/2012 concerning for atrial flutter, event monitor showed sinus bradycardia only and no pauses. Not felt to be a candidate for longterm anticoag due to hematuria.  . Sinus bradycardia     a. By prior event monitor.  [name] NSVT (nonsustained ventricular tachycardia)     a. Per DC summary from time of STEMI 2009.  [name] HTN (hypertension)   . Right leg DVT     a. Dx 01/2014.  [name] Thrombocytopenia     Past Surgical History  Procedure Laterality Date  . Replacement total knee bilateral      . Carpal tunnel release    . Trigger finger surgery    . Coronary stent placement      Current Outpatient Prescriptions  Medication Sig Dispense Refill  . amLODipine (NORVASC) 5 MG tablet TAKE 1 TABLET (5 MG TOTAL) BY MOUTH DAILY. 30 tablet 0  . aspirin EC 81 MG tablet Take 1 tablet (81 mg total) by mouth daily. 90 tablet 3  . Azelastine HCl 0.15 % SOLN USE 2 SPRAYS NASALLY DAILY TO EACH NOSTRIL  5  . celecoxib (CELEBREX) 200 MG capsule Take 200 mg by mouth daily.    . metoprolol tartrate (LOPRESSOR) 25 MG tablet TAKE 1/2 TABLET BY MOUTH 2 TIMES DAILY 30 tablet 5  . nitroGLYCERIN (NITROSTAT) 0.4 MG SL tablet Place 0.4 mg under the tongue every 5 (five) minutes as needed for chest pain.    . Red Yeast Rice Extract (RED YEAST RICE PO) Take 1,200 mg by mouth daily.     No current facility-administered medications for this visit.    Allergies  Allergen Reactions  . Rosuvastatin Other (See Comments)    Aches    History   Social History  . Marital Status: Married    Spouse Name: N/A  . Number of Children: N/A  . Years of Education: N/A   Occupational History  .  Not on file.   Social History Main Topics  . Smoking status: Never Smoker   . Smokeless tobacco: Never Used  . Alcohol Use: No  . Drug Use: No  . Sexual Activity: Not on file   Other Topics Concern  . Not on file   Social History Narrative   Lives in [place] w/ wife. 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 Denies tobacco, alcohol, or drug use ever in lifetime.    Family History  Problem Relation Age of Onset  . Cancer Sister   . Cancer Sister     Review of Systems:  As stated in the HPI and otherwise negative.   BP 110/68 mmHg  Pulse 68  Ht 5' 11"  (1.803 m)  Wt 158 lb 3.2 oz (71.759 kg)  BMI 22.07 kg/m2  SpO2 95%  Physical Examination: General: Well developed, well nourished, NAD HEENT: OP clear, mucus membranes moist SKIN: warm, dry. No rashes. Neuro: No focal  deficits Musculoskeletal: Muscle strength 5/5 all ext Psychiatric: Mood and affect normal Neck: No JVD, no carotid bruits, no thyromegaly, no lymphadenopathy. Lungs:Clear bilaterally, no wheezes, rhonci, crackles Cardiovascular: Regular rate and rhythm. No murmurs, gallops or rubs. Abdomen:Soft. Bowel sounds present. Non-tender.  Extremities: No lower extremity edema. Pulses are 2 + in the bilateral DP/PT.  Cardiac cath 03/21/08:  1. Ventriculography in the RAO projection reveals hypo-to-akinesis of  the anteroapical segment. The ejection fraction estimate will be  45%.  2. The left main is free of critical disease.  3. The LAD has a 60% narrowing at the septal. There is some mild  plaquing distal to this and then just after another septal, there  is a 90% narrowing, then total occlusion after a small diagonal.  The extent of disease extends into just beyond the next diagonal.  The proximal lesion was covered with a 2.5-mm stent with reduction  from 60%-0%. The mid LAD, which was the site of total occlusion  was reduced from 90% and 100% down to 0%. The distal lesion was  80%-90% in the distal vessel and reduced from 80%-90% to less than  20% residual narrowing with smooth angiographic result and good  runoff into the distal vessel.  4. The circumflex provides a large marginal branch with diffuse  segmental 60% narrowing followed by a large marginal branch. There  was a tiny subbranch with 90% narrowing and an inferior subbranch  with perhaps 60% narrowing.  5. The right coronary artery is segmentally plaqued in the midvessel  and then totally occluded after an RV branch. This RV branch then  collateralizes the distal vessel. The distal vessel was supplied  by the LAD septals.  EKG:  EKG is not ordered today. The ekg ordered today demonstrates   Recent Labs: 09/27/2014: ALT 20; BUN 27*; Creatinine, Ser 1.19; Hemoglobin 14.3; Platelets 100*; Potassium 4.2; Sodium 140 09/28/2014: TSH  2.578     Wt Readings from Last 3 Encounters:  01/03/15 158 lb 3.2 oz (71.759 kg)  09/28/14 152 lb (68.947 kg)  06/03/14 160 lb 3.2 oz (72.666 kg)     Other studies Reviewed: Additional studies/ records that were reviewed today include: . Review of the above records demonstrates:    Assessment and Plan:   1. CAD: Stable. No changes. Will continue ASA 81 mg daily. Continue beta blocker.    2. Atrial arrythmia: No palpitations. Sinus today. Continue beta blocker.   3. Hyperlipidemia: Followed in primary care. He has not tolerated statins.  4. DVT: He is now off of Xarelto. No recurrence of DVT  Current medicines are reviewed at length with the patient today.  The patient does not have concerns regarding medicines.  The following changes have been made:  no change  Labs/ tests ordered today include:  No orders of the defined types were placed in this encounter.    Disposition:   FU with me in 6 months  Signed, Lauree Chandler, MD 01/03/2015 3:23 PM    [facility] [place], [place], [place]  [address] Phone: [phone]; Fax: [phone]

## 2015-01-08 ENCOUNTER — Other Ambulatory Visit: Payer: Self-pay | Admitting: Cardiovascular Disease

## 2015-03-09 ENCOUNTER — Emergency Department (HOSPITAL_COMMUNITY): Payer: Medicare Other

## 2015-03-09 ENCOUNTER — Emergency Department (HOSPITAL_COMMUNITY)
Admission: EM | Admit: 2015-03-09 | Discharge: 2015-03-09 | Disposition: A | Discharge status: Home / Self Care | Payer: Medicare Other | Attending: Emergency Medicine | Admitting: Emergency Medicine

## 2015-03-09 ENCOUNTER — Encounter (HOSPITAL_COMMUNITY): Payer: Self-pay | Admitting: Emergency Medicine

## 2015-03-09 DIAGNOSIS — I10 Essential (primary) hypertension: Secondary | ICD-10-CM | POA: Diagnosis not present

## 2015-03-09 DIAGNOSIS — Z862 Personal history of diseases of the blood and blood-forming organs and certain disorders involving the immune mechanism: Secondary | ICD-10-CM | POA: Insufficient documentation

## 2015-03-09 DIAGNOSIS — Z7982 Long term (current) use of aspirin: Secondary | ICD-10-CM | POA: Insufficient documentation

## 2015-03-09 DIAGNOSIS — Z79899 Other long term (current) drug therapy: Secondary | ICD-10-CM | POA: Diagnosis not present

## 2015-03-09 DIAGNOSIS — Z86718 Personal history of other venous thrombosis and embolism: Secondary | ICD-10-CM | POA: Insufficient documentation

## 2015-03-09 DIAGNOSIS — R269 Unspecified abnormalities of gait and mobility: Secondary | ICD-10-CM | POA: Insufficient documentation

## 2015-03-09 DIAGNOSIS — I255 Ischemic cardiomyopathy: Secondary | ICD-10-CM | POA: Insufficient documentation

## 2015-03-09 LAB — BASIC METABOLIC PANEL
Anion gap: 8 (ref 5–15)
BUN: 22 mg/dL — AB (ref 6–20)
CO2: 24 mmol/L (ref 22–32)
Calcium: 9.3 mg/dL (ref 8.9–10.3)
Chloride: 107 mmol/L (ref 101–111)
Creatinine, Ser: 1.09 mg/dL (ref 0.61–1.24)
GFR calc Af Amer: >60 mL/min (ref >60)
GFR calc non Af Amer: 59 mL/min — ABNORMAL LOW (ref >60)
Glucose, Bld: 94 mg/dL (ref 65–99)
Potassium: 4.2 mmol/L (ref 3.5–5.1)
Sodium: 139 mmol/L (ref 135–145)

## 2015-03-09 LAB — CBC WITH DIFFERENTIAL/PLATELET
Basophils Absolute: 0 10*3/uL (ref 0.0–0.1)
Basophils Relative: 1 % (ref 0–1)
Eosinophils Absolute: 0.2 10*3/uL (ref 0.0–0.7)
Eosinophils Relative: 4 % (ref 0–5)
HCT: 41 % (ref 39.0–52.0)
Hemoglobin: 13.8 g/dL (ref 13.0–17.0)
Lymphocytes Relative: 29 % (ref 12–46)
Lymphs Abs: 1.3 10*3/uL (ref 0.7–4.0)
MCH: 29.1 pg (ref 26.0–34.0)
MCHC: 33.7 g/dL (ref 30.0–36.0)
MCV: 86.3 fL (ref 78.0–100.0)
Monocytes Absolute: 0.4 10*3/uL (ref 0.1–1.0)
Monocytes Relative: 8 % (ref 3–12)
Neutro Abs: 2.7 10*3/uL (ref 1.7–7.7)
Neutrophils Relative %: 59 % (ref 43–77)
Platelets: 118 10*3/uL — ABNORMAL LOW (ref 150–400)
RBC: 4.75 MIL/uL (ref 4.22–5.81)
RDW: 13.7 % (ref 11.5–15.5)
WBC: 4.6 10*3/uL (ref 4.0–10.5)

## 2015-03-09 LAB — URINALYSIS, ROUTINE W REFLEX MICROSCOPIC
Bilirubin Urine: NEGATIVE
GLUCOSE, UA: NEGATIVE mg/dL
Hgb urine dipstick: NEGATIVE
Ketones, ur: NEGATIVE mg/dL
Leukocytes, UA: NEGATIVE
Nitrite: NEGATIVE
Protein, ur: NEGATIVE mg/dL
Specific Gravity, Urine: 1.011 (ref 1.005–1.030)
Urobilinogen, UA: 0.2 mg/dL (ref 0.0–1.0)
pH: 6.5 (ref 5.0–8.0)

## 2015-03-09 MED ORDER — SODIUM CHLORIDE 0.9 % IV BOLUS (SEPSIS)
500.0000 mL | Freq: Once | INTRAVENOUS | Status: AC
  Administered 2015-03-09: 500 mL via INTRAVENOUS

## 2015-03-09 NOTE — ED Notes (Signed)
Pt returns from ct scan. 

## 2015-03-09 NOTE — ED Provider Notes (Signed)
CSN: [id number]     Arrival date & time 03/09/15  0747 History   First MD Initiated Contact with Patient 03/09/15 [phone]     Chief Complaint  Patient presents with  . Gait Problem     (Consider location/radiation/quality/duration/timing/severity/associated sxs/prior Treatment) HPI  Patient presents after an episode of lightheadedness, gait difficulty. Symptoms began when he got out of bed. Symptoms were mild, persistent, with no complete syncope, no fall, but with difficulty walking. No pain that was new during that timeframe, patient acknowledges a history of ongoing episodic low back pain. Patient states that he has been generally well. No recent health changes, medication changes. He does state that his urine may be more dark than usual.  Patient acknowledges one prior similar episode about 6 months ago, requiring hospitalization.  Past Medical History  Diagnosis Date  . Myocardial infarct 03/21/2008  . Coronary atherosclerosis of native coronary artery     a. Anterior STEMI 2009 s/p BMSx2 to mid & prox LAD and angioplasty to distal LAD. total RCA with collaterals, moderate Cx plaquing. a. EF 55-60% in 2013.  . Pure hypercholesterolemia     a. Has not tolerated statins in the past.  . Phlebitis and thrombophlebitis of superficial vessels of lower extremities   . Gross hematuria   . Paroxysmal atrial flutter     a. Abnl EKG 01/2012 concerning for atrial flutter, event monitor showed sinus bradycardia only and no pauses. Not felt to be a candidate for longterm anticoag due to hematuria.  . Sinus bradycardia     a. By prior event monitor.  [name] NSVT (nonsustained ventricular tachycardia)     a. Per DC summary from time of STEMI 2009.  [name] HTN (hypertension)   . Right leg DVT     a. Dx 01/2014.  [name] Thrombocytopenia    Past Surgical History  Procedure Laterality Date  . Replacement total knee bilateral    . Carpal tunnel release    . Trigger finger surgery    . Coronary stent  placement     Family History  Problem Relation Age of Onset  . Cancer Sister   . Cancer Sister    Social History  Substance Use Topics  . Smoking status: Never Smoker   . Smokeless tobacco: Never Used  . Alcohol Use: No    Review of Systems  Constitutional:       Per HPI, otherwise negative  HENT:       Per HPI, otherwise negative  Respiratory:       Per HPI, otherwise negative  Cardiovascular:       Per HPI, otherwise negative  Gastrointestinal: Negative for vomiting.  Endocrine:       Negative aside from HPI  Genitourinary:       Neg aside from HPI   Musculoskeletal:       Per HPI, otherwise negative  Skin: Negative.   Neurological: Positive for dizziness and light-headedness. Negative for syncope.      Allergies  Rosuvastatin  Home Medications   Prior to Admission medications   Medication Sig Start Date End Date Taking? Authorizing Provider  amLODipine (NORVASC) 5 MG tablet TAKE 1 TABLET BY MOUTH DAILY 01/09/15  Yes Burnell Blanks, MD  aspirin EC 81 MG tablet Take 1 tablet (81 mg total) by mouth daily. 06/03/14  Yes Burnell Blanks, MD  Azelastine HCl 0.15 % SOLN USE 2 SPRAYS NASALLY DAILY TO EACH NOSTRIL 12/24/14  Yes Historical Provider, MD  celecoxib (CELEBREX) 200  MG capsule Take 200 mg by mouth daily.   Yes Historical Provider, MD  HYDROcodone-acetaminophen (NORCO/VICODIN) 5-325 MG per tablet Take 1 tablet by mouth every 6 (six) hours as needed for moderate pain.   Yes Historical Provider, MD  methocarbamol (ROBAXIN) 500 MG tablet Take 500 mg by mouth 3 (three) times daily.   Yes Historical Provider, MD  metoprolol tartrate (LOPRESSOR) 25 MG tablet TAKE 1/2 TABLET BY MOUTH 2 TIMES DAILY 12/23/14  Yes Burnell Blanks, MD  nitroGLYCERIN (NITROSTAT) 0.4 MG SL tablet Place 0.4 mg under the tongue every 5 (five) minutes as needed for chest pain.   Yes Historical Provider, MD  pregabalin (LYRICA) 75 MG capsule Take 75 mg by mouth 2 (two) times  daily.   Yes Historical Provider, MD  Red Yeast Rice Extract (RED YEAST RICE PO) Take 1,200 mg by mouth daily.   Yes Historical Provider, MD   BP 164/68 mmHg  Pulse 60  Resp 20  Ht 5\' 11"  (1.803 m)  Wt 160 lb (72.576 kg)  BMI 22.33 kg/m2  SpO2 95% Physical Exam  Constitutional: He is oriented to person, place, and time. He appears well-developed. No distress.  HENT:  Head: Normocephalic and atraumatic.  Eyes: Conjunctivae and EOM are normal.  Cardiovascular: Normal rate and regular rhythm.   Pulmonary/Chest: Effort normal. No stridor. No respiratory distress.  Abdominal: He exhibits no distension.  Musculoskeletal: He exhibits no edema.  Neurological: He is alert and oriented to person, place, and time. No cranial nerve deficit. He exhibits normal muscle tone. Coordination normal.  Patient has mild gait instability on initial evaluation  Skin: Skin is warm and dry.  Psychiatric: He has a normal mood and affect.  Nursing note and vitals reviewed.   ED Course  Procedures (including critical care time) Labs Review Labs Reviewed  BASIC METABOLIC PANEL - Abnormal; Notable for the following:    BUN 22 (*)    GFR calc non Af Amer 59 (*)    All other components within normal limits  CBC WITH DIFFERENTIAL/PLATELET - Abnormal; Notable for the following:    Platelets 118 (*)    All other components within normal limits  URINALYSIS, ROUTINE W REFLEX MICROSCOPIC (NOT AT [facility])     Chart review demonstrates similar presentation 6 months ago, an admission during which the patient had MRI, that was normal. Evidence for urinary tract infection at that time.  Patient also has recent cardiology notes with statement that the patient is no longer a candidate for additional anticoagulation given consultations. Patient is currently taking aspirin.    Imaging Review Ct Head Wo Contrast  03/09/2015   CLINICAL DATA:  Unsteady gait since this morning at 5 a.m. Dizziness.  EXAM: CT HEAD WITHOUT  CONTRAST  TECHNIQUE: Contiguous axial images were obtained from the base of the skull through the vertex without intravenous contrast.  COMPARISON:  09/27/2014 brain MR. head CT of 11/18/2009.  FINDINGS: Sinuses/Soft tissues: Clear paranasal sinuses and mastoid air cells.  Intracranial: Mild low density in the periventricular white matter likely related to small vessel disease. Bilateral vertebral artery atherosclerosis. Left basal ganglia subtle remote lacunar infarct on image 17, similar. Areas of encephalomalacia in the right frontal and left occipital lobes are similar to on the prior MRI. No mass lesion, hemorrhage, hydrocephalus, acute infarct, intra-axial, or extra-axial fluid collection.  IMPRESSION: 1.  No acute intracranial abnormality. 2. Small vessel ischemic change with areas of remote insult as detailed above.   Electronically Signed  By: Abigail Miyamoto M.D.   On: 03/09/2015 09:06   I have personally reviewed and evaluated these images and lab results as part of my medical decision-making.   EKG Interpretation   Date/Time:  Sunday March 09 2015 07:57:53 EDT Ventricular Rate:  55 PR Interval:  204 QRS Duration: 114 QT Interval:  431 QTC Calculation: 412 R Axis:   -98 Text Interpretation:  Sinus rhythm Left anterior fascicular block Anterior  infarct, old Sinus rhythm Left anterior fasicular block Artifact No  significant change since last tracing Abnormal ekg Reconfirmed by  Carmin Muskrat  MD [phone]) on 03/09/2015 8:21:44 AM     On repeat exam the patient is in no distress, he and his son are aware of all results. Patient states that about 4 hours before awakening for good, when he developed his symptoms, he took a muscle relaxant.  Vital signs stable, patient has no gait abnormalities on repeat check  MDM   Final diagnoses:  Gait difficulty   patient presents after one episode of gait difficulty, lightheadedness. Patient's eventual acknowledgment of using muscle relaxant  about 4 hours prior to his symptoms suggest possible etiology. Patient is also borderline hypotensive with orthostatic check initially. Labs, CT scan reassuring, and with complete resolution of symptoms, patient was appropriate for discharge with outpatient follow-up.   Carmin Muskrat, MD 03/09/15 [phone]

## 2015-03-09 NOTE — Discharge Instructions (Signed)
As discussed, your evaluation today has been largely reassuring.  But, it is important that you monitor your condition carefully, and do not hesitate to return to the ED if you develop new, or concerning changes in your condition. ? ?Otherwise, please follow-up with your physician for appropriate ongoing care. ? ?

## 2015-03-09 NOTE — ED Notes (Signed)
Pt reports when he woke up at 0530 today his balance felt off. Family reports, that pt has also been complaining that his urine looked red and that he felt like he had to urinate more than what came out. When pt stood up from wheelchair to get to stretcher he said his balance is fine.

## 2015-03-13 ENCOUNTER — Emergency Department (HOSPITAL_COMMUNITY)
Admission: EM | Admit: 2015-03-13 | Discharge: 2015-03-13 | Disposition: A | Discharge status: Home / Self Care | Payer: Medicare Other | Attending: Emergency Medicine | Admitting: Emergency Medicine

## 2015-03-13 ENCOUNTER — Encounter (HOSPITAL_COMMUNITY): Payer: Self-pay | Admitting: Emergency Medicine

## 2015-03-13 DIAGNOSIS — I252 Old myocardial infarction: Secondary | ICD-10-CM | POA: Insufficient documentation

## 2015-03-13 DIAGNOSIS — I10 Essential (primary) hypertension: Secondary | ICD-10-CM | POA: Insufficient documentation

## 2015-03-13 DIAGNOSIS — I2510 Atherosclerotic heart disease of native coronary artery without angina pectoris: Secondary | ICD-10-CM | POA: Insufficient documentation

## 2015-03-13 DIAGNOSIS — Z8639 Personal history of other endocrine, nutritional and metabolic disease: Secondary | ICD-10-CM | POA: Insufficient documentation

## 2015-03-13 DIAGNOSIS — Z79899 Other long term (current) drug therapy: Secondary | ICD-10-CM | POA: Insufficient documentation

## 2015-03-13 DIAGNOSIS — Z7982 Long term (current) use of aspirin: Secondary | ICD-10-CM | POA: Diagnosis not present

## 2015-03-13 DIAGNOSIS — Z862 Personal history of diseases of the blood and blood-forming organs and certain disorders involving the immune mechanism: Secondary | ICD-10-CM | POA: Diagnosis not present

## 2015-03-13 DIAGNOSIS — R2681 Unsteadiness on feet: Secondary | ICD-10-CM

## 2015-03-13 DIAGNOSIS — Z86718 Personal history of other venous thrombosis and embolism: Secondary | ICD-10-CM | POA: Diagnosis not present

## 2015-03-13 DIAGNOSIS — R269 Unspecified abnormalities of gait and mobility: Secondary | ICD-10-CM | POA: Diagnosis not present

## 2015-03-13 DIAGNOSIS — R42 Dizziness and giddiness: Secondary | ICD-10-CM | POA: Diagnosis present

## 2015-03-13 LAB — URINALYSIS, ROUTINE W REFLEX MICROSCOPIC
Bilirubin Urine: NEGATIVE
Glucose, UA: NEGATIVE mg/dL
Hgb urine dipstick: NEGATIVE
Ketones, ur: NEGATIVE mg/dL
LEUKOCYTES UA: NEGATIVE
NITRITE: NEGATIVE
PH: 6 (ref 5.0–8.0)
Protein, ur: NEGATIVE mg/dL
SPECIFIC GRAVITY, URINE: 1.007 (ref 1.005–1.030)
Urobilinogen, UA: 0.2 mg/dL (ref 0.0–1.0)

## 2015-03-13 LAB — BASIC METABOLIC PANEL
ANION GAP: 4 — AB (ref 5–15)
BUN: 20 mg/dL (ref 6–20)
CO2: 29 mmol/L (ref 22–32)
Calcium: 9.2 mg/dL (ref 8.9–10.3)
Chloride: 106 mmol/L (ref 101–111)
Creatinine, Ser: 1.1 mg/dL (ref 0.61–1.24)
GFR calc Af Amer: >60 mL/min (ref >60)
GFR calc non Af Amer: 58 mL/min — ABNORMAL LOW (ref >60)
GLUCOSE: 94 mg/dL (ref 65–99)
POTASSIUM: 4.3 mmol/L (ref 3.5–5.1)
Sodium: 139 mmol/L (ref 135–145)

## 2015-03-13 LAB — CBC WITH DIFFERENTIAL/PLATELET
BASOS ABS: 0 10*3/uL (ref 0.0–0.1)
Basophils Relative: 0 % (ref 0–1)
Eosinophils Absolute: 0.1 10*3/uL (ref 0.0–0.7)
Eosinophils Relative: 3 % (ref 0–5)
HEMATOCRIT: 40.9 % (ref 39.0–52.0)
Hemoglobin: 13.5 g/dL (ref 13.0–17.0)
LYMPHS ABS: 1.2 10*3/uL (ref 0.7–4.0)
LYMPHS PCT: 24 % (ref 12–46)
MCH: 28.6 pg (ref 26.0–34.0)
MCHC: 33 g/dL (ref 30.0–36.0)
MCV: 86.7 fL (ref 78.0–100.0)
MONO ABS: 0.4 10*3/uL (ref 0.1–1.0)
Monocytes Relative: 8 % (ref 3–12)
NEUTROS ABS: 3.2 10*3/uL (ref 1.7–7.7)
Neutrophils Relative %: 64 % (ref 43–77)
Platelets: 110 10*3/uL — ABNORMAL LOW (ref 150–400)
RBC: 4.72 MIL/uL (ref 4.22–5.81)
RDW: 13.5 % (ref 11.5–15.5)
WBC: 4.9 10*3/uL (ref 4.0–10.5)

## 2015-03-13 NOTE — ED Notes (Signed)
Per pt: woke up normal, onset dizziness at 0600, was awake and normal prior.  Alert and oriented x 4.  VSS though somewhat hypertensive.  SB on the monitor.  No focal neural deficits noted on exam.

## 2015-03-13 NOTE — ED Provider Notes (Signed)
Medical screening examination/treatment/procedure(s) were conducted as a shared visit with non-physician practitioner(s) and myself.  I personally evaluated the patient during the encounter.   EKG Interpretation   Date/Time:  Thursday March 13 2015 08:29:50 EDT Ventricular Rate:  60 PR Interval:  193 QRS Duration: 118 QT Interval:  435 QTC Calculation: 435 R Axis:   -79 Text Interpretation:  Sinus rhythm Atrial premature complex Left anterior  fascicular block Anterior infarct, old No significant change since last  tracing Confirmed by Doloris Servantes  MD, Tajha Sammarco ([id number]) on 03/13/2015 8:58:45 AM      patient here with dizziness that began the morning after taking his muscle relaxant. Seen here for similar symptoms recently.  Exam is nonfocal. Do not into this represents a neurological issue but rather a medication intolerance. Patient instructed to follow-up with his physician to be prescribed a different muscle relaxant.  Lacretia Leigh, MD 03/13/15 1102

## 2015-03-13 NOTE — ED Notes (Signed)
Pt states that he is not having actual dizziness but states he has a new difficulty with balance and feels like he will fall over when standing or ambulating.  Yesterday patient had no difficulty with this.

## 2015-03-13 NOTE — Discharge Instructions (Signed)
-   Call your orthopedic doctor to discuss sensitivity to muscle relaxer - Return to ED if you have difficulty with vision, speech, walking or using your arms, chest pains, SOB, falls or further worsening of symptoms

## 2015-03-13 NOTE — ED Provider Notes (Signed)
CSN: [id number]     Arrival date & time 03/13/15  0820 History   First MD Initiated Contact with Patient 03/13/15 0830     Chief Complaint  Patient presents with  . Dizziness    HPI  [name] is an 81 year old male presenting today with feelings of instability while walking this morning. He reports waking up feeling normal and then acute onset of difficulty with balance about 30 minutes later. He did not fall at this time. He had taken a muscle relaxer this morning but none of his other medicines. Denies headache, vision changes, syncope, chest pain, shortness of breath, abdominal pain, nausea or vomiting. Denies new medication changes.  He was seen 4 days ago with similar story and symptoms. Discharged with instructions to follow up with PCP regarding sensitivity to muscle relaxer. Pt has not seen PCP for this yet.   Past Medical History  Diagnosis Date  . Myocardial infarct 03/21/2008  . Coronary atherosclerosis of native coronary artery     a. Anterior STEMI 2009 s/p BMSx2 to mid & prox LAD and angioplasty to distal LAD. total RCA with collaterals, moderate Cx plaquing. a. EF 55-60% in 2013.  . Pure hypercholesterolemia     a. Has not tolerated statins in the past.  . Phlebitis and thrombophlebitis of superficial vessels of lower extremities   . Gross hematuria   . Paroxysmal atrial flutter     a. Abnl EKG 01/2012 concerning for atrial flutter, event monitor showed sinus bradycardia only and no pauses. Not felt to be a candidate for longterm anticoag due to hematuria.  . Sinus bradycardia     a. By prior event monitor.  [name] NSVT (nonsustained ventricular tachycardia)     a. Per DC summary from time of STEMI 2009.  [name] HTN (hypertension)   . Right leg DVT     a. Dx 01/2014.  [name] Thrombocytopenia    Past Surgical History  Procedure Laterality Date  . Replacement total knee bilateral    . Carpal tunnel release    . Trigger finger surgery    . Coronary stent placement     Family  History  Problem Relation Age of Onset  . Cancer Sister   . Cancer Sister    Social History  Substance Use Topics  . Smoking status: Never Smoker   . Smokeless tobacco: Never Used  . Alcohol Use: No    Review of Systems  Constitutional: Negative for fever and chills.  Eyes: Negative for visual disturbance.  Respiratory: Negative for shortness of breath.   Cardiovascular: Negative for chest pain.  Gastrointestinal: Negative for nausea, vomiting and abdominal pain.  Musculoskeletal: Positive for gait problem.  Neurological: Positive for light-headedness. Negative for syncope, weakness and headaches.      Allergies  Rosuvastatin  Home Medications   Prior to Admission medications   Medication Sig Start Date End Date Taking? Authorizing Provider  amLODipine (NORVASC) 5 MG tablet TAKE 1 TABLET BY MOUTH DAILY 01/09/15  Yes Burnell Blanks, MD  aspirin EC 81 MG tablet Take 1 tablet (81 mg total) by mouth daily. 06/03/14  Yes Burnell Blanks, MD  Azelastine HCl 0.15 % SOLN USE 2 SPRAYS NASALLY DAILY TO EACH NOSTRIL 12/24/14  Yes Historical Provider, MD  celecoxib (CELEBREX) 200 MG capsule Take 200 mg by mouth daily.   Yes Historical Provider, MD  HYDROcodone-acetaminophen (NORCO/VICODIN) 5-325 MG per tablet Take 1 tablet by mouth every 6 (six) hours as needed for moderate pain.  Yes Historical Provider, MD  methocarbamol (ROBAXIN) 500 MG tablet Take 500 mg by mouth 3 (three) times daily.   Yes Historical Provider, MD  metoprolol tartrate (LOPRESSOR) 25 MG tablet TAKE 1/2 TABLET BY MOUTH 2 TIMES DAILY 12/23/14  Yes Burnell Blanks, MD  nitroGLYCERIN (NITROSTAT) 0.4 MG SL tablet Place 0.4 mg under the tongue every 5 (five) minutes as needed for chest pain.   Yes Historical Provider, MD  pregabalin (LYRICA) 75 MG capsule Take 75 mg by mouth 2 (two) times daily.   Yes Historical Provider, MD  Red Yeast Rice Extract (RED YEAST RICE PO) Take 1,200 mg by mouth daily.   Yes  Historical Provider, MD   BP 142/55 mmHg  Pulse 56  Temp(Src) 98.7 F (37.1 C) (Oral)  Resp 16  Ht 5\' 11"  (1.803 m)  Wt 160 lb (72.576 kg)  BMI 22.33 kg/m2  SpO2 96% Physical Exam  Constitutional: He is oriented to person, place, and time. He appears well-developed and well-nourished. No distress.  HENT:  Head: Normocephalic and atraumatic.  Eyes: Conjunctivae and EOM are normal. Pupils are equal, round, and reactive to light.  Neck: Normal range of motion.  Cardiovascular: Normal rate, regular rhythm and normal heart sounds.   Pulmonary/Chest: Effort normal and breath sounds normal. No respiratory distress. He has no wheezes. He has no rales.  Abdominal: Soft. He exhibits no distension. There is no tenderness.  Musculoskeletal: Normal range of motion.  Neurological: He is alert and oriented to person, place, and time. No cranial nerve deficit.  Cranial nerves 2-12 intact 5/5 grip and ankle strength.  Sensation intact to light touch over extremities Finger to nose normal Gait steady but slow with small steps.   Skin: Skin is warm and dry.  Psychiatric: He has a normal mood and affect. His behavior is normal.  Vitals reviewed.   ED Course  Procedures (including critical care time) Labs Review Labs Reviewed  CBC WITH DIFFERENTIAL/PLATELET - Abnormal; Notable for the following:    Platelets 110 (*)    All other components within normal limits  BASIC METABOLIC PANEL - Abnormal; Notable for the following:    GFR calc non Af Amer 58 (*)    Anion gap 4 (*)    All other components within normal limits  URINALYSIS, ROUTINE W REFLEX MICROSCOPIC (NOT AT [facility])    Imaging Review No results found. I have personally reviewed and evaluated these images and lab results as part of my medical decision-making.   EKG Interpretation   Date/Time:  Thursday March 13 2015 08:29:50 EDT Ventricular Rate:  60 PR Interval:  193 QRS Duration: 118 QT Interval:  435 QTC Calculation:  435 R Axis:   -79 Text Interpretation:  Sinus rhythm Atrial premature complex Left anterior  fascicular block Anterior infarct, old No significant change since last  tracing Confirmed by ALLEN  MD, ANTHONY ([id number]) on 03/13/2015 8:58:45 AM      MDM   Final diagnoses:  Gait instability   Pt presenting with gait instability of acute onset this morning. No falls or loss of consciousness. Pt reports taking a muscle relaxer this morning 30 minutes before symptom onset. No vision or speech changes. No weakness of the extremities. Pt with similar visit to ED 4 days ago after ingestion of muscle relaxer. VSS. Pt nontoxic and pleasant. Neuro exam with nonfocal findings. Pt gait improving. Pt has not eaten today, will reassess gait after meal. Pt reports gait is back to normal after eating lunch.  Pt with a steady yet slow gait. Transient gait instability likely related to medication intolerance as noted at last ED visit. Advised pt to follow up with his orthopedic doctor who is prescribing the muscle relaxer for further management of side effects. Return precautions given in discharge paperwork and discussed with pt. Pt agrees with plan.     Josephina Gip, PA-C 03/13/15 1639

## 2015-03-30 NOTE — Progress Notes (Signed)
Cardiology Office Note   Date:  03/31/2015   ID:  [name], DOB [birth year], MRN [id number]  PCP:  Alan Dress, MD  Cardiologist:  Dr. Lauree [name]   Electrophysiologist:  [name] (seen in 8/13)  Chief Complaint  Patient presents with  . Coronary Artery Disease     History of Present Illness: [name] is a 81 y.o. male with a hx of CAD, HL, atrial flutter. Previously followed by Dr. Lia [name]. Established with Dr. Angelena [name] 02/2014. Cardiac catheterization in 2009 with total occlusion of the LAD treated with BMS to the proximal and mid LAD and balloon angioplasty to the distal LAD. He has a chronically occluded RCA. He was seen by Dr. Rayann [name] in 2013 with concerns for atrial flutter. Monitor demonstrated sinus bradycardia and no pauses. He has not been felt to be a candidate for long-term anticoagulation secondary to hematuria. He was diagnosed with DVT in July 2015 and started on Xarelto. This has since been stopped. Last seen by Dr. Angelena [name] 6/16.  He was seen in the emergency room 2 (last visit 03/13/15) with dizziness. According to the records, it was suspected that this was related to his muscle relaxer.  Here today with his son. He had a couple of episodes of feeling like he was going to fall shortly after getting out of bed. He did take his muscle relaxer, Robaxin, shortly prior to this. Since stopping his muscle relaxer, he has not had any further symptoms. He denies chest pain, significant dyspnea, orthopnea, PND. He has stable pedal edema.  He denies near syncope or syncope.    Studies/Reports Reviewed Today:  Echo 7/13 Moderate focal basal septal hypertrophy, EF 55-60%, normal wall motion, grade 1 diastolic dysfunction, mild BAE, atrial septal lipomatous hypertrophy  Event Monitor 8/13 NSR, rare PVCs, bradycardia, no pauses  Carotid US 6/13 Bilateral ICA 0-39%  LHC 9/09 Ant-apical HK-AK, EF 45% LM:  free of critical  disease. LAD: 60% narrowing at the septal, then 90%, then total occlusion  LCx:  OM with diffuse 60%, tiny subbranch 90%, inferior subbranch 60% RCA: Totally occludedwith bridging collaterals PCI: BMS to the mid LAD, BMS to the proximal LAD, POBA to the distal LAD   Past Medical History  Diagnosis Date  . Myocardial infarct 03/21/2008  . Coronary atherosclerosis of native coronary artery     a. Anterior STEMI 2009 s/p BMSx2 to mid & prox LAD and angioplasty to distal LAD. total RCA with collaterals, moderate Cx plaquing. a. EF 55-60% in 2013.  . Pure hypercholesterolemia     a. Has not tolerated statins in the past.  . Phlebitis and thrombophlebitis of superficial vessels of lower extremities   . Gross hematuria   . Paroxysmal atrial flutter     a. Abnl EKG 01/2012 concerning for atrial flutter, event monitor showed sinus bradycardia only and no pauses. Not felt to be a candidate for longterm anticoag due to hematuria.  . Sinus bradycardia     a. By prior event monitor.  [name] NSVT (nonsustained ventricular tachycardia)     a. Per DC summary from time of STEMI 2009.  [name] HTN (hypertension)   . Right leg DVT     a. Dx 01/2014.  [name] Thrombocytopenia     Past Surgical History  Procedure Laterality Date  . Replacement total knee bilateral    . Carpal tunnel release    . Trigger finger surgery    . Coronary stent placement  Current Outpatient Prescriptions  Medication Sig Dispense Refill  . amLODipine (NORVASC) 5 MG tablet TAKE 1 TABLET BY MOUTH DAILY 30 tablet 5  . aspirin EC 81 MG tablet Take 1 tablet (81 mg total) by mouth daily. 90 tablet 3  . Azelastine HCl 0.15 % SOLN USE 2 SPRAYS NASALLY DAILY TO EACH NOSTRIL  5  . celecoxib (CELEBREX) 200 MG capsule Take 200 mg by mouth daily.    [name] HYDROcodone-acetaminophen (NORCO/VICODIN) 5-325 MG per tablet Take 1 tablet by mouth every 6 (six) hours as needed for moderate pain.    . methocarbamol (ROBAXIN) 500 MG tablet Take 500 mg by  mouth 3 (three) times daily.    . metoprolol tartrate (LOPRESSOR) 25 MG tablet TAKE 1/2 TABLET BY MOUTH 2 TIMES DAILY 30 tablet 5  . nitroGLYCERIN (NITROSTAT) 0.4 MG SL tablet Place 0.4 mg under the tongue every 5 (five) minutes as needed for chest pain.    . pregabalin (LYRICA) 75 MG capsule Take 75 mg by mouth 2 (two) times daily.    . Red Yeast Rice Extract (RED YEAST RICE PO) Take 1,200 mg by mouth daily.     No current facility-administered medications for this visit.    Allergies:   Rosuvastatin    Social History:  The patient  reports that he has never smoked. He has never used smokeless tobacco. He reports that he does not drink alcohol or use illicit drugs.   Family History:  The patient's family history includes Cancer in his sister and sister.    ROS:   Please see the history of present illness.   Review of Systems  Hematologic/Lymphatic: Bruises/bleeds easily.  Musculoskeletal: Positive for back pain and joint swelling.  Neurological: Positive for loss of balance.  All other systems reviewed and are negative.     PHYSICAL EXAM: VS:  BP 134/66 mmHg  Pulse 59  Ht 5\' 11"  (1.803 m)  Wt 161 lb (73.029 kg)  BMI 22.46 kg/m2    Wt Readings from Last 3 Encounters:  03/31/15 161 lb (73.029 kg)  03/13/15 160 lb (72.576 kg)  03/09/15 160 lb (72.576 kg)     GEN: Well nourished, well developed, in no acute distress HEENT: normal Neck: no JVD,   no masses Cardiac:  Normal S1/S2, RRR; no murmur ,  no rubs or gallops, trace bilat LE edema   Respiratory:  clear to auscultation bilaterally, no wheezing, rhonchi or rales. GI: soft, nontender, nondistended, + BS MS: no deformity or atrophy Skin: warm and dry  Neuro:  CNs II-XII intact, Strength and sensation are intact Psych: Normal affect   EKG:  EKG is ordered today.  It demonstrates:   Sinus bradycardia, HR 59, rightward axis (question lead reversal), anterolateral Q waves, QTc 407, no change from prior  tracing   Recent Labs: 09/27/2014: ALT 20 09/28/2014: TSH 2.578 03/13/2015: BUN 20; Creatinine, Ser 1.10; Hemoglobin 13.5; Platelets 110*; Potassium 4.3; Sodium 139    Lipid Panel    Component Value Date/Time   CHOL 92 05/06/2008 1716   TRIG 116 05/06/2008 1716   HDL 24.7* 05/06/2008 1716   CHOLHDL 3.7 CALC 05/06/2008 1716   VLDL 23 05/06/2008 1716   LDLCALC 44 05/06/2008 1716      ASSESSMENT AND PLAN:  1. Balance Disorder:  His symptoms of balance disequilibrium seem to be related to his muscle relaxer.  He seems to be improved. In the emergency room, hemoglobin, potassium, head CT were all unremarkable. ECG demonstrates no significant change  from prior tracings. I recommend no further testing.  If he has further issues with poor balance, we could consider proceeding with an event monitor.  Currently he does not seem to describe dizziness.  He could also FU with his PCP to consider PT for balance training.    2. CAD: Status post anterior STEMI in 2009 treated with BMS 2 to the LAD. He has a chronically occluded RCA. He is having no anginal symptoms. He is not on statin therapy but does take red yeast rice. Continue amlodipine, aspirin, beta blocker.  3. Atrial arrhythmia: He is not having any palpitations to suggest recurrence. He has been deemed a poor candidate for anticoagulation. Continue low-dose beta blocker.  4. Hyperlipidemia: Intolerant of statins. He takes [employer]. Followed by primary care.  5. History of DVT: He is now off of Xarelto. LE edema is stable.    Medication Changes: Current medicines are reviewed at length with the patient today.  Concerns regarding medicines are as outlined above.  The following changes have been made:   Discontinued Medications   No medications on file   Modified Medications   No medications on file   New Prescriptions   No medications on file    Labs/ tests ordered today include:   Orders Placed This Encounter  Procedures   . EKG 12-Lead      Disposition:    FU with Dr. Lauree [name] 2 mos.    Signed, Versie Starks, MHS 03/31/2015 5:24 PM    [facility] [place], [place], [place]  [address] Phone: [phone]; Fax: [phone]

## 2015-03-31 ENCOUNTER — Encounter: Payer: Self-pay | Admitting: Physician Assistant

## 2015-03-31 ENCOUNTER — Ambulatory Visit (INDEPENDENT_AMBULATORY_CARE_PROVIDER_SITE_OTHER): Payer: Medicare Other | Admitting: Physician Assistant

## 2015-03-31 VITALS — BP 134/66 | HR 59 | Ht 71.0 in | Wt 161.0 lb

## 2015-03-31 DIAGNOSIS — I499 Cardiac arrhythmia, unspecified: Secondary | ICD-10-CM | POA: Diagnosis not present

## 2015-03-31 DIAGNOSIS — E785 Hyperlipidemia, unspecified: Secondary | ICD-10-CM | POA: Diagnosis not present

## 2015-03-31 DIAGNOSIS — R29818 Other symptoms and signs involving the nervous system: Secondary | ICD-10-CM | POA: Diagnosis not present

## 2015-03-31 DIAGNOSIS — R2689 Other abnormalities of gait and mobility: Secondary | ICD-10-CM

## 2015-03-31 DIAGNOSIS — I2510 Atherosclerotic heart disease of native coronary artery without angina pectoris: Secondary | ICD-10-CM | POA: Diagnosis not present

## 2015-03-31 DIAGNOSIS — I498 Other specified cardiac arrhythmias: Secondary | ICD-10-CM

## 2015-03-31 DIAGNOSIS — Z86718 Personal history of other venous thrombosis and embolism: Secondary | ICD-10-CM

## 2015-03-31 NOTE — Patient Instructions (Signed)
Medication Instructions:  NO CHANGES  Labwork: NONE  Testing/Procedures: NONE  Follow-Up: WITH DR Angelena Form IN 2 MONTHS OR [facility] SAME DAY DR [facility] [place] IS IN THE OFFICE  Any Other Special Instructions Will Be Listed Below (If Applicable). IF YOU HAVE MORE PROBLEMS WITH YOUR BALANCE, CALL AND WE CAN ORDER AN EVENT MONITOR.

## 2015-06-10 NOTE — Progress Notes (Signed)
Chief Complaint  Patient presents with  . Follow-up    6 month f/u . pt has no complaints, some minor swelling on his feet     History of Present Illness: 81 yo male with history of CAD, HLD, atrial flutter who is here today for cardiac follow up. He has been followed in the past by Dr. Lia Foyer. I met him for the first time in 03/07/14. His CAD has been stable. Last cath 2009. Abnormal EKG in July 2013 concerning for atrial flutter. He was seen in the EP clinic by Dr. Rayann Heman and event monitor showed sinus brady only with no pauses. Not felt to be a candidate for long term anti-coagulation secondary to hematuria. He has not tolerated statins in the past. DVT in July 2015 and started on Xarelto with plan to stop in November 2015. Xarelto was stopped spring 2016. He was seen in the ED in August and September 2016 for dizziness felt to be due to his muscle relaxer. Head CT and EKG were ok. He was seen in our office for f/u September 2016 by Richardson Dopp, PA-C.   He is here today for follow up. He tells me that he has been feeling well. No chest pain or SOB. No palpitations. Dizziness has resolved off of muscle relaxer. Tolerating all meds.   Primary Care Physician: Nelda Bucks   Last Lipid Profile: Followed in primary care.   Past Medical History  Diagnosis Date  . Myocardial infarct ([place]) 03/21/2008  . Coronary atherosclerosis of native coronary artery     a. Anterior STEMI 2009 s/p BMSx2 to mid & prox LAD and angioplasty to distal LAD. total RCA with collaterals, moderate Cx plaquing. a. EF 55-60% in 2013.  . Pure hypercholesterolemia     a. Has not tolerated statins in the past.  . Phlebitis and thrombophlebitis of superficial vessels of lower extremities   . Gross hematuria   . Paroxysmal atrial flutter ([place])     a. Abnl EKG 01/2012 concerning for atrial flutter, event monitor showed sinus bradycardia only and no pauses. Not felt to be a candidate for longterm anticoag due to  hematuria.  . Sinus bradycardia     a. By prior event monitor.  [name] NSVT (nonsustained ventricular tachycardia) ([place])     a. Per DC summary from time of STEMI 2009.  [name] HTN (hypertension)   . Right leg DVT ([place])     a. Dx 01/2014.  [name] Thrombocytopenia [facility])     Past Surgical History  Procedure Laterality Date  . Replacement total knee bilateral    . Carpal tunnel release    . Trigger finger surgery    . Coronary stent placement      Current Outpatient Prescriptions  Medication Sig Dispense Refill  . amLODipine (NORVASC) 5 MG tablet TAKE 1 TABLET BY MOUTH DAILY 30 tablet 5  . aspirin EC 81 MG tablet Take 1 tablet (81 mg total) by mouth daily. 90 tablet 3  . Azelastine HCl 0.15 % SOLN USE 2 SPRAYS NASALLY DAILY TO EACH NOSTRIL  5  . celecoxib (CELEBREX) 200 MG capsule Take 200 mg by mouth daily.    [name] HYDROcodone-acetaminophen (NORCO/VICODIN) 5-325 MG per tablet Take 1 tablet by mouth every 6 (six) hours as needed for moderate pain.    . methocarbamol (ROBAXIN) 500 MG tablet Take 500 mg by mouth 3 (three) times daily.    . metoprolol tartrate (LOPRESSOR) 25 MG tablet TAKE 1/2 TABLET BY MOUTH 2 TIMES  DAILY 30 tablet 5  . nitroGLYCERIN (NITROSTAT) 0.4 MG SL tablet Place 0.4 mg under the tongue every 5 (five) minutes as needed for chest pain.    . pregabalin (LYRICA) 75 MG capsule Take 75 mg by mouth 2 (two) times daily.    . Red Yeast Rice Extract (RED YEAST RICE PO) Take 1,200 mg by mouth daily.     No current facility-administered medications for this visit.    Allergies  Allergen Reactions  . Rosuvastatin Other (See Comments)    Aches    Social History   Social History  . Marital Status: Married    Spouse Name: N/A  . Number of Children: N/A  . Years of Education: N/A   Occupational History  . Not on file.   Social History Main Topics  . Smoking status: Never Smoker   . Smokeless tobacco: Never Used  . Alcohol Use: No  . Drug Use: No  . Sexual Activity: Not on file     Other Topics Concern  . Not on file   Social History Narrative   Lives in [place] w/ wife. 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 Denies tobacco, alcohol, or drug use ever in lifetime.    Family History  Problem Relation Age of Onset  . Cancer Sister   . Cancer Sister     Review of Systems:  As stated in the HPI and otherwise negative.   BP 130/70 mmHg  Pulse 48  Ht 5' 11"  (1.803 m)  Wt 169 lb 12.8 oz (77.021 kg)  BMI 23.69 kg/m2  SpO2 97%  Physical Examination: General: Well developed, well nourished, NAD HEENT: OP clear, mucus membranes moist SKIN: warm, dry. No rashes. Neuro: No focal deficits Musculoskeletal: Muscle strength 5/5 all ext Psychiatric: Mood and affect normal Neck: No JVD, no carotid bruits, no thyromegaly, no lymphadenopathy. Lungs:Clear bilaterally, no wheezes, rhonci, crackles Cardiovascular: Regular rate and rhythm. No murmurs, gallops or rubs. Abdomen:Soft. Bowel sounds present. Non-tender.  Extremities: No lower extremity edema. Pulses are 2 + in the bilateral DP/PT.  Cardiac cath 03/21/08:  1. Ventriculography in the RAO projection reveals hypo-to-akinesis of  the anteroapical segment. The ejection fraction estimate will be  45%.  2. The left main is free of critical disease.  3. The LAD has a 60% narrowing at the septal. There is some mild  plaquing distal to this and then just after another septal, there  is a 90% narrowing, then total occlusion after a small diagonal.  The extent of disease extends into just beyond the next diagonal.  The proximal lesion was covered with a 2.5-mm stent with reduction  from 60%-0%. The mid LAD, which was the site of total occlusion  was reduced from 90% and 100% down to 0%. The distal lesion was  80%-90% in the distal vessel and reduced from 80%-90% to less than  20% residual narrowing with smooth angiographic result and good  runoff into the distal vessel.  4. The circumflex  provides a large marginal branch with diffuse  segmental 60% narrowing followed by a large marginal branch. There  was a tiny subbranch with 90% narrowing and an inferior subbranch  with perhaps 60% narrowing.  5. The right coronary artery is segmentally plaqued in the midvessel  and then totally occluded after an RV branch. This RV branch then  collateralizes the distal vessel. The distal vessel was supplied  by the LAD septals.  EKG:  EKG is not ordered today. The  ekg ordered today demonstrates   Recent Labs: 09/27/2014: ALT 20 09/28/2014: TSH 2.578 03/13/2015: BUN 20; Creatinine, Ser 1.10; Hemoglobin 13.5; Platelets 110*; Potassium 4.3; Sodium 139     Wt Readings from Last 3 Encounters:  06/11/15 169 lb 12.8 oz (77.021 kg)  03/31/15 161 lb (73.029 kg)  03/13/15 160 lb (72.576 kg)     Other studies Reviewed: Additional studies/ records that were reviewed today include: . Review of the above records demonstrates:    Assessment and Plan:   1. CAD: Stable. No changes. Will continue ASA 81 mg daily. Continue beta blocker.    2. Atrial arrythmia: No palpitations. Sinus bradycardia today. Continue beta blocker.   3. Hyperlipidemia: Followed in primary care. He has not tolerated statins.   4. DVT: He is now off of Xarelto. No recurrence of DVT  Current medicines are reviewed at length with the patient today.  The patient does not have concerns regarding medicines.  The following changes have been made:  no change  Labs/ tests ordered today include:  No orders of the defined types were placed in this encounter.    Disposition:   FU with me in 6 months  Signed, Lauree Chandler, MD 06/11/2015 10:14 AM    [facility] [place], [place], [place]  [address] Phone: [phone]; Fax: [phone]

## 2015-06-11 ENCOUNTER — Encounter: Payer: Self-pay | Admitting: Cardiovascular Disease

## 2015-06-11 ENCOUNTER — Ambulatory Visit (INDEPENDENT_AMBULATORY_CARE_PROVIDER_SITE_OTHER): Payer: Medicare Other | Admitting: Cardiovascular Disease

## 2015-06-11 VITALS — BP 130/70 | HR 48 | Ht 71.0 in | Wt 169.8 lb

## 2015-06-11 DIAGNOSIS — E785 Hyperlipidemia, unspecified: Secondary | ICD-10-CM | POA: Diagnosis not present

## 2015-06-11 DIAGNOSIS — I2510 Atherosclerotic heart disease of native coronary artery without angina pectoris: Secondary | ICD-10-CM

## 2015-06-11 NOTE — Patient Instructions (Signed)

## 2015-06-28 ENCOUNTER — Other Ambulatory Visit: Payer: Self-pay | Admitting: Cardiovascular Disease

## 2015-07-12 ENCOUNTER — Other Ambulatory Visit: Payer: Self-pay | Admitting: Cardiovascular Disease

## 2015-08-05 ENCOUNTER — Encounter: Payer: Self-pay | Admitting: Cardiovascular Disease

## 2015-09-01 DIAGNOSIS — L603 Nail dystrophy: Secondary | ICD-10-CM | POA: Insufficient documentation

## 2016-05-01 ENCOUNTER — Emergency Department (HOSPITAL_BASED_OUTPATIENT_CLINIC_OR_DEPARTMENT_OTHER)
Admit: 2016-05-01 | Discharge: 2016-05-01 | Disposition: A | Discharge status: Home / Self Care | Payer: Medicare Other | Attending: Emergency Medicine | Admitting: Emergency Medicine

## 2016-05-01 ENCOUNTER — Emergency Department (HOSPITAL_COMMUNITY)
Admission: EM | Admit: 2016-05-01 | Discharge: 2016-05-01 | Disposition: A | Discharge status: Home / Self Care | Payer: Medicare Other | Attending: Emergency Medicine | Admitting: Emergency Medicine

## 2016-05-01 ENCOUNTER — Other Ambulatory Visit: Payer: Self-pay

## 2016-05-01 ENCOUNTER — Encounter (HOSPITAL_COMMUNITY): Payer: Self-pay | Admitting: Emergency Medicine

## 2016-05-01 ENCOUNTER — Emergency Department (HOSPITAL_COMMUNITY): Payer: Medicare Other

## 2016-05-01 DIAGNOSIS — N189 Chronic kidney disease, unspecified: Secondary | ICD-10-CM | POA: Insufficient documentation

## 2016-05-01 DIAGNOSIS — I252 Old myocardial infarction: Secondary | ICD-10-CM | POA: Insufficient documentation

## 2016-05-01 DIAGNOSIS — R600 Localized edema: Secondary | ICD-10-CM | POA: Diagnosis not present

## 2016-05-01 DIAGNOSIS — R609 Edema, unspecified: Secondary | ICD-10-CM | POA: Diagnosis not present

## 2016-05-01 DIAGNOSIS — I129 Hypertensive chronic kidney disease with stage 1 through stage 4 chronic kidney disease, or unspecified chronic kidney disease: Secondary | ICD-10-CM | POA: Insufficient documentation

## 2016-05-01 DIAGNOSIS — B353 Tinea pedis: Secondary | ICD-10-CM | POA: Diagnosis not present

## 2016-05-01 DIAGNOSIS — L03115 Cellulitis of right lower limb: Secondary | ICD-10-CM

## 2016-05-01 DIAGNOSIS — N289 Disorder of kidney and ureter, unspecified: Secondary | ICD-10-CM

## 2016-05-01 DIAGNOSIS — Z96653 Presence of artificial knee joint, bilateral: Secondary | ICD-10-CM | POA: Insufficient documentation

## 2016-05-01 DIAGNOSIS — Z79899 Other long term (current) drug therapy: Secondary | ICD-10-CM | POA: Diagnosis not present

## 2016-05-01 DIAGNOSIS — Z7982 Long term (current) use of aspirin: Secondary | ICD-10-CM | POA: Diagnosis not present

## 2016-05-01 DIAGNOSIS — I2510 Atherosclerotic heart disease of native coronary artery without angina pectoris: Secondary | ICD-10-CM | POA: Diagnosis not present

## 2016-05-01 DIAGNOSIS — Z955 Presence of coronary angioplasty implant and graft: Secondary | ICD-10-CM | POA: Diagnosis not present

## 2016-05-01 DIAGNOSIS — M7989 Other specified soft tissue disorders: Secondary | ICD-10-CM | POA: Diagnosis present

## 2016-05-01 LAB — CBC WITH DIFFERENTIAL/PLATELET
Basophils Absolute: 0 10*3/uL (ref 0.0–0.1)
Basophils Relative: 0 %
Eosinophils Absolute: 0.1 10*3/uL (ref 0.0–0.7)
Eosinophils Relative: 2 %
HEMATOCRIT: 40.8 % (ref 39.0–52.0)
HEMOGLOBIN: 13.4 g/dL (ref 13.0–17.0)
LYMPHS ABS: 1.5 10*3/uL (ref 0.7–4.0)
Lymphocytes Relative: 25 %
MCH: 28.8 pg (ref 26.0–34.0)
MCHC: 32.8 g/dL (ref 30.0–36.0)
MCV: 87.6 fL (ref 78.0–100.0)
MONOS PCT: 12 %
Monocytes Absolute: 0.7 10*3/uL (ref 0.1–1.0)
NEUTROS ABS: 3.8 10*3/uL (ref 1.7–7.7)
NEUTROS PCT: 62 %
Platelets: 114 10*3/uL — ABNORMAL LOW (ref 150–400)
RBC: 4.66 MIL/uL (ref 4.22–5.81)
RDW: 14.1 % (ref 11.5–15.5)
WBC: 6.2 10*3/uL (ref 4.0–10.5)

## 2016-05-01 LAB — URINALYSIS, ROUTINE W REFLEX MICROSCOPIC
BILIRUBIN URINE: NEGATIVE
Glucose, UA: NEGATIVE mg/dL
HGB URINE DIPSTICK: NEGATIVE
Ketones, ur: NEGATIVE mg/dL
Leukocytes, UA: NEGATIVE
Nitrite: NEGATIVE
PH: 6.5 (ref 5.0–8.0)
Protein, ur: NEGATIVE mg/dL
SPECIFIC GRAVITY, URINE: 1.01 (ref 1.005–1.030)

## 2016-05-01 LAB — COMPREHENSIVE METABOLIC PANEL
ALBUMIN: 4.2 g/dL (ref 3.5–5.0)
ALT: 24 U/L (ref 17–63)
ANION GAP: 6 (ref 5–15)
AST: 28 U/L (ref 15–41)
Alkaline Phosphatase: 50 U/L (ref 38–126)
BUN: 28 mg/dL — ABNORMAL HIGH (ref 6–20)
CHLORIDE: 107 mmol/L (ref 101–111)
CO2: 26 mmol/L (ref 22–32)
CREATININE: 1.26 mg/dL — AB (ref 0.61–1.24)
Calcium: 9.3 mg/dL (ref 8.9–10.3)
GFR calc non Af Amer: 49 mL/min — ABNORMAL LOW (ref >60)
GFR, EST AFRICAN AMERICAN: 57 mL/min — AB (ref >60)
Glucose, Bld: 81 mg/dL (ref 65–99)
Potassium: 4.2 mmol/L (ref 3.5–5.1)
SODIUM: 139 mmol/L (ref 135–145)
Total Bilirubin: 1.2 mg/dL (ref 0.3–1.2)
Total Protein: 6.7 g/dL (ref 6.5–8.1)

## 2016-05-01 LAB — BRAIN NATRIURETIC PEPTIDE: B NATRIURETIC PEPTIDE 5: 301.9 pg/mL — AB (ref 0.0–100.0)

## 2016-05-01 LAB — TROPONIN I: Troponin I: <0.03 ng/mL (ref <0.03)

## 2016-05-01 MED ORDER — FUROSEMIDE 20 MG PO TABS: 20.0000 mg | ORAL_TABLET | Freq: Every day | ORAL | 0 refills | Status: DC

## 2016-05-01 MED ORDER — CEPHALEXIN 500 MG PO CAPS: 500.0000 mg | ORAL_CAPSULE | Freq: Four times a day (QID) | ORAL | 0 refills | Status: DC

## 2016-05-01 MED ORDER — CEPHALEXIN 250 MG PO CAPS
500.0000 mg | ORAL_CAPSULE | Freq: Once | ORAL | Status: AC
  Administered 2016-05-01: 500 mg via ORAL
  Filled 2016-05-01: qty 2

## 2016-05-01 MED ORDER — CLOTRIMAZOLE 1 % EX CREA: TOPICAL_CREAM | CUTANEOUS | 0 refills | Status: DC

## 2016-05-01 MED ORDER — FUROSEMIDE 10 MG/ML IJ SOLN
40.0000 mg | Freq: Once | INTRAMUSCULAR | Status: AC
  Administered 2016-05-01: 40 mg via INTRAVENOUS
  Filled 2016-05-01: qty 4

## 2016-05-01 NOTE — ED Notes (Signed)
Patient transported to X-ray 

## 2016-05-01 NOTE — ED Provider Notes (Signed)
[facility] DEPT Provider Note   CSN: [id number] Arrival date & time: 05/01/16  1128     History   Chief Complaint Chief Complaint  Patient presents with  . Leg Swelling    bilateral    HPI [name] is a 82 y.o. male.  Pt presents to the ED today because of lower extremity swelling.  He said that he noticed it about 2 days ago. The pt denies any CP or SOB.  The pt said there is some redness to his right leg that is new.  The pt does have a hx of a DVT in his right leg, but is not on anticoagulation due to hematuria.      Past Medical History:  Diagnosis Date  . Coronary atherosclerosis of native coronary artery    a. Anterior STEMI 2009 s/p BMSx2 to mid & prox LAD and angioplasty to distal LAD. total RCA with collaterals, moderate Cx plaquing. a. EF 55-60% in 2013.  [name] hematuria   . HTN (hypertension)   . Myocardial infarct 03/21/2008  . NSVT (nonsustained ventricular tachycardia) ([place])    a. Per DC summary from time of STEMI 2009.  [name] Paroxysmal atrial flutter ([place])    a. Abnl EKG 01/2012 concerning for atrial flutter, event monitor showed sinus bradycardia only and no pauses. Not felt to be a candidate for longterm anticoag due to hematuria.  . Phlebitis and thrombophlebitis of superficial vessels of lower extremities   . Pure hypercholesterolemia    a. Has not tolerated statins in the past.  . Right leg DVT ([place])    a. Dx 01/2014.  [name] Sinus bradycardia    a. By prior event monitor.  . Thrombocytopenia [facility])     Patient Active Problem List   Diagnosis Date Noted  . Microscopic hematuria 09/28/2014  . Gait instability 09/27/2014  . Coronary atherosclerosis of native coronary artery   . Sinus bradycardia   . Paroxysmal atrial flutter ([place])   . Pure hypercholesterolemia   . HTN (hypertension)   . Right leg DVT ([place])   . Unsteady gait 12/20/2011  . Urinary retention 06/20/2011  . Degenerative disk disease 12/20/2010  . SUPERFICIAL PHLEBITIS  11/18/2008  . GROSS HEMATURIA 11/18/2008    Past Surgical History:  Procedure Laterality Date  . CARPAL TUNNEL RELEASE    . CORONARY STENT PLACEMENT    . REPLACEMENT TOTAL KNEE BILATERAL    . trigger finger surgery         Home Medications    Prior to Admission medications   Medication Sig Start Date End Date Taking? Authorizing Provider  amLODipine (NORVASC) 5 MG tablet TAKE 1 TABLET BY MOUTH DAILY 07/15/15   Burnell Blanks, MD  aspirin EC 81 MG tablet Take 1 tablet (81 mg total) by mouth daily. 06/03/14   Burnell Blanks, MD  Azelastine HCl 0.15 % SOLN USE 2 SPRAYS NASALLY DAILY TO EACH NOSTRIL 12/24/14   Historical Provider, MD  celecoxib (CELEBREX) 200 MG capsule Take 200 mg by mouth daily.    Historical Provider, MD  cephALEXin (KEFLEX) 500 MG capsule Take 1 capsule (500 mg total) by mouth 4 (four) times daily. 05/01/16   Isla Pence, MD  clotrimazole (LOTRIMIN) 1 % cream Apply to affected area bid for 7 days. 05/01/16   Isla Pence, MD  furosemide (LASIX) 20 MG tablet Take 1 tablet (20 mg total) by mouth daily. 05/01/16   Isla Pence, MD  HYDROcodone-acetaminophen (NORCO/VICODIN) 5-325 MG per tablet Take 1  tablet by mouth every 6 (six) hours as needed for moderate pain.    Historical Provider, MD  methocarbamol (ROBAXIN) 500 MG tablet Take 500 mg by mouth 3 (three) times daily.    Historical Provider, MD  metoprolol tartrate (LOPRESSOR) 25 MG tablet TAKE 1/2 TABLET BY MOUTH 2 TIMES DAILY 06/30/15   Burnell Blanks, MD  nitroGLYCERIN (NITROSTAT) 0.4 MG SL tablet Place 0.4 mg under the tongue every 5 (five) minutes as needed for chest pain.    Historical Provider, MD  pregabalin (LYRICA) 75 MG capsule Take 75 mg by mouth 2 (two) times daily.    Historical Provider, MD  Red Yeast Rice Extract (RED YEAST RICE PO) Take 1,200 mg by mouth daily.    Historical Provider, MD    Family History Family History  Problem Relation Age of Onset  . Cancer Sister     . Cancer Sister     Social History Social History  Substance Use Topics  . Smoking status: Never Smoker  . Smokeless tobacco: Never Used  . Alcohol use No     Allergies   Rosuvastatin   Review of Systems Review of Systems  Musculoskeletal:       Bilateral leg swelling.  Right leg redness.  All other systems reviewed and are negative.    Physical Exam Updated Vital Signs BP 114/71   Pulse 65   Temp 97.5 F (36.4 C) (Oral)   Resp 17   Ht 6' (1.829 m)   Wt 170 lb (77.1 kg)   SpO2 96%   BMI 23.06 kg/m   Physical Exam  Constitutional: He is oriented to person, place, and time. He appears well-developed and well-nourished.  HENT:  Head: Normocephalic and atraumatic.  Right Ear: External ear normal.  Left Ear: External ear normal.  Nose: Nose normal.  Mouth/Throat: Oropharynx is clear and moist.  Eyes: Conjunctivae and EOM are normal. Pupils are equal, round, and reactive to light.  Neck: Normal range of motion. Neck supple.  Cardiovascular: Normal rate, regular rhythm, normal heart sounds and intact distal pulses.   Pulmonary/Chest: Effort normal and breath sounds normal.  Abdominal: Soft. Bowel sounds are normal.  Musculoskeletal: He exhibits edema.  Bilateral le with 2+ pitting edema Right lower leg with cellulitis below knee Both feet with fungal infection b/t toes  Neurological: He is alert and oriented to person, place, and time.  Skin: Skin is warm.  Psychiatric: He has a normal mood and affect. His behavior is normal. Judgment and thought content normal.  Nursing note and vitals reviewed.    ED Treatments / Results  Labs (all labs ordered are listed, but only abnormal results are displayed) Labs Reviewed  COMPREHENSIVE METABOLIC PANEL - Abnormal; Notable for the following:       Result Value   BUN 28 (*)    Creatinine, Ser 1.26 (*)    GFR calc non Af Amer 49 (*)    GFR calc Af Amer 57 (*)    All other components within normal limits  CBC WITH  DIFFERENTIAL/PLATELET - Abnormal; Notable for the following:    Platelets 114 (*)    All other components within normal limits  BRAIN NATRIURETIC PEPTIDE - Abnormal; Notable for the following:    B Natriuretic Peptide 301.9 (*)    All other components within normal limits  TROPONIN I  URINALYSIS, ROUTINE W REFLEX MICROSCOPIC (NOT AT [facility])    EKG  EKG Interpretation  Date/Time:  Saturday May 01 2016 12:16:19 EDT  Ventricular Rate:  64 PR Interval:    QRS Duration: 107 QT Interval:  428 QTC Calculation: 442 R Axis:   -82 Text Interpretation:  Ventricular premature complex Anterolateral infarct, old Baseline wander in lead(s) V2 V3 not a good tracing.  will repeat Confirmed by [facility] MD, Celestial Barnfield [phone]) on 05/01/2016 12:55:44 PM       Radiology Dg Chest 2 View  Result Date: 05/01/2016 CLINICAL DATA:  Pt c/o swelling of bilateral lower legs since yesterday. No other complaints. Hx HTN, coronary stent placement, Non-smoker. EXAM: CHEST  2 VIEW COMPARISON:  11/19/2009 FINDINGS: Cardiac silhouette is top-normal in size. No mediastinal or hilar masses. No evidence of adenopathy. There is linear opacity at the right lung base consistent with atelectasis. Lungs are otherwise clear. No pleural effusion or pneumothorax. Skeletal structures are demineralized but intact. IMPRESSION: No acute cardiopulmonary disease. Electronically Signed   By: Lajean Manes M.D.   On: 05/01/2016 13:06    Procedures Procedures (including critical care time)  Medications Ordered in ED Medications  furosemide (LASIX) injection 40 mg (40 mg Intravenous Given 05/01/16 1432)  cephALEXin (KEFLEX) capsule 500 mg (500 mg Oral Given 05/01/16 1432)     Initial Impression / Assessment and Plan / ED Course  I have reviewed the triage vital signs and the nursing notes.  Pertinent labs & imaging results that were available during my care of the patient were reviewed by me and considered in my medical decision  making (see chart for details).  Clinical Course    Pt is feeling better.  He will be put on a short course of low dose lasix.  He will also be placed on keflex for the cellulitis and told to take a probiotic while on abx.  His [place] was negative for DVT.  He knows to f/u with his dr and to return if worse.  Final Clinical Impressions(s) / ED Diagnoses   Final diagnoses:  Bilateral lower extremity edema  Cellulitis of right leg  Tinea pedis of right foot  Chronic renal impairment, unspecified CKD stage    New Prescriptions New Prescriptions   CEPHALEXIN (KEFLEX) 500 MG CAPSULE    Take 1 capsule (500 mg total) by mouth 4 (four) times daily.   CLOTRIMAZOLE (LOTRIMIN) 1 % CREAM    Apply to affected area bid for 7 days.   FUROSEMIDE (LASIX) 20 MG TABLET    Take 1 tablet (20 mg total) by mouth daily.     Isla Pence, MD 05/01/16 [phone]

## 2016-05-01 NOTE — Progress Notes (Signed)
*  PRELIMINARY RESULTS* Vascular Ultrasound Lower extremity venous duplex has been completed.  Preliminary findings: No evidence of DVT or baker's cyst. Pulsatile venous flow suggestive of increased right side heart pressure.   Landry Mellow, RDMS, RVT  05/01/2016, 1:22 PM

## 2016-05-01 NOTE — Discharge Instructions (Signed)
OTC probiotic while on keflex. Call your doctor to get your kidney function rechecked in about 1 week.

## 2016-05-01 NOTE — ED Notes (Signed)
EDP at bedside  

## 2016-05-01 NOTE — ED Triage Notes (Signed)
Pt. Stated, both of my legs have been swelling for 2 days . Both legs are tight to touch. Knee down

## 2016-05-07 ENCOUNTER — Encounter: Payer: Self-pay | Admitting: Cardiovascular Disease

## 2016-05-07 ENCOUNTER — Ambulatory Visit (INDEPENDENT_AMBULATORY_CARE_PROVIDER_SITE_OTHER): Payer: Medicare Other | Admitting: Cardiovascular Disease

## 2016-05-07 VITALS — BP 142/68 | HR 80 | Ht 72.0 in | Wt 173.0 lb

## 2016-05-07 DIAGNOSIS — I4891 Unspecified atrial fibrillation: Secondary | ICD-10-CM | POA: Diagnosis not present

## 2016-05-07 DIAGNOSIS — I2510 Atherosclerotic heart disease of native coronary artery without angina pectoris: Secondary | ICD-10-CM | POA: Diagnosis not present

## 2016-05-07 DIAGNOSIS — I5031 Acute diastolic (congestive) heart failure: Secondary | ICD-10-CM

## 2016-05-07 DIAGNOSIS — E7800 Pure hypercholesterolemia, unspecified: Secondary | ICD-10-CM

## 2016-05-07 MED ORDER — RIVAROXABAN 15 MG PO TABS: 15.0000 mg | ORAL_TABLET | Freq: Every day | ORAL | 6 refills | Status: DC

## 2016-05-07 MED ORDER — FUROSEMIDE 20 MG PO TABS: 20.0000 mg | ORAL_TABLET | Freq: Every day | ORAL | 6 refills | Status: DC

## 2016-05-07 NOTE — Progress Notes (Signed)
Chief Complaint  Patient presents with  . Follow-up    pt states no chest pain no SOB   . Shortness of Breath    legs ankles and feet      History of Present Illness: 82 yo male with history of CAD, HLD, atrial flutter who is here today for cardiac follow up. He has been followed in the past by Dr. Riley Kill. I met him for the first time in 03/07/14. His CAD has been stable. Last cath 2009. Abnormal EKG in July 2013 concerning for atrial flutter. He was seen in the EP clinic by Dr. Johney Frame and event monitor showed sinus brady only with no pauses. He has not tolerated statins in the past. DVT in July 2015 and started on Xarelto with plan to stop in November 2015. Xarelto was stopped spring 2016. He was seen in the ED in August and September 2016 for dizziness felt to be due to his muscle relaxer. Head CT and EKG were ok. Seen in ED last week with swollen legs. Venous dopplers negative for DVT. Treated for cellulitis and given Lasix. EKG that day with atrial fib.    He is here today for follow up. He tells me that his swelling is better on Lasix. No dyspnea. Redness resolved on legs.  No chest pain or SOB. No palpitations.   Primary Care Physician: Paulina Fusi, MD  Past Medical History:  Diagnosis Date  . Coronary atherosclerosis of native coronary artery    a. Anterior STEMI 2009 s/p BMSx2 to mid & prox LAD and angioplasty to distal LAD. total RCA with collaterals, moderate Cx plaquing. a. EF 55-60% in 2013.  Michaell Cowing hematuria   . HTN (hypertension)   . Myocardial infarct 03/21/2008  . NSVT (nonsustained ventricular tachycardia) ([facility])    a. Per DC summary from time of STEMI 2009.  [name] Paroxysmal atrial flutter ([facility])    a. Abnl EKG 01/2012 concerning for atrial flutter, event monitor showed sinus bradycardia only and no pauses. Not felt to be a candidate for longterm anticoag due to hematuria.  . Phlebitis and thrombophlebitis of superficial vessels of lower extremities   . Pure  hypercholesterolemia    a. Has not tolerated statins in the past.  . Right leg DVT ([facility])    a. Dx 01/2014.  [name] Sinus bradycardia    a. By prior event monitor.  . Thrombocytopenia ([facility])     Past Surgical History:  Procedure Laterality Date  . CARPAL TUNNEL RELEASE    . CORONARY STENT PLACEMENT    . REPLACEMENT TOTAL KNEE BILATERAL    . trigger finger surgery      Current Outpatient Prescriptions  Medication Sig Dispense Refill  . amLODipine (NORVASC) 5 MG tablet TAKE 1 TABLET BY MOUTH DAILY 30 tablet 11  . Azelastine HCl 0.15 % SOLN USE 2 SPRAYS NASALLY DAILY TO EACH NOSTRIL  5  . cephALEXin (KEFLEX) 500 MG capsule Take 1 capsule (500 mg total) by mouth 4 (four) times daily. 28 capsule 0  . clotrimazole (LOTRIMIN) 1 % cream Apply to affected area bid for 7 days. 15 g 0  . furosemide (LASIX) 20 MG tablet Take 1 tablet (20 mg total) by mouth daily. 30 tablet 6  . metoprolol tartrate (LOPRESSOR) 25 MG tablet TAKE 1/2 TABLET BY MOUTH 2 TIMES DAILY 30 tablet 10  . nitroGLYCERIN (NITROSTAT) 0.4 MG SL tablet Place 0.4 mg under the tongue every 5 (five) minutes as needed for chest pain.    [name]  pregabalin (LYRICA) 75 MG capsule Take 75 mg by mouth 2 (two) times daily.    . Red Yeast Rice Extract (RED YEAST RICE PO) Take 1,200 mg by mouth daily.    . Rivaroxaban (XARELTO) 15 MG TABS tablet Take 1 tablet (15 mg total) by mouth daily with supper. 30 tablet 6   No current facility-administered medications for this visit.     Allergies  Allergen Reactions  . Rosuvastatin Other (See Comments)    Aches    Social History   Social History  . Marital status: Married    Spouse name: N/A  . Number of children: N/A  . Years of education: N/A   Occupational History  . Not on file.   Social History Main Topics  . Smoking status: Never Smoker  . Smokeless tobacco: Never Used  . Alcohol use No  . Drug use: No  . Sexual activity: Not on file   Other Topics Concern  . Not on file   Social  History Narrative   Lives in [place] w/ wife. 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 Denies tobacco, alcohol, or drug use ever in lifetime.    Family History  Problem Relation Age of Onset  . Cancer Sister   . Cancer Sister     Review of Systems:  As stated in the HPI and otherwise negative.   BP (!) 142/68   Pulse 80   Ht 6' (1.829 m)   Wt 173 lb (78.5 kg)   BMI 23.46 kg/m   Physical Examination: General: Well developed, well nourished, NAD  HEENT: OP clear, mucus membranes moist  SKIN: warm, dry. No rashes. Neuro: No focal deficits  Musculoskeletal: Muscle strength 5/5 all ext  Psychiatric: Mood and affect normal  Neck: No JVD, no carotid bruits, no thyromegaly, no lymphadenopathy.  Lungs:Clear bilaterally, no wheezes, rhonci, crackles Cardiovascular: Irreg irerg with no murmurs, gallops or rubs. Abdomen:Soft. Bowel sounds present. Non-tender.  Extremities: No lower extremity edema. Pulses are 2 + in the bilateral DP/PT.  Cardiac cath 03/21/08:  1. Ventriculography in the RAO projection reveals hypo-to-akinesis of  the anteroapical segment. The ejection fraction estimate will be  45%.  2. The left main is free of critical disease.  3. The LAD has a 60% narrowing at the septal. There is some mild  plaquing distal to this and then just after another septal, there  is a 90% narrowing, then total occlusion after a small diagonal.  The extent of disease extends into just beyond the next diagonal.  The proximal lesion was covered with a 2.5-mm stent with reduction  from 60%-0%. The mid LAD, which was the site of total occlusion  was reduced from 90% and 100% down to 0%. The distal lesion was  80%-90% in the distal vessel and reduced from 80%-90% to less than  20% residual narrowing with smooth angiographic result and good  runoff into the distal vessel.  4. The circumflex provides a large marginal branch with diffuse  segmental 60% narrowing followed  by a large marginal branch. There  was a tiny subbranch with 90% narrowing and an inferior subbranch  with perhaps 60% narrowing.  5. The right coronary artery is segmentally plaqued in the midvessel  and then totally occluded after an RV branch. This RV branch then  collateralizes the distal vessel. The distal vessel was supplied  by the LAD septals.  Echo July 2013: - Left ventricle: The cavity size was normal. There was moderate  focal basal hypertrophy of the septum. Systolic function was normal. The estimated ejection fraction was in the range of 55% to 60%. Wall motion was normal; there were no regional wall motion abnormalities. Doppler parameters are consistent with abnormal left ventricular relaxation (grade 1 diastolic dysfunction). - Left atrium: The atrium was mildly dilated. - Right atrium: The atrium was mildly dilated. - Atrial septum: There was increased thickness of the septum, consistent with lipomatous hypertrophy.  EKG:  EKG is ordered today. The ekg ordered today demonstrates atrial fib, rate 80 bpm. LAFB. Poor R wave progression  Recent Labs: 05/01/2016: ALT 24; B Natriuretic Peptide 301.9; BUN 28; Creatinine, Ser 1.26; Hemoglobin 13.4; Platelets 114; Potassium 4.2; Sodium 139     Wt Readings from Last 3 Encounters:  05/07/16 173 lb (78.5 kg)  05/01/16 170 lb (77.1 kg)  06/11/15 169 lb 12.8 oz (77 kg)     Other studies Reviewed: Additional studies/ records that were reviewed today include: . Review of the above records demonstrates:    Assessment and Plan:   1. CAD without angina: No chest pain suggestive of angina. Will stop ASA since we are starting Xarelto. He does not tolerate statins. Continue beta blocker.     2. Atrial fibrillation, new onset: No palpitations. Will continue metoprolol for rate control. 20 minutes spent discussing atrial fib. Will start Xarelto 15 mg daily. CHADS VASC score 4. He has taken this for DVT several years  ago with no bleeding issues.    3. Hyperlipidemia: Followed in primary care. He has not tolerated statins.   4. Acute diastolic CHF: Likely due to atrial fib. Will continue Lasix 20 mg daily. Check BMET today.   Current medicines are reviewed at length with the patient today.  The patient does not have concerns regarding medicines.  The following changes have been made:  no change  Labs/ tests ordered today include:   Orders Placed This Encounter  Procedures  . Basic Metabolic Panel (BMET)  . EKG 12-Lead    Disposition:   FU with me in 3 months  Signed, Lauree Chandler, MD 05/07/2016 3:32 PM    [facility] [address], [place], [place]  [address] Phone: [phone]; Fax: [phone]

## 2016-05-07 NOTE — Patient Instructions (Addendum)
Medication Instructions:  Your physician has recommended you make the following change in your medication:  Start Xarelto 15 mg by mouth daily with evening meal. Stop aspirin Stop Celebrex   Labwork: Lab work to be done today--BMP  Testing/Procedures: none  Follow-Up: Your physician recommends that you schedule a follow-up appointment in: 3 months.     Any Other Special Instructions Will Be Listed Below (If Applicable).     If you need a refill on your cardiac medications before your next appointment, please call your pharmacy.

## 2016-05-08 LAB — BASIC METABOLIC PANEL
BUN: 28 mg/dL — ABNORMAL HIGH (ref 7–25)
CO2: 29 mmol/L (ref 20–31)
Calcium: 9.3 mg/dL (ref 8.6–10.3)
Chloride: 101 mmol/L (ref 98–110)
Creat: 1.28 mg/dL — ABNORMAL HIGH (ref 0.70–1.11)
Glucose, Bld: 101 mg/dL — ABNORMAL HIGH (ref 65–99)
POTASSIUM: 4.3 mmol/L (ref 3.5–5.3)
SODIUM: 139 mmol/L (ref 135–146)

## 2016-05-15 ENCOUNTER — Observation Stay (HOSPITAL_COMMUNITY)
Admission: EM | Admit: 2016-05-15 | Discharge: 2016-05-18 | Disposition: A | Discharge status: Home / Self Care | Payer: Medicare Other | Attending: Internal Medicine | Admitting: Internal Medicine

## 2016-05-15 ENCOUNTER — Emergency Department (HOSPITAL_COMMUNITY): Payer: Medicare Other

## 2016-05-15 ENCOUNTER — Encounter (HOSPITAL_COMMUNITY): Payer: Self-pay | Admitting: Emergency Medicine

## 2016-05-15 DIAGNOSIS — J181 Lobar pneumonia, unspecified organism: Secondary | ICD-10-CM

## 2016-05-15 DIAGNOSIS — I252 Old myocardial infarction: Secondary | ICD-10-CM

## 2016-05-15 DIAGNOSIS — Z955 Presence of coronary angioplasty implant and graft: Secondary | ICD-10-CM

## 2016-05-15 DIAGNOSIS — I482 Chronic atrial fibrillation, unspecified: Secondary | ICD-10-CM

## 2016-05-15 DIAGNOSIS — E7800 Pure hypercholesterolemia, unspecified: Secondary | ICD-10-CM | POA: Insufficient documentation

## 2016-05-15 DIAGNOSIS — Z9981 Dependence on supplemental oxygen: Secondary | ICD-10-CM

## 2016-05-15 DIAGNOSIS — R042 Hemoptysis: Secondary | ICD-10-CM | POA: Diagnosis present

## 2016-05-15 DIAGNOSIS — Z79899 Other long term (current) drug therapy: Secondary | ICD-10-CM | POA: Diagnosis not present

## 2016-05-15 DIAGNOSIS — J189 Pneumonia, unspecified organism: Principal | ICD-10-CM | POA: Diagnosis present

## 2016-05-15 DIAGNOSIS — I4892 Unspecified atrial flutter: Secondary | ICD-10-CM | POA: Insufficient documentation

## 2016-05-15 DIAGNOSIS — Z7901 Long term (current) use of anticoagulants: Secondary | ICD-10-CM | POA: Diagnosis not present

## 2016-05-15 DIAGNOSIS — I5033 Acute on chronic diastolic (congestive) heart failure: Secondary | ICD-10-CM

## 2016-05-15 DIAGNOSIS — Z86718 Personal history of other venous thrombosis and embolism: Secondary | ICD-10-CM

## 2016-05-15 DIAGNOSIS — R0602 Shortness of breath: Secondary | ICD-10-CM

## 2016-05-15 DIAGNOSIS — Z880 Allergy status to penicillin: Secondary | ICD-10-CM

## 2016-05-15 DIAGNOSIS — E785 Hyperlipidemia, unspecified: Secondary | ICD-10-CM | POA: Diagnosis not present

## 2016-05-15 DIAGNOSIS — Z96653 Presence of artificial knee joint, bilateral: Secondary | ICD-10-CM | POA: Diagnosis not present

## 2016-05-15 DIAGNOSIS — D693 Immune thrombocytopenic purpura: Secondary | ICD-10-CM | POA: Diagnosis not present

## 2016-05-15 DIAGNOSIS — R059 Cough, unspecified: Secondary | ICD-10-CM

## 2016-05-15 DIAGNOSIS — R778 Other specified abnormalities of plasma proteins: Secondary | ICD-10-CM

## 2016-05-15 DIAGNOSIS — Z888 Allergy status to other drugs, medicaments and biological substances status: Secondary | ICD-10-CM

## 2016-05-15 DIAGNOSIS — I214 Non-ST elevation (NSTEMI) myocardial infarction: Secondary | ICD-10-CM | POA: Insufficient documentation

## 2016-05-15 DIAGNOSIS — Z634 Disappearance and death of family member: Secondary | ICD-10-CM

## 2016-05-15 DIAGNOSIS — I130 Hypertensive heart and chronic kidney disease with heart failure and stage 1 through stage 4 chronic kidney disease, or unspecified chronic kidney disease: Secondary | ICD-10-CM | POA: Diagnosis not present

## 2016-05-15 DIAGNOSIS — I2510 Atherosclerotic heart disease of native coronary artery without angina pectoris: Secondary | ICD-10-CM | POA: Insufficient documentation

## 2016-05-15 DIAGNOSIS — I5032 Chronic diastolic (congestive) heart failure: Secondary | ICD-10-CM | POA: Diagnosis not present

## 2016-05-15 DIAGNOSIS — R0902 Hypoxemia: Secondary | ICD-10-CM

## 2016-05-15 DIAGNOSIS — Z809 Family history of malignant neoplasm, unspecified: Secondary | ICD-10-CM

## 2016-05-15 DIAGNOSIS — N183 Chronic kidney disease, stage 3 (moderate): Secondary | ICD-10-CM

## 2016-05-15 DIAGNOSIS — J3489 Other specified disorders of nose and nasal sinuses: Secondary | ICD-10-CM

## 2016-05-15 DIAGNOSIS — R7989 Other specified abnormal findings of blood chemistry: Secondary | ICD-10-CM

## 2016-05-15 DIAGNOSIS — R05 Cough: Secondary | ICD-10-CM

## 2016-05-15 DIAGNOSIS — R0981 Nasal congestion: Secondary | ICD-10-CM

## 2016-05-15 LAB — URINALYSIS, ROUTINE W REFLEX MICROSCOPIC
BILIRUBIN URINE: NEGATIVE
GLUCOSE, UA: NEGATIVE mg/dL
KETONES UR: 15 mg/dL — AB
Leukocytes, UA: NEGATIVE
Nitrite: NEGATIVE
PH: 6 (ref 5.0–8.0)
PROTEIN: NEGATIVE mg/dL
Specific Gravity, Urine: 1.02 (ref 1.005–1.030)

## 2016-05-15 LAB — COMPREHENSIVE METABOLIC PANEL
ALT: 38 U/L (ref 17–63)
AST: 52 U/L — AB (ref 15–41)
Albumin: 3.8 g/dL (ref 3.5–5.0)
Alkaline Phosphatase: 54 U/L (ref 38–126)
Anion gap: 9 (ref 5–15)
BUN: 33 mg/dL — AB (ref 6–20)
CHLORIDE: 101 mmol/L (ref 101–111)
CO2: 27 mmol/L (ref 22–32)
CREATININE: 1.39 mg/dL — AB (ref 0.61–1.24)
Calcium: 9 mg/dL (ref 8.9–10.3)
GFR, EST AFRICAN AMERICAN: 50 mL/min — AB (ref >60)
GFR, EST NON AFRICAN AMERICAN: 43 mL/min — AB (ref >60)
Glucose, Bld: 105 mg/dL — ABNORMAL HIGH (ref 65–99)
POTASSIUM: 3.7 mmol/L (ref 3.5–5.1)
SODIUM: 137 mmol/L (ref 135–145)
Total Bilirubin: 1.3 mg/dL — ABNORMAL HIGH (ref 0.3–1.2)
Total Protein: 6.4 g/dL — ABNORMAL LOW (ref 6.5–8.1)

## 2016-05-15 LAB — URINE MICROSCOPIC-ADD ON

## 2016-05-15 LAB — CBC WITH DIFFERENTIAL/PLATELET
BASOS ABS: 0 10*3/uL (ref 0.0–0.1)
Basophils Relative: 0 %
EOS ABS: 0 10*3/uL (ref 0.0–0.7)
EOS PCT: 0 %
HCT: 37.4 % — ABNORMAL LOW (ref 39.0–52.0)
Hemoglobin: 12.2 g/dL — ABNORMAL LOW (ref 13.0–17.0)
Lymphocytes Relative: 11 %
Lymphs Abs: 0.8 10*3/uL (ref 0.7–4.0)
MCH: 28.4 pg (ref 26.0–34.0)
MCHC: 32.6 g/dL (ref 30.0–36.0)
MCV: 87 fL (ref 78.0–100.0)
MONO ABS: 0.6 10*3/uL (ref 0.1–1.0)
Monocytes Relative: 8 %
Neutro Abs: 5.8 10*3/uL (ref 1.7–7.7)
Neutrophils Relative %: 81 %
PLATELETS: 93 10*3/uL — AB (ref 150–400)
RBC: 4.3 MIL/uL (ref 4.22–5.81)
RDW: 13.9 % (ref 11.5–15.5)
WBC: 7.2 10*3/uL (ref 4.0–10.5)

## 2016-05-15 LAB — INFLUENZA PANEL BY PCR (TYPE A & B)
INFLAPCR: NEGATIVE
INFLBPCR: NEGATIVE

## 2016-05-15 LAB — I-STAT TROPONIN, ED: TROPONIN I, POC: 0.59 ng/mL — AB (ref 0.00–0.08)

## 2016-05-15 LAB — TROPONIN I
Troponin I: 0.91 ng/mL (ref <0.03)
Troponin I: 0.94 ng/mL (ref <0.03)

## 2016-05-15 LAB — I-STAT CG4 LACTIC ACID, ED: LACTIC ACID, VENOUS: 2.13 mmol/L — AB (ref 0.5–1.9)

## 2016-05-15 LAB — TYPE AND SCREEN
ABO/RH(D): A POS
Antibody Screen: NEGATIVE

## 2016-05-15 LAB — BRAIN NATRIURETIC PEPTIDE: B NATRIURETIC PEPTIDE 5: 355.6 pg/mL — AB (ref 0.0–100.0)

## 2016-05-15 LAB — LACTIC ACID, PLASMA: LACTIC ACID, VENOUS: 1.1 mmol/L (ref 0.5–1.9)

## 2016-05-15 MED ORDER — AMLODIPINE BESYLATE 5 MG PO TABS
5.0000 mg | ORAL_TABLET | Freq: Every day | ORAL | Status: DC
Start: 2016-05-15 — End: 2016-05-18
  Administered 2016-05-15 – 2016-05-18 (×4): 5 mg via ORAL
  Filled 2016-05-15 (×4): qty 1

## 2016-05-15 MED ORDER — AZELASTINE HCL 0.1 % NA SOLN
1.0000 | Freq: Two times a day (BID) | NASAL | Status: DC
  Administered 2016-05-15 – 2016-05-17 (×5): 1 via NASAL
  Filled 2016-05-15: qty 30

## 2016-05-15 MED ORDER — ONDANSETRON HCL 4 MG PO TABS: 4.0000 mg | ORAL_TABLET | Freq: Four times a day (QID) | ORAL | Status: DC | PRN

## 2016-05-15 MED ORDER — PREGABALIN 75 MG PO CAPS
75.0000 mg | ORAL_CAPSULE | Freq: Every evening | ORAL | Status: DC
  Administered 2016-05-15 – 2016-05-16 (×2): 75 mg via ORAL
  Filled 2016-05-15 (×2): qty 1

## 2016-05-15 MED ORDER — SODIUM CHLORIDE 0.9% FLUSH
3.0000 mL | Freq: Two times a day (BID) | INTRAVENOUS | Status: DC
  Administered 2016-05-15 – 2016-05-18 (×6): 3 mL via INTRAVENOUS

## 2016-05-15 MED ORDER — METOPROLOL TARTRATE 12.5 MG HALF TABLET
12.5000 mg | ORAL_TABLET | Freq: Two times a day (BID) | ORAL | Status: DC
  Administered 2016-05-15 – 2016-05-16 (×3): 12.5 mg via ORAL
  Filled 2016-05-15 (×3): qty 1

## 2016-05-15 MED ORDER — METOPROLOL TARTRATE 5 MG/5ML IV SOLN
2.5000 mg | Freq: Once | INTRAVENOUS | Status: AC
  Administered 2016-05-15: 2.5 mg via INTRAVENOUS
  Filled 2016-05-15: qty 5

## 2016-05-15 MED ORDER — INFLUENZA VAC SPLIT QUAD 0.5 ML IM SUSY: 0.5000 mL | PREFILLED_SYRINGE | INTRAMUSCULAR | Status: DC

## 2016-05-15 MED ORDER — SENNOSIDES-DOCUSATE SODIUM 8.6-50 MG PO TABS: 1.0000 | ORAL_TABLET | Freq: Every evening | ORAL | Status: DC | PRN

## 2016-05-15 MED ORDER — LEVOFLOXACIN IN D5W 500 MG/100ML IV SOLN
500.0000 mg | Freq: Once | INTRAVENOUS | Status: AC
  Administered 2016-05-15: 500 mg via INTRAVENOUS
  Filled 2016-05-15: qty 100

## 2016-05-15 MED ORDER — CLOTRIMAZOLE 1 % EX CREA
1.0000 "application " | TOPICAL_CREAM | Freq: Two times a day (BID) | CUTANEOUS | Status: DC | PRN
  Filled 2016-05-15: qty 15

## 2016-05-15 MED ORDER — ACETAMINOPHEN 325 MG PO TABS
650.0000 mg | ORAL_TABLET | Freq: Once | ORAL | Status: AC
Start: 2016-05-15 — End: 2016-05-15
  Administered 2016-05-15: 650 mg via ORAL
  Filled 2016-05-15: qty 2

## 2016-05-15 MED ORDER — SODIUM CHLORIDE 0.9 % IV BOLUS (SEPSIS)
250.0000 mL | Freq: Once | INTRAVENOUS | Status: AC
  Administered 2016-05-15: 250 mL via INTRAVENOUS

## 2016-05-15 MED ORDER — SODIUM CHLORIDE 0.9 % IV BOLUS (SEPSIS)
500.0000 mL | Freq: Once | INTRAVENOUS | Status: AC
  Administered 2016-05-15: 500 mL via INTRAVENOUS

## 2016-05-15 MED ORDER — ACETAMINOPHEN 325 MG PO TABS: 650.0000 mg | ORAL_TABLET | Freq: Four times a day (QID) | ORAL | Status: DC | PRN

## 2016-05-15 MED ORDER — LEVOFLOXACIN 500 MG PO TABS
500.0000 mg | ORAL_TABLET | Freq: Every day | ORAL | Status: DC
  Administered 2016-05-16 – 2016-05-18 (×3): 500 mg via ORAL
  Filled 2016-05-15 (×3): qty 1

## 2016-05-15 MED ORDER — ONDANSETRON HCL 4 MG/2ML IJ SOLN: 4.0000 mg | Freq: Four times a day (QID) | INTRAMUSCULAR | Status: DC | PRN

## 2016-05-15 MED ORDER — PNEUMOCOCCAL VAC POLYVALENT 25 MCG/0.5ML IJ INJ
0.5000 mL | INJECTION | INTRAMUSCULAR | Status: DC
  Filled 2016-05-15: qty 0.5

## 2016-05-15 MED ORDER — ACETAMINOPHEN 650 MG RE SUPP: 650.0000 mg | Freq: Four times a day (QID) | RECTAL | Status: DC | PRN

## 2016-05-15 MED ORDER — LEVOFLOXACIN 750 MG PO TABS: 750.0000 mg | ORAL_TABLET | Freq: Every day | ORAL | Status: DC

## 2016-05-15 NOTE — ED Triage Notes (Signed)
Pt reports coughing up blood for past several days.  According to pt's daughter, cough started a few days before 10-10-2022.  Pt's wife died on 2022-10-10, funeral scheduled for tomorrow.  Pt denies any pain at present.  States chills and body aches for past few days but denies any known fever at home.  Per daughter, pt has been very weak the past few days also.  Denies any n/v/d.  Pt does take Xarelto for Afib.

## 2016-05-15 NOTE — ED Provider Notes (Signed)
[facility] DEPT Provider Note   CSN: [id number] Arrival date & time: 05/15/16  0905     History   Chief Complaint Chief Complaint  Patient presents with  . Cough  . Hemoptysis    HPI [name] is a 82 y.o. male.  HPI Patient presents with increasing generalized weakness and cough productive of bloody sputum. He's had subjective fevers and chills at home. Seen by his primary physician this week and given injection of unknown medication and course of prednisone. States he did not take the prednisone. Hemoptysis worsened overnight. Grossly bloody. Patient is on Xarelto which was recently started for atrial fibrillation. He had several weeks of worsening lower extremity edema and was evaluated in the emergency department. Had Doppler studies that showed no DVT. He also is on Lasix for CHF. Denies any chest pain currently. Past Medical History:  Diagnosis Date  . Coronary atherosclerosis of native coronary artery    a. Anterior STEMI 2009 s/p BMSx2 to mid & prox LAD and angioplasty to distal LAD. total RCA with collaterals, moderate Cx plaquing. a. EF 55-60% in 2013.  [name] hematuria   . HTN (hypertension)   . Myocardial infarct 03/21/2008  . NSVT (nonsustained ventricular tachycardia) ([place])    a. Per DC summary from time of STEMI 2009.  [name] Paroxysmal atrial flutter ([place])    a. Abnl EKG 01/2012 concerning for atrial flutter, event monitor showed sinus bradycardia only and no pauses. Not felt to be a candidate for longterm anticoag due to hematuria.  . Phlebitis and thrombophlebitis of superficial vessels of lower extremities   . Pure hypercholesterolemia    a. Has not tolerated statins in the past.  . Right leg DVT ([place])    a. Dx 01/2014.  [name] Sinus bradycardia    a. By prior event monitor.  . Thrombocytopenia [facility])     Patient Active Problem List   Diagnosis Date Noted  . Microscopic hematuria 09/28/2014  . Gait instability 09/27/2014  . Coronary atherosclerosis  of native coronary artery   . Sinus bradycardia   . Paroxysmal atrial flutter ([place])   . Pure hypercholesterolemia   . HTN (hypertension)   . Right leg DVT ([place])   . Unsteady gait 12/20/2011  . Urinary retention 06/20/2011  . Degenerative disk disease 12/20/2010  . SUPERFICIAL PHLEBITIS 11/18/2008  . GROSS HEMATURIA 11/18/2008    Past Surgical History:  Procedure Laterality Date  . CARPAL TUNNEL RELEASE    . CORONARY STENT PLACEMENT    . REPLACEMENT TOTAL KNEE BILATERAL    . trigger finger surgery         Home Medications    Prior to Admission medications   Medication Sig Start Date End Date Taking? Authorizing Provider  amLODipine (NORVASC) 5 MG tablet TAKE 1 TABLET BY MOUTH DAILY 07/15/15  Yes Burnell Blanks, MD  amoxicillin (AMOXIL) 500 MG capsule Take 500 mg by mouth 3 (three) times daily. 05/13/16 05/23/16 Yes Historical Provider, MD  Azelastine HCl 0.15 % SOLN USE 2 SPRAYS NASALLY DAILY TO EACH NOSTRIL 12/24/14  Yes Historical Provider, MD  benzonatate (TESSALON) 200 MG capsule Take 200 mg by mouth 3 (three) times daily as needed for cough.   Yes Historical Provider, MD  clotrimazole (LOTRIMIN) 1 % cream Apply to affected area bid for 7 days. Patient taking differently: Apply 1 application topically 2 (two) times daily as needed (to site). Apply to affected area bid for 7 days. 05/01/16  Yes Isla Pence, MD  furosemide (  LASIX) 20 MG tablet Take 1 tablet (20 mg total) by mouth daily. 05/07/16  Yes Burnell Blanks, MD  metoprolol tartrate (LOPRESSOR) 25 MG tablet TAKE 1/2 TABLET BY MOUTH 2 TIMES DAILY 06/30/15  Yes Burnell Blanks, MD  pregabalin (LYRICA) 75 MG capsule Take 75 mg by mouth every evening.    Yes Historical Provider, MD  Red Yeast Rice Extract (RED YEAST RICE PO) Take 1,200 mg by mouth daily.   Yes Historical Provider, MD  Rivaroxaban (XARELTO) 15 MG TABS tablet Take 1 tablet (15 mg total) by mouth daily with supper. 05/07/16  Yes  Burnell Blanks, MD  nitroGLYCERIN (NITROSTAT) 0.4 MG SL tablet Place 0.4 mg under the tongue every 5 (five) minutes as needed for chest pain.    Historical Provider, MD    Family History Family History  Problem Relation Age of Onset  . Cancer Sister   . Cancer Sister     Social History Social History  Substance Use Topics  . Smoking status: Never Smoker  . Smokeless tobacco: Never Used  . Alcohol use No     Allergies   Prednisone and Rosuvastatin   Review of Systems Review of Systems  Constitutional: Positive for activity change, appetite change, chills, fatigue and fever.  HENT: Negative for congestion.   Respiratory: Positive for cough and shortness of breath.   Cardiovascular: Positive for leg swelling. Negative for chest pain and palpitations.  Gastrointestinal: Positive for nausea and vomiting. Negative for abdominal pain, constipation and diarrhea.  Genitourinary: Positive for frequency. Negative for dysuria, flank pain and hematuria.  Musculoskeletal: Positive for myalgias. Negative for back pain, neck pain and neck stiffness.  Skin: Negative for rash.  Neurological: Positive for weakness (generalized). Negative for dizziness, light-headedness, numbness and headaches.  All other systems reviewed and are negative.    Physical Exam Updated Vital Signs BP 113/63   Pulse 89   Temp 99.1 F (37.3 C) (Oral)   Resp 18   Ht 5\' 11"  (1.803 m)   Wt 174 lb (78.9 kg)   SpO2 94%   BMI 24.27 kg/m   Physical Exam  Constitutional: He is oriented to person, place, and time. He appears well-developed and well-nourished. No distress.  HENT:  Head: Normocephalic and atraumatic.  Mouth/Throat: Oropharynx is clear and moist. No oropharyngeal exudate.  Oropharynx is clear. No obvious epistaxis  Eyes: EOM are normal. Pupils are equal, round, and reactive to light.  Neck: Normal range of motion. Neck supple. No JVD present.  Cardiovascular:  Tachycardia. Irregularly  irregular  Pulmonary/Chest: Effort normal and breath sounds normal.  Few scattered rhonchi  Abdominal: Soft. Bowel sounds are normal. There is no tenderness. There is no rebound and no guarding.  Musculoskeletal: Normal range of motion. He exhibits edema. He exhibits no tenderness.  3+ pitting edema to the left lower extremity. 2+ pitting edema to the right lower extremity. 2+ dorsalis and posterior tibial pulses.  Neurological: He is alert and oriented to person, place, and time.  Moves all extremities without deficit. Sensation is fully intact.  Skin: Skin is warm and dry. Capillary refill takes less than 2 seconds. No rash noted. No erythema.  Psychiatric: He has a normal mood and affect. His behavior is normal.  Nursing note and vitals reviewed.    ED Treatments / Results  Labs (all labs ordered are listed, but only abnormal results are displayed) Labs Reviewed  CBC WITH DIFFERENTIAL/PLATELET - Abnormal; Notable for the following:  Result Value   Hemoglobin 12.2 (*)    HCT 37.4 (*)    Platelets 93 (*)    All other components within normal limits  COMPREHENSIVE METABOLIC PANEL - Abnormal; Notable for the following:    Glucose, Bld 105 (*)    BUN 33 (*)    Creatinine, Ser 1.39 (*)    Total Protein 6.4 (*)    AST 52 (*)    Total Bilirubin 1.3 (*)    GFR calc non Af Amer 43 (*)    GFR calc Af Amer 50 (*)    All other components within normal limits  URINALYSIS, ROUTINE W REFLEX MICROSCOPIC (NOT AT [facility]) - Abnormal; Notable for the following:    Hgb urine dipstick SMALL (*)    Ketones, ur 15 (*)    All other components within normal limits  URINE MICROSCOPIC-ADD ON - Abnormal; Notable for the following:    Squamous Epithelial / LPF 0-5 (*)    Bacteria, UA FEW (*)    All other components within normal limits  I-STAT TROPOININ, ED - Abnormal; Notable for the following:    Troponin i, poc 0.59 (*)    All other components within normal limits  I-STAT CG4 LACTIC ACID, ED -  Abnormal; Notable for the following:    Lactic Acid, Venous 2.13 (*)    All other components within normal limits  CULTURE, BLOOD (ROUTINE X 2)  CULTURE, BLOOD (ROUTINE X 2)  BRAIN NATRIURETIC PEPTIDE  TYPE AND SCREEN    EKG  EKG Interpretation  Date/Time:  Saturday May 15 2016 09:25:16 EDT Ventricular Rate:  102 PR Interval:    QRS Duration: 109 QT Interval:  350 QTC Calculation: 456 R Axis:   -93 Text Interpretation:  Atrial fibrillation Left anterior fascicular block Anteroseptal infarct, age indeterminate Repol abnrm suggests ischemia, lateral leads Confirmed by Lita Mains  MD, Luciann Gossett ([id number]) on 05/15/2016 9:30:23 AM       Radiology Dg Chest Port 1 View  Result Date: 05/15/2016 CLINICAL DATA:  C/o cough. Hx of sinus bradycardia, hypertension, myocardial infarction(2009), right leg DVT. Stent placement (10 years ago.) EXAM: PORTABLE CHEST 1 VIEW COMPARISON:  05/01/2016 FINDINGS: There is hazy left perihilar airspace opacity which is new from the prior exam. Lungs are otherwise clear. Lungs are hyperexpanded. No convincing pleural effusion.  No pneumothorax. Cardiac silhouette is normal in size. No mediastinal or hilar masses. No convincing adenopathy. IMPRESSION: 1. Hazy left perihilar opacity consistent with a left upper lobe pneumonia. 2. No other evidence of acute cardiopulmonary disease. Electronically Signed   By: Lajean Manes M.D.   On: 05/15/2016 09:53    Procedures Procedures (including critical care time)  Medications Ordered in ED Medications  levofloxacin (LEVAQUIN) IVPB 500 mg (0 mg Intravenous Stopped 05/15/16 1132)  sodium chloride 0.9 % bolus 250 mL (0 mLs Intravenous Stopped 05/15/16 1157)  metoprolol (LOPRESSOR) injection 2.5 mg (2.5 mg Intravenous Given 05/15/16 1133)  sodium chloride 0.9 % bolus 250 mL (0 mLs Intravenous Stopped 05/15/16 1158)     Initial Impression / Assessment and Plan / ED Course  I have reviewed the triage vital signs and the  nursing notes.  Pertinent labs & imaging results that were available during my care of the patient were reviewed by me and considered in my medical decision making (see chart for details).  Clinical Course   Daughter brings in napkins with thick sputum mixed with red blood. Discussed with Dr. Marlou Porch. Thinks elevation in troponin is likely related to  underlying illness. Recommends trending troponin and low dose of beta blocker as blood pressure will tolerate.  Discussed with internal medicine service and will see patient in the emergency department and admit. Remains hemodynamically stable. Final Clinical Impressions(s) / ED Diagnoses   Final diagnoses:  Community acquired pneumonia of left upper lobe of lung ([place])  Hemoptysis  Elevated troponin    New Prescriptions New Prescriptions   No medications on file     Julianne Rice, MD 05/15/16 1210

## 2016-05-15 NOTE — Progress Notes (Signed)
Patient alert oriented denies pain,  No shortness of breath, but still coughing up blood. Trop  level 0.91,also have fever 100.5,MD notified, will give tylenol per order. Will continue to monitor the patient.

## 2016-05-15 NOTE — H&P (Signed)
Date: 05/15/2016               Patient Name:  [name] MRN: [id number]  DOB: [birth year] Age / Sex: 82 y.o., male   PCP: Nicoletta Dress, MD         Medical Service: Internal Medicine Teaching Service         Attending Physician: Dr. Algis Greenhouse    First Contact: Dr. Asencion Partridge Pager: [phone]  Second Contact: Dr. Burgess Estelle Pager: [phone]       After Hours (After 5p/  First Contact Pager: [phone]  weekends / holidays): Second Contact Pager: [phone]   Chief Complaint: Cough, hemoptysis  History of Present Illness: [name] is an independent/active 82 y.o. gentleman with PMH CAD s/p PCI/BMS 2009, HLD, atrial fibrillation started on Xarelto 10/27 who presents for cough and hemoptysis. He developed a cough approximately three days ago with congestion, post-nasal drip, that was dry and occasionally productive of small amounts of yellow sputum. He has been in contact with several family members who have colds or bronchitis. He was experiencing fatigue but denies fevers or chills at home. He saw his PCP on Thursday and received an injection of unknown medication and was prescribed Amoxacillin and Prednisone, of which he has only taken the Amoxacillin. While coughing yesterday he brought up about a teaspoon of bright red blood, no sputum. This has persisted since then approx Q15 minutes. His last took Xarelto yesterday. He has noticed some palpitation but only on exertion. He denies shortness of breath, chest pain, dizziness, syncope, hematuria, melena, hematochezia. He has a history of DVT in 2015 and was on Xarelto for a few months at that time. He was seen in the ED in October for leg swelling, dopplers negative, resolved with addition of lasix to his medications.   In the ED, arrived afebrile, HR 107 with a-fib, RR 20, 125/82, 93% on RA with ongoing hemoptysis. CXR revealed left perihilar opacity. He was placed on 2L [place] and O2 sats range 92-96%. EKG showed  atrial fibrillation and mild ST depression in lateral leads. Labs were remarkable for Hb 12.2, platelets 93, troponin 0.59, lactic acid 2.13, BNP 355, Cr 1.39 (baseline approx 1.2). He received a dose of IV levaquin, IV metoprolol 2.5 mg, and 250cc bolus. IMTS contacted, admitting for CAP vs pulmonary hemorrhage and elevated troponin.  Of note - [name] wife died very recently and the service is planned for tomorrow afternoon.   Meds:  Current Meds  Medication Sig  . amLODipine (NORVASC) 5 MG tablet TAKE 1 TABLET BY MOUTH DAILY  . amoxicillin (AMOXIL) 500 MG capsule Take 500 mg by mouth 3 (three) times daily.  . Azelastine HCl 0.15 % SOLN USE 2 SPRAYS NASALLY DAILY TO EACH NOSTRIL  . benzonatate (TESSALON) 200 MG capsule Take 200 mg by mouth 3 (three) times daily as needed for cough.  . clotrimazole (LOTRIMIN) 1 % cream Apply to affected area bid for 7 days. (Patient taking differently: Apply 1 application topically 2 (two) times daily as needed (to site). Apply to affected area bid for 7 days.)  . furosemide (LASIX) 20 MG tablet Take 1 tablet (20 mg total) by mouth daily.  . metoprolol tartrate (LOPRESSOR) 25 MG tablet TAKE 1/2 TABLET BY MOUTH 2 TIMES DAILY  . pregabalin (LYRICA) 75 MG capsule Take 75 mg by mouth every evening.   . Red Yeast Rice Extract (RED YEAST RICE PO) Take 1,200 mg by mouth daily.  [name]  Rivaroxaban (XARELTO) 15 MG TABS tablet Take 1 tablet (15 mg total) by mouth daily with supper.    Allergies: Allergies as of 05/15/2016 - Review Complete 05/15/2016  Allergen Reaction Noted  . Prednisone Other (See Comments) 05/15/2016  . Rosuvastatin Other (See Comments) 03/19/2009   Past Medical History:  Diagnosis Date  . Coronary atherosclerosis of native coronary artery    a. Anterior STEMI 2009 s/p BMSx2 to mid & prox LAD and angioplasty to distal LAD. total RCA with collaterals, moderate Cx plaquing. a. EF 55-60% in 2013.  [name] hematuria   . HTN (hypertension)   .  Myocardial infarct 03/21/2008  . NSVT (nonsustained ventricular tachycardia) ([place])    a. Per DC summary from time of STEMI 2009.  [name] Paroxysmal atrial flutter ([place])    a. Abnl EKG 01/2012 concerning for atrial flutter, event monitor showed sinus bradycardia only and no pauses. Not felt to be a candidate for longterm anticoag due to hematuria.  . Phlebitis and thrombophlebitis of superficial vessels of lower extremities   . Pure hypercholesterolemia    a. Has not tolerated statins in the past.  . Right leg DVT ([place])    a. Dx 01/2014.  [name] Sinus bradycardia    a. By prior event monitor.  . Thrombocytopenia ([place])     Family History:  Family History  Problem Relation Age of Onset  . Cancer Sister   . Cancer Sister     Social History:  Social History   Social History  . Marital status: Married    Spouse name: N/A  . Number of children: N/A  . Years of education: N/A   Occupational History  . Not on file.   Social History Main Topics  . Smoking status: Never Smoker  . Smokeless tobacco: Never Used  . Alcohol use No  . Drug use: No  . Sexual activity: Not on file   Other Topics Concern  . Not on file   Social History Narrative   Lives in [place] w/ wife. 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 Denies tobacco, alcohol, or drug use ever in lifetime.    Review of Systems: A complete ROS was negative except as per HPI.   Physical Exam: Blood pressure 116/63, pulse 105, temperature 99.1 F (37.3 C), temperature source Oral, resp. rate 22, height 5\' 11"  (1.803 m), weight 78.9 kg (174 lb), SpO2 96 %. on 2L [place]  General appearance: Elderly gentleman resting comfortably in ER stretcher, in no distress, conversational, bedside table with numerous samples of bloody phlegm on napkins, wearing nasal cannula HENT: Normocephalic, atraumatic, moist mucous membranes, no exudate or blood visible in oropharynx, neck supple Eyes: PERRL, EOM inact,  non-icteric Cardiovascular: Tachycardic rate and irregular rhythm, no murmurs, rubs, gallops Respiratory: Rhonchi heard over left lung fields, mildly tachypneic Abdomen: BS+, soft, non-tender, non-distended Extremities: Normal bulk and range of motion, 1+ pitting edema to knees bilaterally edema, L>R, 1+ peripheral pulses Skin: Warm, dry, chronically-bruised appearing upper extremities, venous insufficiency skin changes of BLEs, cold feet Neuro: Alert and oriented, cranial nerves grossly intact Psych: Appropriate affect, clear speech, thoughts linear and goal-directed  EKG: atrial fibrillation with mild ST depression lateral leads  Dg Chest Port 1 View  Result Date: 05/15/2016 CLINICAL DATA:  C/o cough. Hx of sinus bradycardia, hypertension, myocardial infarction(2009), right leg DVT. Stent placement (10 years ago.) EXAM: PORTABLE CHEST 1 VIEW COMPARISON:  05/01/2016 FINDINGS: There is hazy left perihilar airspace opacity which is new from  the prior exam. Lungs are otherwise clear. Lungs are hyperexpanded. No convincing pleural effusion.  No pneumothorax. Cardiac silhouette is normal in size. No mediastinal or hilar masses. No convincing adenopathy. IMPRESSION: 1. Hazy left perihilar opacity consistent with a left upper lobe pneumonia. 2. No other evidence of acute cardiopulmonary disease. Electronically Signed   By: Lajean Manes M.D.   On: 05/15/2016 09:53    Assessment & Plan by Problem:  Lung infiltrate with cough and hemoptysis, with fatigue, no shortness of breath, chest pain, fevers/chills, purulent sputum, or leukocytosis. Likely pulmonary hemorrhage in setting of recently-started Xarelto, provoked by URI/cough. Cannot rule out CAP. Left-sided rhonchi and bibasilar crackles on lung auscultation. - Hold Xarelto, may restart in several days at reduced dose - Consider repeat CXR tomorrow - Influenza panel - Continue renally-dosed Levaquin - Tylenol PRN for fever/pain - Avoid blood  thinners  Elevated troponin, 0.59, no chest pain, SOB, but history of CAD s/p multiple stents, recent ongoing afib-RVR, may be consistent with demand ischemia - Trend serial trops x3 - Repeat EKG tomorrow AM - Avoid heparin  Atrial fibrillation with RVR (CHADs2VASc score: 4), tachy 100-120s on presentation, present on recent ED visits and postulated to be causing worsening of his BLE edema. - s/p IV metoprolol 2.5mg , consider re-dosing - continue home metoprolol 12.5 BID - hold Xarelto   Acute on chronic diastolic HF, Echo 3329 with LVEF 51-88%, grade 1 diastolic dysfunction, recent decline secondary to Afib with BLE edema, no signs of pulmonary edema, Cardiologist: Dr. Angelena Form  - continue home Lasix - advise to repeat echo outpatient  Acute on chronic sinus congestion, likely recent URI with worsening of previous symptoms, family contacts - continue home Azelastine spray for symptom relief  CKD3, Cr 1.39 near baseline  FEN/GI: Regular diet, replete electrolytes as needed  DVT ppx: SCDs  Code status: Full Code  Dispo: Admit patient to Observation with expected length of stay less than 2 midnights. Ideal discharge tomorrow morning.  Signed: Asencion Partridge, MD 05/15/2016, 1:54 PM  Pager: [phone]

## 2016-05-16 ENCOUNTER — Encounter (HOSPITAL_COMMUNITY): Payer: Self-pay | Admitting: Radiology

## 2016-05-16 ENCOUNTER — Observation Stay (HOSPITAL_COMMUNITY): Payer: Medicare Other

## 2016-05-16 DIAGNOSIS — R042 Hemoptysis: Secondary | ICD-10-CM | POA: Diagnosis not present

## 2016-05-16 DIAGNOSIS — I482 Chronic atrial fibrillation, unspecified: Secondary | ICD-10-CM

## 2016-05-16 DIAGNOSIS — D693 Immune thrombocytopenic purpura: Secondary | ICD-10-CM

## 2016-05-16 DIAGNOSIS — I214 Non-ST elevation (NSTEMI) myocardial infarction: Secondary | ICD-10-CM

## 2016-05-16 DIAGNOSIS — J189 Pneumonia, unspecified organism: Secondary | ICD-10-CM | POA: Diagnosis not present

## 2016-05-16 DIAGNOSIS — I5032 Chronic diastolic (congestive) heart failure: Secondary | ICD-10-CM

## 2016-05-16 DIAGNOSIS — R778 Other specified abnormalities of plasma proteins: Secondary | ICD-10-CM

## 2016-05-16 DIAGNOSIS — R7989 Other specified abnormal findings of blood chemistry: Secondary | ICD-10-CM

## 2016-05-16 HISTORY — DX: Non-ST elevation (NSTEMI) myocardial infarction: I21.4

## 2016-05-16 HISTORY — DX: Hemoptysis: R04.2

## 2016-05-16 LAB — TROPONIN I: TROPONIN I: 0.65 ng/mL — AB (ref <0.03)

## 2016-05-16 LAB — COMPREHENSIVE METABOLIC PANEL
ALT: 28 U/L (ref 17–63)
AST: 40 U/L (ref 15–41)
Albumin: 3 g/dL — ABNORMAL LOW (ref 3.5–5.0)
Alkaline Phosphatase: 48 U/L (ref 38–126)
Anion gap: 9 (ref 5–15)
BILIRUBIN TOTAL: 1.9 mg/dL — AB (ref 0.3–1.2)
BUN: 32 mg/dL — AB (ref 6–20)
CO2: 26 mmol/L (ref 22–32)
CREATININE: 1.29 mg/dL — AB (ref 0.61–1.24)
Calcium: 8.6 mg/dL — ABNORMAL LOW (ref 8.9–10.3)
Chloride: 101 mmol/L (ref 101–111)
GFR, EST AFRICAN AMERICAN: 55 mL/min — AB (ref >60)
GFR, EST NON AFRICAN AMERICAN: 47 mL/min — AB (ref >60)
Glucose, Bld: 105 mg/dL — ABNORMAL HIGH (ref 65–99)
Potassium: 3.9 mmol/L (ref 3.5–5.1)
Sodium: 136 mmol/L (ref 135–145)
TOTAL PROTEIN: 6 g/dL — AB (ref 6.5–8.1)

## 2016-05-16 LAB — CBC
HEMATOCRIT: 35.7 % — AB (ref 39.0–52.0)
HEMOGLOBIN: 11.5 g/dL — AB (ref 13.0–17.0)
MCH: 28.1 pg (ref 26.0–34.0)
MCHC: 32.2 g/dL (ref 30.0–36.0)
MCV: 87.3 fL (ref 78.0–100.0)
Platelets: 101 10*3/uL — ABNORMAL LOW (ref 150–400)
RBC: 4.09 MIL/uL — ABNORMAL LOW (ref 4.22–5.81)
RDW: 13.8 % (ref 11.5–15.5)
WBC: 8.6 10*3/uL (ref 4.0–10.5)

## 2016-05-16 LAB — PROTIME-INR
INR: 1.99
PROTHROMBIN TIME: 22.9 s — AB (ref 11.4–15.2)

## 2016-05-16 MED ORDER — RAMELTEON 8 MG PO TABS
8.0000 mg | ORAL_TABLET | Freq: Every day | ORAL | Status: DC
  Administered 2016-05-17: 8 mg via ORAL
  Filled 2016-05-16 (×3): qty 1

## 2016-05-16 MED ORDER — FUROSEMIDE 40 MG PO TABS
40.0000 mg | ORAL_TABLET | Freq: Once | ORAL | Status: AC
  Administered 2016-05-16: 40 mg via ORAL
  Filled 2016-05-16: qty 1

## 2016-05-16 MED ORDER — LEVALBUTEROL HCL 0.63 MG/3ML IN NEBU
0.6300 mg | INHALATION_SOLUTION | Freq: Once | RESPIRATORY_TRACT | Status: AC
  Administered 2016-05-16: 0.63 mg via RESPIRATORY_TRACT
  Filled 2016-05-16: qty 3

## 2016-05-16 MED ORDER — FUROSEMIDE 20 MG PO TABS
20.0000 mg | ORAL_TABLET | Freq: Every day | ORAL | Status: DC
  Administered 2016-05-16 – 2016-05-18 (×3): 20 mg via ORAL
  Filled 2016-05-16 (×3): qty 1

## 2016-05-16 MED ORDER — GUAIFENESIN ER 600 MG PO TB12
600.0000 mg | ORAL_TABLET | Freq: Once | ORAL | Status: AC
  Administered 2016-05-16: 600 mg via ORAL
  Filled 2016-05-16: qty 1

## 2016-05-16 MED ORDER — IOPAMIDOL (ISOVUE-300) INJECTION 61%
INTRAVENOUS | Status: AC
  Administered 2016-05-16: 75 mL
  Filled 2016-05-16: qty 75

## 2016-05-16 NOTE — Progress Notes (Signed)
Pt's troponin is 0.94, pt resting in bed, no c/o pain. IMTS on call made aware. No new orders made.

## 2016-05-16 NOTE — Progress Notes (Addendum)
Subjective: Improved hemoptysis, only small amounts blood now, coughing up more mucous than yesterday. Denies chest pain or shortness of breath. Troponins trending down, EKG with continued ST depression in lateral leads. SpO2 in high 80s on 2L supplemental oxygen, increased to low 90s on 3L via [place].   Objective: Vital signs in last 24 hours: Vitals:   05/15/16 1931 05/16/16 0005 05/16/16 0359 05/16/16 0904  BP: (!) 93/56 101/69 125/70 112/65  Pulse: 90 81 (!) 101 (!) 105  Resp: 20 20 20  (!) 24  Temp: 98.4 F (36.9 C) 98.2 F (36.8 C) 98.4 F (36.9 C) 98.4 F (36.9 C)  TempSrc: Oral Oral Oral Oral  SpO2: 95% 94% 90% (!) 88%  Weight:   78.9 kg (173 lb 14.4 oz)   Height:        Intake/Output Summary (Last 24 hours) at 05/16/16 1131 Last data filed at 05/16/16 1000  Gross per 24 hour  Intake              600 ml  Output              450 ml  Net              150 ml    Physical Exam General appearance: Elderly gentleman sitting in bed comfortably, conversational HENT: Normocephalic, atraumatic, oropharynx clear, neck supple, no JVD Cardiovascular: Regular rate, irregular rhythm, no murmurs, rubs, gallops Respiratory/Chest: Bibasilar crackles and coarse rhonchi bilaterally, egophony over left lung fields, normal work of breathing Abdomen: BS+ soft, non-tender, non-distended Skin: Warm, dry, intact, chronic skin changes of calves and forearms Ext: 1+ pitting edema to knees bilaterally Psych: Positive affect  Labs / Imaging / Procedures: CBC Latest Ref Rng & Units 05/16/2016 05/15/2016 05/01/2016  WBC 4.0 - 10.5 K/uL 8.6 7.2 6.2  Hemoglobin 13.0 - 17.0 g/dL 11.5(L) 12.2(L) 13.4  Hematocrit 39.0 - 52.0 % 35.7(L) 37.4(L) 40.8  Platelets 150 - 400 K/uL 101(L) 93(L) 114(L)   BMP Latest Ref Rng & Units 05/16/2016 05/15/2016 05/07/2016  Glucose 65 - 99 mg/dL 105(H) 105(H) 101(H)  BUN 6 - 20 mg/dL 32(H) 33(H) 28(H)  Creatinine 0.61 - 1.24 mg/dL 1.29(H) 1.39(H) 1.28(H)  Sodium 135 -  145 mmol/L 136 137 139  Potassium 3.5 - 5.1 mmol/L 3.9 3.7 4.3  Chloride 101 - 111 mmol/L 101 101 101  CO2 22 - 32 mmol/L 26 27 29   Calcium 8.9 - 10.3 mg/dL 8.6(L) 9.0 9.3   Dg Chest 2 View  Result Date: 05/16/2016 CLINICAL DATA:  Pneumonia. Shortness of breath. Oxygen saturation decreased. Hemoptysis. EXAM: CHEST  2 VIEW COMPARISON:  One day prior FINDINGS: Midline trachea. Mild cardiomegaly. Atherosclerosis in the transverse aorta. Small bilateral pleural effusions. Progression of perihilar and lower lobe predominant interstitial and airspace disease. IMPRESSION: Worsened aeration with increased interstitial and airspace disease. Concurrent small bilateral pleural effusions. Distribution favors pulmonary edema. Multifocal infection could look similar. Electronically Signed   By: Abigail Miyamoto M.D.   On: 05/16/2016 14:14   Ct Chest W Contrast  Result Date: 05/16/2016  IMPRESSION: Aortic atherosclerosis and coronary arterial calcification. Extensive BILATERAL central airspace infiltrates in both lungs greatest in RIGHT upper lobe,1 favoring pneumonia but could also be seen with pulmonary hemorrhage. Unable to exclude underlying mass lesions within the lungs in the setting of such widespread consolidation ; consider followup CT imaging of the chest in 3 months to ensure resolution and exclude underlying neoplasm. BILATERAL renal cysts. Electronically Signed   By: Lavonia Dana  M.D.   On: 05/16/2016 14:46    Assessment/Plan: [name] is a 82 y.o. gentleman with PMH CAD, HTN, HLD, Afib recently started on Xarelto, admitted for hemoptysis, CAP, NSTEMI.  Bilateral pneumonia, community vs healthcare-associated, cough more productive of yellow phlegm this morning, coarse rhonchi bilaterally, remains afebrile without leukocytosis but has increasing oxygen requirement, SpO2 low 90s on 2-3L via [place], influenza negative, increasing oxygen requirement prompted CXR and CT chest today which revealed  this bilateral pneumonia - Continue Levaquin 500 daily - Continue supplemental oxygen as needed, wean as tolerated - Tylenol prn for fever - Trend CBC - Follow blood cultures  NSTEMI, troponin peaked at 0.94 overnight, then trended down, EKG with ST depression in lateral leads, never had chest pain or shortness of breath although presented with Afib RVR, has a history of CAD (s/p PCI/BMS in 2009), atrial fibrillation was a recent development in Oct 2017 - Continue telemetry - Avoid heparin gtt as patient has active hemoptysis - Monitor for chest pain, low threshold for repeat EKG   Atrial fibrillation, CHADs2VASc score 4,  initially with RVR in 100s-120s - rate control with Metoprolol 12.5 mg BID - Holding Xarelto  Hemoptysis, likely secondary to CAP in setting of newly-started Xarelto and chronic mild ITP, improving, with minimal blood in sputum today, underlying neoplastic source cannot be excluded at this time, although no smoking history - Hold Xarelto - No clear pathological source in CT chest today, follow up with repeat CT in 3 months to exclude underlying neoplasm - Consider future outpatient bronchoscopy  Chronic diastolic CHF, last TTE 09/7941 with LVEF 55-60% and grade 1 diastolic dysfunction, recently with increased lower extremity swelling requiring addition of lasix 20mg  po to home meds,  Coincided with recently diagnosed with atrial fibrillation, received some IVF and developed increased oxygen requirement this AM, also with bibasilar crackles concerning for pulmonary edema, seen on CXR this afternoon - Add 1x Lasix 40mg  po - Continue lasix 20mg  daily - Daily BMP - Follow TTE  Chronic ITP, baseline approx 90-120 for years, unclear etiology. Patient may be more prone to bleeding with anticoagulation than usual. - caution with blood thinners in future  Dispo: Anticipated discharge in approximately 2-3 day(s).   LOS: 0 days   Asencion Partridge, MD 05/16/2016, 11:31 AM Pager:  [phone]

## 2016-05-16 NOTE — Progress Notes (Signed)
Patient alert and oriented, patient denies pain, mild shortness of breath, oxygen saturation on 2L is 87--88, increase oxygen to 3L. 92%/3L, pt. Still coughing up blood, lung sound congested. MD notified. No new order given. Will continue to monitor patient.

## 2016-05-17 ENCOUNTER — Observation Stay (HOSPITAL_BASED_OUTPATIENT_CLINIC_OR_DEPARTMENT_OTHER): Payer: Medicare Other

## 2016-05-17 DIAGNOSIS — I509 Heart failure, unspecified: Secondary | ICD-10-CM

## 2016-05-17 DIAGNOSIS — I110 Hypertensive heart disease with heart failure: Secondary | ICD-10-CM | POA: Diagnosis not present

## 2016-05-17 DIAGNOSIS — I214 Non-ST elevation (NSTEMI) myocardial infarction: Secondary | ICD-10-CM

## 2016-05-17 DIAGNOSIS — J189 Pneumonia, unspecified organism: Principal | ICD-10-CM

## 2016-05-17 DIAGNOSIS — I482 Chronic atrial fibrillation: Secondary | ICD-10-CM

## 2016-05-17 DIAGNOSIS — R042 Hemoptysis: Secondary | ICD-10-CM | POA: Diagnosis not present

## 2016-05-17 DIAGNOSIS — D693 Immune thrombocytopenic purpura: Secondary | ICD-10-CM

## 2016-05-17 DIAGNOSIS — I2510 Atherosclerotic heart disease of native coronary artery without angina pectoris: Secondary | ICD-10-CM

## 2016-05-17 DIAGNOSIS — Z79899 Other long term (current) drug therapy: Secondary | ICD-10-CM

## 2016-05-17 DIAGNOSIS — I5032 Chronic diastolic (congestive) heart failure: Secondary | ICD-10-CM

## 2016-05-17 LAB — BASIC METABOLIC PANEL
ANION GAP: 10 (ref 5–15)
BUN: 32 mg/dL — ABNORMAL HIGH (ref 6–20)
CALCIUM: 8.7 mg/dL — AB (ref 8.9–10.3)
CHLORIDE: 98 mmol/L — AB (ref 101–111)
CO2: 27 mmol/L (ref 22–32)
Creatinine, Ser: 1.23 mg/dL (ref 0.61–1.24)
GFR calc non Af Amer: 50 mL/min — ABNORMAL LOW (ref >60)
GFR, EST AFRICAN AMERICAN: 58 mL/min — AB (ref >60)
Glucose, Bld: 97 mg/dL (ref 65–99)
Potassium: 3.7 mmol/L (ref 3.5–5.1)
SODIUM: 135 mmol/L (ref 135–145)

## 2016-05-17 LAB — ECHOCARDIOGRAM COMPLETE
HEIGHTINCHES: 72 in
WEIGHTICAEL: 2676.8 [oz_av]

## 2016-05-17 LAB — CBC
HEMATOCRIT: 35.3 % — AB (ref 39.0–52.0)
HEMOGLOBIN: 11.4 g/dL — AB (ref 13.0–17.0)
MCH: 27.7 pg (ref 26.0–34.0)
MCHC: 32.3 g/dL (ref 30.0–36.0)
MCV: 85.7 fL (ref 78.0–100.0)
Platelets: 104 10*3/uL — ABNORMAL LOW (ref 150–400)
RBC: 4.12 MIL/uL — ABNORMAL LOW (ref 4.22–5.81)
RDW: 13.7 % (ref 11.5–15.5)
WBC: 8.7 10*3/uL (ref 4.0–10.5)

## 2016-05-17 LAB — GLUCOSE, CAPILLARY
GLUCOSE-CAPILLARY: 90 mg/dL (ref 65–99)
GLUCOSE-CAPILLARY: 98 mg/dL (ref 65–99)

## 2016-05-17 MED ORDER — PREGABALIN 75 MG PO CAPS
75.0000 mg | ORAL_CAPSULE | Freq: Two times a day (BID) | ORAL | Status: DC
  Administered 2016-05-17 – 2016-05-18 (×3): 75 mg via ORAL
  Filled 2016-05-17 (×3): qty 1

## 2016-05-17 MED ORDER — METOPROLOL TARTRATE 25 MG PO TABS
25.0000 mg | ORAL_TABLET | Freq: Two times a day (BID) | ORAL | Status: DC
  Administered 2016-05-17: 25 mg via ORAL
  Filled 2016-05-17: qty 1

## 2016-05-17 MED ORDER — METOPROLOL TARTRATE 12.5 MG HALF TABLET
12.5000 mg | ORAL_TABLET | Freq: Two times a day (BID) | ORAL | Status: DC
  Administered 2016-05-17 – 2016-05-18 (×2): 12.5 mg via ORAL
  Filled 2016-05-17 (×2): qty 1

## 2016-05-17 NOTE — Progress Notes (Signed)
SATURATION QUALIFICATIONS: (This note is used to comply with regulatory documentation for home oxygen)  Patient Saturations on Room Air at Rest = 95%  Patient Saturations on Room Air while Ambulating = 78%  Patient Saturations on 2 Liters of oxygen while Ambulating = 93%  Please briefly explain why patient needs home oxygen:

## 2016-05-17 NOTE — Progress Notes (Signed)
Subjective: [name] is sitting up and pleasantly conversational today. He feels that his breathing and cough are better. He reports his cough is drier and there is mainly trace amounts of blood his the yellow sputum that he brings up. He reports some persistent burning ankle pain that has worsened of the past days/weeks since he discontinued Celebrex. He has had several episodes of Afib-RVR to 120 bpm on telemetry this morning.   Objective: Vital signs in last 24 hours: Vitals:   05/16/16 2009 05/17/16 0002 05/17/16 0400 05/17/16 1151  BP: 122/81 109/83 115/62 (!) 93/54  Pulse: (!) 107 96 87 88  Resp: (!) 22 20 (!) 22 18  Temp: 98.8 F (37.1 C) 98.4 F (36.9 C) 98.2 F (36.8 C) 97.7 F (36.5 C)  TempSrc: Oral Oral Oral Oral  SpO2: 93% 93% 92% 95%  Weight:   75.9 kg (167 lb 4.8 oz)   Height:        Intake/Output Summary (Last 24 hours) at 05/17/16 1536 Last data filed at 05/17/16 1256  Gross per 24 hour  Intake              660 ml  Output             1680 ml  Net            -1020 ml    Physical Exam General appearance: Elderly gentleman sitting in bed comfortably, conversational, waste bin filled with napkins of sputum/blood-tinged at  bedside HENT: Normocephalic, atraumatic, oropharynx clear, neck supple, no JVD Cardiovascular: Regular rate, irregular rhythm, no murmurs, rubs, gallops Respiratory/Chest: Bibasilar crackles and mild rhonchi bilaterally, egophony over left lung fields, normal work of breathing Abdomen: BS+ soft, non-tender, non-distended Skin: Warm, dry, intact, chronic skin changes of calves and forearms Ext: 1+ pitting edema to knees bilaterally Psych: Positive affect  Labs / Imaging / Procedures: CBC Latest Ref Rng & Units 05/17/2016 05/16/2016 05/15/2016  WBC 4.0 - 10.5 K/uL 8.7 8.6 7.2  Hemoglobin 13.0 - 17.0 g/dL 11.4(L) 11.5(L) 12.2(L)  Hematocrit 39.0 - 52.0 % 35.3(L) 35.7(L) 37.4(L)  Platelets 150 - 400 K/uL 104(L) 101(L) 93(L)   BMP Latest  Ref Rng & Units 05/17/2016 05/16/2016 05/15/2016  Glucose 65 - 99 mg/dL 97 105(H) 105(H)  BUN 6 - 20 mg/dL 32(H) 32(H) 33(H)  Creatinine 0.61 - 1.24 mg/dL 1.23 1.29(H) 1.39(H)  Sodium 135 - 145 mmol/L 135 136 137  Potassium 3.5 - 5.1 mmol/L 3.7 3.9 3.7  Chloride 101 - 111 mmol/L 98(L) 101 101  CO2 22 - 32 mmol/L 27 26 27   Calcium 8.9 - 10.3 mg/dL 8.7(L) 8.6(L) 9.0   TTE 05/17/16  - Left ventricle: The cavity size was normal. Wall thickness was   increased in a pattern of moderate LVH. Systolic function was   mildly reduced. The estimated ejection fraction was in the range   of 45% to 50%. Diffuse hypokinesis. Features are consistent with   a pseudonormal left ventricular filling pattern, with concomitant   abnormal relaxation and increased filling pressure (grade 2   diastolic dysfunction). - Mitral valve: There was mild regurgitation. - Right ventricle: The cavity size was mildly dilated. Wall   thickness was normal. - Right atrium: The atrium was moderately dilated. - Tricuspid valve: There was mild regurgitation. - Pulmonary arteries: Systolic pressure was mildly increased. PA   peak pressure: 33 mm Hg (S).    Assessment/Plan: [name] is a 82 y.o. gentleman with PMH CAD, HTN, HLD,  Afib recently started on Xarelto, admitted for hemoptysis, CAP, NSTEMI.  Bilateral pneumonia, community vs healthcare-associated, cough more productive of yellow phlegm this morning, coarse rhonchi bilaterally, remains afebrile without leukocytosis but has increasing oxygen requirement, SpO2 low 90s on 2-3L via [place], influenza negative, increasing oxygen requirement prompted CXR and CT chest today which revealed this bilateral pneumonia - Levaquin 500 daily - Continue supplemental oxygen as needed, desats to 70s while ambulating on room air - May require home oxygen for activity until pneumonia resolves - Tylenol prn for fever - Trend CBC - Follow blood cultures - NGTD  NSTEMI,  troponin peaked at 0.94 then trended down, EKG with ST depression in lateral leads, never had chest pain or shortness of breath although presented with Afib RVR, has a history of CAD (s/p PCI/BMS in 2009), atrial fibrillation was a recent development in Oct 2017 - Continue telemetry - Monitor for chest pain, low threshold for repeat EKG   Atrial fibrillation, CHADs2VASc score 4,  initially with RVR in 100s-120s, ongoing 120s this morning - increase to Metoprolol 25 mg BID (some hypotension later today, decreased to 12.5 QD, may require extended release formulation) - Holding Xarelto  Hemoptysis, likely secondary to CAP in setting of newly-started Xarelto and chronic mild ITP, improving, with minimal blood in sputum today, underlying neoplastic source cannot be excluded at this time, although no smoking history - Hold Xarelto - No clear pathological source in CT chest today, follow up with repeat CT in 3 months to exclude underlying neoplasm - Consider future outpatient bronchoscopy  Chronic diastolic CHF, LVEF 03-50%, global hypokinesis and now grade 2 diastolic dysfunction, last TTE 01/2012 with LVEF 55-60% and grade 1 diastolic dysfunction, recently with increased lower extremity swelling requiring addition of lasixto home meds,  Coincided with recently diagnosed with atrial fibrillation, received some IVF and developed increased oxygen requirement this AM, also with bibasilar crackles concerning for pulmonary edema, seen on CXR this afternoon - Continue lasix 20mg  daily - Daily BMP  Chronic ITP, baseline approx 90-120 for years, unclear etiology. Patient may be more prone to bleeding with anticoagulation than usual. - caution with blood thinners in future - will obtain BMB results from [place]: Anticipated discharge in approximately 1-2 day(s).   LOS: 0 days   Asencion Partridge, MD 05/17/2016, 3:36 PM Pager: [phone]

## 2016-05-17 NOTE — Progress Notes (Signed)
Subjective: [name] has no acute events overnight. On arrival this morning, he was seated comfortably in bed in conversation with his daughter. Another of his daughters arrived shortly thereafter and was present for the interview. [name] and his family were pleasant and conversational. Patient reports that his cough has decreased in frequency, although it is still productive of yellow mucus tinged with bright red blood. He denies chest pain, palpitations, and SOB. He reports that he feels well enough to get around on his own now, but has stayed in the bed for fall prevention. He denies any history of smoking or alcohol use.  Objective: Vital signs in last 24 hours: Vitals:   05/16/16 2009 05/17/16 0002 05/17/16 0400 05/17/16 1151  BP: 122/81 109/83 115/62 (!) 93/54  Pulse: (!) 107 96 87 88  Resp: (!) 22 20 (!) 22 18  Temp: 98.8 F (37.1 C) 98.4 F (36.9 C) 98.2 F (36.8 C) 97.7 F (36.5 C)  TempSrc: Oral Oral Oral Oral  SpO2: 93% 93% 92% 95%  Weight:   75.9 kg (167 lb 4.8 oz)   Height:       Filed Weights   05/15/16 1446 05/16/16 0359 05/17/16 0400  Weight: 77.9 kg (171 lb 11.8 oz) 78.9 kg (173 lb 14.4 oz) 75.9 kg (167 lb 4.8 oz)    Intake/Output Summary (Last 24 hours) at 05/17/16 1348 Last data filed at 05/17/16 1256  Gross per 24 hour  Intake              900 ml  Output             1681 ml  Net             -781 ml   BP (!) 93/54 (BP Location: Left Arm)   Pulse 88   Temp 97.7 F (36.5 C) (Oral)   Resp 18   Ht 6' (1.829 m)   Wt 75.9 kg (167 lb 4.8 oz)   SpO2 95%   BMI 22.69 kg/m   General: Elderly male seated comfortably in bed, well-nourished, well-developed, NAD HENT: Normocephalic, atraumatic, sclera anicteric Heart: Normal rate, irregularly irregular rhythm, no murmurs, rubs, gallops heard on exam today Lungs: Normal work of breathing, continuous diffuse rhonchi bilaterally, crackles present bilaterally at lung bases Abdomen: Soft, non-tender,  non-distended, normoactive bowel sounds Skin: Warm, dry, intact, chronic skin changes of calves and forearms Ext: 1+ pretibial edema bilaterally Psych: Pleasant; feeling "much better" with congruent affect  Lab Results: CBC Latest Ref Rng & Units 05/17/2016 05/16/2016 05/15/2016  WBC 4.0 - 10.5 K/uL 8.7 8.6 7.2  Hemoglobin 13.0 - 17.0 g/dL 11.4(L) 11.5(L) 12.2(L)  Hematocrit 39.0 - 52.0 % 35.3(L) 35.7(L) 37.4(L)  Platelets 150 - 400 K/uL 104(L) 101(L) 93(L)   BMP Latest Ref Rng & Units 05/17/2016 05/16/2016 05/15/2016  Glucose 65 - 99 mg/dL 97 105(H) 105(H)  BUN 6 - 20 mg/dL 32(H) 32(H) 33(H)  Creatinine 0.61 - 1.24 mg/dL 1.23 1.29(H) 1.39(H)  Sodium 135 - 145 mmol/L 135 136 137  Potassium 3.5 - 5.1 mmol/L 3.7 3.9 3.7  Chloride 101 - 111 mmol/L 98(L) 101 101  CO2 22 - 32 mmol/L 27 26 27   Calcium 8.9 - 10.3 mg/dL 8.7(L) 8.6(L) 9.0   Micro Results: Recent Results (from the past 240 hour(s))  Culture, blood (Routine X 2) w Reflex to ID Panel     Status: None (Preliminary result)   Collection Time: 05/15/16  9:40 AM  Result Value Ref Range Status  Specimen Description BLOOD RIGHT ANTECUBITAL  Final   Special Requests BOTTLES DRAWN AEROBIC AND ANAEROBIC 5CC  Final   Culture NO GROWTH < 24 HOURS  Final   Report Status PENDING  Incomplete  Culture, blood (Routine X 2) w Reflex to ID Panel     Status: None (Preliminary result)   Collection Time: 05/15/16  9:45 AM  Result Value Ref Range Status   Specimen Description BLOOD LEFT ANTECUBITAL  Final   Special Requests BOTTLES DRAWN AEROBIC AND ANAEROBIC 10CC  Final   Culture NO GROWTH < 24 HOURS  Final   Report Status PENDING  Incomplete   Studies/Results: Dg Chest 2 View  Result Date: 05/16/2016 CLINICAL DATA:  Pneumonia. Shortness of breath. Oxygen saturation decreased. Hemoptysis. EXAM: CHEST  2 VIEW COMPARISON:  One day prior FINDINGS: Midline trachea. Mild cardiomegaly. Atherosclerosis in the transverse aorta. Small bilateral pleural  effusions. Progression of perihilar and lower lobe predominant interstitial and airspace disease. IMPRESSION: Worsened aeration with increased interstitial and airspace disease. Concurrent small bilateral pleural effusions. Distribution favors pulmonary edema. Multifocal infection could look similar. Electronically Signed   By: Abigail Miyamoto M.D.   On: 05/16/2016 14:14   Ct Chest W Contrast  Result Date: 05/16/2016 CLINICAL DATA:  Hemoptysis, weakness, cough, shortness of breath, coronary artery disease post MI and PTCA, hypertension EXAM: CT CHEST WITH CONTRAST TECHNIQUE: Multidetector CT imaging of the chest was performed during intravenous contrast administration. Sagittal and coronal MPR images reconstructed from axial data set. CONTRAST:  53mL ISOVUE-300 IOPAMIDOL (ISOVUE-300) INJECTION 61% IV. COMPARISON:  11/18/2009 ; correlation chest radiograph 05/16/2016 FINDINGS: Cardiovascular: Atherosclerotic calcifications aorta and coronary arteries. Aorta normal caliber. Pulmonary arteries grossly patent on a nondedicated exam. No pericardial effusion. Mediastinum/Nodes: Base of cervical region normal appearance. Esophagus normal appearance. Scattered normal sized mediastinal nodes without thoracic adenopathy. Lungs/Pleura: Small BILATERAL pleural effusions. Extensive central airspace infiltrates in both lungs greatest in RIGHT upper lobe. No discrete pulmonary mass identified. No pneumothorax. Upper Abdomen: Large cysts at the upper poles of the kidneys bilaterally, larger at LEFT kidney 8.4 x 6.1 cm image 175. Remaining visualized upper abdomen unremarkable. Musculoskeletal: No acute osseous findings. IMPRESSION: Aortic atherosclerosis and coronary arterial calcification. Extensive BILATERAL central airspace infiltrates in both lungs greatest in RIGHT upper lobe,1 favoring pneumonia but could also be seen with pulmonary hemorrhage. Unable to exclude underlying mass lesions within the lungs in the setting of such  widespread consolidation ; consider followup CT imaging of the chest in 3 months to ensure resolution and exclude underlying neoplasm. BILATERAL renal cysts. Electronically Signed   By: Lavonia Dana M.D.   On: 05/16/2016 14:46   Medications: I have reviewed the patient's current medications. Scheduled Meds: . amLODipine  5 mg Oral Daily  . azelastine  1 spray Each Nare BID  . furosemide  20 mg Oral Daily  . Influenza vac split quadrivalent PF  0.5 mL Intramuscular Tomorrow-1000  . levofloxacin  500 mg Oral Daily  . metoprolol tartrate  12.5 mg Oral BID  . pneumococcal 23 valent vaccine  0.5 mL Intramuscular Tomorrow-1000  . pregabalin  75 mg Oral BID  . ramelteon  8 mg Oral QHS  . sodium chloride flush  3 mL Intravenous Q12H   Continuous Infusions: PRN Meds:.acetaminophen **OR** acetaminophen, clotrimazole, ondansetron **OR** ondansetron (ZOFRAN) IV, senna-docusate Assessment/Plan: Active Problems:   CAP (community acquired pneumonia)   NSTEMI (non-ST elevated myocardial infarction) ([place])   Atrial fibrillation (HCC)   Hemoptysis   Elevated troponin  Chronic ITP (idiopathic thrombocytopenia) (HCC)   History of deep venous thrombosis (DVT) of distal vein of right lower extremity   CHF (congestive heart failure), NYHA class II, chronic, diastolic ([place])  [name] is a 82 y.o. male with history of CAD, HTN, hyperlipidemia, ad A-Fib diagnosed 04/2016 with recent initiation of Xarelto who was admitted on 11/4 for hemoptysis and is now being treated for improving CAP and monitored following likely NSTEMI.  Bilateral CAP: Patient remains afebrile without leukocytosis, although WBC uptrending to 8.7 from 7.2 on admission. Patient has increased O2 requirement, although supplemental O2 by [place] has been decreased from 3 L yesterday to 2.5 L today and patient denies SOB. Patient's cough has decreased, but is still productive of yellow phlegm mixed with bright red blood. CXR and CT  consistent with bilateral pneumonia, diffuse rhonchi and basilar crackles present bilaterally. Patient is feeling much better; will continue antibiotic therapy, observe patient for continued improvement, and wean supplemental O2 as tolerated. - Continue levofloxacin 500 mg PO daily - Wean supplemental O2 as tolerated - Tylenol 650 mg PO PRN for fever - Trend CBC - Follow blood cultures (no growth so far)  NSTEMI:  Troponin peaked at 0.94 on 11/4 and downtrended to 0.65 on 11/5. ECG on 11/5 showed ST depression in lateral leads. Patient has multiple cardiac risk factors including history of HTN , hyperlipidemia, and PCI/BMS in 2009 and presented to ED with A-Fib with rapid ventricular rate. Patient continues to deny chest pain, palpitations, or SOB. - Continue telemetry - Avoid anticoagulation therapy in setting of acute hemoptysis - Repeat ECG if patient becomes symptomatic   Atrial fibrillation:  CHADs2VASc score 4 indicating "moderate-high risk," but holding anticoagulation in setting of acute hemoptysis. Continue metoprolol at current dose as patient's BP dropped with attempted dose increase this morning. - Continue metoprolol tartrate 12.5 mg PO BID - Hold Xarelto due to acute hemoptysis  Hemoptysis: Probably related to CAP in the setting of recently started Xarelto and diagnosed ITP with chronically low platelets since 2009. Scant bright red blood present in sputum this morning, frequency of cough has decreased. CT could not exclude underlying mass lesions within lungs due to widespread consolidation, consider follow up CT chest in 3 months to rule out malignancy.  - Hold Xarelto in presence of acute hemoptysis - Repeat CT in 3 months to exclude underlying neoplastic cause of hemoptysis - Consider future outpatient bronchoscopy  Chronic diastolic CHF: TTE today showed mildly reduced systolic function with EF of 45-50% (55-60% in 2013), diffuse hypokinesis, and grade 2 diastolic  dysfunction (grade 1 in 2013). Patient also diagnosed with A-Fib at cardiology appointment on 05/07/16. Patient on home dose of Lasix 20 mg PO daily for increased bilateral LE swelling temporally associated with diagnosis of A-Fib. Increased oxygen requirement, crackles at lung bases bilaterally, and 11/5 CXR consistent with pulmonary edema.  - Continue Lasix 20 mg PO daily - Follow up daily BMP  Chronic ITP: Baseline platelet levels ~90-120 since at least 2009; unclear etiology. Patient may be more prone to bleeding with anticoagulation than usual. - Holding anticoagulation in setting of acute hemoptysis  Preventative Health Care: Patient will receive flu vaccine today and 23-valent pneumonia vaccine tomorrow.  Dispo: Anticipated discharge in approximately 1-2 day(s).   This is a Careers information officer Note.  The care of the patient was discussed with Dr. Wynetta Emery and the assessment and plan formulated with their assistance.  Please see their attached note for official documentation of the  daily encounter.   LOS: 0 days   Lawson Fiscal, Medical Student 05/17/2016, 1:48 PM

## 2016-05-17 NOTE — Care Management Obs Status (Signed)
[place] NOTIFICATION   Patient Details  Name: [name] MRN: [id number] Date of Birth: [birth year]   Medicare Observation Status Notification Given:  Yes    MayoKym Groom, RN 05/17/2016, 2:38 PM

## 2016-05-17 NOTE — Progress Notes (Signed)
*  PRELIMINARY RESULTS* Echocardiogram 2D Echocardiogram has been performed.  [name] 05/17/2016, 11:00 AM

## 2016-05-17 NOTE — Progress Notes (Signed)
Internal Medicine Attending:   I saw and examined the patient. I reviewed Dr Durenda Age note and I agree with the resident's findings and plan as documented in his note.

## 2016-05-18 DIAGNOSIS — I482 Chronic atrial fibrillation: Secondary | ICD-10-CM | POA: Diagnosis not present

## 2016-05-18 DIAGNOSIS — R042 Hemoptysis: Secondary | ICD-10-CM | POA: Diagnosis not present

## 2016-05-18 DIAGNOSIS — D693 Immune thrombocytopenic purpura: Secondary | ICD-10-CM | POA: Diagnosis not present

## 2016-05-18 DIAGNOSIS — J189 Pneumonia, unspecified organism: Secondary | ICD-10-CM | POA: Diagnosis not present

## 2016-05-18 LAB — BASIC METABOLIC PANEL
Anion gap: 6 (ref 5–15)
BUN: 39 mg/dL — AB (ref 6–20)
CALCIUM: 8.7 mg/dL — AB (ref 8.9–10.3)
CO2: 30 mmol/L (ref 22–32)
CREATININE: 1.28 mg/dL — AB (ref 0.61–1.24)
Chloride: 101 mmol/L (ref 101–111)
GFR calc Af Amer: 55 mL/min — ABNORMAL LOW (ref >60)
GFR, EST NON AFRICAN AMERICAN: 48 mL/min — AB (ref >60)
GLUCOSE: 120 mg/dL — AB (ref 65–99)
POTASSIUM: 3.4 mmol/L — AB (ref 3.5–5.1)
SODIUM: 137 mmol/L (ref 135–145)

## 2016-05-18 LAB — GLUCOSE, CAPILLARY: GLUCOSE-CAPILLARY: 117 mg/dL — AB (ref 65–99)

## 2016-05-18 LAB — CBC
HCT: 33.8 % — ABNORMAL LOW (ref 39.0–52.0)
Hemoglobin: 11 g/dL — ABNORMAL LOW (ref 13.0–17.0)
MCH: 28.1 pg (ref 26.0–34.0)
MCHC: 32.5 g/dL (ref 30.0–36.0)
MCV: 86.4 fL (ref 78.0–100.0)
PLATELETS: 105 10*3/uL — AB (ref 150–400)
RBC: 3.91 MIL/uL — AB (ref 4.22–5.81)
RDW: 13.8 % (ref 11.5–15.5)
WBC: 6.7 10*3/uL (ref 4.0–10.5)

## 2016-05-18 MED ORDER — LEVOFLOXACIN 500 MG PO TABS: 500.0000 mg | ORAL_TABLET | Freq: Every day | ORAL | 0 refills | Status: DC

## 2016-05-18 MED ORDER — PREGABALIN 75 MG PO CAPS: 75.0000 mg | ORAL_CAPSULE | Freq: Two times a day (BID) | ORAL | 0 refills | Status: DC

## 2016-05-18 MED ORDER — LEVOFLOXACIN 500 MG PO TABS: 500.0000 mg | ORAL_TABLET | ORAL | Status: DC

## 2016-05-18 NOTE — Progress Notes (Addendum)
Subjective: Mr. Allbaugh is feeling great today with no shortness of breath, cough is much less frequent and drier. Still with intermittent scant hemoptysis. Family at bedside and endorse strong support system at home, inquired about availability of incentive spirometer and home oxygen. Patient stable for discharge this afternoon.  Objective: Vital signs in last 24 hours: Vitals:   05/17/16 0400 05/17/16 1151 05/17/16 2025 05/18/16 0509  BP: 115/62 (!) 93/54 114/67 103/66  Pulse: 87 88 90 91  Resp: (!) 22 18 18 18   Temp: 98.2 F (36.8 C) 97.7 F (36.5 C) 98 F (36.7 C) 97.9 F (36.6 C)  TempSrc: Oral Oral Oral Oral  SpO2: 92% 95% 96% 94%  Weight: 75.9 kg (167 lb 4.8 oz)   75.2 kg (165 lb 12.8 oz)  Height:        Intake/Output Summary (Last 24 hours) at 05/18/16 0847 Last data filed at 05/18/16 0043  Gross per 24 hour  Intake              900 ml  Output              400 ml  Net              500 ml    Physical Exam General appearance: Elderly gentleman sitting in bed comfortably, conversational, single napkin of sputum/blood-tinged at  bedside HENT: Normocephalic, atraumatic, oropharynx clear, neck supple, no JVD Cardiovascular: Regular rate, irregular rhythm, no murmurs, rubs, gallops Respiratory/Chest: Coarse breath sounds bilaterally, normal work of breathing Abdomen: BS+ soft, non-tender, non-distended Skin: Warm, dry, intact, chronic skin changes of calves and forearms Ext: 1+ pitting edema to knees bilaterally, L>R Psych: Positive affect  Labs / Imaging / Procedures: CBC Latest Ref Rng & Units 05/18/2016 05/17/2016 05/16/2016  WBC 4.0 - 10.5 K/uL 6.7 8.7 8.6  Hemoglobin 13.0 - 17.0 g/dL 11.0(L) 11.4(L) 11.5(L)  Hematocrit 39.0 - 52.0 % 33.8(L) 35.3(L) 35.7(L)  Platelets 150 - 400 K/uL 105(L) 104(L) 101(L)   BMP Latest Ref Rng & Units 05/18/2016 05/17/2016 05/16/2016  Glucose 65 - 99 mg/dL 120(H) 97 105(H)  BUN 6 - 20 mg/dL 39(H) 32(H) 32(H)  Creatinine 0.61 - 1.24  mg/dL 1.28(H) 1.23 1.29(H)  Sodium 135 - 145 mmol/L 137 135 136  Potassium 3.5 - 5.1 mmol/L 3.4(L) 3.7 3.9  Chloride 101 - 111 mmol/L 101 98(L) 101  CO2 22 - 32 mmol/L 30 27 26   Calcium 8.9 - 10.3 mg/dL 8.7(L) 8.7(L) 8.6(L)     Assessment/Plan: Mr. TARELL SCHOLLMEYER is a 82 y.o. gentleman with PMH CAD, HTN, HLD, Afib recently started on Xarelto, admitted for hemoptysis, CAP, NSTEMI.  Bilateral pneumonia, community vs healthcare-associated, cough more productive of yellow phlegm this morning, coarse rhonchi bilaterally, remains afebrile without leukocytosis but has increasing oxygen requirement, SpO2 low 90s on 2-3L via [place], influenza negative, increasing oxygen requirement prompted CXR and CT chest today which revealed this bilateral pneumonia - Levaquin 500 daily for total 10 days - Discharged home with supplemental oxygen increase with activity until pneumonia resolves - Tylenol prn for fever - Follow blood cultures - NGTD  NSTEMI, troponin peaked at 0.94 then trended down, EKG with ST depression in lateral leads, never had chest pain or shortness of breath although presented with Afib RVR, has a history of CAD (s/p PCI/BMS in 2009), atrial fibrillation was a recent development in Oct 2017 - Continue telemetry - Monitor for chest pain, low threshold for repeat EKG   Atrial fibrillation, CHADs2VASc score 4,  initially with RVR in 100s-120s, ongoing 120s this morning -Continue holding Xarelto for 5 days, can restart if no hemoptysis - Metoprolol 12.5 BID  Hemoptysis, likely secondary to CAP in setting of newly-started Xarelto and chronic mild ITP, improving, with minimal blood in sputum today, underlying neoplastic source cannot be excluded at this time, although no smoking history -Continue holding Xarelto for 5 days, can restart if no hemoptysis - No clear pathological source in CT chest today, may follow up with repeat CT in 3 months to exclude underlying neoplasm - Consider future  outpatient bronchoscopy  Chronic diastolic CHF, LVEF 36-12%, global hypokinesis and now grade 2 diastolic dysfunction, last TTE 01/2012 with LVEF 55-60% and grade 1 diastolic dysfunction, recently with increased lower extremity swelling requiring addition of lasixto home meds,  Coincided with recently diagnosed with atrial fibrillation. Recent TTE on 11/6 with LVEF 45% to 50%. Diffuse hypokinesis and grade 2 diastolic dysfunction - Continue lasix 20mg  daily - Daily BMP  Chronic ITP, baseline approx 90-120 for years, unclear etiology. Patient may be more prone to bleeding with anticoagulation than usual. - caution with blood thinners in future - will obtain BMB results from [place]: Anticipated discharge today.   LOS: 0 days   Asencion Partridge, MD 05/18/2016, 8:47 AM Pager: [phone]

## 2016-05-18 NOTE — Progress Notes (Signed)
Internal Medicine Attending:   I saw and examined the patient. I reviewed the resident's note and I agree with the resident's findings and plan as documented in the resident's note. Patient has strong support system at home and wants to be discharged home rather than SNF.  He continues to do better every day.  We have discussed holding his Xarelto for 5 days then resuming if no further bleeding.  I have discussed bronchoscopy and patient as well as daughter would like to avoid, if further hemoptysis though I have recommended that he do obtain bronchoscopy and possible CT of chest however given his age and comorbidites I do feel it is reasonable to treat his known Pneumonia and he is responding well to antibiotics. Otherwise he is doing well today, we will ambulate him again and obtain home O2 if needed.  He can be discharged today.

## 2016-05-18 NOTE — Progress Notes (Signed)
Home oxygen ordered as requested for discharge home today. Mindi Slicker [facility] [phone]

## 2016-05-18 NOTE — Discharge Summary (Signed)
Name: [name] MRN: [id number] DOB: [birth year] 82 y.o. PCP: Alan Dress, MD  Date of Admission: 05/15/2016  9:06 AM Date of Discharge: 05/18/2016 Attending Physician: Lucious Groves, DO  Discharge Diagnosis: 1. Community acquired pneumonia 2. Hemoptysis, worsened by Xarelto 3. Atrial fibrillation with RVR 4. Chronic ITP  Active Problems:   CAP (community acquired pneumonia)   NSTEMI (non-ST elevated myocardial infarction) ([place])   Atrial fibrillation (HCC)   Hemoptysis   Elevated troponin   Chronic ITP (idiopathic thrombocytopenia) (HCC)   History of deep venous thrombosis (DVT) of distal vein of right lower extremity   CHF (congestive heart failure), NYHA class II, chronic, diastolic (HCC)   Discharge Medications:   Medication List    STOP taking these medications   amoxicillin 500 MG capsule Commonly known as:  AMOXIL   Rivaroxaban 15 MG Tabs tablet Commonly known as:  XARELTO     TAKE these medications   amLODipine 5 MG tablet Commonly known as:  NORVASC TAKE 1 TABLET BY MOUTH DAILY   Azelastine HCl 0.15 % Soln USE 2 SPRAYS NASALLY DAILY TO EACH NOSTRIL   benzonatate 200 MG capsule Commonly known as:  TESSALON Take 200 mg by mouth 3 (three) times daily as needed for cough.   clotrimazole 1 % cream Commonly known as:  LOTRIMIN Apply to affected area bid for 7 days. What changed:  how much to take  how to take this  when to take this  reasons to take this  additional instructions   furosemide 20 MG tablet Commonly known as:  LASIX Take 1 tablet (20 mg total) by mouth daily.   levofloxacin 500 MG tablet Commonly known as:  LEVAQUIN Take 1 tablet (500 mg total) by mouth daily. Start taking on:  05/19/2016   metoprolol tartrate 25 MG tablet Commonly known as:  LOPRESSOR TAKE 1/2 TABLET BY MOUTH 2 TIMES DAILY   nitroGLYCERIN 0.4 MG SL tablet Commonly known as:  NITROSTAT Place 0.4 mg under the tongue every 5 (five) minutes  as needed for chest pain.   pregabalin 75 MG capsule Commonly known as:  LYRICA Take 1 capsule (75 mg total) by mouth 2 (two) times daily. What changed:  when to take this   RED YEAST RICE PO Take 1,200 mg by mouth daily.            Durable Medical Equipment        Start     Ordered   05/18/16 1322  For home use only DME oxygen  Once    Comments:  For use to resolve shortness of breath while working and on exertion, not necessary while stationary or at rest.  Question Answer Comment  Mode or (Route) Nasal cannula   Liters per Minute 2   Frequency Continuous (stationary and portable oxygen unit needed)   Oxygen conserving device No   Oxygen delivery system Gas      05/18/16 1324      Disposition and follow-up:   [name] was discharged from [facility] in Stable condition.  At the hospital follow up visit please address:  1.  Community acquired pneumonia - ensure completion of Levaquin course, patient also provided with home oxygen for desaturations during exertion. He should no longer require this as his lungs heal.  Hemoptysis - presented with hemoptysis of bright red blood and clots on Xarelto, improved over 2-3 days after Xarelto was held. Encouraged to continue holding Xarelto for at least  5 days after discharge, then to consider restarting if hemoptysis had resolved.   Atrial fibrillation with RVR - intermittently tachycardic to the 120s on home metoprolol 12.5 mg twice a day, but resolved by day of discharge. BP was unable to tolerate increased doses of Metoprolol. Consider XR formulation Metoprolol and assess for RVR/palpitations.  NSTEMI, presented with afib-RVR and fatigue, no chest pain or shortness of breath, troponins peaked at 0.94 within 24 hours and trended down - assess for chest pain and shortness of breath  Chronic diastolic CHF - TTE revealed LVEF of 45-50 % and grade 2 diastolic dysfunction, worse than previous results in  2013, continued Lasix 20 mg by mouth daily with stable lower extremity edema - assess edema, ascites, orthopnea  Chronic ITP, platelet baseline between 90-120 for years, had BMB at [facility] with unremarkable findings - this makes him more prone to bleeding   2.  Labs / imaging needed at time of follow-up: CBC, BMP, consider CT chest in 3 months vs outpatient bronchoscopy to evaluate for malignant source of hemoptysis  3.  Pending labs/ test needing follow-up: None  Follow-up Appointments: Follow-up Information    Lupita Dawn, MD. Go on 05/27/2016.   Specialty:  Family Medicine Why:  At 11AM for hospital follow up. Contact information: [place] [id number] [phone]           Hospital Course by problem list: Active Problems:   CAP (community acquired pneumonia)   NSTEMI (non-ST elevated myocardial infarction) ([place])   Atrial fibrillation (HCC)   Hemoptysis   Elevated troponin   Chronic ITP (idiopathic thrombocytopenia) (HCC)   History of deep venous thrombosis (DVT) of distal vein of right lower extremity   CHF (congestive heart failure), NYHA class II, chronic, diastolic ([place])   1. Community acquired pneumonia, with hemoptysis [name] is an independent/active 82 y.o. gentleman with PMH CAD s/p PCI/BMS 2009, HLD, atrial fibrillation started on Xarelto 10/27 who presented on 11/4 for cough and hemoptysis. He developed a cough approximately three days prior to admission with congestion, post-nasal drip, and fatigue and CXR revealed left sided infiltrate. Empiric Levaquin was begun and [name] was held throughout his hospital stay. His hemoptysis quickly improved and his sputum became predominantly yellow. Increasing O2 requirement on 11/5 prompted chest CTwhich revealed bilateral infiltrates most consistent with pneumonia. CT was unable to exclude underlying mass lesions within the lungs in the setting of widespread consolidation. His breathing and  cough gradually improved on Levaquin, eventually only requiring supplemental oxygen on exertion by day of discharge 11/7.  2. NSTEMI, patient with history of significant CAD and presented with afib-RVR and fatigue, no chest pain or shortness of breath. Troponins peaked at 0.94 overnight then trended down with no significant EKG changes. This degree of elevation was felt to be inconsistent with simple demand ischemia, but heparin was held due to ongoing hemoptysis.  3. Atrial fibrillation with RVR - intermittently tachycardic to the 120s on home metoprolol 12.5 mg twice a day. Attempted to escalate 25 mg twice a day but experienced hypotension/orthostasis, resumed previous dose and stable at normal heart rate by discharge  Discharge Vitals:   BP 103/66 (BP Location: Left Arm)   Pulse 91   Temp 97.9 F (36.6 C) (Oral)   Resp 18   Ht 6' (1.829 m)   Wt 75.2 kg (165 lb 12.8 oz)   SpO2 94%   BMI 22.49 kg/m   Pertinent Labs, Studies, and Procedures:  CBC  Latest Ref Rng & Units 05/18/2016 05/17/2016 05/16/2016  WBC 4.0 - 10.5 K/uL 6.7 8.7 8.6  Hemoglobin 13.0 - 17.0 g/dL 11.0(L) 11.4(L) 11.5(L)  Hematocrit 39.0 - 52.0 % 33.8(L) 35.3(L) 35.7(L)  Platelets 150 - 400 K/uL 105(L) 104(L) 101(L)   BMP Latest Ref Rng & Units 05/18/2016 05/17/2016 05/16/2016  Glucose 65 - 99 mg/dL 120(H) 97 105(H)  BUN 6 - 20 mg/dL 39(H) 32(H) 32(H)  Creatinine 0.61 - 1.24 mg/dL 1.28(H) 1.23 1.29(H)  Sodium 135 - 145 mmol/L 137 135 136  Potassium 3.5 - 5.1 mmol/L 3.4(L) 3.7 3.9  Chloride 101 - 111 mmol/L 101 98(L) 101  CO2 22 - 32 mmol/L 30 27 26   Calcium 8.9 - 10.3 mg/dL 8.7(L) 8.7(L) 8.6(L)   Blood cultures x2 with no growth to date  CT Chest with contrast - 05/16/16 IMPRESSION: Aortic atherosclerosis and coronary arterial calcification.  Extensive BILATERAL central airspace infiltrates in both lungs greatest in RIGHT upper lobe,1 favoring pneumonia but could also be seen with pulmonary hemorrhage.  Unable  to exclude underlying mass lesions within the lungs in the setting of such widespread consolidation ; consider followup CT imaging of the chest in 3 months to ensure resolution and exclude underlying neoplasm. BILATERAL renal cysts.   Discharge Instructions: Discharge Instructions    Call MD for:  difficulty breathing, headache or visual disturbances    Complete by:  As directed    Call MD for:  extreme fatigue    Complete by:  As directed    Call MD for:  persistant dizziness or light-headedness    Complete by:  As directed    Call MD for:  persistant nausea and vomiting    Complete by:  As directed    Diet - low sodium heart healthy    Complete by:  As directed    Discharge instructions    Complete by:  As directed    Please continue to hold Xarelto for at least 5 days. If the bloody cough has completely resolved for 2-3 days this medication may be resumed. You may desire to wait until follow-up appointment before restarting this medication. Please take 4 more doses of the antibiotic levofloxacin, starting tomorrow, to treat your pneumonia. Please continue taking all other prescribed medications and attend hospital follow-up appointment.  If you develop worsening bleeding, high fevers, worsening cough and shortness of breath, chest pain, or any other worrisome symptoms please visit the nearest ER   Increase activity slowly    Complete by:  As directed       Signed: Asencion Partridge, MD 05/18/2016, 3:15 PM   Pager: [phone]

## 2016-05-20 LAB — CULTURE, BLOOD (ROUTINE X 2)
CULTURE: NO GROWTH
Culture: NO GROWTH

## 2016-05-26 NOTE — Progress Notes (Addendum)
Subjective:    Patient ID: [name], male    DOB: 01-17-1924, 82 y.o.   MRN: [id number]  HPI 82 y/o male presents to establish care.  Acute Concerns Wife Inez Catalina) passed away last week.  Hospital Follow up - admitted 05/15/16-05/18/16 for CAP. Reviewed Hospital Discharge Summary. Diagnosed with CAP. Discharged on Levaquin and home oxygen. Patient also had Afib with RVR that resolved prior to discharge. Elevated Troponins/NSTEMI - resolving at time of discharge.Hemoptysis - during recent hospitalization, related to Xarelto (stopped during hospitalization, consider restarting once hemoptysis has resolved, hospital team suggested repeat CT chest in 3 months to rule out malignant source)  No further hemoptysis, stopped a few days ago, having some cough, no sob at rest, but with exertion. No chest pain. Has follow up with Cardiology in 3 months.   Left leg swelling (bilateral but left greater than right) since October 21, LE doppler negative, has been out of lasix for the past few days  PMH CAD/NSTEMI Atrial Fibrillation/Flutter Chronic ITP Hx. DVT (Right leg 11/6387) Diastolic CHF  Surgical History Bilateral Total Knee Replacement PCI Carpal Tunnel Release Trigger Finger Surgery  Family History Mother and Father are deceased 2 Sisters (all deceased), 2 passed of Cancer  Allergies  Prednisone and Crestor  Medications  Norvasc 5 mg daily Azelastine nasal spray Tessalon Pearl (not taking) Clotrimazole cream Lasix 20 mg daily (not currently taking) Levaquin 500 mg  Lopressor 12.5 mg BID Nitro prn Lyrica 75mg  BID  Social Nonsmoker, married (wife recently passed away)   Review of Systems  Constitutional: Negative for chills, fatigue and fever.  Respiratory: Positive for cough. Negative for shortness of breath.   Cardiovascular: Positive for leg swelling. Negative for chest pain.  Gastrointestinal: Negative for diarrhea, nausea and vomiting.       Objective:    Physical Exam BP (!) 113/53   Pulse 89   Temp 98 F (36.7 C) (Oral)   Ht 6' (1.829 m)   Wt 180 lb (81.6 kg)   SpO2 93%   BMI 24.41 kg/m    Gen: pleasant male, NAD, accompanied by daughter HEENT: normocephalic, PERRL, EOMI, no scleral icterus, MMM, neck supple, no thyromegaly Cardiac: Irregular Irregular rate and rhythm, no murmur Resp: CTAB, normal effort Abd: soft, no tenderness, ventral hernia present, normal bowel sounds Ext: RLE 2+ edema, RLE 1+ edema, 2+ DP pulses bilaterally Skin: fungal infection of bilateral feet. Large bruise over right lower quadrant of abdomen.    LE Doppler 10/21 negative  Echo October 2017 Study Conclusions - Left ventricle: The cavity size was normal. Wall thickness was   increased in a pattern of moderate LVH. Systolic function was   mildly reduced. The estimated ejection fraction was in the range   of 45% to 50%. Diffuse hypokinesis. Features are consistent with   a pseudonormal left ventricular filling pattern, with concomitant   abnormal relaxation and increased filling pressure (grade 2   diastolic dysfunction). - Mitral valve: There was mild regurgitation. - Right ventricle: The cavity size was mildly dilated. Wall   thickness was normal. - Right atrium: The atrium was moderately dilated. - Tricuspid valve: There was mild regurgitation. - Pulmonary arteries: Systolic pressure was mildly increased. PA   peak pressure: 33 mm Hg (S).  Reviewed labs from admission.     Assessment & Plan:  CAP (community acquired pneumonia) Hospital follow up of CAP and to establish care. Recovering well.  -counseled that residual cough may take a few weeks to resolve -  return precautions discussed -needs repeat CT in January 2018 (CT unable to exclude underlying mass)  NSTEMI (non-ST elevated myocardial infarction) (HCC) Elevated Troponins/NSTEMI during hospitalization. Recovering well. -monitor for CAD symptoms  CHF (congestive heart failure),  NYHA class II, chronic, diastolic (HCC) Patient has bilateral LE edema likely due to diastolic dysfunction. -restart Lasix 20 mg daily -check basic labs -encouraged compression stockings  Atrial fibrillation (HCC) Currently rate controlled. Off Xarelto due to hemoptysis (now resolved) -counseled patient to continue to hold Xarelto but to follow up with Cardiology in the next few weeks.  -continue Metoprolol 12.5 BID  Tinea pedis Present on bilateral feet. -refill of Clotrimazole provided  Chronic ITP (idiopathic thrombocytopenia) (HCC) Needs repeat CBC today.   Hemoptysis Likely due to CAP and Xarelto however needs repeat CT in January 2018 to re-evaluate for mass.

## 2016-05-27 ENCOUNTER — Encounter: Payer: Self-pay | Admitting: Family Medicine

## 2016-05-27 ENCOUNTER — Ambulatory Visit (INDEPENDENT_AMBULATORY_CARE_PROVIDER_SITE_OTHER): Payer: Medicare Other | Admitting: Family Medicine

## 2016-05-27 DIAGNOSIS — I214 Non-ST elevation (NSTEMI) myocardial infarction: Secondary | ICD-10-CM | POA: Diagnosis not present

## 2016-05-27 DIAGNOSIS — R042 Hemoptysis: Secondary | ICD-10-CM

## 2016-05-27 DIAGNOSIS — J189 Pneumonia, unspecified organism: Secondary | ICD-10-CM

## 2016-05-27 DIAGNOSIS — I4819 Other persistent atrial fibrillation: Secondary | ICD-10-CM

## 2016-05-27 DIAGNOSIS — I481 Persistent atrial fibrillation: Secondary | ICD-10-CM

## 2016-05-27 DIAGNOSIS — J181 Lobar pneumonia, unspecified organism: Secondary | ICD-10-CM | POA: Diagnosis not present

## 2016-05-27 DIAGNOSIS — I5032 Chronic diastolic (congestive) heart failure: Secondary | ICD-10-CM

## 2016-05-27 DIAGNOSIS — B353 Tinea pedis: Secondary | ICD-10-CM | POA: Insufficient documentation

## 2016-05-27 DIAGNOSIS — D693 Immune thrombocytopenic purpura: Secondary | ICD-10-CM

## 2016-05-27 LAB — COMPLETE METABOLIC PANEL WITH GFR
ALT: 24 U/L (ref 9–46)
AST: 25 U/L (ref 10–35)
Albumin: 3.5 g/dL — ABNORMAL LOW (ref 3.6–5.1)
Alkaline Phosphatase: 55 U/L (ref 40–115)
BUN: 35 mg/dL — ABNORMAL HIGH (ref 7–25)
CO2: 27 mmol/L (ref 20–31)
Calcium: 8.8 mg/dL (ref 8.6–10.3)
Chloride: 104 mmol/L (ref 98–110)
Creat: 1.1 mg/dL (ref 0.70–1.11)
GFR, Est African American: 68 mL/min (ref >=60)
GFR, Est Non African American: 59 mL/min — ABNORMAL LOW (ref >=60)
Glucose, Bld: 75 mg/dL (ref 65–99)
Potassium: 4.2 mmol/L (ref 3.5–5.3)
Sodium: 140 mmol/L (ref 135–146)
Total Bilirubin: 1.1 mg/dL (ref 0.2–1.2)
Total Protein: 5.9 g/dL — ABNORMAL LOW (ref 6.1–8.1)

## 2016-05-27 LAB — CBC WITH DIFFERENTIAL/PLATELET
Basophils Absolute: 0 cells/uL (ref 0–200)
Basophils Relative: 0 %
Eosinophils Absolute: 106 cells/uL (ref 15–500)
Eosinophils Relative: 1 %
HCT: 35.6 % — ABNORMAL LOW (ref 38.5–50.0)
Hemoglobin: 11.5 g/dL — ABNORMAL LOW (ref 13.2–17.1)
Lymphocytes Relative: 11 %
Lymphs Abs: 1166 cells/uL (ref 850–3900)
MCH: 27.8 pg (ref 27.0–33.0)
MCHC: 32.3 g/dL (ref 32.0–36.0)
MCV: 86 fL (ref 80.0–100.0)
MPV: 10.4 fL (ref 7.5–12.5)
Monocytes Absolute: 1060 cells/uL — ABNORMAL HIGH (ref 200–950)
Monocytes Relative: 10 %
Neutro Abs: 8268 cells/uL — ABNORMAL HIGH (ref 1500–7800)
Neutrophils Relative %: 78 %
Platelets: 209 10*3/uL (ref 140–400)
RBC: 4.14 MIL/uL — ABNORMAL LOW (ref 4.20–5.80)
RDW: 14.6 % (ref 11.0–15.0)
WBC: 10.6 10*3/uL (ref 3.8–10.8)

## 2016-05-27 MED ORDER — FUROSEMIDE 20 MG PO TABS: 20.0000 mg | ORAL_TABLET | Freq: Every day | ORAL | 2 refills | Status: DC

## 2016-05-27 MED ORDER — CLOTRIMAZOLE 1 % EX CREA: 1.0000 "application " | TOPICAL_CREAM | Freq: Two times a day (BID) | CUTANEOUS | 2 refills | Status: DC | PRN

## 2016-05-27 NOTE — Patient Instructions (Addendum)
It was nice to see you today.  Continue to hold the Xarelto. Please call Dr. Julianne Handler to see if you can be seen sooner.  I have sent a refill of your clotrimazole. Apply this to your feet as prescribed.   Leg swelling - wear compression stockings daily. Restart lasix 20 mg daily.   Please return to see Dr. Ree Kida in one month.

## 2016-05-27 NOTE — Assessment & Plan Note (Signed)
Likely due to CAP and Xarelto however needs repeat CT in January 2018 to re-evaluate for mass.

## 2016-05-27 NOTE — Assessment & Plan Note (Addendum)
Hospital follow up of CAP and to establish care. Recovering well.  -counseled that residual cough may take a few weeks to resolve -return precautions discussed -needs repeat CT in January 2018 (CT unable to exclude underlying mass)

## 2016-05-27 NOTE — Assessment & Plan Note (Signed)
Needs repeat CBC today.

## 2016-05-27 NOTE — Assessment & Plan Note (Signed)
Present on bilateral feet. -refill of Clotrimazole provided

## 2016-05-27 NOTE — Assessment & Plan Note (Addendum)
Currently rate controlled. Off Xarelto due to hemoptysis (now resolved) -counseled patient to continue to hold Xarelto but to follow up with Cardiology in the next few weeks.  -continue Metoprolol 12.5 BID

## 2016-05-27 NOTE — Assessment & Plan Note (Signed)
Elevated Troponins/NSTEMI during hospitalization. Recovering well. -monitor for CAD symptoms

## 2016-05-27 NOTE — Assessment & Plan Note (Signed)
Patient has bilateral LE edema likely due to diastolic dysfunction. -restart Lasix 20 mg daily -check basic labs -encouraged compression stockings

## 2016-05-28 ENCOUNTER — Telehealth: Payer: Self-pay | Admitting: Family Medicine

## 2016-05-28 NOTE — Telephone Encounter (Signed)
Spoke to daughter [name] about lab results. Platelets are improved. Protein levels slightly low. Otherwise CBC and CMP unremarkable.

## 2016-06-03 ENCOUNTER — Other Ambulatory Visit: Payer: Self-pay | Admitting: Cardiovascular Disease

## 2016-06-14 ENCOUNTER — Other Ambulatory Visit: Payer: Self-pay | Admitting: Family Medicine

## 2016-06-14 ENCOUNTER — Encounter: Payer: Self-pay | Admitting: Family Medicine

## 2016-06-14 ENCOUNTER — Ambulatory Visit (INDEPENDENT_AMBULATORY_CARE_PROVIDER_SITE_OTHER): Payer: Medicare Other | Admitting: Family Medicine

## 2016-06-14 VITALS — BP 112/63 | HR 75 | Temp 97.6°F | Ht 72.0 in | Wt 180.4 lb

## 2016-06-14 DIAGNOSIS — R600 Localized edema: Secondary | ICD-10-CM

## 2016-06-14 DIAGNOSIS — L989 Disorder of the skin and subcutaneous tissue, unspecified: Secondary | ICD-10-CM

## 2016-06-14 MED ORDER — FUROSEMIDE 20 MG PO TABS: 40.0000 mg | ORAL_TABLET | Freq: Two times a day (BID) | ORAL | 2 refills | Status: DC

## 2016-06-14 NOTE — Progress Notes (Signed)
   Subjective: CC: LE swelling, left foot lesion [name] is a 82 y.o. male presenting to clinic today for same day appointment. PCP: Lupita Dawn, MD Concerns today include:  1. LE swelling, SOB Patient reports he has been having worsening DOE.  He notes that breathing seems to be a little bit worse when walking than when he was discharged from the hospital.  Denies cough, congestion, fevers, chills, diaphoresis, nausea, vomiting or chest pain.  He has been taking Lasix daily.  He notes that he thinks his baseline weight is about 174lb.  Does not monitor weight daily.  He notes that his LE swelling seems to be a little worse.  He is unsure that he is really urinating on the current dose of Lasix.  Additionally, he has resumed his Xarelto since last appointment.  No hemoptysis.  2. Left foot Patient reports that the left side of his foot has a little red spot on it.  He noticed this about 2 weeks ago.  He thinks that the area seems to be getting a little worse.  He notes tenderness over the area.  He has been applying an unidentified cream on the area.  No fevers, chills, exudate.  Social History Reviewed. FamHx and MedHx reviewed.  Please see EMR.  ROS: Per HPI  Objective: Office vital signs reviewed. BP 112/63   Pulse 75   Temp 97.6 F (36.4 C) (Oral)   Ht 6' (1.829 m)   Wt 180 lb 6.4 oz (81.8 kg)   SpO2 93%   BMI 24.47 kg/m   Physical Examination:  General: Awake, alert, elderly male accompanied to exam by son, No acute distress Neck: No JVD Cardio: irregularly irregular, rate normal, S1S2 heard, no murmurs appreciated Pulm: clear to auscultation bilaterally, no wheezes, rhonchi or rales, normal WOB on room air Extremities: warm,  +3 LE pitting edema to mid thigh bilaterally.  Left slightly larger than right.  Has full painless AROM of feet, Left lateral foot (near the proximal metatarsal of the fifth digit/ tarsal metatarsal joint) with a knot w/ overlying  bruise and maceration of the surrounding skin.  No TTP elsewhere along the foot.  No pain with squeeze testing.   MSK: uses cane for ambulation Skin: as above     Assessment/ Plan: 82 y.o. male   1. Bilateral lower extremity edema.  Last echo 05/17/2016 showing HFrEF w/ EF 45-50%, diffuse hypokinesis and moderate LVH.  Doubt DVT given anticoagulation.  More likely fluid overload secondary to reduced systolic function.  Will treat as mild CHF exacerbation and increase both dose and frequency of Lasix.  Oxygen WNL on room air.  No focal lung exam findings.  +3 pitting edema to bilateral thighs - Lasix 40mg  BID, discussed with son and patient - Maintain daily weight diary - Change from seated to standing positions carefully, given plan for diuresis as above - Will plan to check BMP at visit in 3 days. - Elevate LE, compression hose recommended - Strict return precautions reviewed - Follow up with me on Thursday at 215pm  2. Skin lesion of foot - Monitor closely - Moleskin recommended - May apply barrier cream if needed - Most likely will not improve without significant fluid removal as above.  Examined with Dr Mingo Amber, who agrees with my assessment and plan   Janora Norlander, DO PGY-3, [facility]

## 2016-06-14 NOTE — Patient Instructions (Signed)
For the next 3 days, I want you to take 40mg  (2 tablets) of your Furosemide (Lasix) twice daily.  Weigh yourself daily.  Be careful when getting up from a seated position, as this may make you dizzy while you are getting fluid off.  Keep your legs elevated when seated.  If your swelling worsens, your start having worsening shortness of breath or chest pain, please seek immediate medical attention.  Schedule an appointment for Thursday for reevaluation and blood labs.  For your foot lesion, you can apply a barrier cream.  I recommend that you get Moleskin to protect that area.  If it becomes worse, starts leaking fluid or you start having fevers, please seek immediate medical attention.

## 2016-06-14 NOTE — Telephone Encounter (Signed)
Pt's fluid pill was increased to 4 times a day and pt only has enough to get him through Tuesday. Pt needs a refill. Please advise. Thanks! ep

## 2016-06-17 ENCOUNTER — Ambulatory Visit
Admission: RE | Admit: 2016-06-17 | Discharge: 2016-06-17 | Disposition: A | Discharge status: Home / Self Care | Payer: Medicare Other | Source: Ambulatory Visit | Attending: Family Medicine | Admitting: Family Medicine

## 2016-06-17 ENCOUNTER — Encounter: Payer: Self-pay | Admitting: Family Medicine

## 2016-06-17 ENCOUNTER — Ambulatory Visit (INDEPENDENT_AMBULATORY_CARE_PROVIDER_SITE_OTHER): Payer: Medicare Other | Admitting: Family Medicine

## 2016-06-17 VITALS — BP 110/64 | HR 101 | Temp 97.4°F | Ht 72.0 in | Wt 174.6 lb

## 2016-06-17 DIAGNOSIS — R0609 Other forms of dyspnea: Principal | ICD-10-CM

## 2016-06-17 DIAGNOSIS — I5043 Acute on chronic combined systolic (congestive) and diastolic (congestive) heart failure: Secondary | ICD-10-CM | POA: Diagnosis not present

## 2016-06-17 DIAGNOSIS — L989 Disorder of the skin and subcutaneous tissue, unspecified: Secondary | ICD-10-CM

## 2016-06-17 MED ORDER — LISINOPRIL 2.5 MG PO TABS: 2.5000 mg | ORAL_TABLET | Freq: Every day | ORAL | 0 refills | Status: DC

## 2016-06-17 NOTE — Progress Notes (Signed)
    Subjective: CC: fluid overload/ DOE HPI: [name] is a 82 y.o. male presenting to clinic today for follow up.  He is accompanied by his daughter to today's visit. Concerns today include:  1. Fluid overload/ DOE Patient seen 06/14/2016 for fluid overload.  His lasix was increased from 20mg  daily to 40mg  BID.  He reports he has been documenting daily weights which have been: trending down.  Dyspnea on exertion has remained about the same.  He notes that the LE edema is a little better.  Denies lightheadedness, CP, LE cramping.  Endorses fatigue with exertion.  2. Left foot lesion Reports that pain has improved with application of mole skin to area.  Lesion appears to be getting smaller and softer.  No exudate, no fevers.  Social History Reviewed: non smoker. FamHx and MedHx reviewed.  Please see EMR.  ROS: Per HPI  Objective: Office vital signs reviewed. BP 110/64 (BP Location: Right Arm, Patient Position: Sitting, Cuff Size: Normal)   Pulse (!) 101   Temp 97.4 F (36.3 C) (Oral)   Ht 6' (1.829 m)   Wt 174 lb 9.6 oz (79.2 kg)   SpO2 92%   BMI 23.68 kg/m   Physical Examination:  General: Awake, alert, well nourished, No acute distress HEENT: Normal, MMM Cardio: regular rate and rhythm, S1S2 heard, no murmurs appreciated Pulm: clear to auscultation bilaterally, no wheezes, rhonchi or rales, normal WOB on room air Extremities:+3 pitting edema of LE to knees bilaterally (improving), no cyanosis or clubbing Skin: Left lateral foot (near the proximal metatarsal of the fifth digit/ tarsal metatarsal joint) with a small area of persistent swelling but that has significantly softened compared to previous exam, darkening that was appreciated at the center has now resolved.   Assessment/ Plan: 82 y.o. male   Acute on chronic combined systolic and diastolic congestive heart failure ([place]) Again, last echo 05/17/2016 showing HFrEF w/ EF 45-50%, diffuse hypokinesis and moderate  LVH, giving patient dx of combined type CHF.  Norvasc discontinued, as this can worsen LE swelling.  Lisinopril 2.5mg  qd started.  Patient to continue to monitor daily weights.  Continue Lasix 40mg  BID.  I suspect that his dry weight is actually closer to 165 lb, as this was his weight at time of discharge from hospital in November.  Will obtain BMP today to assess electrolytes and renal function.  Plan for repeat BMP at follow up with Dr Ree Kida next week, given diuresis and start of ACE-I.  CXR to be obtained as well. Strict return precautions reviewed.  Dyspnea on exertion.  I suspect that this is secondary to above, though will r/o PNA and other pulmonary processes.  At this time, nothing to suggest infection. - BASIC METABOLIC PANEL WITH GFR - DG Chest 2 View; Future  Skin lesion of foot.  Appears to have gotten somewhat better since last visit.  Persistent maceration but knot has softened and is less painful per patient.  I suspect that this will not fully resolve until patient has diuresed sufficiently. - Continue applying moleskin - Will continue to monitor  I have scheduled patient a follow up on Monday.  If he continues to improve, will plan to cancel appt and have follow up as scheduled with PCP, Dr Ree Kida next Friday.  Janora Norlander, DO PGY-3, [facility]

## 2016-06-17 NOTE — Patient Instructions (Signed)
STOP Amlodipine 5mg  START Lisinopril 2.5mg  CONTINUE taking Furosemide 40mg  twice daily  I will contact you will the results of your labs.  If anything is abnormal, I will call you.  Otherwise, expect a copy to be mailed to you.  I have made a follow up appt with me on 12/11 at 3pm.  If you are feeling well and swelling is getting better, you can cancel this appointment and just see Dr Ree Kida on Friday.   Heart Failure Heart failure means your heart has trouble pumping blood. This makes it hard for your body to work well. Heart failure is usually a long-term (chronic) condition. You must take good care of yourself and follow your doctor's treatment plan. HOME CARE  Take your heart medicine as told by your doctor.  Do not stop taking medicine unless your doctor tells you to.  Do not skip any dose of medicine.  Refill your medicines before they run out.  Take other medicines only as told by your doctor or pharmacist.  Stay active if told by your doctor. The elderly and people with severe heart failure should talk with a doctor about physical activity.  Eat heart-healthy foods. Choose foods that are without trans fat and are low in saturated fat, cholesterol, and salt (sodium). This includes fresh or frozen fruits and vegetables, fish, lean meats, fat-free or low-fat dairy foods, whole grains, and high-fiber foods. Lentils and dried peas and beans (legumes) are also good choices.  Limit salt if told by your doctor.  Cook in a healthy way. Roast, grill, broil, bake, poach, steam, or stir-fry foods.  Limit fluids as told by your doctor.  Weigh yourself every morning. Do this after you pee (urinate) and before you eat breakfast. Write down your weight to give to your doctor.  Take your blood pressure and write it down if your doctor tells you to.  Ask your doctor how to check your pulse. Check your pulse as told.  Lose weight if told by your doctor.  Stop smoking or chewing tobacco.  Do not use gum or patches that help you quit without your doctor's approval.  Schedule and go to doctor visits as told.  Nonpregnant women should have no more than 1 drink a day. Men should have no more than 2 drinks a day. Talk to your doctor about drinking alcohol.  Stop illegal drug use.  Stay current with shots (immunizations).  Manage your health conditions as told by your doctor.  Learn to manage your stress.  Rest when you are tired.  If it is really hot outside:  Avoid intense activities.  Use air conditioning or fans, or get in a cooler place.  Avoid caffeine and alcohol.  Wear loose-fitting, lightweight, and light-colored clothing.  If it is really cold outside:  Avoid intense activities.  Layer your clothing.  Wear mittens or gloves, a hat, and a scarf when going outside.  Avoid alcohol.  Learn about heart failure and get support as needed.  Get help to maintain or improve your quality of life and your ability to care for yourself as needed. GET HELP IF:   You gain weight quickly.  You are more short of breath than usual.  You cannot do your normal activities.  You tire easily.  You cough more than normal, especially with activity.  You have any or more puffiness (swelling) in areas such as your hands, feet, ankles, or belly (abdomen).  You cannot sleep because it is hard to breathe.  You feel like your heart is beating fast (palpitations).  You get dizzy or light-headed when you stand up. GET HELP RIGHT AWAY IF:   You have trouble breathing.  There is a change in mental status, such as becoming less alert or not being able to focus.  You have chest pain or discomfort.  You faint. MAKE SURE YOU:   Understand these instructions.  Will watch your condition.  Will get help right away if you are not doing well or get worse. This information is not intended to replace advice given to you by your health care provider. Make sure you  discuss any questions you have with your health care provider. Document Released: 04/06/2008 Document Revised: 07/19/2014 Document Reviewed: 08/14/2012 Elsevier Interactive Patient Education  2017 [employer].

## 2016-06-17 NOTE — Assessment & Plan Note (Signed)
Again, last echo 05/17/2016 showing HFrEF w/ EF 45-50%, diffuse hypokinesis and moderate LVH, giving patient dx of combined type CHF.  Norvasc discontinued, as this can worsen LE swelling.  Lisinopril 2.5mg  qd started.  Patient to continue to monitor daily weights.  Continue Lasix 40mg  BID.  I suspect that his dry weight is actually closer to 165 lb, as this was his weight at time of discharge from hospital in November.  Will obtain BMP today to assess electrolytes and renal function.  Plan for repeat BMP at follow up with Dr Ree Kida next week, given diuresis and start of ACE-I.  CXR to be obtained as well. Strict return precautions reviewed.

## 2016-06-18 ENCOUNTER — Other Ambulatory Visit: Payer: Self-pay | Admitting: Family Medicine

## 2016-06-18 ENCOUNTER — Telehealth: Payer: Self-pay | Admitting: Internal Medicine

## 2016-06-18 LAB — BASIC METABOLIC PANEL WITH GFR
BUN: 37 mg/dL — ABNORMAL HIGH (ref 7–25)
CHLORIDE: 99 mmol/L (ref 98–110)
CO2: 30 mmol/L (ref 20–31)
Calcium: 8.9 mg/dL (ref 8.6–10.3)
Creat: 1.42 mg/dL — ABNORMAL HIGH (ref 0.70–1.11)
GFR, Est African American: 50 mL/min — ABNORMAL LOW (ref >=60)
GFR, Est Non African American: 43 mL/min — ABNORMAL LOW (ref >=60)
Glucose, Bld: 96 mg/dL (ref 65–99)
POTASSIUM: 3.9 mmol/L (ref 3.5–5.3)
SODIUM: 140 mmol/L (ref 135–146)

## 2016-06-18 MED ORDER — FUROSEMIDE 20 MG PO TABS: 40.0000 mg | ORAL_TABLET | Freq: Two times a day (BID) | ORAL | 2 refills | Status: DC

## 2016-06-18 NOTE — Telephone Encounter (Signed)
Pt will be out of Lasix on Sunday. Pt needs a refill, but daughter states [facility] does not have the right Rx. Pt is supposed to take 80 mg a day, 40 in the am and 40 in the pm. Please advise. Thanks! ep

## 2016-06-18 NOTE — Telephone Encounter (Signed)
Reached out to daughter via telephone, no answer, left message stating prescription for lasix 40 mg BID was sent to pharmacy on 12/4.

## 2016-06-18 NOTE — Telephone Encounter (Signed)
[facility] After Hours Telephone Line   Number received via page: [phone] Person calling: Estelle Grumbles, daughter  Reason for call: Calling for Lasix order, apparently lasix that was ordered yesterday did not reach the pharmacy yesterday. Reordered Lasix 40 mg BID and confirmed with pharmacy for  Mr. Massiah Minjares.   Kerrin Mo PGY-2 [facility]

## 2016-06-21 ENCOUNTER — Ambulatory Visit: Payer: Medicare Other | Admitting: Family Medicine

## 2016-06-21 ENCOUNTER — Telehealth: Payer: Self-pay | Admitting: Family Medicine

## 2016-06-21 NOTE — Telephone Encounter (Signed)
I saw that [name] 's 3pm appt had been cancelled.  I called to check in on him.  He reports that he is feeling well this morning.  No difficulty with SOB.  He reports weight was 168lb today. This is down from 174lb when I saw him last Thursday.  Denies dizziness, CP, palpitations, LE cramping.   Thursday BMP reviewed which showed a small elevation in Cr but not quite an AKI.  His baseline Cr looks to be around 1.1-1.2.  Additionally, CXR reviewed, which showed small improvement compared to previous.  He has an appt scheduled with Dr Ree Kida on Friday.  Recommend repeating BMP, as we have been diuresing him and started ACE-I last week.   [name], DO PGY-3, [facility]

## 2016-06-21 NOTE — Telephone Encounter (Signed)
Daughter returned call. They are ok on meds at the moment.

## 2016-06-23 NOTE — Progress Notes (Signed)
   Subjective:    Patient ID: [name], male    DOB: [birth year], 82 y.o.   MRN: [id number]  HPI 82 y/o male presents for follow up of acute on chronic systolic and diastolic CHF.  CHF Seen in office on 06/17/16 and 06/14/16. Lasix increased from 20 mg daily to 40 mg BID. Breathing is much improved, swelling improved (still some up to mid shin), no dizziness, no syncope, no chest pain, wears compression stockings for peripheral edema  CAP/?lung mass Present on CT chest during last hospitalization. Needs repeat CT chest January 2018  HTN Currently prescribed  Lisinopril 2.5 mg daily, Metoprolol 12.5 mg BID  Social  Non smoker   Review of Systems  Constitutional: Negative for chills, fatigue and fever.  Respiratory: Negative for cough and shortness of breath.   Cardiovascular: Positive for leg swelling. Negative for chest pain.      Objective:   Physical Exam BP 112/76   Pulse 60   Temp 97.4 F (36.3 C) (Oral)   Ht 6' (1.829 m)   Wt 164 lb 12.8 oz (74.8 kg)   BMI 22.35 kg/m  Wt. 12/4 180 lbs Wt. 12/7 174 lbs  Gen: pleasant male, NAD Cardiac: Irregular Irregular rhythm, no murmur, no heaves/thrills Resp: CTAB, no increased work of breathing Ext: 1+ edema right leg to knee, 2+ edema left leg to knee, no calf tenderness or palpable cords bilaterally; 2+ DP right LE, difficult to palpate left DP, unable to palpate bilateral PT due to edema, 1-2 second capillary refill bilaterally     Assessment & Plan:  Acute on chronic combined systolic and diastolic congestive heart failure ([facility]) Resolving. Weight is down to 164 pounds. No respiratory symptoms. -decrease Lasix to 40 mg daily -check Cr today  HTN (hypertension) Controlled on Metoprolol and Lisinopril. Blood pressure is soft however to orthostatic symptoms. -no change in medications today  CAP (community acquired pneumonia) CT during recent hospitalization unable to exclude lung mass. -check CT chest to  rule out malignancy (scheduled in January 2018).   Sent for immunization records from Dr. Delena Bali in [place].

## 2016-06-25 ENCOUNTER — Ambulatory Visit (INDEPENDENT_AMBULATORY_CARE_PROVIDER_SITE_OTHER): Payer: Medicare Other | Admitting: Family Medicine

## 2016-06-25 ENCOUNTER — Ambulatory Visit (INDEPENDENT_AMBULATORY_CARE_PROVIDER_SITE_OTHER): Payer: Medicare Other | Admitting: Cardiology

## 2016-06-25 ENCOUNTER — Encounter: Payer: Self-pay | Admitting: Family Medicine

## 2016-06-25 ENCOUNTER — Ambulatory Visit: Payer: Medicare Other | Admitting: Cardiology

## 2016-06-25 ENCOUNTER — Encounter: Payer: Self-pay | Admitting: Cardiology

## 2016-06-25 VITALS — BP 100/50 | HR 90 | Ht 71.0 in | Wt 166.0 lb

## 2016-06-25 DIAGNOSIS — I5043 Acute on chronic combined systolic (congestive) and diastolic (congestive) heart failure: Secondary | ICD-10-CM

## 2016-06-25 DIAGNOSIS — I10 Essential (primary) hypertension: Secondary | ICD-10-CM

## 2016-06-25 DIAGNOSIS — I5042 Chronic combined systolic (congestive) and diastolic (congestive) heart failure: Secondary | ICD-10-CM | POA: Diagnosis not present

## 2016-06-25 DIAGNOSIS — J189 Pneumonia, unspecified organism: Secondary | ICD-10-CM

## 2016-06-25 DIAGNOSIS — J181 Lobar pneumonia, unspecified organism: Secondary | ICD-10-CM | POA: Diagnosis not present

## 2016-06-25 LAB — BASIC METABOLIC PANEL WITH GFR
BUN: 36 mg/dL — ABNORMAL HIGH (ref 7–25)
CALCIUM: 9.2 mg/dL (ref 8.6–10.3)
CHLORIDE: 100 mmol/L (ref 98–110)
CO2: 29 mmol/L (ref 20–31)
CREATININE: 1.47 mg/dL — AB (ref 0.70–1.11)
GFR, EST NON AFRICAN AMERICAN: 41 mL/min — AB (ref >=60)
GFR, Est African American: 48 mL/min — ABNORMAL LOW (ref >=60)
GLUCOSE: 74 mg/dL (ref 65–99)
Potassium: 3.7 mmol/L (ref 3.5–5.3)
Sodium: 142 mmol/L (ref 135–146)

## 2016-06-25 MED ORDER — ASPIRIN 81 MG PO TABS: 81.0000 mg | ORAL_TABLET | Freq: Every day | ORAL | 0 refills | Status: DC

## 2016-06-25 MED ORDER — FUROSEMIDE 20 MG PO TABS: 40.0000 mg | ORAL_TABLET | Freq: Every day | ORAL | 2 refills | Status: DC

## 2016-06-25 NOTE — Assessment & Plan Note (Signed)
CT during recent hospitalization unable to exclude lung mass. -check CT chest to rule out malignancy (scheduled in January 2018).

## 2016-06-25 NOTE — Patient Instructions (Signed)
It was nice to see you today.  Fluid pill - decrease to 2 tablets in the morning.   Dr. Ree Kida will call you with your lab results.  RN will schedule CT scan of chest in January.

## 2016-06-25 NOTE — Assessment & Plan Note (Signed)
Resolving. Weight is down to 164 pounds. No respiratory symptoms. -decrease Lasix to 40 mg daily -check Cr today

## 2016-06-25 NOTE — Assessment & Plan Note (Signed)
Controlled on Metoprolol and Lisinopril. Blood pressure is soft however to orthostatic symptoms. -no change in medications today

## 2016-06-25 NOTE — Patient Instructions (Signed)
Medication Instructions:  Your physician has recommended you make the following change in your medication:  1. Restart Asprin (81 mg ) daily   Labwork: -None  Testing/Procedures: -None  Follow-Up: Your physician recommends that you keep your scheduled  follow-up appointment with Dr. Angelena Form.   Any Other Special Instructions Will Be Listed Below (If Applicable).     If you need a refill on your cardiac medications before your next appointment, please call your pharmacy.

## 2016-06-25 NOTE — Progress Notes (Signed)
06/25/2016 [name]   [birth year]  [id number]  Primary Physician Lupita Dawn, MD Primary Cardiologist: Dr. Angelena Form    Reason for Visit/CC: F/u for atrial fibrillation and combined systolic and diastolic HF  HPI:  The patient is a 82 yo male with history of CAD, HLD, atrial flutter and chronic ITP who is here today for cardiac follow up. He has been followed in the past by Dr. Lia Foyer. He is now followed by Dr. Angelena Form. His CAD has been stable. Last cath was in 2009. He had an abnormal EKG in July 2013 concerning for atrial flutter. He was seen in the EP clinic by Dr. Rayann Heman and event monitor showed sinus brady only with no pauses. He has not tolerated statins in the past. He had a DVT in July 2015 and started on Xarelto with plan to stop in November 2015. Xarelto was stopped spring 2016. He was seen in the ED in August and September 2016 for dizziness felt to be due to his muscle relaxer. Head CT and EKG were ok.  He was recently discovered to be in new onset atrial fibrillation. At his last OV with Dr. Angelena Form 05/07/16, Dr. Angelena Form stopped his ASA and restarted Xarelto 15 mg daily. He was instructed to f/u in 3 months. His f/u visit is scheduled for mid Jan. 2018.   He presents to clinic today for post hospital f/u. He was recently admitted to [facility] in November for CAP and hemoptysis. He was also found to be in acute CHF with volume overload. His weight had increased to 180 lb. He was treated with antibiotics and IV diuretics. His weight improved into the 160s. He was transitioned to PO lasix. His Xarelto was stopped due to hemoptysis and h/o chronic ITP. His Hgb had dropped from 13>>11. Echo showed EF of 45-50% with grade 2DD.   He is here today with his son. He reports that he has done well. He denies any further dyspnea. No weight gain, orthopnea or PND. He continues to have mild LEE but much improved. He wears compression stockings. He was just seen by his PCP this morning who  reduced his dose down to 20 mg BID. He has had no further hemoptysis. He is asymptomatic with his afib. No chest pain. He has been compliant with daily weights and a low sodium diet.     Current Meds  Medication Sig  . Azelastine HCl 0.15 % SOLN USE 2 SPRAYS NASALLY DAILY TO EACH NOSTRIL  . clotrimazole (LOTRIMIN) 1 % cream Apply 1 application topically 2 (two) times daily as needed (to site). Apply to affected area bid for 7 days.  . furosemide (LASIX) 20 MG tablet Take 2 tablets (40 mg total) by mouth daily.  [name] lisinopril (ZESTRIL) 2.5 MG tablet Take 1 tablet (2.5 mg total) by mouth daily.  . metoprolol tartrate (LOPRESSOR) 25 MG tablet TAKE 1/2 TABLET BY MOUTH 2 TIMES DAILY  . nitroGLYCERIN (NITROSTAT) 0.4 MG SL tablet Place 0.4 mg under the tongue every 5 (five) minutes as needed for chest pain.  . pregabalin (LYRICA) 75 MG capsule Take 1 capsule (75 mg total) by mouth 2 (two) times daily.  . Red Yeast Rice Extract (RED YEAST RICE PO) Take 1,200 mg by mouth daily.   Allergies  Allergen Reactions  . Prednisone Other (See Comments)    Does not feel right  . Rosuvastatin Other (See Comments)    Aches   Past Medical History:  Diagnosis Date  . Coronary  atherosclerosis of native coronary artery    a. Anterior STEMI 2009 s/p BMSx2 to mid & prox LAD and angioplasty to distal LAD. total RCA with collaterals, moderate Cx plaquing. a. EF 55-60% in 2013.  [name] hematuria   . Gross hematuria 11/18/2008   Qualifier: Diagnosis of  By: Alan Shark, RN, BSN, [name]    . HTN (hypertension)   . Myocardial infarct 03/21/2008  . NSVT (nonsustained ventricular tachycardia) ([place])    a. Per DC summary from time of STEMI 2009.  [name] Paroxysmal atrial flutter ([place])    a. Abnl EKG 01/2012 concerning for atrial flutter, event monitor showed sinus bradycardia only and no pauses. Not felt to be a candidate for longterm anticoag due to hematuria.  . Phlebitis and thrombophlebitis of superficial vessels of lower  extremities   . Pure hypercholesterolemia    a. Has not tolerated statins in the past.  . Right leg DVT ([place])    a. Dx 01/2014.  [name] Sinus bradycardia    a. By prior event monitor.  . Thrombocytopenia ([place])   . Urinary retention 06/20/2011   Family History  Problem Relation Age of Onset  . Cancer Sister   . Cancer Sister    Past Surgical History:  Procedure Laterality Date  . CARPAL TUNNEL RELEASE    . CORONARY STENT PLACEMENT  2009  . REPLACEMENT TOTAL KNEE BILATERAL     Left 2014 (Dr. Eddie [name]); right 2012 (Dr. Redmond [name])  . trigger finger surgery     Social History   Social History  . Marital status: Married    Spouse name: N/A  . Number of children: N/A  . Years of education: N/A   Occupational History  . Not on file.   Social History Main Topics  . Smoking status: Never Smoker  . Smokeless tobacco: Never Used  . Alcohol use No  . Drug use: No  . Sexual activity: Not on file   Other Topics Concern  . Not on file   Social History Narrative   Lives in [place] w/ wife. 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 Denies tobacco, alcohol, or drug use ever in lifetime.     Review of Systems: General: negative for chills, fever, night sweats or weight changes.  Cardiovascular: negative for chest pain, dyspnea on exertion, edema, orthopnea, palpitations, paroxysmal nocturnal dyspnea or shortness of breath Dermatological: negative for rash Respiratory: negative for cough or wheezing Urologic: negative for hematuria Abdominal: negative for nausea, vomiting, diarrhea, bright red blood per rectum, melena, or hematemesis Neurologic: negative for visual changes, syncope, or dizziness All other systems reviewed and are otherwise negative except as noted above.   Physical Exam:  Blood pressure (!) 100/50, pulse 90, height 5\' 11"  (1.803 m), weight 166 lb (75.3 kg).  General appearance: alert, cooperative and no distress Neck: no carotid bruit and no JVD Lungs:  clear to auscultation bilaterally Heart: regular rate and rhythm, S1, S2 normal, no murmur, click, rub or gallop Extremities: trace bilateral LEE Pulses: 2+ and symmetric Skin: Skin color, texture, turgor normal. No rashes or lesions Neurologic: Grossly normal  EKG not performed  ASSESSMENT AND PLAN:   1. Atrial Fibrillation: rate is controlled with metoprolol. He is asymptomatic. Xarelto recently discontinued at recent hospitalization due to hemoptysis and anemia. He also has a h/o chronic ITP. Given his bleed risk, we will continue to refrain from a/c. Restart low dose ASA, 81 mg daily.   2. Combined Systolic + Diastolic HF: recent echo  showed EF of 45-50% with grade 2DD. He has trace bilateral LEE on physical exam but otherwise is stable. No dyspnea, orthopnea or PND. Weight has remained stable at home. Continue Lasix, and BB. We discussed continuation of a low sodium diet and daily weights.   3. CAD: stable w/o chest pain.    PLAN  Keep f/u with Dr. Angelena Form in January.   Lyda Jester PA-C 06/25/2016 4:32 PM

## 2016-06-28 ENCOUNTER — Encounter: Payer: Self-pay | Admitting: Family Medicine

## 2016-07-03 ENCOUNTER — Other Ambulatory Visit: Payer: Self-pay | Admitting: Cardiovascular Disease

## 2016-07-10 ENCOUNTER — Other Ambulatory Visit: Payer: Self-pay | Admitting: Family Medicine

## 2016-07-10 DIAGNOSIS — I5043 Acute on chronic combined systolic (congestive) and diastolic (congestive) heart failure: Secondary | ICD-10-CM

## 2016-07-12 ENCOUNTER — Other Ambulatory Visit: Payer: Self-pay | Admitting: Family Medicine

## 2016-07-12 DIAGNOSIS — I5043 Acute on chronic combined systolic (congestive) and diastolic (congestive) heart failure: Secondary | ICD-10-CM

## 2016-07-19 ENCOUNTER — Ambulatory Visit (HOSPITAL_COMMUNITY)
Admission: RE | Admit: 2016-07-19 | Discharge: 2016-07-19 | Disposition: A | Discharge status: Home / Self Care | Payer: Medicare Other | Source: Ambulatory Visit | Attending: Family Medicine | Admitting: Family Medicine

## 2016-07-19 DIAGNOSIS — I2510 Atherosclerotic heart disease of native coronary artery without angina pectoris: Secondary | ICD-10-CM | POA: Insufficient documentation

## 2016-07-19 DIAGNOSIS — I517 Cardiomegaly: Secondary | ICD-10-CM | POA: Insufficient documentation

## 2016-07-19 DIAGNOSIS — I700 Atherosclerosis of aorta: Secondary | ICD-10-CM | POA: Insufficient documentation

## 2016-07-19 DIAGNOSIS — M47814 Spondylosis without myelopathy or radiculopathy, thoracic region: Secondary | ICD-10-CM | POA: Diagnosis not present

## 2016-07-19 DIAGNOSIS — R918 Other nonspecific abnormal finding of lung field: Secondary | ICD-10-CM | POA: Diagnosis not present

## 2016-07-19 DIAGNOSIS — Z09 Encounter for follow-up examination after completed treatment for conditions other than malignant neoplasm: Secondary | ICD-10-CM | POA: Diagnosis not present

## 2016-07-19 DIAGNOSIS — Z8701 Personal history of pneumonia (recurrent): Secondary | ICD-10-CM | POA: Diagnosis not present

## 2016-07-19 DIAGNOSIS — J189 Pneumonia, unspecified organism: Secondary | ICD-10-CM

## 2016-07-19 DIAGNOSIS — J181 Lobar pneumonia, unspecified organism: Secondary | ICD-10-CM

## 2016-07-19 MED ORDER — IOPAMIDOL (ISOVUE-300) INJECTION 61%
INTRAVENOUS | Status: AC
  Administered 2016-07-19: 75 mL via INTRAVENOUS
  Filled 2016-07-19: qty 75

## 2016-07-21 ENCOUNTER — Encounter: Payer: Self-pay | Admitting: Family Medicine

## 2016-07-21 DIAGNOSIS — R9389 Abnormal findings on diagnostic imaging of other specified body structures: Secondary | ICD-10-CM | POA: Insufficient documentation

## 2016-07-21 HISTORY — DX: Abnormal findings on diagnostic imaging of other specified body structures: R93.89

## 2016-07-30 ENCOUNTER — Ambulatory Visit (INDEPENDENT_AMBULATORY_CARE_PROVIDER_SITE_OTHER): Payer: Medicare Other | Admitting: Cardiovascular Disease

## 2016-07-30 ENCOUNTER — Encounter: Payer: Self-pay | Admitting: Cardiovascular Disease

## 2016-07-30 ENCOUNTER — Ambulatory Visit: Payer: Medicare Other | Admitting: Cardiovascular Disease

## 2016-07-30 VITALS — BP 140/70 | HR 94 | Ht 70.0 in | Wt 170.0 lb

## 2016-07-30 DIAGNOSIS — I5042 Chronic combined systolic (congestive) and diastolic (congestive) heart failure: Secondary | ICD-10-CM

## 2016-07-30 DIAGNOSIS — I2510 Atherosclerotic heart disease of native coronary artery without angina pectoris: Secondary | ICD-10-CM

## 2016-07-30 DIAGNOSIS — E7800 Pure hypercholesterolemia, unspecified: Secondary | ICD-10-CM

## 2016-07-30 DIAGNOSIS — I481 Persistent atrial fibrillation: Secondary | ICD-10-CM

## 2016-07-30 DIAGNOSIS — I4819 Other persistent atrial fibrillation: Secondary | ICD-10-CM

## 2016-07-30 NOTE — Progress Notes (Signed)
Chief Complaint  Patient presents with  . coronary artherosclerosis     History of Present Illness: 83 yo male with history of CAD, HLD, atrial fib/flutter who is here today for cardiac follow up. He has been followed in the past by Dr. Lia Foyer. I met him for the first time in 03/07/14. Last cath 2009. Abnormal EKG in July 2013 concerning for atrial flutter. He was seen in the EP clinic by Dr. Rayann Heman and event monitor showed sinus brady only with no pauses. He has not tolerated statins in the past. DVT in July 2015 and started on Xarelto. This was stopped in spring 2016.  He was seen in the ED in August and September 2016 for dizziness felt to be due to his muscle relaxer. Head CT and EKG were ok. Seen in ED October 2017 with swollen legs. Venous dopplers negative for DVT. Treated for cellulitis and given Lasix. EKG that day with atrial fib. He was restarted on Xarelto. He was admitted to [facility] November 2017 with pneumonia, hemoptysis and CHF. He was diuresed with IV Lasix. HIs Xarelto was stopped due to hemoptysis and anemia. Echo with LVEF=45-50%.   He is here today for follow up. No chest pain or SOB. No palpitations. Occasional LE edema. Taking Lasix 20 mg daily.   Primary Care Physician: Lupita Dawn, MD  Past Medical History:  Diagnosis Date  . Coronary atherosclerosis of native coronary artery    a. Anterior STEMI 2009 s/p BMSx2 to mid & prox LAD and angioplasty to distal LAD. total RCA with collaterals, moderate Cx plaquing. a. EF 55-60% in 2013.  [name] hematuria   . Gross hematuria 11/18/2008   Qualifier: Diagnosis of  By: Owens Shark, RN, BSN, [name]    . HTN (hypertension)   . Myocardial infarct 03/21/2008  . NSVT (nonsustained ventricular tachycardia) ([place])    a. Per DC summary from time of STEMI 2009.  [name] Paroxysmal atrial flutter ([place])    a. Abnl EKG 01/2012 concerning for atrial flutter, event monitor showed sinus bradycardia only and no pauses. Not felt to be a candidate for  longterm anticoag due to hematuria.  . Phlebitis and thrombophlebitis of superficial vessels of lower extremities   . Pure hypercholesterolemia    a. Has not tolerated statins in the past.  . Right leg DVT ([place])    a. Dx 01/2014.  [name] Sinus bradycardia    a. By prior event monitor.  . Thrombocytopenia ([place])   . Urinary retention 06/20/2011    Past Surgical History:  Procedure Laterality Date  . CARPAL TUNNEL RELEASE    . CORONARY STENT PLACEMENT  2009  . REPLACEMENT TOTAL KNEE BILATERAL     Left 2014 (Dr. Eddie Dibbles); right 2012 (Dr. Redmond Pulling)  . trigger finger surgery      Current Outpatient Prescriptions  Medication Sig Dispense Refill  . aspirin 81 MG tablet Take 1 tablet (81 mg total) by mouth daily. 30 tablet 0  . Azelastine HCl 0.15 % SOLN USE 2 SPRAYS NASALLY DAILY TO EACH NOSTRIL  5  . clotrimazole (LOTRIMIN) 1 % cream Apply 1 application topically 2 (two) times daily as needed (to site). Apply to affected area bid for 7 days. 40 g 2  . furosemide (LASIX) 20 MG tablet Take 2 tablets (40 mg total) by mouth daily. 120 tablet 2  . lisinopril (PRINIVIL,ZESTRIL) 2.5 MG tablet TAKE 1 TABLET (2.5 MG TOTAL) BY MOUTH DAILY. 90 tablet 1  . metoprolol tartrate (LOPRESSOR) 25 MG  tablet TAKE 1/2 TABLET BY MOUTH 2 TIMES DAILY 30 tablet 10  . nitroGLYCERIN (NITROSTAT) 0.4 MG SL tablet Place 0.4 mg under the tongue every 5 (five) minutes as needed for chest pain.    . pregabalin (LYRICA) 75 MG capsule Take 1 capsule (75 mg total) by mouth 2 (two) times daily. 60 capsule 0  . Red Yeast Rice Extract (RED YEAST RICE PO) Take 1,200 mg by mouth daily.     No current facility-administered medications for this visit.     Allergies  Allergen Reactions  . Prednisone Other (See Comments)    Does not feel right  . Rosuvastatin Other (See Comments)    Aches    Social History   Social History  . Marital status: Married    Spouse name: N/A  . Number of children: N/A  . Years of education: N/A    Occupational History  . Not on file.   Social History Main Topics  . Smoking status: Never Smoker  . Smokeless tobacco: Never Used  . Alcohol use No  . Drug use: No  . Sexual activity: Not on file   Other Topics Concern  . Not on file   Social History Narrative   Lives in [place] w/ wife. 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 Denies tobacco, alcohol, or drug use ever in lifetime.    Family History  Problem Relation Age of Onset  . Cancer Sister   . Cancer Sister     Review of Systems:  As stated in the HPI and otherwise negative.   BP 140/70   Pulse 94   Ht 5' 10"  (1.778 m)   Wt 170 lb (77.1 kg)   BMI 24.39 kg/m   Physical Examination: General: Well developed, well nourished, NAD  HEENT: OP clear, mucus membranes moist  SKIN: warm, dry. No rashes. Neuro: No focal deficits  Musculoskeletal: Muscle strength 5/5 all ext  Psychiatric: Mood and affect normal  Neck: No JVD, no carotid bruits, no thyromegaly, no lymphadenopathy.  Lungs:Clear bilaterally, no wheezes, rhonci, crackles Cardiovascular: Irreg irerg with no murmurs, gallops or rubs. Abdomen:Soft. Bowel sounds present. Non-tender.  Extremities: No lower extremity edema. Pulses are 2 + in the bilateral DP/PT.  Cardiac cath 03/21/08:  1. Ventriculography in the RAO projection reveals hypo-to-akinesis of  the anteroapical segment. The ejection fraction estimate will be  45%.  2. The left main is free of critical disease.  3. The LAD has a 60% narrowing at the septal. There is some mild  plaquing distal to this and then just after another septal, there  is a 90% narrowing, then total occlusion after a small diagonal.  The extent of disease extends into just beyond the next diagonal.  The proximal lesion was covered with a 2.5-mm stent with reduction  from 60%-0%. The mid LAD, which was the site of total occlusion  was reduced from 90% and 100% down to 0%. The distal lesion was   80%-90% in the distal vessel and reduced from 80%-90% to less than  20% residual narrowing with smooth angiographic result and good  runoff into the distal vessel.  4. The circumflex provides a large marginal branch with diffuse  segmental 60% narrowing followed by a large marginal branch. There  was a tiny subbranch with 90% narrowing and an inferior subbranch  with perhaps 60% narrowing.  5. The right coronary artery is segmentally plaqued in the midvessel  and then totally occluded after an RV branch.  This RV branch then  collateralizes the distal vessel. The distal vessel was supplied  by the LAD septals.  Echo 05/17/16: - Left ventricle: The cavity size was normal. Wall thickness was   increased in a pattern of moderate LVH. Systolic function was   mildly reduced. The estimated ejection fraction was in the range   of 45% to 50%. Diffuse hypokinesis. Features are consistent with   a pseudonormal left ventricular filling pattern, with concomitant   abnormal relaxation and increased filling pressure (grade 2   diastolic dysfunction). - Mitral valve: There was mild regurgitation. - Right ventricle: The cavity size was mildly dilated. Wall   thickness was normal. - Right atrium: The atrium was moderately dilated. - Tricuspid valve: There was mild regurgitation. - Pulmonary arteries: Systolic pressure was mildly increased. PA   peak pressure: 33 mm Hg (S).  EKG:  EKG is not ordered today. The ekg ordered today demonstrates  Recent Labs: 05/15/2016: B Natriuretic Peptide 355.6 05/27/2016: ALT 24; Hemoglobin 11.5; Platelets 209 06/25/2016: BUN 36; Creat 1.47; Potassium 3.7; Sodium 142     Wt Readings from Last 3 Encounters:  07/30/16 170 lb (77.1 kg)  06/25/16 166 lb (75.3 kg)  06/25/16 164 lb 12.8 oz (74.8 kg)     Other studies Reviewed: Additional studies/ records that were reviewed today include: . Review of the above records demonstrates:    Assessment and Plan:    1. CAD without angina: No chest pain suggestive of angina. He is back on ASA since Xarelto was stopped. He does not tolerate statins. Continue beta blocker.     2. Atrial fibrillation, persistent: No palpitations. Will continue metoprolol for rate control.  Xarelto was stopped in November 2017 due to hemoptysis and anemia with h/o ITP. Due to bleeding risk, will not restart anticoagulation. He understands his risk of stroke. We have reviewed this again today.     3. Hyperlipidemia: Followed in primary care. He has not tolerated statins.   4. Chronic combined systolic and diastolic CHF: Volume status is ok.  Will continue Lasix 20 mg daily.   Current medicines are reviewed at length with the patient today.  The patient does not have concerns regarding medicines.  The following changes have been made:  no change  Labs/ tests ordered today include:   No orders of the defined types were placed in this encounter.   Disposition:   FU with me in 6 months  Signed, Lauree Chandler, MD 07/30/2016 3:46 PM    [facility] [address], [place], [place]  [id number] Phone: [phone]; Fax: [phone]

## 2016-07-30 NOTE — Patient Instructions (Signed)

## 2016-08-09 ENCOUNTER — Emergency Department (HOSPITAL_COMMUNITY): Payer: Medicare Other

## 2016-08-09 ENCOUNTER — Observation Stay (HOSPITAL_BASED_OUTPATIENT_CLINIC_OR_DEPARTMENT_OTHER): Payer: Medicare Other

## 2016-08-09 ENCOUNTER — Inpatient Hospital Stay (HOSPITAL_COMMUNITY)
Admission: EM | Admit: 2016-08-09 | Discharge: 2016-08-11 | DRG: 065 | Disposition: A | Discharge status: Home Health | Payer: Medicare Other | Attending: Family Medicine | Admitting: Family Medicine

## 2016-08-09 ENCOUNTER — Encounter (HOSPITAL_COMMUNITY): Payer: Self-pay | Admitting: Emergency Medicine

## 2016-08-09 DIAGNOSIS — I130 Hypertensive heart and chronic kidney disease with heart failure and stage 1 through stage 4 chronic kidney disease, or unspecified chronic kidney disease: Secondary | ICD-10-CM | POA: Diagnosis present

## 2016-08-09 DIAGNOSIS — I6340 Cerebral infarction due to embolism of unspecified cerebral artery: Secondary | ICD-10-CM | POA: Diagnosis not present

## 2016-08-09 DIAGNOSIS — I252 Old myocardial infarction: Secondary | ICD-10-CM

## 2016-08-09 DIAGNOSIS — E785 Hyperlipidemia, unspecified: Secondary | ICD-10-CM | POA: Diagnosis not present

## 2016-08-09 DIAGNOSIS — G8929 Other chronic pain: Secondary | ICD-10-CM | POA: Diagnosis present

## 2016-08-09 DIAGNOSIS — I639 Cerebral infarction, unspecified: Secondary | ICD-10-CM | POA: Diagnosis not present

## 2016-08-09 DIAGNOSIS — I69392 Facial weakness following cerebral infarction: Secondary | ICD-10-CM

## 2016-08-09 DIAGNOSIS — I63411 Cerebral infarction due to embolism of right middle cerebral artery: Secondary | ICD-10-CM | POA: Diagnosis not present

## 2016-08-09 DIAGNOSIS — Z23 Encounter for immunization: Secondary | ICD-10-CM

## 2016-08-09 DIAGNOSIS — J449 Chronic obstructive pulmonary disease, unspecified: Secondary | ICD-10-CM | POA: Diagnosis present

## 2016-08-09 DIAGNOSIS — G8194 Hemiplegia, unspecified affecting left nondominant side: Secondary | ICD-10-CM | POA: Diagnosis present

## 2016-08-09 DIAGNOSIS — Z79899 Other long term (current) drug therapy: Secondary | ICD-10-CM

## 2016-08-09 DIAGNOSIS — Z96653 Presence of artificial knee joint, bilateral: Secondary | ICD-10-CM | POA: Diagnosis present

## 2016-08-09 DIAGNOSIS — I2510 Atherosclerotic heart disease of native coronary artery without angina pectoris: Secondary | ICD-10-CM | POA: Diagnosis present

## 2016-08-09 DIAGNOSIS — M7989 Other specified soft tissue disorders: Secondary | ICD-10-CM

## 2016-08-09 DIAGNOSIS — I482 Chronic atrial fibrillation: Secondary | ICD-10-CM | POA: Diagnosis present

## 2016-08-09 DIAGNOSIS — R200 Anesthesia of skin: Secondary | ICD-10-CM | POA: Diagnosis not present

## 2016-08-09 DIAGNOSIS — N183 Chronic kidney disease, stage 3 (moderate): Secondary | ICD-10-CM | POA: Diagnosis present

## 2016-08-09 DIAGNOSIS — R209 Unspecified disturbances of skin sensation: Secondary | ICD-10-CM

## 2016-08-09 DIAGNOSIS — Z86718 Personal history of other venous thrombosis and embolism: Secondary | ICD-10-CM

## 2016-08-09 DIAGNOSIS — E7800 Pure hypercholesterolemia, unspecified: Secondary | ICD-10-CM | POA: Diagnosis present

## 2016-08-09 DIAGNOSIS — Z955 Presence of coronary angioplasty implant and graft: Secondary | ICD-10-CM

## 2016-08-09 DIAGNOSIS — I998 Other disorder of circulatory system: Secondary | ICD-10-CM

## 2016-08-09 DIAGNOSIS — Z7982 Long term (current) use of aspirin: Secondary | ICD-10-CM

## 2016-08-09 DIAGNOSIS — I5042 Chronic combined systolic (congestive) and diastolic (congestive) heart failure: Secondary | ICD-10-CM | POA: Diagnosis present

## 2016-08-09 DIAGNOSIS — Z7951 Long term (current) use of inhaled steroids: Secondary | ICD-10-CM

## 2016-08-09 DIAGNOSIS — M549 Dorsalgia, unspecified: Secondary | ICD-10-CM | POA: Diagnosis present

## 2016-08-09 DIAGNOSIS — D693 Immune thrombocytopenic purpura: Secondary | ICD-10-CM | POA: Diagnosis present

## 2016-08-09 HISTORY — DX: Cerebral infarction, unspecified: I63.9

## 2016-08-09 LAB — URINALYSIS, ROUTINE W REFLEX MICROSCOPIC
Bacteria, UA: NONE SEEN
Bilirubin Urine: NEGATIVE
GLUCOSE, UA: NEGATIVE mg/dL
Hgb urine dipstick: NEGATIVE
Ketones, ur: NEGATIVE mg/dL
Nitrite: NEGATIVE
PH: 8 (ref 5.0–8.0)
PROTEIN: NEGATIVE mg/dL
SQUAMOUS EPITHELIAL / LPF: NONE SEEN
Specific Gravity, Urine: 1.013 (ref 1.005–1.030)
WBC, UA: NONE SEEN WBC/hpf (ref 0–5)

## 2016-08-09 LAB — DIFFERENTIAL
BASOS PCT: 0 %
Basophils Absolute: 0 10*3/uL (ref 0.0–0.1)
Eosinophils Absolute: 0.2 10*3/uL (ref 0.0–0.7)
Eosinophils Relative: 2 %
Lymphocytes Relative: 28 %
Lymphs Abs: 1.9 10*3/uL (ref 0.7–4.0)
MONO ABS: 0.6 10*3/uL (ref 0.1–1.0)
MONOS PCT: 10 %
NEUTROS ABS: 4 10*3/uL (ref 1.7–7.7)
Neutrophils Relative %: 60 %

## 2016-08-09 LAB — I-STAT CHEM 8, ED
BUN: 37 mg/dL — ABNORMAL HIGH (ref 6–20)
CREATININE: 1.3 mg/dL — AB (ref 0.61–1.24)
Calcium, Ion: 1.16 mmol/L (ref 1.15–1.40)
Chloride: 103 mmol/L (ref 101–111)
GLUCOSE: 104 mg/dL — AB (ref 65–99)
HCT: 44 % (ref 39.0–52.0)
HEMOGLOBIN: 15 g/dL (ref 13.0–17.0)
Potassium: 4.4 mmol/L (ref 3.5–5.1)
Sodium: 141 mmol/L (ref 135–145)
TCO2: 29 mmol/L (ref 0–100)

## 2016-08-09 LAB — COMPREHENSIVE METABOLIC PANEL
ALK PHOS: 63 U/L (ref 38–126)
ALT: 25 U/L (ref 17–63)
AST: 28 U/L (ref 15–41)
Albumin: 4.1 g/dL (ref 3.5–5.0)
Anion gap: 7 (ref 5–15)
BUN: 27 mg/dL — ABNORMAL HIGH (ref 6–20)
CALCIUM: 9.8 mg/dL (ref 8.9–10.3)
CHLORIDE: 106 mmol/L (ref 101–111)
CO2: 27 mmol/L (ref 22–32)
CREATININE: 1.32 mg/dL — AB (ref 0.61–1.24)
GFR calc Af Amer: 53 mL/min — ABNORMAL LOW (ref >60)
GFR, EST NON AFRICAN AMERICAN: 46 mL/min — AB (ref >60)
Glucose, Bld: 99 mg/dL (ref 65–99)
Potassium: 4.2 mmol/L (ref 3.5–5.1)
Sodium: 140 mmol/L (ref 135–145)
Total Bilirubin: 1.7 mg/dL — ABNORMAL HIGH (ref 0.3–1.2)
Total Protein: 7.4 g/dL (ref 6.5–8.1)

## 2016-08-09 LAB — CBG MONITORING, ED: Glucose-Capillary: 107 mg/dL — ABNORMAL HIGH (ref 65–99)

## 2016-08-09 LAB — I-STAT TROPONIN, ED: TROPONIN I, POC: 0.01 ng/mL (ref 0.00–0.08)

## 2016-08-09 LAB — CBC
HEMATOCRIT: 43.8 % (ref 39.0–52.0)
Hemoglobin: 13.9 g/dL (ref 13.0–17.0)
MCH: 26.7 pg (ref 26.0–34.0)
MCHC: 31.7 g/dL (ref 30.0–36.0)
MCV: 84.1 fL (ref 78.0–100.0)
Platelets: 121 10*3/uL — ABNORMAL LOW (ref 150–400)
RBC: 5.21 MIL/uL (ref 4.22–5.81)
RDW: 15.5 % (ref 11.5–15.5)
WBC: 6.7 10*3/uL (ref 4.0–10.5)

## 2016-08-09 LAB — APTT: aPTT: 34 seconds (ref 24–36)

## 2016-08-09 LAB — PROTIME-INR
INR: 1.1
Prothrombin Time: 14.2 seconds (ref 11.4–15.2)

## 2016-08-09 MED ORDER — ASPIRIN 81 MG PO CHEW
81.0000 mg | CHEWABLE_TABLET | Freq: Every day | ORAL | Status: DC
  Administered 2016-08-09 – 2016-08-11 (×3): 81 mg via ORAL
  Filled 2016-08-09 (×3): qty 1

## 2016-08-09 MED ORDER — NITROGLYCERIN 0.4 MG SL SUBL
0.4000 mg | SUBLINGUAL_TABLET | SUBLINGUAL | Status: DC | PRN
Start: 2016-08-09 — End: 2016-08-11

## 2016-08-09 MED ORDER — FUROSEMIDE 40 MG PO TABS: 40.0000 mg | ORAL_TABLET | Freq: Every day | ORAL | Status: DC

## 2016-08-09 MED ORDER — ACETAMINOPHEN 325 MG PO TABS: 650.0000 mg | ORAL_TABLET | ORAL | Status: DC | PRN

## 2016-08-09 MED ORDER — PREGABALIN 75 MG PO CAPS
75.0000 mg | ORAL_CAPSULE | Freq: Two times a day (BID) | ORAL | Status: DC
  Administered 2016-08-09 – 2016-08-11 (×5): 75 mg via ORAL
  Filled 2016-08-09 (×5): qty 1

## 2016-08-09 MED ORDER — ACETAMINOPHEN 650 MG RE SUPP: 650.0000 mg | RECTAL | Status: DC | PRN

## 2016-08-09 MED ORDER — FLUTICASONE PROPIONATE 50 MCG/ACT NA SUSP
2.0000 | Freq: Every day | NASAL | Status: DC
  Administered 2016-08-09 – 2016-08-11 (×3): 2 via NASAL
  Filled 2016-08-09: qty 16

## 2016-08-09 MED ORDER — SENNOSIDES-DOCUSATE SODIUM 8.6-50 MG PO TABS: 1.0000 | ORAL_TABLET | Freq: Every evening | ORAL | Status: DC | PRN

## 2016-08-09 MED ORDER — HEPARIN (PORCINE) IN NACL 100-0.45 UNIT/ML-% IJ SOLN
800.0000 [IU]/h | INTRAMUSCULAR | Status: DC
  Administered 2016-08-09: 900 [IU]/h via INTRAVENOUS
  Filled 2016-08-09 (×2): qty 250

## 2016-08-09 MED ORDER — ASPIRIN 81 MG PO CHEW
324.0000 mg | CHEWABLE_TABLET | Freq: Once | ORAL | Status: AC
  Administered 2016-08-09: 324 mg via ORAL
  Filled 2016-08-09: qty 4

## 2016-08-09 MED ORDER — SODIUM CHLORIDE 0.9 % IV BOLUS (SEPSIS)
500.0000 mL | Freq: Once | INTRAVENOUS | Status: AC
  Administered 2016-08-09: 500 mL via INTRAVENOUS

## 2016-08-09 MED ORDER — STROKE: EARLY STAGES OF RECOVERY BOOK
Freq: Once | Status: AC
  Administered 2016-08-09: 12:00:00

## 2016-08-09 MED ORDER — ACETAMINOPHEN 160 MG/5ML PO SOLN: 650.0000 mg | ORAL | Status: DC | PRN

## 2016-08-09 MED ORDER — METOPROLOL TARTRATE 12.5 MG HALF TABLET
12.5000 mg | ORAL_TABLET | Freq: Two times a day (BID) | ORAL | Status: DC
  Administered 2016-08-09: 12.5 mg via ORAL
  Filled 2016-08-09: qty 1

## 2016-08-09 NOTE — Care Management Note (Signed)
Case Management Note  Patient Details  Name: [name] MRN: [id number] Date of Birth: 31-Mar-1924  Subjective/Objective:      Patient was admitted with CVA. Lives at home, independent with ADLs.  CM will follow for discharge needs pending PT/OT evals and physician orders.               Action/Plan:    Expected Discharge Date:                  Expected Discharge Plan:     In-House Referral:     Discharge planning Services     Post Acute Care Choice:    Choice offered to:     DME Arranged:    DME Agency:     HH Arranged:    HH Agency:     Status of Service:     If discussed at [employer] of Stay Meetings, dates discussed:    Additional Comments:  Rolm Baptise, RN 08/09/2016, 10:42 AM

## 2016-08-09 NOTE — Progress Notes (Signed)
Evaluated patient concerning worsening left-sided facial droop and slurred speech per daughter this afternoon. Upon entering the room, patient was accompanied by daughter. Daughter says patient symptoms and during ambulation in the halls but quickly improved since completing. Patient states overall he feels "fine" but continues to have left upper extremity weakness and decreased sensation consistent with this morning. Neurological exam positive for mild left-sided facial droop, decrease finger to nose test left hand, minimal dysarthria. Motor strength weakness 4/5 left upper extremity, all other extremities 5/5. Weakness seems to be focal at left hand. Stroke team was contacted and recommended patient receive 500 mL bolus given patient is outside of window. Will continue monitoring neurological status at this time.  -- [name], [place], PGY-1

## 2016-08-09 NOTE — Consult Note (Addendum)
Referring Physician: Dr Harriet Butte  Patient name: [name] MRN: [id number] DOB: [birth year] Sex: male  REASON FOR CONSULT: left foot ischemia  HPI: [name] is a 83 y.o. male admitted earlier today for what is thought to be embolic stroke from afib.  Pt noted by Dr Yisroel Ramming to have left foot cooler than right this afternoon.  Pt denies any numbness or tingling in foot.  He denies any pain in the foot.  He has no history of claudication.  He was on xarelto in December but this was stopped due to hemoptysis during an episode of pneumonia.  He has not had any hemoptysis in the past month.  He currently has left facial droop and left hand weakness from acute stroke. Other medical problems include hypertension, CAD, elevated cholesterol and thrombocytopenia all of which have been stable.  He is on aspirin.  Past Medical History:  Diagnosis Date  . Coronary atherosclerosis of native coronary artery    a. Anterior STEMI 2009 s/p BMSx2 to mid & prox LAD and angioplasty to distal LAD. total RCA with collaterals, moderate Cx plaquing. a. EF 55-60% in 2013.  [name] hematuria   . Gross hematuria 11/18/2008   Qualifier: Diagnosis of  By: Owens Shark, RN, BSN, [name]    . HTN (hypertension)   . Myocardial infarct 03/21/2008  . NSVT (nonsustained ventricular tachycardia) ([place])    a. Per DC summary from time of STEMI 2009.  [name] Paroxysmal atrial flutter ([place])    a. Abnl EKG 01/2012 concerning for atrial flutter, event monitor showed sinus bradycardia only and no pauses. Not felt to be a candidate for longterm anticoag due to hematuria.  . Phlebitis and thrombophlebitis of superficial vessels of lower extremities   . Pure hypercholesterolemia    a. Has not tolerated statins in the past.  . Right leg DVT ([place])    a. Dx 01/2014.  [name] Sinus bradycardia    a. By prior event monitor.  . Thrombocytopenia ([place])   . Urinary retention 06/20/2011   Past Surgical History:  Procedure Laterality  Date  . CARPAL TUNNEL RELEASE    . CORONARY STENT PLACEMENT  2009  . REPLACEMENT TOTAL KNEE BILATERAL     Left 2014 (Dr. Eddie Dibbles); right 2012 (Dr. Redmond Pulling)  . trigger finger surgery      Family History  Problem Relation Age of Onset  . Cancer Sister   . Cancer Sister     SOCIAL HISTORY: Social History   Social History  . Marital status: Married    Spouse name: N/A  . Number of children: N/A  . Years of education: N/A   Occupational History  . Not on file.   Social History Main Topics  . Smoking status: Never Smoker  . Smokeless tobacco: Never Used  . Alcohol use No  . Drug use: No  . Sexual activity: Not on file   Other Topics Concern  . Not on file   Social History Narrative   Lives in [place] w/ wife. 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 Denies tobacco, alcohol, or drug use ever in lifetime.    Allergies  Allergen Reactions  . Prednisone Other (See Comments)    Does not feel right  . Rosuvastatin Other (See Comments)    Aches    Current Facility-Administered Medications  Medication Dose Route Frequency Provider Last Rate Last Dose  . acetaminophen (TYLENOL) tablet 650 mg  650 mg Oral Q4H PRN Arlie Solomons  Juanito Doom, MD       Or  . acetaminophen (TYLENOL) solution 650 mg  650 mg Per Tube Q4H PRN Carlyle Dolly, MD       Or  . acetaminophen (TYLENOL) suppository 650 mg  650 mg Rectal Q4H PRN Carlyle Dolly, MD      . aspirin chewable tablet 81 mg  81 mg Oral Daily Carlyle Dolly, MD   81 mg at 08/09/16 1224  . fluticasone (FLONASE) 50 MCG/ACT nasal spray 2 spray  2 spray Each Nare Daily Carlyle Dolly, MD   2 spray at 08/09/16 1225  . nitroGLYCERIN (NITROSTAT) SL tablet 0.4 mg  0.4 mg Sublingual Q5 min PRN Carlyle Dolly, MD      . pregabalin (LYRICA) capsule 75 mg  75 mg Oral BID Carlyle Dolly, MD   75 mg at 08/09/16 1223  . senna-docusate (Senokot-S) tablet 1 tablet  1 tablet Oral QHS PRN Carlyle Dolly,  MD        ROS:   General:  No weight loss, Fever, chills  HEENT: No recent headaches, no nasal bleeding, no visual changes, no sore throat  Neurologic: No dizziness, blackouts, seizures. No recent symptoms of stroke or mini- stroke. No recent episodes of slurred speech, or temporary blindness.  Cardiac: No recent episodes of chest pain/pressure, no shortness of breath at rest.  No shortness of breath with exertion.  Denies history of atrial fibrillation or irregular heartbeat  Vascular: No history of rest pain in feet.  No history of claudication.  No history of non-healing ulcer, No history of DVT   Pulmonary: No home oxygen, no productive cough, no hemoptysis,  No asthma or wheezing  Musculoskeletal:  [X]  Arthritis, [ ]  Low back pain,  [X]  Joint pain  Hematologic:No history of hypercoagulable state.  No history of easy bleeding.  No history of anemia  Gastrointestinal: No hematochezia or melena,  No gastroesophageal reflux, no trouble swallowing  Urinary: [ ]  chronic Kidney disease, [ ]  on HD - [ ]  MWF or [ ]  TTHS, [ ]  Burning with urination, [ ]  Frequent urination, [ ]  Difficulty urinating;   Skin: No rashes  Psychological: No history of anxiety,  No history of depression   Physical Examination  Vitals:   08/09/16 1233 08/09/16 1408 08/09/16 1728 08/09/16 1829  BP: (!) 148/72 135/66 138/79 (!) 148/77  Pulse: 93 73 96 79  Resp: 17 18 17 16   Temp:    98.7 F (37.1 C)  TempSrc:    Oral  SpO2: 98% 98% 96% 98%    There is no height or weight on file to calculate BMI.  General:  Alert and oriented, no acute distress HEENT: Normal Neck: No JVD Pulmonary: good respiratory effort no distress Cardiac:  Irregularly irregular Abdomen: Soft, non-tender Skin: No rash, both feet symmetrically cool, left foot slightly more dusky than right but both have poor cap refill Extremity Pulses:  2+ radial, brachial, femoral, absent dorsalis pedis, posterior tibial pulses bilaterally,  triphasic PT doppler monophasic DP peroneal doppler right foot, left foot biphasic PT doppler monophasic peroneal absent dP Musculoskeletal: No deformity trace pretibial edema bilaterally  Neurologic: Upper and lower extremity motor 5/5 and symmetric with exception of left wrist and hands 0/5, left facial droop  DATA:  CBC    Component Value Date/Time   WBC 6.7 08/09/2016 0648   RBC 5.21 08/09/2016 0648   HGB 15.0 08/09/2016 0707   HCT 44.0 08/09/2016 0707  PLT 121 (L) 08/09/2016 0648   MCV 84.1 08/09/2016 0648   MCH 26.7 08/09/2016 0648   MCHC 31.7 08/09/2016 0648   RDW 15.5 08/09/2016 0648   LYMPHSABS 1.9 08/09/2016 0648   MONOABS 0.6 08/09/2016 0648   EOSABS 0.2 08/09/2016 0648   BASOSABS 0.0 08/09/2016 0648    BMET    Component Value Date/Time   NA 141 08/09/2016 0707   K 4.4 08/09/2016 0707   CL 103 08/09/2016 0707   CO2 27 08/09/2016 0648   GLUCOSE 104 (H) 08/09/2016 0707   BUN 37 (H) 08/09/2016 0707   CREATININE 1.30 (H) 08/09/2016 0707   CREATININE 1.47 (H) 06/25/2016 1026   CALCIUM 9.8 08/09/2016 0648   GFRNONAA 46 (L) 08/09/2016 0648   GFRNONAA 41 (L) 06/25/2016 1026   GFRAA 53 (L) 08/09/2016 0648   GFRAA 48 (L) 06/25/2016 1026     ASSESSMENT:  Viable left foot with doppler perfusion left foot slightly better perfusion but asymptomatic and suspect this is chronic disease rather than acute embolic event.  No need for emergent embolectomy as he has perfusion and this is most likely chronic disease.  He is very high risk and as long as he remains asymptomatic and has doppler flow would observe.  Pt has not had any hemoptysis for a month and this was during episode of pneumonia so would revisit anticoagulation long term now that he has had an embolic event he will be VERY high risk to have another one.  Would carefully weigh this risk versus theoretical bleeding risk.  I will discuss with neurology but will start heparin now unless contraindicated risk of bleeding  into his infarct.   PLAN:  See above.  All above findings discussed with pt and his family   Ruta Hinds, MD [facility] Office: [phone] Pager: [phone]  Addendum: 723 pm discussed with Neuro on call low risk for bleed into infarct will start heparin with no bolus.  Pharmacy notified.  Ruta Hinds, MD [facility] Office: [phone] Pager: [phone]

## 2016-08-09 NOTE — Progress Notes (Signed)
VASCULAR LAB PRELIMINARY  PRELIMINARY  PRELIMINARY  PRELIMINARY  Left lower extremity venous duplex completed.    Preliminary report:  Left:  No evidence of DVT, superficial thrombosis, or [name]'s cyst.  Cherylin Waguespack, RVS 08/09/2016, 4:16 PM

## 2016-08-09 NOTE — Evaluation (Signed)
Occupational Therapy Evaluation Patient Details Name: [name] MRN: [id number] DOB: 08-23-1923 Today's Date: 08/09/2016    History of Present Illness Pt is  83 y/o male who presents with L facial droop, L-side weakness and numbness. Imaging revealed 2 small areas of cortical infarct at the R frontal lobe.    Clinical Impression   PT admitted with R frontal infarct. Pt currently with functional limitiations due to the deficits listed below (see OT problem list). PTA was independent with all adls. Pt reports change in vision with blurred vision at the conclusion of evaluation. RN in room to address. Recommend order for ; BIOTECH [phone] L resting hand splint for night time use. OT to set wear schedule once delivered.  Pt will benefit from skilled OT to increase their independence and safety with adls and balance to allow discharge [name]. Pt with incr risk to L hand injury due to absent sensation.   Pt will have family (A) upon dc but remains high fall risk. Educated on Water quality scientist and benefit from pull up briefs for bladder control at this time.      Follow Up Recommendations  Home health OT    Equipment Recommendations  Other (comment) (RW)    Recommendations for Other Services       Precautions / Restrictions Precautions Precautions: Fall Restrictions Weight Bearing Restrictions: No      Mobility Bed Mobility Overal bed mobility: Needs Assistance Bed Mobility: Supine to Sit     Supine to sit: Min assist     General bed mobility comments: in chair on arrival  Transfers Overall transfer level: Needs assistance Equipment used: Rolling walker (2 wheeled) Transfers: Sit to/from Stand Sit to Stand: Min assist         General transfer comment: requires (A) to power up and needs (A) to place L hand in splint on walker    Balance Overall balance assessment: Needs assistance Sitting-balance support: Feet supported;No upper extremity supported Sitting  balance-Leahy Scale: Good     Standing balance support: No upper extremity supported;During functional activity Standing balance-Leahy Scale: Poor Standing balance comment: Reliant on RW for support                            ADL Overall ADL's : Needs assistance/impaired Eating/Feeding: Minimal assistance;Sitting Eating/Feeding Details (indicate cue type and reason): daughter wiping L side of mouth. pt asked tongue sweep L side of mouth. Pt clearing R side instead. pt with decr sensation. Pt reports I keep biting myself         Lower Body Bathing: Maximal assistance Lower Body Bathing Details (indicate cue type and reason): pt attempting to void in urinal sitting with incontinence. Pt with wet boxer brief on arrival . Pt provided LB bath for hygiene and underwear provided to daughter Upper Body Dressing : Minimal assistance Upper Body Dressing Details (indicate cue type and reason): don new gown     Toilet Transfer: Minimal assistance             General ADL Comments: Pt needed (A) with meal from daughter. pt with decr movement of L hand. Pt could benefit from resting hand splint for L UE      Vision Vision Assessment?: Yes Eye Alignment: Within Functional Limits Ocular Range of Motion: Within Functional Limits Tracking/Visual Pursuits: Able to track stimulus in all quads without difficulty Additional Comments: Pt reports times of decr peripheral vision. pt reports blurred vision  after session had occluded. RN in room completing an assessment   Perception Perception Perception Tested?: Yes Perception Deficits: Inattention/neglect Inattention/Neglect: Does not attend to left visual field;Does not attend to left side of body   Praxis      Pertinent Vitals/Pain Pain Assessment: No/denies pain     Hand Dominance Right   Extremity/Trunk Assessment Upper Extremity Assessment Upper Extremity Assessment: LUE deficits/detail LUE Deficits / Details: no digit  activation, no wrist extension or flexion, ELBOW WFL, shoulder WFL. Pt reports this morning having digit movement unable to preform supination /pronation. BRUNSTROM I ( flaccid hand) LUE Sensation: decreased light touch (absent sensation at the hand) LUE Coordination: decreased fine motor;decreased gross motor   Lower Extremity Assessment Lower Extremity Assessment: Defer to PT evaluation LLE Deficits / Details: Overall good strength for age (5/5 in RLE), and noted gross 4+/5 strength in LLE. Strength deficits noted mainly in hip flexor, hamstrings, and ankle   Cervical / Trunk Assessment Cervical / Trunk Assessment: Kyphotic   Communication Communication Communication: HOH;Other (comment) (slurred speech)   Cognition Arousal/Alertness: Awake/alert Behavior During Therapy: WFL for tasks assessed/performed Overall Cognitive Status: Within Functional Limits for tasks assessed                     General Comments       Exercises       Shoulder Instructions      Home Living Family/patient expects to be discharged to:: Private residence Living Arrangements: Alone Available Help at Discharge: Family;Available 24 hours/day Type of Home: House Home Access: Ramped entrance     Home Layout: One level     Bathroom Shower/Tub: Occupational psychologist: Standard     Home Equipment: Clinical cytogeneticist - 2 wheels;Cane - single point;Bedside commode;Grab bars - toilet;Grab bars - tub/shower;Hand held shower head;Toilet riser   Additional Comments: daugher present and states several times "remember daddy we did it for momma" pt with personal experience caring for his wife      Prior Functioning/Environment Level of Independence: Independent with assistive device(s)        Comments: Uses the cane in the community but not in the house. Uses the riding lawnmower to get around some but drives a car occasionally        OT Problem List: Decreased strength;Decreased  range of motion;Decreased activity tolerance;Decreased safety awareness;Decreased knowledge of use of DME or AE;Decreased knowledge of precautions;Impaired UE functional use   OT Treatment/Interventions: Self-care/ADL training;Therapeutic exercise;Neuromuscular education;DME and/or AE instruction;Therapeutic activities;Visual/perceptual remediation/compensation;Patient/family education;Balance training    OT Goals(Current goals can be found in the care plan section) Acute Rehab OT Goals Patient Stated Goal: get my left hand back OT Goal Formulation: With patient/family Time For Goal Achievement: 08/23/16 Potential to Achieve Goals: Good  OT Frequency: Min 3X/week   Barriers to D/C:            Co-evaluation              End of Session Equipment Utilized During Treatment: Gait belt;Rolling walker Nurse Communication: Precautions;Mobility status  Activity Tolerance: Patient tolerated treatment well Patient left: in chair;with call bell/phone within reach;with chair alarm set;with family/visitor present   Time: 1333-1405 OT Time Calculation (min): 32 min Charges:  OT General Charges $OT Visit: 1 Procedure OT Evaluation $OT Eval Moderate Complexity: 1 Procedure OT Treatments $Self Care/Home Management : 8-22 mins G-Codes: OT G-codes **NOT FOR INPATIENT CLASS** Functional Assessment Tool Used: clincial judgemetn Functional Limitation: Self care Self Care  Current Status [phone]): At least 20 percent but less than 40 percent impaired, limited or restricted Self Care Goal Status (V4008): At least 1 percent but less than 20 percent impaired, limited or restricted  Parke Poisson B 08/09/2016, 2:19 PM  Jeri Modena   OTR/L Pager: [phone] Office: [phone] .

## 2016-08-09 NOTE — H&P (Signed)
[facility] Admission History and Physical Service Pager: [phone]  Patient name: [name] Medical record number: [id number] Date of birth: Apr 30, 1924 Age: 83 y.o. Gender: male  Primary Care Provider: Lupita Dawn, MD Consultants: Neurology, PT, OT, SLP  Code Status: Full per discussion on admission  Chief Complaint:  Chief Complaint  Patient presents with  . Numbness  . Stroke Symptoms   Assessment and Plan: [name] is a 83 y.o. male presenting with left facial droop, left-sided weakness and numbness . PMH is significant for an N STEMI, CAD s/p PCI/BMS 2009, HLD, atrial fibrillation, right leg DVT, hemoptysis worsen by Xarelto, chronic ITP, CHF, CKD stage III.   Right frontal CVA: Patient afebrile. Vital signs stable although is hypertensive in the 027O systolically and 536U diastolically. Physical exam showing left facial droop, left sided weakness and decreased sensation. CMP within normal limits with the exception of elevated creatinine to 1.3. CBC within normal limits with exception of slight thrombocytopenia to 121. Troponin negative 1. UA within normal limits with only trace leukocytes. EKG showing A. fib without RVR. Chest x-ray read as COPD and pulmonary fibrotic changes but no pneumonia. CT head showing two small areas of cortical infarct in the right frontal lobe,likely acute, no acute hemorrhage.  -Admit to inpatient telemetry medicine teaching service, attending Dr. Ardelia Mems -Vital signs per floor protocol -Permissive hypertension for the next 24-48 hours -Continuous cardiac monitoring -Repeat echocardiogram -Carotid ultrasound -Brain MRI, MRA -Aspirin daily 81 mg -Neuro checks every 2 hours -Hemoglobin A1c, lipid panel -PT, OT, SLP consulted -Neurology consulted, appreciate their assistance  Atrial fibrillation: Rate controlled. Home medications include metoprolol 12.5 mg twice a day and aspirin 81 mg daily. Not  on any anticoagulation as patient had substantial hemoptysis when on Xarelto in the past. Xarelto was stopped at last hospitalization for CAP and afib w/ RVR in November 2017 and was not resumed per patient after discussion with his cardiologist. CHADSvasc score 5.  -We will continue metoprolol 12.5 mg twice a day -Continue aspirin 81 mg daily -Continuous cardiac monitoring -Echo as above  Hypertension: Currently hypertensive to 160s over 100s. Home medications include lisinopril. -Hold lisinopril for permissive hypertension   HFrEF: Combined systolic and diastolic (grade 2) heart failure.. Most recent echo in November 2017 showing EF of 45-50% with diffuse hypokinesis. Home medications include Lasix 40 mg daily -Hold Lasix  -Hold lisinopril as above -Continue metoprolol as above  CKD stage 3: Baseline creatinine around 1.2-1.3. Creatinine 1.3 today at baseline.  - Daily BMP  Left leg swelling: Per patient report and chart review seems to be chronic due to CHF. Patient has had right leg DVT in the past. Patient not having any pain in his leg. -We'll order Doppler ultrasound of left leg  Chronic ITP: Platelets 121 -Daily CBC  Chronic Back Pain: Has DDD. Takes Lyrica - Continue home Lyrica  FEN/GI: nothing by mouth until after swallow screen, saline lock IV Prophylaxis:  SCDs (place only on R leg for now until DVT is ruled out on left)  Disposition:  Admit to inpatient telemetry, attending Dr. Ardelia Mems  History of Present Illness:  [name] is a 83 y.o. male presenting with left facial droop, left-sided weakness and numbness.   Patient notes that he woke up this morning around 0430 a.m. When he was in bed he realized that it was hard to sit up. After that he realized that his left arm was numb and weak and he  could not grip the bed. At that time he had walked to the bathroom and he noticed that his gait was a little bit abnormal and he felt a little bit "off balance ". He  also noticed that the left side of his face was a little bit droopy. Once he realized that this was happening he called his daughter. His daughter then called the ambulance and patient was transported to [facility] ED. He denies any chest pain, shortness of breath, palpitations, nausea, vomiting, vertigo, diarrhea, constipation, urinary retention, dysuria, fever.  ED course: Code stroke was called. He was noted to have left-sided deficits on physical exam. He was given chewable aspirin 325. Neurology was called.  Review Of Systems: Per HPI with the following additions:  See history of present illness  ROS  Patient Active Problem List   Diagnosis Date Noted  . Abnormal CT of the chest 07/21/2016  . Acute on chronic combined systolic and diastolic congestive heart failure ([place]) 06/17/2016  . Tinea pedis 05/27/2016  . NSTEMI (non-ST elevated myocardial infarction) ([place]) 05/16/2016  . Atrial fibrillation ([place]) 05/16/2016  . Hemoptysis 05/16/2016  . Elevated troponin   . Chronic ITP (idiopathic thrombocytopenia) (HCC)   . CHF (congestive heart failure), NYHA class II, chronic, diastolic ([place])   . CAP (community acquired pneumonia) 05/15/2016  . Microscopic hematuria 09/28/2014  . Gait instability 09/27/2014  . Coronary atherosclerosis of native coronary artery   . Sinus bradycardia   . Paroxysmal atrial flutter ([place])   . Pure hypercholesterolemia   . HTN (hypertension)   . Right leg DVT ([place])   . Degenerative disk disease 12/20/2010    Past Medical History: Past Medical History:  Diagnosis Date  . Coronary atherosclerosis of native coronary artery    a. Anterior STEMI 2009 s/p BMSx2 to mid & prox LAD and angioplasty to distal LAD. total RCA with collaterals, moderate Cx plaquing. a. EF 55-60% in 2013.  [name] hematuria   . Gross hematuria 11/18/2008   Qualifier: Diagnosis of  By: Alan Shark, RN, BSN, [name]    . HTN (hypertension)   . Myocardial infarct 03/21/2008  . NSVT (nonsustained  ventricular tachycardia) ([place])    a. Per DC summary from time of STEMI 2009.  [name] Paroxysmal atrial flutter ([place])    a. Abnl EKG 01/2012 concerning for atrial flutter, event monitor showed sinus bradycardia only and no pauses. Not felt to be a candidate for longterm anticoag due to hematuria.  . Phlebitis and thrombophlebitis of superficial vessels of lower extremities   . Pure hypercholesterolemia    a. Has not tolerated statins in the past.  . Right leg DVT ([place])    a. Dx 01/2014.  [name] Sinus bradycardia    a. By prior event monitor.  . Thrombocytopenia ([place])   . Urinary retention 06/20/2011    Past Surgical History: Past Surgical History:  Procedure Laterality Date  . CARPAL TUNNEL RELEASE    . CORONARY STENT PLACEMENT  2009  . REPLACEMENT TOTAL KNEE BILATERAL     Left 2014 (Dr. Eddie Dibbles); right 2012 (Dr. Redmond Pulling)  . trigger finger surgery      Social History: Social History  Substance Use Topics  . Smoking status: Never Smoker  . Smokeless tobacco: Never Used  . Alcohol use No   Additional social history: none Please also refer to relevant sections of EMR.  Family History: Family History  Problem Relation Age of Onset  . Cancer Sister   . Cancer Sister  Allergies and Medications: Allergies  Allergen Reactions  . Prednisone Other (See Comments)    Does not feel right  . Rosuvastatin Other (See Comments)    Aches   No current facility-administered medications on file prior to encounter.    Current Outpatient Prescriptions on File Prior to Encounter  Medication Sig Dispense Refill  . aspirin 81 MG tablet Take 1 tablet (81 mg total) by mouth daily. 30 tablet 0  . clotrimazole (LOTRIMIN) 1 % cream Apply 1 application topically 2 (two) times daily as needed (to site). Apply to affected area bid for 7 days. 40 g 2  . furosemide (LASIX) 20 MG tablet Take 2 tablets (40 mg total) by mouth daily. (Patient taking differently: Take 20 mg by mouth daily as needed for fluid (may  repeat one dose if needed). ) 120 tablet 2  . lisinopril (PRINIVIL,ZESTRIL) 2.5 MG tablet TAKE 1 TABLET (2.5 MG TOTAL) BY MOUTH DAILY. 90 tablet 1  . metoprolol tartrate (LOPRESSOR) 25 MG tablet TAKE 1/2 TABLET BY MOUTH 2 TIMES DAILY 30 tablet 10  . pregabalin (LYRICA) 75 MG capsule Take 1 capsule (75 mg total) by mouth 2 (two) times daily. 60 capsule 0  . Red Yeast Rice Extract (RED YEAST RICE PO) Take 1,200 mg by mouth daily.    . nitroGLYCERIN (NITROSTAT) 0.4 MG SL tablet Place 0.4 mg under the tongue every 5 (five) minutes as needed for chest pain.      Objective: BP 169/92   Pulse 88   Temp 98.2 F (36.8 C)   Resp 18   SpO2 96%  Exam: General: Pleasant elderly gentleman sitting up in bed in no acute distress Eyes: Pupils equal and reactive to light, nonicteric sclera, no conjunctival injection ENTM:  moist mucous membranes, tympanic membranes normal bilaterally, no nasal discharge Neck: supple, no lymphadenopathy Cardiovascular:  irregular rhythm, normal rate, 1+ pitting edema in right ankle, 3+ pitting edema left ankle extending to left knee, capillary Refill less than 3 seconds, 2+ DP pulses bilaterally Respiratory: normal work of breathing, clear to auscultation bilaterally Gastrointestinal: soft, nondistended, nontender, normal bowel sounds MSK: full range of motion of all joints except unable to appreciate active range of motion of left upper and lower extremities due to weakness. Knee replacements noted in bilateral knees. Derm:  hyperpigmentation noted on right lower extremity and left lower extremity. Well healed incisional Scars noted over bilateral knees Psych: normal mood and affect Neurologic Exam  Mental Status: alert; oriented to person, place and year; knowledge is normal for age; language is normal Cranial Nerves: extraocular movements are full and conjugate; pupils are round reactive to light; no slurred speech,  asymmetric smile with the left side appearing droopy;  midline tongue and uvula; normal sternocleidomastoid strength bilaterally Motor: 5 out of 5 strength in right upper and lower extremity, 4 out of 5 strength in left upper and lower extremity; no pronator drift Sensory: Grossly intact on right side, decreased on left side  Coordination: good finger-to-nose with right finger, unable to perform with left finger Gait and Station: Did not observe Reflexes:  bilateral flexor plantar responses  Labs and Imaging: CBC BMET   Recent Labs Lab 08/09/16 0648 08/09/16 0707  WBC 6.7  --   HGB 13.9 15.0  HCT 43.8 44.0  PLT 121*  --     Recent Labs Lab 08/09/16 0648 08/09/16 0707  NA 140 141  K 4.2 4.4  CL 106 103  CO2 27  --   BUN 27*  37*  CREATININE 1.32* 1.30*  GLUCOSE 99 104*  CALCIUM 9.8  --      Dg Chest 2 View  Result Date: 08/09/2016 CLINICAL DATA:  Possible CVA resulting in left hand weakness this morning. History of hypertension, hyperlipidemia, coronary artery disease with stent placement, atrial fibrillation-flutter, previous MI. EXAM: CHEST  2 VIEW COMPARISON:  Portable chest x-ray of July 19, 2016 and PA and lateral chest x-ray of June 17, 2016. FINDINGS: The lungs are well-expanded. The interstitial markings remain mildly increased especially at the lung bases. The alveolar opacities observed previously and resolved. There is subtle nodular density in the left mid lung which is stable. There is old deformity of the anterior aspect of the right first rib. The heart is top-normal in size. The pulmonary vascularity is mildly prominent. IMPRESSION: Interval resolution of alveolar infiltrates bilaterally. COPD. Pulmonary fibrotic changes. No acute pneumonia. Probable mild CHF. Electronically Signed   By: David  Martinique M.D.   On: 08/09/2016 07:46   Ct Head Code Stroke W/o Cm  Result Date: 08/09/2016 CLINICAL DATA:  Code stroke. Last seen normal 2 a.m. left-sided weakness. EXAM: CT HEAD WITHOUT CONTRAST TECHNIQUE: Contiguous axial  images were obtained from the base of the skull through the vertex without intravenous contrast. COMPARISON:  03/09/2015 FINDINGS: Brain: 2 areas of cortical blurring in the posterior right frontal region that are new from prior and compatible with acute infarct in this clinical setting. Moderate volume remote infarcts seen in the left occipital pole and inferior left temporal lobe. Remote infarct or contusion in the inferior right frontal lobe. No hemorrhage, hydrocephalus, or mass. Vascular: Atherosclerotic calcification.  No hyperdense vessel. Skull: No acute finding Sinuses/Orbits: Bile cataract resection.  No acute finding. Other: These results were called by telephone at the time of interpretation on 08/09/2016 at 7:29 am to Dr. Leonel Ramsay, who verbally acknowledged these results. ASPECTS [facility] Stroke Program Early CT Score) - Ganglionic level infarction (caudate, lentiform nuclei, internal capsule, insula, M1-M3 cortex): 6-7 - Supraganglionic infarction (M4-M6 cortex): 2 Total score (0-10 with 10 being normal): 8-9 IMPRESSION: 1. Two small areas of cortical infarct in the right frontal lobe, likely acute. ASPECTS is 8 to 9. 2. Remote ischemic injury described above. 3. No acute hemorrhage. Electronically Signed   By: Monte Fantasia M.D.   On: 08/09/2016 07:33    Carlyle Dolly, MD 08/09/2016, 8:50 AM PGY-2, [place] Intern pager: [phone], text pages welcome

## 2016-08-09 NOTE — Progress Notes (Signed)
SLP Cancellation Note  Patient Details Name: [name] MRN: [id number] DOB: 06-16-1924   Cancelled treatment:       Reason Eval/Treat Not Completed: Patient at procedure or test/unavailable. Will continue efforts.  Celia B. [place], [facility], [place]  Shonna Chock 08/09/2016, 12:14 PM

## 2016-08-09 NOTE — Consult Note (Signed)
Neurology Consultation Reason for Consult: left sided weakness Referring Physician: Mesner, J  CC: Left-sided weakness  History is obtained from: Patient  HPI: [name] is a 83 y.o. male he was noted that he was normal around 2 AM when he walked to the bathroom. Given the fact that he has relatively good strength and has some neglect, however I'm not certain if this is a definite last seen well. He was brought to the emergency department where a code stroke was called. Given his relatively low NIH and unclear time of onset, no further intervention was considered.  Of note, he has a history of atrial fibrillation and was on Xarelto, but this was stopped due to hemoptysis   LKW: ? 2 AM tpa given?: no, outside of window    ROS: A 14 point ROS was performed and is negative except as noted in the HPI.   Past Medical History:  Diagnosis Date  . Coronary atherosclerosis of native coronary artery    a. Anterior STEMI 2009 s/p BMSx2 to mid & prox LAD and angioplasty to distal LAD. total RCA with collaterals, moderate Cx plaquing. a. EF 55-60% in 2013.  [name] hematuria   . Gross hematuria 11/18/2008   Qualifier: Diagnosis of  By: Owens Shark, RN, BSN, [name]    . HTN (hypertension)   . Myocardial infarct 03/21/2008  . NSVT (nonsustained ventricular tachycardia) ([place])    a. Per DC summary from time of STEMI 2009.  [name] Paroxysmal atrial flutter ([place])    a. Abnl EKG 01/2012 concerning for atrial flutter, event monitor showed sinus bradycardia only and no pauses. Not felt to be a candidate for longterm anticoag due to hematuria.  . Phlebitis and thrombophlebitis of superficial vessels of lower extremities   . Pure hypercholesterolemia    a. Has not tolerated statins in the past.  . Right leg DVT ([place])    a. Dx 01/2014.  [name] Sinus bradycardia    a. By prior event monitor.  . Thrombocytopenia ([place])   . Urinary retention 06/20/2011     Family History  Problem Relation Age of Onset  .  Cancer Sister   . Cancer Sister      Social History:  reports that he has never smoked. He has never used smokeless tobacco. He reports that he does not drink alcohol or use drugs.   Exam: Current vital signs: BP 167/91   Pulse 93   Temp 97.2 F (36.2 C) (Oral)   Resp 13   SpO2 93%  Vital signs in last 24 hours: Temp:  [97.2 F (36.2 C)] 97.2 F (36.2 C) (01/29 0650) Pulse Rate:  [93-94] 93 (01/29 0715) Resp:  [13-23] 13 (01/29 0715) BP: (161-167)/(91-94) 167/91 (01/29 0715) SpO2:  [93 %] 93 % (01/29 0715)   Physical Exam  Constitutional: Appears well-developed and well-nourished.  Psych: Affect appropriate to situation Eyes: No scleral injection HENT: No OP obstrucion Head: Normocephalic.  Cardiovascular: Normal rate and regular rhythm.  Respiratory: Effort normal and breath sounds normal to anterior ascultation GI: Soft.  No distension. There is no tenderness.  Skin: WDI  Neuro: Mental Status: Patient is awake, alert, oriented to person, place, month, year, and situation. Patient is able to give a clear and coherent history. No signs of aphasia. He does have some mild extinction to double simultaneous stimulation Cranial Nerves: II: Visual Fields are full. Pupils are equal, round, and reactive to light.   III,IV, VI: EOMI without ptosis or diploplia.  V: Facial  sensation is symmetric to temperature VII: Facial movement is weak on the left including some left ptosis. VIII: hearing is intact to voice X: Uvula elevates symmetrically XI: Shoulder shrug is symmetric. XII: tongue is midline without atrophy or fasciculations.  Motor: Tone is normal. Bulk is normal. 5/5 strength was present on the right side, 5 minus/5 on the left with discoordination out of proportion to weakness. Sensory: Sensation is decreased throughout the left side Cerebellar: He has discrimination in his left arm out of proportion to his weakness, no clear ataxia in the left leg   I have  reviewed labs in epic and the results pertinent to this consultation are: Mildly elevated creatinine  I have reviewed the images obtained: CT head- ? Early ischemic changes.  Impression: 83 year old male with a history of atrial fibrillation not on anticoagulation due to hemoptysis who presents with mild left-sided weakness and neglect.  Recommendations: 1. HgbA1c, fasting lipid panel 2. MRI, MRA  of the brain without contrast 3. Frequent neuro checks 4. Echocardiogram 5. Carotid dopplers 6. Prophylactic therapy-Antiplatelet med: Aspirin - dose 325mg  PO or 300mg  PR 7. Risk factor modification 8. Telemetry monitoring 9. PT consult, OT consult, Speech consult 10. please page stroke NP  Or  PA  Or MD  from 8am -4 pm starting 1/30 as this patient will be followed by the stroke team at this point.   You can look them up on [email or web address]      Roland Rack, MD [facility] [phone]  If 7pm- 7am, please page neurology on call as listed in [place].

## 2016-08-09 NOTE — Progress Notes (Signed)
Physical Therapy Evaluation  Assessment: Pt admitted with above diagnosis. Pt currently with functional limitations due to the deficits listed below (see PT Problem List). At the time of PT eval pt was able to perform transfers and ambulation with min guard to min assist. Pt reports no further blurred vision at the time of session. Pt will benefit from skilled PT to increase their independence and safety with mobility to allow discharge to the venue listed below. Discussed with pt and daughter recommendation of HHPT initially with transition to outpatient upon d/c. Will continue to follow.    08/09/16 1150  PT Visit Information  Last PT Received On 08/09/16  Assistance Needed +1  History of Present Illness Pt is  83 y/o male who presents with L facial droop, L-side weakness and numbness. Imaging revealed 2 small areas of cortical infarct at the R frontal lobe.   Precautions  Precautions Fall  Restrictions  Weight Bearing Restrictions No  Home Living  Family/patient expects to be discharged to: Private residence  Living Arrangements Alone  Available Help at Discharge Family;Available 24 hours/day  Type of [place] One level  Bathroom Shower/Tub Walk-in Landscape architect - 2 wheels;Cane - single point;BSC;Grab bars - toilet;Grab bars - tub/shower;Hand held shower head;Toilet riser  Prior Function  Level of Independence Independent with assistive device(s)  Comments Uses the cane in the community but not in the house. Uses the riding lawnmower to get around some but drives a car occasionally  Communication  Communication HOH  Pain Assessment  Pain Assessment No/denies pain  Cognition  Arousal/Alertness Awake/alert  Behavior During Therapy [facility] for tasks assessed/performed  Overall Cognitive Status Within Functional Limits for tasks assessed  Upper Extremity Assessment  Upper Extremity  Assessment LUE deficits/detail;Defer to OT evaluation  Lower Extremity Assessment  Lower Extremity Assessment LLE deficits/detail  LLE Deficits / Details Overall good strength for age (5/5 in RLE), and noted gross 4+/5 strength in LLE. Strength deficits noted mainly in hip flexor, hamstrings, and ankle  Cervical / Trunk Assessment  Cervical / Trunk Assessment Kyphotic  Bed Mobility  Overal bed mobility Needs Assistance  Bed Mobility Supine to Sit  Supine to sit Min assist  General bed mobility comments Pt using momentum to attempt to transition to EOB. Was able to elevate trunk to full sitting position with min assist at shoulders. HOB flat and no rails for support.   Transfers  Overall transfer level Needs assistance  Equipment used Rolling walker (2 wheeled)  Transfers Sit to/from Stand  Sit to Stand Min guard  General transfer comment Close guard for safety as pt powered-up to full standing position.   Ambulation/Gait  Ambulation/Gait assistance Min assist  Ambulation Distance (Feet) 20 Feet  Assistive device Rolling walker (2 wheeled)  Gait Pattern/deviations Step-through pattern;Decreased stride length;Staggering left;Trunk flexed  General Gait Details Assist for balance support, occasional walker management, and to hold L hand onto walker.   Gait velocity Decreased  Gait velocity interpretation Below normal speed for age/gender  Modified Rankin (Stroke Patients Only)  Pre-Morbid Rankin Score 0  Modified Rankin 4  Balance  Overall balance assessment Needs assistance  Sitting-balance support Feet supported;No upper extremity supported  Sitting balance-Leahy Scale Good  Standing balance support No upper extremity supported;During functional activity  Standing balance-Leahy Scale Poor  Standing balance comment Reliant on RW for support  PT - End of Session  Equipment Utilized During  Treatment Gait belt  Activity Tolerance Patient tolerated treatment well  Patient left in  chair;with call bell/phone within reach;with chair alarm set;with family/visitor present  Nurse Communication Mobility status  PT Assessment  PT Recommendation/Assessment Patient needs continued PT services  PT Problem List Decreased strength;Decreased range of motion;Decreased balance;Decreased activity tolerance;Decreased mobility;Decreased knowledge of use of DME;Decreased knowledge of precautions;Decreased safety awareness;Decreased coordination  PT Plan  PT Frequency (ACUTE ONLY) Min 4X/week  PT Treatment/Interventions (ACUTE ONLY) DME instruction;Gait training;Stair training;Functional mobility training;Therapeutic activities;Therapeutic exercise;Neuromuscular re-education;Patient/family education  PT Recommendation  Follow Up Recommendations Home health PT;Supervision/Assistance - 24 hour  Individuals Consulted  Consulted and Agree with Results and Recommendations Patient;Family member/caregiver  Family Member Consulted Daughter  Acute Rehab PT Goals  Patient Stated Goal Return to being at a modified independent level (independent with cane)  PT Goal Formulation With patient/family  Time For Goal Achievement 08/16/16  Potential to Achieve Goals Good  PT Time Calculation  PT Start Time (ACUTE ONLY) 1150  PT Stop Time (ACUTE ONLY) 1219  PT Time Calculation (min) (ACUTE ONLY) 29 min  PT G-Codes **NOT FOR INPATIENT CLASS**  Functional Assessment Tool Used Clinical judgement  Functional Limitation Mobility: Walking and moving around  Mobility: Walking and Moving Around Current Status (M3536) CI  Mobility: Walking and Moving Around Goal Status (R4431) CI  PT General Charges  $$ ACUTE PT VISIT 1 Procedure  PT Evaluation  $PT Eval Moderate Complexity 1 Procedure  PT Treatments  $Gait Training 8-22 mins  Written Expression  Dominant Hand Right   Rolinda Roan, PT, DPT Acute Rehabilitation Services Pager: [phone]

## 2016-08-09 NOTE — Progress Notes (Signed)
ANTICOAGULATION CONSULT NOTE - Initial Consult  Pharmacy Consult for heparin Indication: atrial fibrillation, L foot ischemia  Allergies  Allergen Reactions  . Prednisone Other (See Comments)    Does not feel right  . Rosuvastatin Other (See Comments)    Aches    Patient Measurements: Pt wt 77 kg  Vital Signs: Temp: 98.7 F (37.1 C) (01/29 1829) Temp Source: Oral (01/29 1829) BP: 148/77 (01/29 1829) Pulse Rate: 79 (01/29 1829)  Labs:  Recent Labs  08/09/16 0648 08/09/16 0707  HGB 13.9 15.0  HCT 43.8 44.0  PLT 121*  --   APTT 34  --   LABPROT 14.2  --   INR 1.10  --   CREATININE 1.32* 1.30*    Estimated Creatinine Clearance: 39 mL/min (by C-G formula based on SCr of 1.3 mg/dL (H)).   Medical History: Past Medical History:  Diagnosis Date  . Coronary atherosclerosis of native coronary artery    a. Anterior STEMI 2009 s/p BMSx2 to mid & prox LAD and angioplasty to distal LAD. total RCA with collaterals, moderate Cx plaquing. a. EF 55-60% in 2013.  [name] hematuria   . Gross hematuria 11/18/2008   Qualifier: Diagnosis of  By: Owens Shark, RN, BSN, [name]    . HTN (hypertension)   . Myocardial infarct 03/21/2008  . NSVT (nonsustained ventricular tachycardia) ([place])    a. Per DC summary from time of STEMI 2009.  [name] Paroxysmal atrial flutter ([place])    a. Abnl EKG 01/2012 concerning for atrial flutter, event monitor showed sinus bradycardia only and no pauses. Not felt to be a candidate for longterm anticoag due to hematuria.  . Phlebitis and thrombophlebitis of superficial vessels of lower extremities   . Pure hypercholesterolemia    a. Has not tolerated statins in the past.  . Right leg DVT ([place])    a. Dx 01/2014.  [name] Sinus bradycardia    a. By prior event monitor.  . Thrombocytopenia ([place])   . Urinary retention 06/20/2011    Assessment: 83 yo male admitted earlier today with acute CVA (did not receive TPA) and left foot ischemia. Pt has hx of chronic atrial  fibrillation but was not on a/c prior to arrival as xarelto was stopped over a month ago 2/2 hemoptysis that has since resolved. Pharmacy asked to start heparin for atrial fibrillation/ left foot ischemia. CBC stable.    Goal of Therapy:  Heparin level 0.3 -0.5 units/l Monitor platelets by anticoagulation protocol: Yes   Plan:  1. Begin heparin at 900 units/hr; no bolus 2/2 to acute CVA 2. Heparin level 8 hours after starting infusion  3. Daily heparin level and CBC 4. Follow up on long term a/c plans   Vincenza Hews, PharmD, BCPS 08/09/2016, 7:29 PM

## 2016-08-09 NOTE — ED Triage Notes (Addendum)
Pt BIB EMS from home. Reports woke up approx 0400 and had difficulty making a fist with his left hand. Also had a little difficulty keeping his balance but has hx of vertigo. EMS noted no neural deficits on scene. Upon arrival at [facility], pt presenting with L sided facial droop. Code Stroke called by Dr. Dolly Rias.

## 2016-08-09 NOTE — Progress Notes (Signed)
Pt daughter reported to this nurse that immediately after pt ambulated with therapy she noticed that he had blurred vision, left facial droop, and slurred speech that increased. Neuro assessment done NIH-5. Pt stable, no noted distress. Vital signs assessed. No pain. MD notified. Will continue to monitor.

## 2016-08-09 NOTE — Progress Notes (Signed)
Code stroke called at [phone].  Patient arrived to [facility] ED via [facility] at [phone].  LSN 0200, woke at 0400 with inability to make a fist and imbalance.  NIHSS 5.  Patient outside of window.

## 2016-08-09 NOTE — Progress Notes (Signed)
VASCULAR LAB PRELIMINARY  PRELIMINARY  PRELIMINARY  PRELIMINARY  Carotid duplex completed.    Preliminary report:  Right - 1% to 39% ICA stenosis. Vertebral artery flow is antegrade. Left - No evidence of significant ICA stenosis. Vertebral artery flow is antegrade/  [name], RVS 08/09/2016, 3:52 PM

## 2016-08-09 NOTE — ED Provider Notes (Signed)
[facility] DEPT Provider Note   CSN: [id number] Arrival date & time: 08/09/16  0636     History   Chief Complaint Chief Complaint  Patient presents with  . Numbness  . Stroke Symptoms    HPI [name] is a 83 y.o. male.   Neurologic Problem  This is a new problem. The current episode started 3 to 5 hours ago. The problem occurs constantly. The problem has not changed since onset.Pertinent negatives include no chest pain and no headaches. Nothing aggravates the symptoms. Nothing relieves the symptoms. He has tried nothing for the symptoms.    Past Medical History:  Diagnosis Date  . Coronary atherosclerosis of native coronary artery    a. Anterior STEMI 2009 s/p BMSx2 to mid & prox LAD and angioplasty to distal LAD. total RCA with collaterals, moderate Cx plaquing. a. EF 55-60% in 2013.  [name] hematuria   . Gross hematuria 11/18/2008   Qualifier: Diagnosis of  By: Owens Shark, RN, BSN, [name]    . HTN (hypertension)   . Myocardial infarct 03/21/2008  . NSVT (nonsustained ventricular tachycardia) ([place])    a. Per DC summary from time of STEMI 2009.  [name] Paroxysmal atrial flutter ([place])    a. Abnl EKG 01/2012 concerning for atrial flutter, event monitor showed sinus bradycardia only and no pauses. Not felt to be a candidate for longterm anticoag due to hematuria.  . Phlebitis and thrombophlebitis of superficial vessels of lower extremities   . Pure hypercholesterolemia    a. Has not tolerated statins in the past.  . Right leg DVT ([place])    a. Dx 01/2014.  [name] Sinus bradycardia    a. By prior event monitor.  . Thrombocytopenia ([place])   . Urinary retention 06/20/2011    Patient Active Problem List   Diagnosis Date Noted  . CVA (cerebral vascular accident) ([place]) 08/09/2016  . Abnormal CT of the chest 07/21/2016  . Acute on chronic combined systolic and diastolic congestive heart failure ([place]) 06/17/2016  . Tinea pedis 05/27/2016  . NSTEMI (non-ST elevated myocardial  infarction) ([place]) 05/16/2016  . Atrial fibrillation ([place]) 05/16/2016  . Hemoptysis 05/16/2016  . Elevated troponin   . Chronic ITP (idiopathic thrombocytopenia) ([facility])   . CHF (congestive heart failure), NYHA class II, chronic, diastolic ([place])   . CAP (community acquired pneumonia) 05/15/2016  . Microscopic hematuria 09/28/2014  . Gait instability 09/27/2014  . Coronary atherosclerosis of native coronary artery   . Sinus bradycardia   . Paroxysmal atrial flutter ([place])   . Pure hypercholesterolemia   . HTN (hypertension)   . Right leg DVT ([place])   . Degenerative disk disease 12/20/2010    Past Surgical History:  Procedure Laterality Date  . CARPAL TUNNEL RELEASE    . CORONARY STENT PLACEMENT  2009  . REPLACEMENT TOTAL KNEE BILATERAL     Left 2014 (Dr. Eddie Dibbles); right 2012 (Dr. Redmond Pulling)  . trigger finger surgery         Home Medications    Prior to Admission medications   Medication Sig Start Date End Date Taking? Authorizing Provider  aspirin 81 MG tablet Take 1 tablet (81 mg total) by mouth daily. 06/25/16  Yes Brittainy Erie Noe, PA-C  clotrimazole (LOTRIMIN) 1 % cream Apply 1 application topically 2 (two) times daily as needed (to site). Apply to affected area bid for 7 days. 05/27/16  Yes Lupita Dawn, MD  fluticasone (FLONASE) 50 MCG/ACT nasal spray Place 2 sprays into both nostrils daily. 08/02/16  Yes Historical Provider, MD  furosemide (LASIX) 20 MG tablet Take 2 tablets (40 mg total) by mouth daily. Patient taking differently: Take 20 mg by mouth daily as needed for fluid (may repeat one dose if needed).  06/25/16  Yes Lupita Dawn, MD  lisinopril (PRINIVIL,ZESTRIL) 2.5 MG tablet TAKE 1 TABLET (2.5 MG TOTAL) BY MOUTH DAILY. 07/14/16  Yes Lupita Dawn, MD  metoprolol tartrate (LOPRESSOR) 25 MG tablet TAKE 1/2 TABLET BY MOUTH 2 TIMES DAILY 06/07/16  Yes Burnell Blanks, MD  pregabalin (LYRICA) 75 MG capsule Take 1 capsule (75 mg total) by mouth 2 (two) times daily.  05/18/16  Yes Asencion Partridge, MD  Red Yeast Rice Extract (RED YEAST RICE PO) Take 1,200 mg by mouth daily.   Yes Historical Provider, MD  nitroGLYCERIN (NITROSTAT) 0.4 MG SL tablet Place 0.4 mg under the tongue every 5 (five) minutes as needed for chest pain.    Historical Provider, MD    Family History Family History  Problem Relation Age of Onset  . Cancer Sister   . Cancer Sister     Social History Social History  Substance Use Topics  . Smoking status: Never Smoker  . Smokeless tobacco: Never Used  . Alcohol use No     Allergies   Prednisone and Rosuvastatin   Review of Systems Review of Systems  Cardiovascular: Negative for chest pain.  Neurological: Positive for weakness and numbness. Negative for headaches.  All other systems reviewed and are negative.    Physical Exam Updated Vital Signs BP (!) 141/103   Pulse 73   Temp 98.2 F (36.8 C)   Resp 13   SpO2 98%   Physical Exam  Constitutional: He is oriented to person, place, and time. He appears well-developed and well-nourished.  HENT:  Head: Normocephalic and atraumatic.  Eyes: Conjunctivae and EOM are normal.  Neck: Normal range of motion.  Cardiovascular: Normal rate.  An irregularly irregular rhythm present.  Pulmonary/Chest: Effort normal. No respiratory distress.  Abdominal: Soft. He exhibits no distension.  Musculoskeletal: Normal range of motion. He exhibits no deformity.  Neurological: He is alert and oriented to person, place, and time. No cranial nerve deficit. Coordination normal.  Mild left facial droop.  Markedly decreased left grip strength but able to hold arm above gravity.   Skin: Skin is warm.  Nursing note and vitals reviewed.    ED Treatments / Results  Labs (all labs ordered are listed, but only abnormal results are displayed) Labs Reviewed  CBC - Abnormal; Notable for the following:       Result Value   Platelets 121 (*)    All other components within normal limits    COMPREHENSIVE METABOLIC PANEL - Abnormal; Notable for the following:    BUN 27 (*)    Creatinine, Ser 1.32 (*)    Total Bilirubin 1.7 (*)    GFR calc non Af Amer 46 (*)    GFR calc Af Amer 53 (*)    All other components within normal limits  CBG MONITORING, ED - Abnormal; Notable for the following:    Glucose-Capillary 107 (*)    All other components within normal limits  I-STAT CHEM 8, ED - Abnormal; Notable for the following:    BUN 37 (*)    Creatinine, Ser 1.30 (*)    Glucose, Bld 104 (*)    All other components within normal limits  URINE CULTURE  PROTIME-INR  APTT  DIFFERENTIAL  URINALYSIS, ROUTINE W REFLEX MICROSCOPIC  Randolm Idol, ED    EKG  EKG Interpretation  Date/Time:  Monday August 09 2016 06:47:46 EST Ventricular Rate:  85 PR Interval:    QRS Duration: 105 QT Interval:  376 QTC Calculation: 448 R Axis:   -78 Text Interpretation:  Atrial fibrillation Left anterior fascicular block Anteroseptal infarct, age indeterminate No significant change since last tracing Confirmed by [facility] MD, Corene Cornea [phone]) on 08/09/2016 7:07:29 AM       Radiology Dg Chest 2 View  Result Date: 08/09/2016 CLINICAL DATA:  Possible CVA resulting in left hand weakness this morning. History of hypertension, hyperlipidemia, coronary artery disease with stent placement, atrial fibrillation-flutter, previous MI. EXAM: CHEST  2 VIEW COMPARISON:  Portable chest x-ray of July 19, 2016 and PA and lateral chest x-ray of June 17, 2016. FINDINGS: The lungs are well-expanded. The interstitial markings remain mildly increased especially at the lung bases. The alveolar opacities observed previously and resolved. There is subtle nodular density in the left mid lung which is stable. There is old deformity of the anterior aspect of the right first rib. The heart is top-normal in size. The pulmonary vascularity is mildly prominent. IMPRESSION: Interval resolution of alveolar infiltrates bilaterally.  COPD. Pulmonary fibrotic changes. No acute pneumonia. Probable mild CHF. Electronically Signed   By: David  Martinique M.D.   On: 08/09/2016 07:46   Ct Head Code Stroke W/o Cm  Result Date: 08/09/2016 CLINICAL DATA:  Code stroke. Last seen normal 2 a.m. left-sided weakness. EXAM: CT HEAD WITHOUT CONTRAST TECHNIQUE: Contiguous axial images were obtained from the base of the skull through the vertex without intravenous contrast. COMPARISON:  03/09/2015 FINDINGS: Brain: 2 areas of cortical blurring in the posterior right frontal region that are new from prior and compatible with acute infarct in this clinical setting. Moderate volume remote infarcts seen in the left occipital pole and inferior left temporal lobe. Remote infarct or contusion in the inferior right frontal lobe. No hemorrhage, hydrocephalus, or mass. Vascular: Atherosclerotic calcification.  No hyperdense vessel. Skull: No acute finding Sinuses/Orbits: Bile cataract resection.  No acute finding. Other: These results were called by telephone at the time of interpretation on 08/09/2016 at 7:29 am to Dr. Leonel Ramsay, who verbally acknowledged these results. ASPECTS [facility] Stroke Program Early CT Score) - Ganglionic level infarction (caudate, lentiform nuclei, internal capsule, insula, M1-M3 cortex): 6-7 - Supraganglionic infarction (M4-M6 cortex): 2 Total score (0-10 with 10 being normal): 8-9 IMPRESSION: 1. Two small areas of cortical infarct in the right frontal lobe, likely acute. ASPECTS is 8 to 9. 2. Remote ischemic injury described above. 3. No acute hemorrhage. Electronically Signed   By: Monte Fantasia M.D.   On: 08/09/2016 07:33    Procedures Procedures (including critical care time)  CRITICAL CARE Performed by: Merrily Pew Total critical care time: 35 minutes Critical care time was exclusive of separately billable procedures and treating other patients. Critical care was necessary to treat or prevent imminent or life-threatening  deterioration. Critical care was time spent personally by me on the following activities: development of treatment plan with patient and/or surrogate as well as nursing, discussions with consultants, evaluation of patient's response to treatment, examination of patient, obtaining history from patient or surrogate, ordering and performing treatments and interventions, ordering and review of laboratory studies, ordering and review of radiographic studies, pulse oximetry and re-evaluation of patient's condition.   Medications Ordered in ED Medications  aspirin chewable tablet 324 mg (not administered)     Initial Impression / Assessment and Plan /  ED Course  I have reviewed the triage vital signs and the nursing notes.  Pertinent labs & imaging results that were available during my care of the patient were reviewed by me and considered in my medical decision making (see chart for details).   On my exam with left sided deficits, LKN 0230. Code stroke activated. Ct with e/o CVA. Considered tPA, but outside window. Also not a big enough occlusion for intraarterial interventional therapy. Will plan for cva workup and medicine admission.    Final Clinical Impressions(s) / ED Diagnoses   Final diagnoses:  Cerebrovascular accident (CVA), unspecified mechanism ([place])     Merrily Pew, MD 08/09/16 [phone]

## 2016-08-10 ENCOUNTER — Observation Stay (HOSPITAL_COMMUNITY): Payer: Medicare Other

## 2016-08-10 ENCOUNTER — Observation Stay (HOSPITAL_BASED_OUTPATIENT_CLINIC_OR_DEPARTMENT_OTHER): Payer: Medicare Other

## 2016-08-10 DIAGNOSIS — I69392 Facial weakness following cerebral infarction: Secondary | ICD-10-CM | POA: Diagnosis not present

## 2016-08-10 DIAGNOSIS — E7800 Pure hypercholesterolemia, unspecified: Secondary | ICD-10-CM | POA: Diagnosis present

## 2016-08-10 DIAGNOSIS — I63411 Cerebral infarction due to embolism of right middle cerebral artery: Secondary | ICD-10-CM | POA: Diagnosis not present

## 2016-08-10 DIAGNOSIS — Z79899 Other long term (current) drug therapy: Secondary | ICD-10-CM | POA: Diagnosis not present

## 2016-08-10 DIAGNOSIS — Z23 Encounter for immunization: Secondary | ICD-10-CM | POA: Diagnosis present

## 2016-08-10 DIAGNOSIS — R209 Unspecified disturbances of skin sensation: Secondary | ICD-10-CM

## 2016-08-10 DIAGNOSIS — I998 Other disorder of circulatory system: Secondary | ICD-10-CM | POA: Diagnosis not present

## 2016-08-10 DIAGNOSIS — Z7982 Long term (current) use of aspirin: Secondary | ICD-10-CM | POA: Diagnosis not present

## 2016-08-10 DIAGNOSIS — Z86718 Personal history of other venous thrombosis and embolism: Secondary | ICD-10-CM | POA: Diagnosis not present

## 2016-08-10 DIAGNOSIS — I252 Old myocardial infarction: Secondary | ICD-10-CM | POA: Diagnosis not present

## 2016-08-10 DIAGNOSIS — I5042 Chronic combined systolic (congestive) and diastolic (congestive) heart failure: Secondary | ICD-10-CM | POA: Diagnosis present

## 2016-08-10 DIAGNOSIS — E785 Hyperlipidemia, unspecified: Secondary | ICD-10-CM | POA: Diagnosis present

## 2016-08-10 DIAGNOSIS — Z96653 Presence of artificial knee joint, bilateral: Secondary | ICD-10-CM | POA: Diagnosis present

## 2016-08-10 DIAGNOSIS — J449 Chronic obstructive pulmonary disease, unspecified: Secondary | ICD-10-CM | POA: Diagnosis present

## 2016-08-10 DIAGNOSIS — R200 Anesthesia of skin: Secondary | ICD-10-CM | POA: Diagnosis present

## 2016-08-10 DIAGNOSIS — Z7951 Long term (current) use of inhaled steroids: Secondary | ICD-10-CM | POA: Diagnosis not present

## 2016-08-10 DIAGNOSIS — I482 Chronic atrial fibrillation: Secondary | ICD-10-CM

## 2016-08-10 DIAGNOSIS — Z955 Presence of coronary angioplasty implant and graft: Secondary | ICD-10-CM | POA: Diagnosis not present

## 2016-08-10 DIAGNOSIS — D693 Immune thrombocytopenic purpura: Secondary | ICD-10-CM | POA: Diagnosis present

## 2016-08-10 DIAGNOSIS — I6340 Cerebral infarction due to embolism of unspecified cerebral artery: Secondary | ICD-10-CM | POA: Diagnosis present

## 2016-08-10 DIAGNOSIS — I130 Hypertensive heart and chronic kidney disease with heart failure and stage 1 through stage 4 chronic kidney disease, or unspecified chronic kidney disease: Secondary | ICD-10-CM | POA: Diagnosis present

## 2016-08-10 DIAGNOSIS — I6789 Other cerebrovascular disease: Secondary | ICD-10-CM

## 2016-08-10 DIAGNOSIS — I639 Cerebral infarction, unspecified: Secondary | ICD-10-CM | POA: Diagnosis not present

## 2016-08-10 DIAGNOSIS — G8194 Hemiplegia, unspecified affecting left nondominant side: Secondary | ICD-10-CM | POA: Diagnosis present

## 2016-08-10 DIAGNOSIS — I2510 Atherosclerotic heart disease of native coronary artery without angina pectoris: Secondary | ICD-10-CM | POA: Diagnosis present

## 2016-08-10 DIAGNOSIS — N183 Chronic kidney disease, stage 3 (moderate): Secondary | ICD-10-CM | POA: Diagnosis present

## 2016-08-10 DIAGNOSIS — M549 Dorsalgia, unspecified: Secondary | ICD-10-CM | POA: Diagnosis present

## 2016-08-10 DIAGNOSIS — G8929 Other chronic pain: Secondary | ICD-10-CM | POA: Diagnosis present

## 2016-08-10 LAB — URINE CULTURE: Culture: NO GROWTH

## 2016-08-10 LAB — CBC
HEMATOCRIT: 41.1 % (ref 39.0–52.0)
HEMOGLOBIN: 13 g/dL (ref 13.0–17.0)
MCH: 26.4 pg (ref 26.0–34.0)
MCHC: 31.6 g/dL (ref 30.0–36.0)
MCV: 83.5 fL (ref 78.0–100.0)
Platelets: 107 10*3/uL — ABNORMAL LOW (ref 150–400)
RBC: 4.92 MIL/uL (ref 4.22–5.81)
RDW: 15.6 % — ABNORMAL HIGH (ref 11.5–15.5)
WBC: 6.1 10*3/uL (ref 4.0–10.5)

## 2016-08-10 LAB — VAS US CAROTID
LCCADDIAS: -18 cm/s
LCCAPSYS: 67 cm/s
LEFT ECA DIAS: -11 cm/s
LEFT VERTEBRAL DIAS: 7 cm/s
LICADDIAS: -20 cm/s
LICADSYS: -60 cm/s
LICAPSYS: -67 cm/s
Left CCA dist sys: -77 cm/s
Left CCA prox dias: 9 cm/s
Left ICA prox dias: -15 cm/s
RIGHT ECA DIAS: -6 cm/s
Right CCA prox dias: 16 cm/s
Right CCA prox sys: 85 cm/s
Right cca dist sys: -49 cm/s

## 2016-08-10 LAB — LIPID PANEL
CHOL/HDL RATIO: 3.3 ratio
CHOLESTEROL: 139 mg/dL (ref 0–200)
HDL: 42 mg/dL (ref >40)
LDL CALC: 88 mg/dL (ref 0–99)
TRIGLYCERIDES: 46 mg/dL (ref <150)
VLDL: 9 mg/dL (ref 0–40)

## 2016-08-10 LAB — BASIC METABOLIC PANEL
ANION GAP: 10 (ref 5–15)
BUN: 22 mg/dL — ABNORMAL HIGH (ref 6–20)
CO2: 26 mmol/L (ref 22–32)
Calcium: 9.7 mg/dL (ref 8.9–10.3)
Chloride: 104 mmol/L (ref 101–111)
Creatinine, Ser: 1.06 mg/dL (ref 0.61–1.24)
GFR calc Af Amer: >60 mL/min (ref >60)
GFR calc non Af Amer: 60 mL/min — ABNORMAL LOW (ref >60)
Glucose, Bld: 104 mg/dL — ABNORMAL HIGH (ref 65–99)
POTASSIUM: 4.2 mmol/L (ref 3.5–5.1)
Sodium: 140 mmol/L (ref 135–145)

## 2016-08-10 LAB — ECHOCARDIOGRAM COMPLETE

## 2016-08-10 LAB — HEPARIN LEVEL (UNFRACTIONATED)
Heparin Unfractionated: 0.27 IU/mL — ABNORMAL LOW (ref 0.30–0.70)
Heparin Unfractionated: 0.33 IU/mL (ref 0.30–0.70)

## 2016-08-10 MED ORDER — PRAVASTATIN SODIUM 20 MG PO TABS: 20.0000 mg | ORAL_TABLET | Freq: Every day | ORAL | Status: DC

## 2016-08-10 NOTE — Progress Notes (Signed)
Orthopedic Tech Progress Note Patient Details:  [name] 09/05/1923 [id number]  Ortho Devices Type of Ortho Device: Velcro wrist forearm splint Ortho Device/Splint Location: LUE Ortho Device/Splint Interventions: Ordered, Application   Braulio Bosch 08/10/2016, 10:34 PM

## 2016-08-10 NOTE — Consult Note (Signed)
[facility]   Subjective  - left hand feels better, still no pain in feet   Objective (!) 147/84 87 97.9 F (36.6 C) (Oral) 16 94%  Intake/Output Summary (Last 24 hours) at 08/10/16 6579 Last data filed at 08/10/16 0446  Gross per 24 hour  Intake                0 ml  Output              350 ml  Net             -350 ml   Doppler flow PT biphasic to triphasic right greater than left Monophasic DP bilaterally  Assessment/Planning: Viable feet with adequate perfusion Will get ABIs today for baseline in the event he has future problems otherwise will sign off Long term oral Anticoagulation at discretion of primary team but I would favor restarting this and certainly would continue heparin while in hospital  Ruta Hinds 08/10/2016 8:08 AM --  Laboratory Lab Results:  Recent Labs  08/09/16 0648 08/09/16 0707 08/10/16 0323  WBC 6.7  --  6.1  HGB 13.9 15.0 13.0  HCT 43.8 44.0 41.1  PLT 121*  --  107*   BMET  Recent Labs  08/09/16 0648 08/09/16 0707 08/10/16 0323  NA 140 141 140  K 4.2 4.4 4.2  CL 106 103 104  CO2 27  --  26  GLUCOSE 99 104* 104*  BUN 27* 37* 22*  CREATININE 1.32* 1.30* 1.06  CALCIUM 9.8  --  9.7    COAG Lab Results  Component Value Date   INR 1.10 08/09/2016   INR 1.99 05/16/2016   INR 1.23 11/23/2009   No results found for: PTT

## 2016-08-10 NOTE — Progress Notes (Signed)
ANTICOAGULATION CONSULT NOTE - Follow-up Consult  Pharmacy Consult for heparin Indication: atrial fibrillation, L foot ischemia  Allergies  Allergen Reactions  . Prednisone Other (See Comments)    Does not feel right  . Rosuvastatin Other (See Comments)    Aches    Patient Measurements: Pt wt 77 kg  Vital Signs: Temp: 98.2 F (36.8 C) (01/30 0019) Temp Source: Oral (01/30 0019) BP: 149/82 (01/30 0019) Pulse Rate: 102 (01/30 0019)  Labs:  Recent Labs  08/09/16 0648 08/09/16 0707 08/10/16 0323  HGB 13.9 15.0  --   HCT 43.8 44.0  --   PLT 121*  --   --   APTT 34  --   --   LABPROT 14.2  --   --   INR 1.10  --   --   HEPARINUNFRC  --   --  0.27*  CREATININE 1.32* 1.30*  --     Estimated Creatinine Clearance: 39 mL/min (by C-G formula based on SCr of 1.3 mg/dL (H)).  Assessment: 83 yo male admitted earlier today with acute CVA (did not receive TPA) and left foot ischemia. Pt has hx of chronic atrial fibrillation but was not on a/c prior to arrival as xarelto was stopped over a month ago 2/2 hemoptysis that has since resolved. Pharmacy asked to start heparin for atrial fibrillation/ left foot ischemia.  Heparin level slightly subtherapeutic (0.27) on gtt at 900 units/hr. No issues with line or bleeding reported per RN.  Goal of Therapy:  Heparin level 0.3 -0.5 units/ml Monitor platelets by anticoagulation protocol: Yes   Plan:  Increase heparin to 1000 units/hr F/u 8hr heparin level  Sherlon Handing, PharmD, BCPS Clinical pharmacist, pager [phone] 08/10/2016, 4:11 AM

## 2016-08-10 NOTE — Progress Notes (Signed)
  Echocardiogram 2D Echocardiogram has been performed.  [name] 08/10/2016, 10:16 AM

## 2016-08-10 NOTE — Progress Notes (Signed)
VASCULAR LAB PRELIMINARY  ARTERIAL  ABI completed:    RIGHT    LEFT    PRESSURE WAVEFORM  PRESSURE WAVEFORM  BRACHIAL  Triphasic BRACHIAL 124 Triphasic  DP 143 Biphasic DP 108 Monophasic  AT   AT    PT 134 Biphasic PT 118 Monophasic  PER   PER    GREAT TOE  NA GREAT TOE  NA    RIGHT LEFT  ABI 1.15 0.95   The right ABI is within normal limits at rest. The left ABI is within normal limits, borderline mild arterial insufficiency, at rest.  08/10/2016 9:37 AM [name], BS, RVT, RDCS, RDMS

## 2016-08-10 NOTE — Progress Notes (Signed)
FPTS Attending Progress Note  S: doing well. Has increased strength in left hand. Lacerations on left hand have not been bleeding. Tolerating IV heparin  O: BP 127/75 (BP Location: Left Arm)   Pulse 95   Temp 97.8 F (36.6 C) (Oral)   Resp 20   SpO2 96%   Gen: NAD, pleasant, cooperative HEENT: normocephalic, atraumatic. Left facial droop remains present Heart: irreg irreg Lungs: normal work of breathing  Abdomen: soft nontender to palpation  Neuro: speech normal, alert & oriented. Grip strength improved on left from yesterday. Skin: hemostatic lacerations on left hand, no signs of infection  A/P: Stroke  - await completion of stroke workup (MRI/MRA, echo, SLP, other workup as recommended by neuro) - will need to have discussion regarding resumption of anticoagulation - patient is willing to resume if this is recommended  Cold foot - improved today, much warmer. Remains on IV heparin. ABIs per vascular surgery, appreciate recommendations   Lacerations - hemostatic, healing, no signs of infection. need to be sure Tdap UTD. Will have [place] clinic staff check [place].  Other chronic problems stable Will sign resident note when it is complete.  Chrisandra Netters, MD [place]

## 2016-08-10 NOTE — Progress Notes (Signed)
ANTICOAGULATION CONSULT NOTE - Follow-up Consult  Pharmacy Consult for heparin Indication: atrial fibrillation, L foot ischemia  Allergies  Allergen Reactions  . Prednisone Other (See Comments)    Does not feel right  . Rosuvastatin Other (See Comments)    Aches    Patient Measurements: Pt wt 77 kg  Vital Signs: Temp: 97.8 F (36.6 C) (01/30 1402) Temp Source: Oral (01/30 1402) BP: 127/75 (01/30 1402) Pulse Rate: 93 (01/30 1402)  Labs:  Recent Labs  08/09/16 0648 08/09/16 0707 08/10/16 0323 08/10/16 1330  HGB 13.9 15.0 13.0  --   HCT 43.8 44.0 41.1  --   PLT 121*  --  107*  --   APTT 34  --   --   --   LABPROT 14.2  --   --   --   INR 1.10  --   --   --   HEPARINUNFRC  --   --  0.27* 0.33  CREATININE 1.32* 1.30* 1.06  --     Estimated Creatinine Clearance: 47.8 mL/min (by C-G formula based on SCr of 1.06 mg/dL).  Assessment: 83 yo male admitted with acute CVA (did not receive TPA) and left foot ischemia. Pt has hx of chronic atrial fibrillation but was not on a/c prior to arrival as xarelto was stopped over a month ago 2/2 hemoptysis that has since resolved. Pharmacy asked to start heparin for atrial fibrillation/ left foot ischemia. Heparin level is now at goal on 1000 units/hr. No bleeding noted.   Goal of Therapy:  Heparin level 0.3 -0.5 units/ml Monitor platelets by anticoagulation protocol: Yes   Plan:  Continue heparin gtt at 1000 units/hr Daily heparin level and CBC  Salome Arnt, PharmD, BCPS Pager # [phone] 08/10/2016 2:13 PM

## 2016-08-10 NOTE — Progress Notes (Signed)
Physical Therapy Treatment Patient Details Name: [name] MRN: [id number] DOB: 1923/10/06 Today's Date: 08/10/2016    History of Present Illness Pt is  83 y/o male who presents with L facial droop, L-side weakness and numbness. Imaging revealed 2 small areas of cortical infarct at the R frontal lobe.     PT Comments    Pt is making steady progress with mobility. Acute PT to continue during pt's hospital stay.  Follow Up Recommendations  Home health PT;Supervision/Assistance - 24 hour     Precautions / Restrictions Precautions Precautions: Fall Restrictions Weight Bearing Restrictions: No    Mobility  Bed Mobility Overal bed mobility: Needs Assistance Bed Mobility: Supine to Sit     Supine to sit: Min guard     General bed mobility comments: with HOB 30 degrees and no rails used. increased time needed  Transfers Overall transfer level: Needs assistance Equipment used: Rolling walker (2 wheeled) Transfers: Sit to/from Stand Sit to Stand: Min guard         General transfer comment: cues for hand placement for safety and for anterior weight shifting to push through legs for power up, no assistance needed. cues for reaching back and use of UE's to control descent with sitting down to recliner.  Ambulation/Gait Ambulation/Gait assistance: Min assist Ambulation Distance (Feet): 80 Feet Assistive device: Rolling walker (2 wheeled) Gait Pattern/deviations: Step-through pattern;Decreased stride length;Staggering left;Trunk flexed Gait velocity: Decreased Gait velocity interpretation: Below normal speed for age/gender General Gait Details: cues on posture, walker position with gait and for step placement to widen base of support for more normalized stance. has left hand orthotic on walker now, attempted gait without it as pt has improved grip, however hand would still slide off, recommended to pt/daughter continued use of hand orthotic at this time.        Cognition Arousal/Alertness: Awake/alert Behavior During Therapy: WFL for tasks assessed/performed Overall Cognitive Status: Within Functional Limits for tasks assessed                      Exercises General Exercises - Lower Extremity Long Arc Quad: AROM;Strengthening;Both;10 reps;Seated Toe Raises: AROM;Strengthening;Both;10 reps;Seated Heel Raises: AROM;Strengthening;Both;10 reps;Seated     Pertinent Vitals/Pain Pain Assessment: No/denies pain     PT Goals (current goals can now be found in the care plan section) Acute Rehab PT Goals Patient Stated Goal: get my left hand back PT Goal Formulation: With patient/family Time For Goal Achievement: 08/16/16 Potential to Achieve Goals: Good Progress towards PT goals: Progressing toward goals    Frequency    Min 4X/week      PT Plan Current plan remains appropriate    End of Session Equipment Utilized During Treatment: Gait belt Activity Tolerance: Patient tolerated treatment well Patient left: in chair;with call bell/phone within reach;with chair alarm set;with family/visitor present;with nursing/sitter in room     Time: 4627-0350 PT Time Calculation (min) (ACUTE ONLY): 24 min  Charges:  $Gait Training: 8-22 mins $Therapeutic Exercise: 8-22 mins           Willow Ora 08/10/2016, 4:39 PM  Willow Ora, PTA, CLT Acute Rehab Services [phone] 08/10/16, 4:40 PM

## 2016-08-10 NOTE — Progress Notes (Signed)
Family Medicine Teaching Service Daily Progress Note Intern Pager: [phone]  Patient name: [name] Medical record number: [id number] Date of birth: 07-09-1924 Age: 83 y.o. Gender: male  Primary Care Provider: Lupita Dawn, MD Consultants: Vascular, Neurology Code Status: Full code  Assessment and Plan: [name] is a 83 y.o. male hx significant for an NSTEMI, CAD (s/p PCI/BMS 2009), HLD, atrial fibrillation, right leg DVT, hemoptysis (while on Xarelto), chronic ITP, CHF, and CKD stage III presenting with left facial droop, left-sided weakness and numbness.    Right frontal CVA: improving Pt reports feeling well subjectively today. Objectively, still with left ptosis, asymmetric nasolabial fold, dysarthria, decreased sensation on left side, and decreased eye-hand coordination, though significant improvement in left hand grip strength from yesterday. Notably, during the day yesterday there was concern for worsening of left sided facial asymmetry per family, but this responded well to 500 mL NS bolus (see significant event note). On admission, physical exam showed left facial droop, left sided weakness and decreased sensation. EKG showed A Fib without RVR. CT head showed two small areas of cortical infarct in the right frontal lobe, likely acute, with no acute hemorrhage. Echo 08/10/16 showed LV with mild concentric hypertrophy, EF 55-60%, no regional wall motion abnormalities. Carotid dopplers significant for right ICA stenosis (1-39%). Lipid panel wnl. PT and OT recommending home health. Discussion to be had with neurology concerning anticoagulation going forward given h/o hemoptysis while on Xarelto. -Vital signs per floor protocol -Permissive hypertension for the next 24 hours, holding home metoprolol and lisinopril -Aspirin daily 81 mg --Start artificial tears Left eye, 1 drop TID PRN -F/u brain MRI, MRA >> exams performed, awaiting radiology read --Echo with EF  55-60%, tricuspid regurg, Rt atria dilation, no evidence of PFO -F/u Hemoglobin A1c -Neuro checks q4 hours -SLP consulted -Neurology consulted, appreciate their assistance  Left leg swelling  Cold left foot: improving Per patient report, chart review, and vascular recs seems to be chronic due to CHF. Patient has had right leg DVT in the past. Adequate perfusion per vascular surgery. ABI findings significant for "left ABI within normal limits, borderline mild arterial insufficiency, at rest." - Heparin to 1000u/hr, per vascular and pharmacy recs - Daily heparin level and CBC - Discuss long term anticoagulation plan as above - Continue to monitor  Atrial fibrillation: stable Rate controlled. Home medications include metoprolol 12.5 mg twice a day and aspirin 81 mg daily. Not on any anticoagulation as patient had substantial hemoptysis when on Xarelto in the past. Xarelto was stopped at last hospitalization for CAP and afib w/ RVR in November 2017 and was not resumed per patient after discussion with his cardiologist. CHADSvasc score 7 (, HAS-BLED score 4. -Hold metoprolol 12.5 mg BID for now -Continue aspirin 81 mg daily -Continuous cardiac monitoring  Hypertension: mild, permissive Currently hypertensive to 140s over 90s. Home medications include lisinopril and metoprolol. Permissive HTN in the setting of recent CVA. -Hold metoprolol and lisinopril for permissive hypertension   HFrEF: improved Combined systolic and diastolic (grade 2) heart failure. Echo today as above. Home medications include Lasix 40 mg daily. -Hold Lasix  -Hold lisinopril and metoprolol as above  CKD stage 3: stable Cr today 1.06. Baseline creatinine around 1.2-1.3. - Daily BMP  Chronic ITP: watch Platelets 107 today. 121 on admission. CTM for plt count and bleeding. -Daily CBC  Chronic Back Pain Has DDD. Takes Lyrica --Continue home Lyrica  FEN/GI: Soft diet pending SLT consult Prophylaxis:  Heparin drip (Pharmacy  monitoring)  Disposition:  Admit to inpatient telemetry, attending Dr. Ardelia Mems. Likely discharge 1/31, barring changes in status.   Subjective:  Feeling well today. Was at ultrasound during pre-rounding this morning. Endorses dry left eye. No BM today.  Objective: Temp:  [97.9 F (36.6 C)-98.7 F (37.1 C)] 97.9 F (36.6 C) (01/30 0459) Pulse Rate:  [73-102] 87 (01/30 0459) Resp:  [13-21] 16 (01/30 0459) BP: (135-152)/(66-103) 147/84 (01/30 0459) SpO2:  [94 %-99 %] 94 % (01/30 0459) FiO2 (%):  [21 %] 21 % (01/29 1049)  Physical Exam: General: Well appearing man, pleasant, no acute distress, sitting up in bedside chair Cardiovascular: Normal rate, irregular rhythm. No m/r/g. Radial pulse 2+ bilaterally. Respiratory: Clear to auscultation bilaterally. No w/r/r. Abdomen: Non-distended.  Laboratory:  Recent Labs Lab 08/09/16 0648 08/09/16 0707 08/10/16 0323  WBC 6.7  --  6.1  HGB 13.9 15.0 13.0  HCT 43.8 44.0 41.1  PLT 121*  --  107*    Recent Labs Lab 08/09/16 0648 08/09/16 0707 08/10/16 0323  NA 140 141 140  K 4.2 4.4 4.2  CL 106 103 104  CO2 27  --  26  BUN 27* 37* 22*  CREATININE 1.32* 1.30* 1.06  CALCIUM 9.8  --  9.7  PROT 7.4  --   --   BILITOT 1.7*  --   --   ALKPHOS 63  --   --   ALT 25  --   --   AST 28  --   --   GLUCOSE 99 104* 104*   -UA with trace leukocyte esterase -Heparin level monitored by pharmacy  Imaging/Diagnostic Tests: CXR 08/09/16 IMPRESSION: Interval resolution of alveolar infiltrates bilaterally. COPD. Pulmonary fibrotic changes. No acute pneumonia.  ECHO 08/10/16 Study Conclusions - Left ventricle: The cavity size was normal. There was mild   concentric hypertrophy. Systolic function was normal. The   estimated ejection fraction was in the range of 55% to 60%. Wall   motion was normal; there were no regional wall motion   abnormalities. - Mitral valve: There was mild to moderate regurgitation  directed   eccentrically and posteriorly. - Left atrium: The atrium was mildly dilated. - Right atrium: The atrium was moderately to severely dilated. - Atrial septum: No defect or patent foramen ovale was identified. - Tricuspid valve: There was mild-moderate regurgitation directed   centrally. - Pulmonary arteries: Systolic pressure was mildly to moderately   increased. PA peak pressure: 49 mm Hg (S).  ABI Summary: The right ABI is within normal limits at rest. The left ABI is within normal limits, borderline mild arterial insufficiency, at Rest.  Carotid duplex Summary: - Right - 1% to 39% ICA stenosis. Vertebral artery flow is   antegrade. - Left - No evidence of significant ICA stenosis. Vertebral artery   flow is antegrade. Unable to visualize the subclavian well enough   to evaluate due to the clavicle.   Rosario Adie, Medical Student 08/10/2016, 8:12 AM FPTS Intern pager: [phone], text pages welcome   Upper Level Addendum:  I have seen and evaluated this patient along with Student Dr. Megan Salon and reviewed the above note, making necessary revisions in red.   Elberta Leatherwood, MD,MS,  PGY3 08/10/2016 7:18 PM \

## 2016-08-10 NOTE — Progress Notes (Signed)
STROKE TEAM PROGRESS NOTE   HISTORY OF PRESENT ILLNESS (per record) [name] is a 83 y.o. male who noted that normal around 2 AM when he walked to the bathroom. Given the fact that he has relatively good strength and has some neglect, however I'm not certain if this is a definite last seen well. He was brought to the emergency department where a code stroke was called. Given his relatively low NIH and unclear time of onset, no further intervention was considered. Of note, he has a history of atrial fibrillation and was on Xarelto, but this was stopped due to hemoptysis. Patient was not administered IV t-PA secondary to unclear last known well. He was admitted for further evaluation and treatment.   SUBJECTIVE (INTERVAL HISTORY) Wife at bedside. Pt stated that his left arm weakness much improved. Still has left facial weakness but mild too. Pt stated that he was on Xarelto for afib but developed pneumonia 05/2016 with hemoptysis which was severe. He was taken off Xarelto. Since then, he has had no recurrence of hemoptysis. It was thought the hemoptysis was associated with pneumonia. He also had hematuria in the past but that is also resolved. Pt and family are ready for resumption of Xarelto.    OBJECTIVE Temp:  [97.9 F (36.6 C)-98.7 F (37.1 C)] 98.1 F (36.7 C) (01/30 1040) Pulse Rate:  [73-102] 101 (01/30 1040) Cardiac Rhythm: Atrial fibrillation (01/29 1900) Resp:  [16-20] 20 (01/30 1040) BP: (135-149)/(66-91) 148/86 (01/30 1040) SpO2:  [94 %-98 %] 98 % (01/30 1040)  CBC:  Recent Labs Lab 08/09/16 0648 08/09/16 0707 08/10/16 0323  WBC 6.7  --  6.1  NEUTROABS 4.0  --   --   HGB 13.9 15.0 13.0  HCT 43.8 44.0 41.1  MCV 84.1  --  83.5  PLT 121*  --  107*    Basic Metabolic Panel:  Recent Labs Lab 08/09/16 0648 08/09/16 0707 08/10/16 0323  NA 140 141 140  K 4.2 4.4 4.2  CL 106 103 104  CO2 27  --  26  GLUCOSE 99 104* 104*  BUN 27* 37* 22*  CREATININE 1.32*  1.30* 1.06  CALCIUM 9.8  --  9.7    Lipid Panel:    Component Value Date/Time   CHOL 139 08/10/2016 0323   TRIG 46 08/10/2016 0323   HDL 42 08/10/2016 0323   CHOLHDL 3.3 08/10/2016 0323   VLDL 9 08/10/2016 0323   LDLCALC 88 08/10/2016 0323   HgbA1c: No results found for: HGBA1C Urine Drug Screen: No results found for: LABOPIA, COCAINSCRNUR, LABBENZ, AMPHETMU, THCU, LABBARB    IMAGING I have personally reviewed the radiological images below and agree with the radiology interpretations.  Ct Head Code Stroke W/o Cm 08/09/2016 1. Two small areas of cortical infarct in the right frontal lobe, likely acute. ASPECTS is 8 to 9. 2. Remote ischemic injury described above. 3. No acute hemorrhage.   Left lower extremity Doppler  negative for DVT, superficial thrombosis or baker's cyst  Carotid Doppler   Right - 1% to 39% ICA stenosis. Vertebral artery flow is antegrade. Left - No evidence of significant ICA stenosis. Vertebral artery flow is antegrade/  2-D echocardiogram - Left ventricle: The cavity size was normal. There was mild concentric hypertrophy. Systolic function was normal. The estimated ejection fraction was in the range of 55% to 60%. Wall motion was normal; there were no regional wall motion abnormalities. - Mitral valve: There was mild to moderate regurgitation directed eccentrically  and posteriorly. - Left atrium: The atrium was mildly dilated. - Right atrium: The atrium was moderately to severely dilated. - Atrial septum: No defect or patent foramen ovale was identified. - Tricuspid valve: There was mild-moderate regurgitation directed centrally. - Pulmonary arteries: Systolic pressure was mildly to moderately increased. PA peak pressure: 49 mm Hg (S).  Mri and Mra Brain Wo Contrast 08/10/2016 IMPRESSION: 1. Right posterior MCA distribution predominantly cortical diffusion restriction compatible with acute/early subacute infarction. No acute hemorrhage identified. 2.  Stable foci of encephalomalacia in bilateral occipital, left lateral temporal, and right inferior frontal lobes. 3. Stable moderate chronic microvascular ischemic changes and parenchymal volume loss of the brain. 4. No large vessel occlusion, aneurysm, or significant stenosis of the circle of Willis is identified.   TTE -  - Left ventricle: The cavity size was normal. There was mild   concentric hypertrophy. Systolic function was normal. The   estimated ejection fraction was in the range of 55% to 60%. Wall   motion was normal; there were no regional wall motion   abnormalities. - Mitral valve: There was mild to moderate regurgitation directed   eccentrically and posteriorly. - Left atrium: The atrium was mildly dilated. - Right atrium: The atrium was moderately to severely dilated. - Atrial septum: No defect or patent foramen ovale was identified. - Tricuspid valve: There was mild-moderate regurgitation directed   centrally. - Pulmonary arteries: Systolic pressure was mildly to moderately   increased. PA peak pressure: 49 mm Hg (S).   PHYSICAL EXAM  Temp:  [97.8 F (36.6 C)-98.6 F (37 C)] 98.6 F (37 C) (01/30 2142) Pulse Rate:  [78-102] 78 (01/30 2142) Resp:  [16-20] 20 (01/30 2142) BP: (127-149)/(75-86) 139/75 (01/30 2142) SpO2:  [94 %-99 %] 95 % (01/30 2142)  General - Well nourished, well developed, in no apparent distress.  Ophthalmologic -Fundi not visualized due to noncooperative.  Cardiovascular - irregularly irregular heart rate and rhythm.  Mental Status -  Level of arousal and orientation to time, place, and person were intact. Language including expression, naming, repetition, comprehension was assessed and found intact. Fund of Knowledge was assessed and was intact.  Cranial Nerves II - XII - II - Visual field intact OU. III, IV, VI - Extraocular movements intact. V - Facial sensation intact bilaterally. VII - mild left facial droop. VIII - Hard of hearing  & vestibular intact bilaterally. X - Palate elevates symmetrically. XI - Chin turning & shoulder shrug intact bilaterally. XII - Tongue protrusion intact.  Motor Strength - The patient's strength was normal in all extremities except left arm 4+/5 with dexterity difficulty and pronator drift was absent.  Bulk was normal and fasciculations were absent.   Motor Tone - Muscle tone was assessed at the neck and appendages and was normal.  Reflexes - The patient's reflexes were 1+ in all extremities and he had no pathological reflexes.  Sensory - Light touch, temperature/pinprick were assessed and were symmetrical.    Coordination - The patient had normal movements in the hands with no ataxia or dysmetria.  Tremor was absent.  Gait and Station - not tested.   ASSESSMENT/PLAN [name] is a 83 y.o. male with history of atrial fibrillation off Xarelto due to hemoptysis, NSTEMI, CAD status post PCI/BMS 2009, HLD, right leg DVT, chronic ITP, CHF, chronic kidney disease stage III presenting with left-sided weakness and neglect. He did not receive IV t-PA due to unclear last known well.   Stroke:  right MCA  cortical frontal infarct embolic secondary to afib not on [facility]  Resultant  Left arm mild weakness and left facial mild droop  CT head - 2 Small right frontal lobe infarcts   MRI  Right MCA cortical infarct  MRA  unremarkable  Carotid Doppler  No significant stenosis   2D Echo  EF 55-60%. No source of embolus   LDL 88  HgbA1c pending  IV heparin for VTE prophylaxis  DIET SOFT Room service appropriate? Yes; Fluid consistency: Thin  aspirin 81 mg daily prior to admission, now on aspirin 81 mg daily and heparin IV (for ischemic foot and atrial fibrillation). Recommend continue ASA and resume Xarelto on discharge.  Patient counseled to be compliant with his antithrombotic medications  Ongoing aggressive stroke risk factor management  Therapy recommendations:  HH  OT  Disposition:  Pending.   Atrial Fibrillation  Home anticoagulation:  none , Xarelto stopped over 1 month ago due to pneumonia induced hemoptysis that has since resolved  Started on IV heparin for atrial fibrillation/left foot ischemia  Hx of hematuria but resolved  Recommend to resume Xarelto on discharge    Hypertension  Stable  Permissive hypertension (OK if < 180/105) but gradually normalize in 5-7 days  Long-term BP goal normotensive  Hyperlipidemia  Home meds:  No statin  LDL 88, goal < 70  Add pravastatin 20mg   Continue statin at discharge  Other Stroke Risk Factors  Coronary artery disease s/p PCI, stent  Advanced age  Other Active Problems  Hx R leg DVT in 2015  Left foot ischemia, lower extremity Doppler negative for DVT,  started on IV heparin  - the [facility] consulted ([facility]) - patient with viable feet. ABIs done for completeness. Favors continuation of anticoagulation. Signed off.   Chronic kidney disease stage III  Chronic ITP   Chronic back pain on [facility] day # 1  Neurology will sign off. Please call with questions. Pt will follow up with Cecille Rubin NP at [facility] in about 6 weeks. Thanks for the consult.  Rosalin Hawking, MD PhD Stroke Neurology 08/10/2016 11:37 PM   To contact Stroke Continuity provider, please refer to [email or web address] After hours, contact General Neurology

## 2016-08-11 LAB — HEPARIN LEVEL (UNFRACTIONATED): HEPARIN UNFRACTIONATED: 0.89 [IU]/mL — AB (ref 0.30–0.70)

## 2016-08-11 LAB — CBC
HEMATOCRIT: 40.9 % (ref 39.0–52.0)
Hemoglobin: 13.2 g/dL (ref 13.0–17.0)
MCH: 26.9 pg (ref 26.0–34.0)
MCHC: 32.3 g/dL (ref 30.0–36.0)
MCV: 83.3 fL (ref 78.0–100.0)
Platelets: 112 10*3/uL — ABNORMAL LOW (ref 150–400)
RBC: 4.91 MIL/uL (ref 4.22–5.81)
RDW: 15.6 % — AB (ref 11.5–15.5)
WBC: 5.9 10*3/uL (ref 4.0–10.5)

## 2016-08-11 LAB — HEMOGLOBIN A1C
HEMOGLOBIN A1C: 6.3 % — AB (ref 4.8–5.6)
Mean Plasma Glucose: 134 mg/dL

## 2016-08-11 MED ORDER — POLYVINYL ALCOHOL 1.4 % OP SOLN
1.0000 [drp] | Freq: Three times a day (TID) | OPHTHALMIC | Status: DC
  Administered 2016-08-11: 1 [drp] via OPHTHALMIC
  Filled 2016-08-11: qty 15

## 2016-08-11 MED ORDER — PRAVASTATIN SODIUM 20 MG PO TABS: 20.0000 mg | ORAL_TABLET | Freq: Every day | ORAL | 1 refills | Status: DC

## 2016-08-11 MED ORDER — TETANUS-DIPHTH-ACELL PERTUSSIS 5-2.5-18.5 LF-MCG/0.5 IM SUSP
0.5000 mL | Freq: Once | INTRAMUSCULAR | Status: AC
  Administered 2016-08-11: 0.5 mL via INTRAMUSCULAR
  Filled 2016-08-11: qty 0.5

## 2016-08-11 MED ORDER — HYPROMELLOSE (GONIOSCOPIC) 2.5 % OP SOLN
1.0000 [drp] | Freq: Three times a day (TID) | OPHTHALMIC | Status: DC
  Filled 2016-08-11: qty 15

## 2016-08-11 MED ORDER — ASPIRIN 81 MG PO CHEW: 81.0000 mg | CHEWABLE_TABLET | Freq: Every day | ORAL | 0 refills | Status: DC

## 2016-08-11 MED ORDER — RIVAROXABAN 20 MG PO TABS: 20.0000 mg | ORAL_TABLET | Freq: Every day | ORAL | 0 refills | Status: DC

## 2016-08-11 NOTE — Progress Notes (Signed)
Pt discharging at this time with daughter taking all personal belongings. IV discontinued, dry dressing applied. Discharge instructions provided with verbal understanding. Pt and family aware of follow up appts. No noted distress.

## 2016-08-11 NOTE — Progress Notes (Signed)
Occupational Therapy Treatment Patient Details Name: [name] MRN: [id number] DOB: 07/14/1923 Today's Date: 08/11/2016    History of present illness Pt is  83 y/o male who presents with L facial droop, L-side weakness and numbness. Imaging revealed 2 small areas of cortical infarct at the R frontal lobe.    OT comments  Pt progressing well toward OT goals. L grasp strength is improving and pt is able to grasp large items for ADL tasks but remains unable to manipulate small fasteners. Active wrist extension limited to neutral. Pt and daughter educated on splint donning and doffing as well as wearing schedule with handout provided (see OT progress note for details). Pt required mod assist for LB dressing this session. Educated on compensatory dressing techniques as well as L hand PROM and AROM HEP. Pt would benefit from continued OT services while admitted to improve independence with ADL and functional mobility.    Follow Up Recommendations  Home health OT    Equipment Recommendations  Other (comment) (RW)    Recommendations for Other Services      Precautions / Restrictions Precautions Precautions: Fall Restrictions Weight Bearing Restrictions: No       Mobility Bed Mobility               General bed mobility comments: OOB in chair on OT arrival.  Transfers Overall transfer level: Needs assistance Equipment used: Rolling walker (2 wheeled) Transfers: Sit to/from Stand Sit to Stand: Min guard         General transfer comment: VC's for hand placement    Balance Overall balance assessment: Needs assistance Sitting-balance support: Feet supported;No upper extremity supported Sitting balance-Leahy Scale: Good     Standing balance support: No upper extremity supported;During functional activity;Single extremity supported Standing balance-Leahy Scale: Poor Standing balance comment: Reliant on RW for support                   ADL Overall ADL's :  Needs assistance/impaired                     Lower Body Dressing: Moderate assistance;Sit to/from stand Lower Body Dressing Details (indicate cue type and reason): Assist to thread feet,  Toilet Transfer: Min guard;RW             General ADL Comments: Educated pt and daughter on splint wear schedule and provided handout. They demonstrate understanding. See medical record for splint wear schedule.      Vision                 Additional Comments: Pt reporting vision deficits have cleared up. Able to use central and peripheral vision functionally.   Perception     Praxis      Cognition   Behavior During Therapy: WFL for tasks assessed/performed Overall Cognitive Status: Within Functional Limits for tasks assessed                       Extremity/Trunk Assessment               Exercises     Shoulder Instructions       General Comments      Pertinent Vitals/ Pain       Pain Assessment: No/denies pain Faces Pain Scale: Hurts a little bit Pain Intervention(s): Monitored during session  Home Living       Type of Home: House  Prior Functioning/Environment              Frequency  Min 3X/week        Progress Toward Goals  OT Goals(current goals can now be found in the care plan section)  Progress towards OT goals: Progressing toward goals  Acute Rehab OT Goals Patient Stated Goal: get my left hand back OT Goal Formulation: With patient/family Time For Goal Achievement: 08/23/16 Potential to Achieve Goals: Good ADL Goals Pt Will Transfer to Toilet: with min guard assist;bedside commode;ambulating (RW) Additional ADL Goal #1: Pt will use R hand to place L hand in walker splint min guard (A) as precursor to adls. Additional ADL Goal #2: Pt will complete don doff of L hand splint and verbalize wear schedule mod I with family  Additional ADL Goal #3: Pt will complete 3 visual  exercises with less than 2 errors   Plan Discharge plan remains appropriate    Co-evaluation                 End of Session Equipment Utilized During Treatment: Gait belt;Rolling walker (L wrist cock-up splint)   Activity Tolerance Patient tolerated treatment well   Patient Left in chair;with call bell/phone within reach;with chair alarm set;with family/visitor present   Nurse Communication Precautions;Mobility status        Time: 3825-0539 OT Time Calculation (min): 39 min  Charges: OT General Charges $OT Visit: 1 Procedure OT Treatments $Self Care/Home Management : 23-37 mins $Orthotics Fit/Training: 8-22 mins  Norman Herrlich, OTR/L [phone] 08/11/2016, 1:40 PM

## 2016-08-11 NOTE — Discharge Instructions (Signed)
You were admitted for a new right frontal stroke. Neurology followed your care and recommended we restart your Xarelto and continue aspirin daily. In addition, we have started you on a medication called pravastatin 20 mg daily. Please restart your metoprolol on discharge for your atrial fibrillation but wait and take her lisinopril at your PCP follow-up scheduled on 08/12/2016 at 10:45 in the morning.

## 2016-08-11 NOTE — Progress Notes (Signed)
OT NOTE  RN STAFF  Please check splint every 4 hours to assess for: * pain * redness *swelling *skin break down  If any symptoms above are present remove splint for 15 minutes. If symptoms continue - keep the splint removed and notify OT staff [phone] immediately.    Splint can be cleaned with warm soapy water . Splint should not come in contact with any type of heat because the splint will mold into a new shape.   Splint should be worn at night. Wearing during the day during functional activities such as feeding yourself may be helpful.   Give skin a break every four hours if wearing when awake.     Thanks, [place], [place]

## 2016-08-11 NOTE — Evaluation (Signed)
Speech Language Pathology Evaluation Patient Details Name: MATH BRAZIE MRN: [id number] DOB: 02-06-1924 Today's Date: 08/11/2016 Time: 1660-6301 SLP Time Calculation (min) (ACUTE ONLY): 24 min  Problem List:  Patient Active Problem List   Diagnosis Date Noted  . Hyperlipidemia   . CVA (cerebral vascular accident) ([place]) 08/09/2016  . Stroke ([place]) 08/09/2016  . Cold extremity without peripheral vascular disease   . Abnormal CT of the chest 07/21/2016  . Acute on chronic combined systolic and diastolic congestive heart failure ([place]) 06/17/2016  . Tinea pedis 05/27/2016  . NSTEMI (non-ST elevated myocardial infarction) ([place]) 05/16/2016  . Chronic atrial fibrillation ([place]) 05/16/2016  . Hemoptysis 05/16/2016  . Elevated troponin   . Chronic ITP (idiopathic thrombocytopenia) ([facility])   . CHF (congestive heart failure), NYHA class II, chronic, diastolic ([place])   . CAP (community acquired pneumonia) 05/15/2016  . Microscopic hematuria 09/28/2014  . Gait instability 09/27/2014  . Coronary atherosclerosis of native coronary artery   . Sinus bradycardia   . Paroxysmal atrial flutter ([place])   . Pure hypercholesterolemia   . HTN (hypertension)   . Right leg DVT ([place])   . Degenerative disk disease 12/20/2010   Past Medical History:  Past Medical History:  Diagnosis Date  . Coronary atherosclerosis of native coronary artery    a. Anterior STEMI 2009 s/p BMSx2 to mid & prox LAD and angioplasty to distal LAD. total RCA with collaterals, moderate Cx plaquing. a. EF 55-60% in 2013.  [name] hematuria   . Gross hematuria 11/18/2008   Qualifier: Diagnosis of  By: Owens Shark, RN, BSN, [name]    . HTN (hypertension)   . Myocardial infarct 03/21/2008  . NSVT (nonsustained ventricular tachycardia) ([place])    a. Per DC summary from time of STEMI 2009.  [name] Paroxysmal atrial flutter ([place])    a. Abnl EKG 01/2012 concerning for atrial flutter, event monitor showed sinus bradycardia only and no pauses. Not  felt to be a candidate for longterm anticoag due to hematuria.  . Phlebitis and thrombophlebitis of superficial vessels of lower extremities   . Pure hypercholesterolemia    a. Has not tolerated statins in the past.  . Right leg DVT ([place])    a. Dx 01/2014.  [name] Sinus bradycardia    a. By prior event monitor.  . Thrombocytopenia ([place])   . Urinary retention 06/20/2011   Past Surgical History:  Past Surgical History:  Procedure Laterality Date  . CARPAL TUNNEL RELEASE    . CORONARY STENT PLACEMENT  2009  . REPLACEMENT TOTAL KNEE BILATERAL     Left 2014 (Dr. Eddie Dibbles); right 2012 (Dr. Redmond Pulling)  . trigger finger surgery     HPI:  Ptis a 83 y.o.malewho presented with left facial droop, left-sided weakness and numbness. PMH significant for an N STEMI, CAD s/p PCI/BMS 2009, HLD, atrial fibrillation, right leg DVT, hemoptysis worsen by Xarelto, chronic ITP, CHF, CKD stage III. CT of head showed two small areas of cortical infarct in the right frontal lobe. MRI of brain showed right posterior MCA distribution predominantly cortical diffusion restriction compatible with acute/early subacute infarction. No acute hemorrhage identified.   Assessment / Plan / Recommendation Clinical Impression  Mr. Monje is a very pleasant gentleman who reported little difficulty with his thinking/cognition (daughter confirmed and agreed) and pt endorses mild memory deficits, particularly for names. Subtests of the Cognistat were administered to assess the pt's cognitive abilities and functioning. Mr. Luellen performed within the average range on all components with the exception of memory  and abstract reasoning (similarities) which reflected only mild impairments. Pt and daughter state he is being discharged later today. Educated pt and daughter re: pt's performance. No ST treatment recommended.    SLP Assessment  Patient does not need any further Speech Lanaguage Pathology Services    Follow Up Recommendations   None    Frequency and Duration Other (Comment) (none)         SLP Evaluation Cognition  Overall Cognitive Status: Within Functional Limits for tasks assessed Arousal/Alertness: Awake/alert Orientation Level: Oriented X4 Attention: Sustained Sustained Attention: Appears intact Memory: Impaired Memory Impairment: Retrieval deficit (mild) Awareness: Appears intact Problem Solving: Appears intact Safety/Judgment: Appears intact       Comprehension  Auditory Comprehension Overall Auditory Comprehension: Appears within functional limits for tasks assessed Yes/No Questions: Not tested Commands: Within Functional Limits Conversation: Complex Interfering Components: Hearing EffectiveTechniques: Increased volume Visual Recognition/Discrimination Discrimination: Not tested Reading Comprehension Reading Status: Not tested    Expression Expression Primary Mode of Expression: Verbal Verbal Expression Overall Verbal Expression: Appears within functional limits for tasks assessed Initiation: No impairment Level of Generative/Spontaneous Verbalization: Conversation Repetition: No impairment Naming: No impairment Pragmatics: No impairment Written Expression Dominant Hand: Right Written Expression: Not tested   Oral / Motor  Oral Motor/Sensory Function Overall Oral Motor/Sensory Function: Other (comment) (not assessed) Motor Speech Overall Motor Speech: Appears within functional limits for tasks assessed   GO                    Fransisca Kaufmann , Student-SLP 08/11/2016, 11:49 AM

## 2016-08-11 NOTE — Progress Notes (Signed)
ANTICOAGULATION CONSULT NOTE - Follow Up Consult  Pharmacy Consult for Heparin  Indication: atrial fibrillation and L foot ischemia  Allergies  Allergen Reactions  . Prednisone Other (See Comments)    Does not feel right  . Rosuvastatin Other (See Comments)    Aches    Vital Signs: Temp: 98.2 F (36.8 C) (01/31 0554) Temp Source: Oral (01/31 0554) BP: 138/81 (01/31 0554) Pulse Rate: 81 (01/31 0554)  Labs:  Recent Labs  08/09/16 0648 08/09/16 0707 08/10/16 0323 08/10/16 1330 08/11/16 0605  HGB 13.9 15.0 13.0  --   --   HCT 43.8 44.0 41.1  --   --   PLT 121*  --  107*  --   --   APTT 34  --   --   --   --   LABPROT 14.2  --   --   --   --   INR 1.10  --   --   --   --   HEPARINUNFRC  --   --  0.27* 0.33 0.89*  CREATININE 1.32* 1.30* 1.06  --   --     Estimated Creatinine Clearance: 47.8 mL/min (by C-G formula based on SCr of 1.06 mg/dL).  Assessment: 83 yo male admitted with acute CVA (did not receive TPA) and left foot ischemia. Pt has hx of chronic atrial fibrillation but was not on a/c prior to arrival as xarelto was stopped over a month ago 2/2 hemoptysis that has since resolved. Pharmacy asked to start heparin for atrial fibrillation/ left foot ischemia. Heparin level is now at high on 1000 units/hr  Goal of Therapy:  Heparin level 0.3-0.5 units/ml Monitor platelets by anticoagulation protocol: Yes   Plan:  -Dec heparin to 800 units/hr -1500 HL  Narda Bonds 08/11/2016,7:09 AM

## 2016-08-11 NOTE — Care Management Note (Signed)
Case Management Note  Patient Details  Name: [name] MRN: [id number] Date of Birth: 07-20-1923  Subjective/Objective:                    Action/Plan: Pt discharging home with [facility] services. CM met with the patient and his daughter and provided a list of [facility] agencies. They have used [facility] in the past and would like to use them again. Santiago Glad with [facility] notified and accepted the referral. Pt states he has all the DME needed at home. Pt's daughter to provide transportation home and stay with patient over the next few days.   Expected Discharge Date:  08/11/16               Expected Discharge Plan:  [place]  In-House Referral:     Discharge planning Services  CM Consult  Post Acute Care Choice:  Home Health Choice offered to:  Patient, Adult Children  DME Arranged:    DME Agency:     HH Arranged:  PT, OT HH Agency:  [place]  Status of Service:  Completed, signed off  If discussed at [place] of Stay Meetings, dates discussed:    Additional Comments:  Pollie Friar, RN 08/11/2016, 3:26 PM

## 2016-08-11 NOTE — Progress Notes (Signed)
Family Medicine Teaching Service Daily Progress Note Intern Pager: [phone]  Patient name: [name] Medical record number: [id number] Date of birth: Sep 03, 1923 Age: 83 y.o. Gender: male  Primary Care Provider: Lupita Dawn, MD Consultants: Vascular, Neurology Code Status: Full code  Assessment and Plan: [name] is a 83 y.o. male hx significant for an NSTEMI, CAD (s/p PCI/BMS 2009), HLD, atrial fibrillation, right leg DVT, hemoptysis (while on Xarelto), chronic ITP, CHF, and CKD stage III presenting with left facial droop, left-sided weakness and numbness.    #Right frontal CVA: Acute, improving. MRI and MRA consistent with CT findings for acute ischemic stroke. Symptoms overall have greatly improved since admission with left upper extremity. He is to have mild left ptosis. PT/OT recommend home health. SLP pending. Neurology signing off given improvement of neurological status with recommendations to continue Xarelto use along with aspirin on discharge. --Neurology consulted, appreciate recommendations, signed off --Neurology recommends continuing Xarelto and aspirin upon discharge --Pravastatin 20 mg daily --Holding home metoprolol and lisinopril given normotensive to low blood pressures --Start artificial tears Left eye, 1 drop TID PRN --SLP pending  #Cold left foot: Acute, resolved. Per patient report, chart review, and vascular recs seems to be chronic due to CHF. Patient has had right leg DVT in the past. Adequate perfusion per vascular surgery. ABI findings significant for "left ABI within normal limits, borderline mild arterial insufficiency, at rest." The with strong pulse and normal temperature bilaterally this morning. --Heparin to 1000u/hr, per vascular and pharmacy recs, sitter discontinuing and switching to Xarelto for discharge --Daily heparin level and CBC --Continue to monitor  #Atrial fibrillation: Chronic, stable. Rate controlled. Home  medications include metoprolol 12.5 mg twice a day and aspirin 81 mg daily. Not on any anticoagulation as patient had substantial hemoptysis when on Xarelto in the past. Xarelto was stopped at last hospitalization for CAP and afib w/ RVR in November 2017 and was not resumed per patient after discussion with his cardiologist. CHADSvasc score 7 (, HAS-BLED score 4. --Hold metoprolol 12.5 mg BID for now --Continue aspirin 81 mg daily and restart Xarelto on discharge --Continuous cardiac monitoring  #Hypertension: Chronic, stable. Currently hypertensive to 140s over 90s. Home medications include lisinopril and metoprolol. Permissive HTN in the setting of recent CVA. --Hold metoprolol and lisinopril given low blood pressure   #HFrEF: Chronic, stable. Combined systolic and diastolic (grade 2) heart failure. Echo today as above. Home medications include Lasix 40 mg daily. --Hold Lasix  --Hold lisinopril and metoprolol as above  #CKD stage 3: Chronic, stable. Cr today 1.06. Baseline creatinine around 1.2-1.3. --Daily BMP  #Chronic ITP: Chronic, stable. Platelets 107 today. 121 on admission. CTM for plt count and bleeding. --Daily CBC  #Chronic Back Pain: Chronic, stable. Has DDD. Takes Lyrica --Continue home Lyrica  FEN/GI: Soft diet pending SLP consult Prophylaxis: Heparin drip (Pharmacy monitoring)  Disposition: Likely discharge later today.  Subjective:  Patient feels great this morning accompanied by wife. Says his left hand is near baseline. Continues to have left-sided facial droop otherwise feeling great this morning. Says he is ready for discharge.  Objective: Temp:  [97.8 F (36.6 C)-98.6 F (37 C)] 98.2 F (36.8 C) (01/31 0554) Pulse Rate:  [78-101] 81 (01/31 0554) Resp:  [16-20] 20 (01/31 0554) BP: (127-148)/(75-88) 138/81 (01/31 0554) SpO2:  [95 %-99 %] 97 % (01/31 0554)  Physical Exam: General: well nourished, well developed, in no acute distress with non-toxic  appearance HEENT: normocephalic, atraumatic, moist mucous membranes CV: regular  rate and rhythm without murmurs, rubs, or gallops Lungs: clear to auscultation bilaterally with normal work of breathing Skin: warm, dry, no rashes or lesions, cap refill < 2 seconds Extremities: warm and well perfused, normal tone Neuro: Mild left ptosis, 4/5 left upper extremity strength with minimal decrease in sensation at hand, all other extremities 5/5 motor strength, no slurring of speech  Laboratory:  Recent Labs Lab 08/09/16 0648 08/09/16 0707 08/10/16 0323  WBC 6.7  --  6.1  HGB 13.9 15.0 13.0  HCT 43.8 44.0 41.1  PLT 121*  --  107*    Recent Labs Lab 08/09/16 0648 08/09/16 0707 08/10/16 0323  NA 140 141 140  K 4.2 4.4 4.2  CL 106 103 104  CO2 27  --  26  BUN 27* 37* 22*  CREATININE 1.32* 1.30* 1.06  CALCIUM 9.8  --  9.7  PROT 7.4  --   --   BILITOT 1.7*  --   --   ALKPHOS 63  --   --   ALT 25  --   --   AST 28  --   --   GLUCOSE 99 104* 104*   Urinalysis    Component Value Date/Time   COLORURINE YELLOW 08/09/2016 0921   APPEARANCEUR CLEAR 08/09/2016 0921   LABSPEC 1.013 08/09/2016 0921   PHURINE 8.0 08/09/2016 0921   GLUCOSEU NEGATIVE 08/09/2016 0921   HGBUR NEGATIVE 08/09/2016 0921   BILIRUBINUR NEGATIVE 08/09/2016 0921   KETONESUR NEGATIVE 08/09/2016 0921   PROTEINUR NEGATIVE 08/09/2016 0921   UROBILINOGEN 0.2 03/13/2015 0910   NITRITE NEGATIVE 08/09/2016 0921   LEUKOCYTESUR TRACE (A) 08/09/2016 0921   Lipid Panel     Component Value Date/Time   CHOL 139 08/10/2016 0323   TRIG 46 08/10/2016 0323   HDL 42 08/10/2016 0323   CHOLHDL 3.3 08/10/2016 0323   VLDL 9 08/10/2016 0323   LDLCALC 88 08/10/2016 0323   Troponin: 0.01 Urine culture: Pending A1c: 6.3  Imaging/Diagnostic Tests: [name] Contrast / MRA Head/Brain Wo Cm (08/10/2016) FINDINGS: MRI HEAD FINDINGS  Brain: Predominantly cortical diffusion restriction within the right posterior MCA  distribution involving the perisylvian frontal and temporal lobes, insula, and the right parietal lobe compatible with acute/ early subacute infarction. There is T2 FLAIR hyperintense signal abnormality and minimal local mass effect associated with the areas of ischemia. No associate hemorrhage.  Stable foci of encephalomalacia within the left occipital lobe, right occipital lobe, left lateral temporal lobe, and right inferior frontal lobe. Left lateral temporal encephalomalacia has hemosiderin staining. Scattered foci of T2 FLAIR hyperintensity throughout subcortical and periventricular white matter is compatible with moderate chronic microvascular ischemic changes. Mild brain parenchymal volume loss. No hydrocephalus. No extra-axial collection. Ex vacuo dilatation of the occipital horn of the left lateral ventricle.  Vascular: As below.  Skull and upper cervical spine: Normal marrow signal.  Sinuses/Orbits: Right maxillary sinus mucosal thickening. Otherwise no abnormal signal of mastoid air cells or paranasal sinuses. Bilateral intra-ocular lens replacement.  Other: None.  MRA HEAD FINDINGS  Internal carotid arteries: No large vessel occlusion, aneurysm, or significant stenosis is identified.  Anterior cerebral arteries: No large vessel occlusion, aneurysm, or significant stenosis is identified.  Middle cerebral arteries: No large vessel occlusion, aneurysm, or significant stenosis is identified.  Anterior communicating artery: Patent.  Posterior communicating arteries: Not identified, likely hypoplastic or absent.  Posterior cerebral arteries: No large vessel occlusion, aneurysm, or significant stenosis is identified.  Basilar artery: No large vessel occlusion, aneurysm,  or significant stenosis is identified.  Vertebral arteries: No large vessel occlusion, aneurysm, or significant stenosis is identified.  IMPRESSION: 1. Right posterior MCA distribution predominantly  cortical diffusion restriction compatible with acute/early subacute infarction. No acute hemorrhage identified. 2. Stable foci of encephalomalacia in bilateral occipital, left lateral temporal, and right inferior frontal lobes. 3. Stable moderate chronic microvascular ischemic changes and parenchymal volume loss of the brain. 4. No large vessel occlusion, aneurysm, or significant stenosis of the circle of Willis is identified. These results will be called to the ordering clinician or representative by the Radiologist Assistant, and communication documented in the PACS or zVision Dashboard.  ECHO (08/10/2016) Study Conclusions - Left ventricle: The cavity size was normal. There was mild   concentric hypertrophy. Systolic function was normal. The   estimated ejection fraction was in the range of 55% to 60%. Wall   motion was normal; there were no regional wall motion   abnormalities. - Mitral valve: There was mild to moderate regurgitation directed   eccentrically and posteriorly. - Left atrium: The atrium was mildly dilated. - Right atrium: The atrium was moderately to severely dilated. - Atrial septum: No defect or patent foramen ovale was identified. - Tricuspid valve: There was mild-moderate regurgitation directed   centrally. - Pulmonary arteries: Systolic pressure was mildly to moderately   increased. PA peak pressure: 49 mm Hg (S).  ABI (08/10/2016) Summary: The right ABI is within normal limits at rest. The left ABI is within normal limits, borderline mild arterial insufficiency, at Rest.  Carotid duplex (08/10/2016) Summary: - Right - 1% to 39% ICA stenosis. Vertebral artery flow is   antegrade. - Left - No evidence of significant ICA stenosis. Vertebral artery   flow is antegrade. Unable to visualize the subclavian well enough   to evaluate due to the clavicle.  CXR (08/08/2016) IMPRESSION: Interval resolution of alveolar infiltrates bilaterally. COPD. Pulmonary  fibrotic changes. No acute pneumonia.    Albion Bing, DO 08/11/2016, 7:10 AM FPTS Intern pager: [phone], text pages welcome

## 2016-08-12 NOTE — Discharge Summary (Signed)
[facility] Discharge Summary  Patient name: [name] Medical record number: [id number] Date of birth: [birth year] Age: 83 y.o. Gender: male Date of Admission: 08/09/2016  Date of Discharge: 08/11/2016 Admitting Physician: Leeanne Rio, MD  Primary Care Provider: Lupita Dawn, MD Consultants: Vascular, Neurology  Indication for Hospitalization: Acute ischemic stroke  Discharge Diagnoses/Problem List:  Right frontal ischemic CVA Cold left foot Atrial fibrillation Hypertension HFrEF Chronic kidney disease stage III Chronic ITP Chronic back pain  Disposition: Home with HHPT/OT  Discharge Condition: Stable, improved  Discharge Exam:  General: well nourished, well developed, in no acute distress with non-toxic appearance HEENT: normocephalic, atraumatic, moist mucous membranes CV: regular rate and rhythm without murmurs, rubs, or gallops Lungs: clear to auscultation bilaterally with normal work of breathing Skin: warm, dry, no rashes or lesions, cap refill < 2 seconds Extremities: warm and well perfused, normal tone Neuro: Mild left ptosis, 4/5 left upper extremity strength with minimal decrease in sensation at hand, all other extremities 5/5 motor strength, no slurring of speech  Brief Hospital Course:  [name] a 83 y.o.malehx significant for an NSTEMI, CAD (s/p PCI/BMS 2009), HLD, atrial fibrillation, right leg DVT, hemoptysis (while on Xarelto), chronic ITP, CHF, and CKD stage III presenting with left facial droop, left-sided weakness and numbness.  Patient experienced difficulty using left arm due to weakness and numbness after awakening. Patient was noted to have left facial droop and was transported by EMS to [facility] ED. She denied loss of vision, vertigo, light-headedness, syncope, chest pain, shortness of breath, nausea or vomiting.  Upon arrival to ED, neurology was consulted. EKG showed A Fib without RVR. CT  head showed two small areas of cortical infarct in the right frontal lobe, likely acute, with no acute hemorrhage. MRI and MRA consistent with CT findings for acute ischemic stroke. Echo 08/10/16 showed LV with mild concentric hypertrophy, EF 55-60%, no regional wall motion abnormalities. Carotid dopplers significant for right ICA stenosis (1-39%). Lipid panel wnl. Patient's symptoms improved during hospitalization. PT and OT recommending home health. Patient instructed to continue Xarelto and aspirin on discharge.  During hospital course, patient was noted to have acute onset left cold foot. Vascular was consulted concerning for ischemic left lower extremity. Patient underwent arterial duplex and was started on heparin drip. Cold result and vascular signed off without recommendations for follow-up given high risk for surgery and resolution of symptoms. Patient transitioned to Xarelto as stated above.  Issues for Follow Up:  1. Neurology recommends restarting Xarelto and continuing aspirin upon discharge. Refills prescribed. Patient instructed to continue statin 20 mg daily on discharge. 2. Blood pressure improved during hospitalization. Patient restarted on Toprol before discharge given a-fib hx. Held lisinopril. Recheck BMET follow-up and consider restarting ACE-I. 3. Vascular signed off after improvement of left cold foot with heparin drip. Transitioned his Xarelto on discharge. Patient deemed high-risk for surgery. 4. PT/OT recommends home health PT/OT. Arrangements made prior to discharge. Speech evaluation without need for follow-up. 5. Patient received Tdap prior to discharge given recent injury to left hand and no documented Tdap in the last 10 years.  Significant Procedures: None  Significant Labs and Imaging:   Recent Labs Lab 08/09/16 0648 08/09/16 0707 08/10/16 0323 08/11/16 0605  WBC 6.7  --  6.1 5.9  HGB 13.9 15.0 13.0 13.2  HCT 43.8 44.0 41.1 40.9  PLT 121*  --  107* 112*     Recent Labs Lab 08/09/16 0648 08/09/16 0707 08/10/16  0323  NA 140 141 140  K 4.2 4.4 4.2  CL 106 103 104  CO2 27  --  26  GLUCOSE 99 104* 104*  BUN 27* 37* 22*  CREATININE 1.32* 1.30* 1.06  CALCIUM 9.8  --  9.7  ALKPHOS 63  --   --   AST 28  --   --   ALT 25  --   --   ALBUMIN 4.1  --   --    Urinalysis    Component Value Date/Time   COLORURINE YELLOW 08/09/2016 0921   APPEARANCEUR CLEAR 08/09/2016 0921   LABSPEC 1.013 08/09/2016 0921   PHURINE 8.0 08/09/2016 0921   GLUCOSEU NEGATIVE 08/09/2016 0921   HGBUR NEGATIVE 08/09/2016 0921   BILIRUBINUR NEGATIVE 08/09/2016 0921   KETONESUR NEGATIVE 08/09/2016 0921   PROTEINUR NEGATIVE 08/09/2016 0921   UROBILINOGEN 0.2 03/13/2015 0910   NITRITE NEGATIVE 08/09/2016 0921   LEUKOCYTESUR TRACE (A) 08/09/2016 0921    Lipid Panel     Component Value Date/Time   CHOL 139 08/10/2016 0323   TRIG 46 08/10/2016 0323   HDL 42 08/10/2016 0323   CHOLHDL 3.3 08/10/2016 0323   VLDL 9 08/10/2016 0323   LDLCALC 88 08/10/2016 0323   Troponin: 0.01 Urine culture: Pending A1c: 6.3  Imaging/Diagnostic Tests: [name] Contrast / MRA Head/Brain Wo Cm (08/10/2016) FINDINGS: MRI HEAD FINDINGS  Brain: Predominantly cortical diffusion restriction within the right posterior MCA distribution involving the perisylvian frontal and temporal lobes, insula, and the right parietal lobe compatible with acute/ early subacute infarction. There is T2 FLAIR hyperintense signal abnormality and minimal local mass effect associated with the areas of ischemia. No associate hemorrhage.  Stable foci of encephalomalacia within the left occipital lobe, right occipital lobe, left lateral temporal lobe, and right inferior frontal lobe. Left lateral temporal encephalomalacia has hemosiderin staining. Scattered foci of T2 FLAIR hyperintensity throughout subcortical and periventricular white matter is compatible with moderate chronic microvascular ischemic  changes. Mild brain parenchymal volume loss. No hydrocephalus. No extra-axial collection. Ex vacuo dilatation of the occipital horn of the left lateral ventricle.  Vascular: As below.  Skull and upper cervical spine: Normal marrow signal.  Sinuses/Orbits: Right maxillary sinus mucosal thickening. Otherwise no abnormal signal of mastoid air cells or paranasal sinuses. Bilateral intra-ocular lens replacement.  Other: None.  MRA HEAD FINDINGS  Internal carotid arteries: No large vessel occlusion, aneurysm, or significant stenosis is identified.  Anterior cerebral arteries: No large vessel occlusion, aneurysm, or significant stenosis is identified.  Middle cerebral arteries: No large vessel occlusion, aneurysm, or significant stenosis is identified.  Anterior communicating artery: Patent.  Posterior communicating arteries: Not identified, likely hypoplastic or absent.  Posterior cerebral arteries: No large vessel occlusion, aneurysm, or significant stenosis is identified.  Basilar artery: No large vessel occlusion, aneurysm, or significant stenosis is identified.  Vertebral arteries: No large vessel occlusion, aneurysm, or significant stenosis is identified.  IMPRESSION: 1. Right posterior MCA distribution predominantly cortical diffusion restriction compatible with acute/early subacute infarction. No acute hemorrhage identified. 2. Stable foci of encephalomalacia in bilateral occipital, left lateral temporal, and right inferior frontal lobes. 3. Stable moderate chronic microvascular ischemic changes and parenchymal volume loss of the brain. 4. No large vessel occlusion, aneurysm, or significant stenosis of the circle of Willis is identified. These results will be called to the ordering clinician or representative by the Radiologist Assistant, and communication documented in the PACS or zVision Dashboard.  ECHO (08/10/2016) Study Conclusions - Left ventricle:  The  cavity size was normal. There was mild concentric hypertrophy. Systolic function was normal. The estimated ejection fraction was in the range of 55% to 60%. Wall motion was normal; there were no regional wall motion abnormalities. - Mitral valve: There was mild to moderate regurgitation directed eccentrically and posteriorly. - Left atrium: The atrium was mildly dilated. - Right atrium: The atrium was moderately to severely dilated. - Atrial septum: No defect or patent foramen ovale was identified. - Tricuspid valve: There was mild-moderate regurgitation directed centrally. - Pulmonary arteries: Systolic pressure was mildly to moderately increased. PA peak pressure: 49 mm Hg (S).  ABI (08/10/2016) Summary: The right ABI is within normal limits at rest. The left ABI is within normal limits, borderline mild arterial insufficiency, at Rest.  Carotid duplex (08/10/2016) Summary: - Right - 1% to 39% ICA stenosis. Vertebral artery flow is antegrade. - Left - No evidence of significant ICA stenosis. Vertebral artery flow is antegrade. Unable to visualize the subclavian well enough to evaluate due to the clavicle.  CXR (08/08/2016) IMPRESSION: Interval resolution of alveolar infiltrates bilaterally. COPD. Pulmonary fibrotic changes. No acute pneumonia.  Results/Tests Pending at Time of Discharge: None  Discharge Medications:  Allergies as of 08/11/2016      Reactions   Prednisone Other (See Comments)   Does not feel right   Rosuvastatin Other (See Comments)   Aches      Medication List    STOP taking these medications   furosemide 20 MG tablet Commonly known as:  LASIX   lisinopril 2.5 MG tablet Commonly known as:  PRINIVIL,ZESTRIL     TAKE these medications   aspirin 81 MG tablet Take 1 tablet (81 mg total) by mouth daily. What changed:  Another medication with the same name was added. Make sure you understand how and when to take each.    aspirin 81 MG chewable tablet Chew 1 tablet (81 mg total) by mouth daily. What changed:  You were already taking a medication with the same name, and this prescription was added. Make sure you understand how and when to take each.   clotrimazole 1 % cream Commonly known as:  LOTRIMIN Apply 1 application topically 2 (two) times daily as needed (to site). Apply to affected area bid for 7 days.   fluticasone 50 MCG/ACT nasal spray Commonly known as:  FLONASE Place 2 sprays into both nostrils daily.   metoprolol tartrate 25 MG tablet Commonly known as:  LOPRESSOR TAKE 1/2 TABLET BY MOUTH 2 TIMES DAILY   nitroGLYCERIN 0.4 MG SL tablet Commonly known as:  NITROSTAT Place 0.4 mg under the tongue every 5 (five) minutes as needed for chest pain.   pravastatin 20 MG tablet Commonly known as:  PRAVACHOL Take 1 tablet (20 mg total) by mouth daily at 6 PM.   pregabalin 75 MG capsule Commonly known as:  LYRICA Take 1 capsule (75 mg total) by mouth 2 (two) times daily.   RED YEAST RICE PO Take 1,200 mg by mouth daily.   rivaroxaban 20 MG Tabs tablet Commonly known as:  XARELTO Take 1 tablet (20 mg total) by mouth daily with supper.       Discharge Instructions: Please refer to Patient Instructions section of EMR for full details.  Patient was counseled important signs and symptoms that should prompt return to medical care, changes in medications, dietary instructions, activity restrictions, and follow up appointments.   Follow-Up Appointments: Follow-up Information    Dennie Bible, NP. Schedule an appointment as soon as  possible for a visit in 6 week(s).   Specialty:  Family Medicine Contact information: [address] [place] [address] [phone]        Lupita Dawn, MD. Go on 08/20/2016.   Specialty:  Family Medicine Why:  Go to appointment at 11:30 AM. Contact information: [place] [address] [phone]        Mercy Riding, MD. Go on 08/13/2016.   Specialty:  Family Medicine Why:  Go to appointment at 10:45 AM. Contact information: [place] [address] [phone]           Avella Bing, DO 08/12/2016, 3:36 PM PGY-1, [place]

## 2016-08-13 ENCOUNTER — Ambulatory Visit (INDEPENDENT_AMBULATORY_CARE_PROVIDER_SITE_OTHER): Payer: Medicare Other | Admitting: Student

## 2016-08-13 VITALS — BP 110/68 | HR 78 | Temp 97.9°F | Ht 70.0 in | Wt 166.4 lb

## 2016-08-13 DIAGNOSIS — I482 Chronic atrial fibrillation, unspecified: Secondary | ICD-10-CM

## 2016-08-13 DIAGNOSIS — I739 Peripheral vascular disease, unspecified: Secondary | ICD-10-CM | POA: Diagnosis not present

## 2016-08-13 DIAGNOSIS — S61411A Laceration without foreign body of right hand, initial encounter: Secondary | ICD-10-CM | POA: Diagnosis not present

## 2016-08-13 DIAGNOSIS — I639 Cerebral infarction, unspecified: Secondary | ICD-10-CM

## 2016-08-13 HISTORY — DX: Peripheral vascular disease, unspecified: I73.9

## 2016-08-13 NOTE — Progress Notes (Signed)
Subjective:    [name] is a 83 y.o. old male here for hospital follow-up on CVA, ischemic left lower extremity and atrial fibrillation. Patient is here with his daughter. Patient has no concerns today.  HPI CVA: Patient had right posterior MCA stroke with left hand weakness. He was started on Xarelto by neurology during his recent hospitalization given history of atrial fibrillation and ischemic left lower extremity. He is also on aspirin 81 mg daily. He reports tolerating his medication. He stated that the left arm weakness has almost gone. He denies weakness, numbness or tingling in his extremities. They are still waiting on the physical therapy to come out.   Atrial fibrillation: Is rate controlled with metoprolol 12.5 mg twice a day. He is also on Xarelto and aspirin.   Ischemic left lower extremity: Patient was evaluated by vascular surgery at his hospitalization. He is on Xarelto and aspirin. He continues to have cold left lower extremity. Daughter thinks this is getting better.   Left hand wound: Patient had a left hand laceration when he was getting his MRI at his recent hospitalization. Daughter has been doing dressing change. Denies further bleeding, discharge, swelling or fever.   PMH/Problem List: has Degenerative disk disease; Coronary atherosclerosis of native coronary artery; Sinus bradycardia; Paroxysmal atrial flutter ([place]); Pure hypercholesterolemia; HTN (hypertension); Right leg DVT ([place]); Gait instability; Microscopic hematuria; CAP (community acquired pneumonia); NSTEMI (non-ST elevated myocardial infarction) ([place]); Chronic atrial fibrillation ([place]); Hemoptysis; Elevated troponin; Chronic ITP (idiopathic thrombocytopenia) (HCC); CHF (congestive heart failure), NYHA class II, chronic, diastolic ([place]); Tinea pedis; Acute on chronic combined systolic and diastolic congestive heart failure ([place]); Abnormal CT of the chest; CVA (cerebral vascular accident) ([place]); Cerebrovascular  accident (CVA) ([place]); Cold extremity without peripheral vascular disease; Hyperlipidemia; PAD (peripheral artery disease) ([place]); and Laceration of skin of right hand on his problem list.   has a past medical history of Coronary atherosclerosis of native coronary artery; Gross hematuria; Gross hematuria (11/18/2008); HTN (hypertension); Myocardial infarct (03/21/2008); NSVT (nonsustained ventricular tachycardia) ([place]); Paroxysmal atrial flutter ([place]); Phlebitis and thrombophlebitis of superficial vessels of lower extremities; Pure hypercholesterolemia; Right leg DVT ([place]); Sinus bradycardia; Thrombocytopenia ([place]); and Urinary retention (06/20/2011).  FH:  Family History  Problem Relation Age of Onset  . Cancer Sister   . Cancer Sister     [facility] Social History  Substance Use Topics  . Smoking status: Never Smoker  . Smokeless tobacco: Never Used  . Alcohol use No    Review of Systems Review of systems negative except for pertinent positives and negatives in history of present illness above.     Objective:     Vitals:   08/13/16 1100 08/13/16 1106  BP:  110/68  Pulse:  78  Temp:  97.9 F (36.6 C)  TempSrc:  Oral  SpO2:  99%  Weight: 166 lb 6.4 oz (75.5 kg) 166 lb 6.4 oz (75.5 kg)  Height:  5\' 10"  (1.778 m)    Physical Exam GEN: appears well, no apparent distress. Nares: no rhinorrhea, congestion or erythema Oropharynx: mmm without erythema or exudation HEM: negative for cervical or periauricular lymphadenopathies CVS: Irregular, HR 76, nl S1&S2, no murmurs, no edema, no palpable DP & PT pulses bilaterally. Cold lower extremities, left greater than right RESP: speaks in full sentence, no IWOB, CTAB MSK: no focal tenderness or notable swelling SKIN: some old looking small ecchymosis on his arms, clean looking skin laceration about 1 inch long over the dorsal aspect of his left hand, no apparent bleeding  or surrounding skin erythema. See picture for more.    NEURO: alert and  oiented appropriately, cranial nerve exam grossly intact, motor 5/5 in all extremities, light sensation intact in all dermatomes of upper and lower extremities.  PSYCH: euthymic mood with congruent affect    Assessment and Plan:  Cerebrovascular accident (CVA) ([place]) Improved. Left arm weakness resolved. Cranial nerve exam grossly intact. Motor and light sensation intact in his upper and lower extremities. Patient had physical therapy ordered on discharge. Daughter states that the physical therapist will come out in three days.  -Continue Xarelto and aspirin -Continue pravastatin but I don't see the benefit of this. It seems that he didn't tolerate Crestor in the past. Would consider moderate intensity Lipitor but I defer this to the PCP.  -Follow up with PCP in one week (already scheduled)  Chronic atrial fibrillation (HCC) Irregularly irregular heartbeat. Not in RVR. Rate controlled with metoprolol. He is also on Xarelto -Will continue metoprolol and Xarelto  PAD (peripheral artery disease) (HCC) No palpable DP and PT pulses in both extremities. Very cold lower extremities, left greater than right. No notable skin break or lesion.  -Continue aspirin.  -He is also on Xarelto per recommendation by vascular surgery at his recent hospitalization.   Laceration of skin of right hand Clean looking skin laceration about 1 inch long over the dorsal aspect of his left hand, no apparent bleeding or surrounding skin erythema except some ecchymosis. No signs of cellulitis. Daughter doing dressing at home. Received Tdap at his last hospitalization -Recommend dressing with thin layer of gauze every other day -Discussed return precautions including but not limited to bleeding, fever, skin redness, pus or discharge or other symptoms concerning to him  Return in about 1 week (around 08/20/2016) for F/u with PCP.  Mercy Riding, MD 08/13/16 Pager: [phone]

## 2016-08-13 NOTE — Assessment & Plan Note (Addendum)
No palpable DP and PT pulses in both extremities. Very cold lower extremities, left greater than right. No notable skin break or lesion.  -Continue aspirin.  -He is also on Xarelto per recommendation by vascular surgery at his recent hospitalization.

## 2016-08-13 NOTE — Patient Instructions (Addendum)
It was great seeing you today! I am glad you are feeling better. We have updated your medication list today. Continue taking your medications as prescribed. You have an appointment with your primary care doctor next week.  For the wound: continue dressing change with guaze every two to three days. Please, let us know if you have fever, pus, swelling or other symptoms concerning to you.   Take Care,

## 2016-08-13 NOTE — Assessment & Plan Note (Signed)
Irregularly irregular heartbeat. Not in RVR. Rate controlled with metoprolol. He is also on Xarelto -Will continue metoprolol and Xarelto

## 2016-08-13 NOTE — Assessment & Plan Note (Addendum)
Improved. Left arm weakness resolved. Cranial nerve exam grossly intact. Motor and light sensation intact in his upper and lower extremities. Patient had physical therapy ordered on discharge. Daughter states that the physical therapist will come out in three days.  -Continue Xarelto and aspirin -Continue pravastatin but I don't see the benefit of this. It seems that he didn't tolerate Crestor in the past. Would consider moderate intensity Lipitor but I defer this to the PCP.  -Follow up with PCP in one week (already scheduled)

## 2016-08-13 NOTE — Assessment & Plan Note (Addendum)
Clean looking skin laceration about 1 inch long over the dorsal aspect of his left hand, no apparent bleeding or surrounding skin erythema except some ecchymosis. No signs of cellulitis. Daughter doing dressing at home. Received Tdap at his last hospitalization -Recommend dressing with thin layer of gauze every other day -Discussed return precautions including but not limited to bleeding, fever, skin redness, pus or discharge or other symptoms concerning to him

## 2016-08-17 ENCOUNTER — Telehealth: Payer: Self-pay | Admitting: *Deleted

## 2016-08-17 NOTE — Telephone Encounter (Signed)
[name], Physical Therapist with [facility] called to request verbal order for physical therapy. Physical therapy once a week for 2 weeks to establish an exercise program.  Please give him a call at [phone].  Derl Barrow, RN

## 2016-08-17 NOTE — Telephone Encounter (Signed)
This was routed back to me. Please complete.

## 2016-08-17 NOTE — Telephone Encounter (Signed)
Completed. September Mormile Kennon Holter, CMA

## 2016-08-17 NOTE — Progress Notes (Signed)
   Subjective:    Patient ID: [name], male    DOB: [birth year], 83 y.o.   MRN: [id number]  HPI 83 y/o male presents for hospital follow up. Seen for initial hospital follow up by Dr. Cyndia Skeeters on 08/13/16.   Hospital Summary (Reviewed Discharge Summary) Admitted 08/09/16 - 08/11/16. Primary diagnosis was CVA. Presented with left facial droop and left UE/LE weakness and numbness. MRI results below. Neurology followed the patient during his stay. Restarted on Xarelto (previous stopped due to GI bleed). Continue ASA 81 mg. Started on Pravastatin.  He was also evaluated for a left cold foot. ABI was wnl bilaterally. Vascular surgery evaluated the patient and determined that no acute intervention was required.   Afib Prescribed Metoprolol 12.5 mg BID for rate control. Restarted Xarelto at discharge. No bleeding, no palpitations, no chest pain   CVA Still some residual left hand weakness, getting home PT/OT, in general strength is much improved  Left cold foot No further symptoms  Swelling at home No longer taking the Lasix (stopped during hospitalization per daughter), home weights stable in low 160's, daugther reports increased swelling, harder to put pants on.   Social Never a smoker  Review of Systems  Constitutional: Negative for chills and fatigue.  Respiratory: Negative for cough and shortness of breath.   Gastrointestinal: Negative for diarrhea, nausea and vomiting.       Objective:   Physical Exam BP 124/70   Pulse 81   Temp 97.5 F (36.4 C) (Oral)   Ht 5\' 10"  (1.778 m)   Wt 171 lb 12.8 oz (77.9 kg)   SpO2 95%   BMI 24.65 kg/m   Gen: pleasant male HEENT: normocephalic, PERRL, EOMI, no scleral icterus, MMM, uvula midline Cardiac: Irregular Irregular rate and rhythm, S1 and S2 present, no murmur Resp: CTAB, normal effort Neuro: CN 2-12 intact, strength RUE/RLE/LLE 5/5, LUE strength 4+/5 (most weak with grip strength), sensation to light touch intact in all  extremities, gait - hunched over but otherwise normal Ext: Bilateral 1+ edema up to mid skin; 2+ DP pulse in left extremities, 3-4 second capillary refill on left Skin: ecchymosis of bilateral upper extremities  MRI IMPRESSION: 1. Right posterior MCA distribution predominantly cortical diffusion restriction compatible with acute/early subacute infarction. No acute hemorrhage identified. 2. Stable foci of encephalomalacia in bilateral occipital, left lateral temporal, and right inferior frontal lobes. 3. Stable moderate chronic microvascular ischemic changes and parenchymal volume loss of the brain. 4. No large vessel occlusion, aneurysm, or significant stenosis of the circle of Willis is identified.    Echo LV with mild concentric hypertrophy, EF 55-60%, no regional wall motion abnormalities  Carotid Doppler - non-significant stenosis Assessment & Plan:  Cerebrovascular accident (CVA) ([place]) Left sided strength improved.  -continue OT/PT  PAD (peripheral artery disease) (HCC) Palpable pulses in left foot with diminished capillary refill.  -continue ASA and Xaralto  Chronic atrial fibrillation (HCC) Rate controlled. Currently in Afib -continue Metoprolol and Xaralto  CHF (congestive heart failure), NYHA class II, chronic, diastolic (HCC) Patient having increased swelling. -restart lasix 20 mg daily

## 2016-08-17 NOTE — Telephone Encounter (Signed)
RN staff - please provide verbal orders for physical therapy.

## 2016-08-20 ENCOUNTER — Encounter: Payer: Self-pay | Admitting: Family Medicine

## 2016-08-20 ENCOUNTER — Ambulatory Visit (INDEPENDENT_AMBULATORY_CARE_PROVIDER_SITE_OTHER): Payer: Medicare Other | Admitting: Family Medicine

## 2016-08-20 DIAGNOSIS — I639 Cerebral infarction, unspecified: Secondary | ICD-10-CM | POA: Diagnosis not present

## 2016-08-20 DIAGNOSIS — I482 Chronic atrial fibrillation, unspecified: Secondary | ICD-10-CM

## 2016-08-20 DIAGNOSIS — I5032 Chronic diastolic (congestive) heart failure: Secondary | ICD-10-CM

## 2016-08-20 DIAGNOSIS — I739 Peripheral vascular disease, unspecified: Secondary | ICD-10-CM | POA: Diagnosis not present

## 2016-08-20 MED ORDER — FUROSEMIDE 20 MG PO TABS: 20.0000 mg | ORAL_TABLET | Freq: Every day | ORAL | 3 refills | Status: DC

## 2016-08-20 NOTE — Patient Instructions (Addendum)
It was nice to see you today.  Leg swelling - start lasix 20 mg daily (take in the morning).   Stroke - continue home OT  Atrial Fibrillation - continue blood thinner and Metoprolol

## 2016-08-20 NOTE — Assessment & Plan Note (Signed)
Rate controlled. Currently in Afib -continue Metoprolol and Jennye Moccasin

## 2016-08-20 NOTE — Assessment & Plan Note (Signed)
Left sided strength improved.  -continue OT/PT

## 2016-08-20 NOTE — Assessment & Plan Note (Signed)
Palpable pulses in left foot with diminished capillary refill.  -continue ASA and Jennye Moccasin

## 2016-08-20 NOTE — Assessment & Plan Note (Signed)
Patient having increased swelling. -restart lasix 20 mg daily

## 2016-09-06 ENCOUNTER — Other Ambulatory Visit: Payer: Self-pay | Admitting: Cardiovascular Disease

## 2016-09-07 NOTE — Telephone Encounter (Signed)
Age 83 Saw Dr Angelena Form 07/30/2016 Xarelto restarted on discharge from hospital 08/11/2016 with recent CVA Wt 77.9kg 08/20/2016  08/10/2016 SrCr1.06 08/11/2016 Hgb 13.2 HCT 40.9 CrCl  51.03 Refill done Xarelto 20 mg daily

## 2016-09-15 NOTE — Progress Notes (Signed)
   Subjective:    Patient ID: [name], male    DOB: August 10, 1923, 83 y.o.   MRN: [id number]  HPI 83 y/o male presents for routine follow up.  Afib/Diastolic CHF/HTN On Metoprolol 12.5 BID and Xarelto, occasional bleeding on hands/arms if has injury, no blood in stool, no chest pain, no palpitations, sleep on one pillow, no PND  Leg swelling Restarted Lasix 20 mg at last visit, improved from last visit, breathing is good, no shortness of breath with ambulation.   Abnormal Chest CT See below No abestos exposure in the past  HM Due for PNA vaccine  Social Nonsmoker  Review of Systems See Above    Objective:   Physical Exam BP 124/70   Pulse 67   Ht 5\' 10"  (1.778 m)   Wt 171 lb 3.2 oz (77.7 kg)   SpO2 95%   BMI 24.56 kg/m    Gen: pleasant male, NAD Cardiac: Irregular Irregular rhythm, S1 and S2 present, no murmur Resp: CTAB, normal effort Ext: trace edema Skin: bruising present on bilateral arms  CT 07/2016 IMPRESSION: 1. Residual patchy ground-glass opacities throughout both lungs at the resolved areas of consolidation from the 05/16/2016 chest CT, consistent with a resolving infectious or inflammatory process. 2. Bandlike opacity in the anterior right upper lobe is new since 05/16/2016, was probably present on 06/17/2016 chest radiograph, favor evolving postinfectious/postinflammatory scar. 3. Patchy subpleural reticulation in both lungs predominantly in the lower lobes, favoring evolving postinflammatory fibrosis. 4. Small left calcified pleural plaque. Trace dependent left pleural effusion. Cannot exclude asbestos related pleural disease. 5. Stable mild cardiomegaly.  Three-vessel coronary atherosclerosis. 6. Aortic atherosclerosis.      Assessment & Plan:  CHF (congestive heart failure), NYHA class II, chronic, diastolic (HCC) Swelling improved. Weight stable. Stable respiratory symptoms. -continue ASA, Metoprolol, Xarelto, and Lasix 20 mg  daily.    Chronic atrial fibrillation (HCC) Rate controlled. -continue Metoprolol and Xarelto  Abnormal CT of the chest Discussed CT Findings from January 2018. As patient is currently asymptomatic we agreed to hold on referral to Pulmonology. Counseled patient that if he does have worsening respiratory symptoms we would consider referral to Pulmonology.   Health care maintenance On review of Health Maintenance patient is due for 13-Valent PNA. RN staff check [facility] which initially confirmed patient needed vaccine. Vaccine was provided. However RN staff later recheck [facility] and patient had already received this vaccine. At this time no ill effects are expected. Monitor.

## 2016-09-17 ENCOUNTER — Encounter: Payer: Self-pay | Admitting: Family Medicine

## 2016-09-17 ENCOUNTER — Ambulatory Visit (INDEPENDENT_AMBULATORY_CARE_PROVIDER_SITE_OTHER): Payer: Medicare Other | Admitting: Family Medicine

## 2016-09-17 VITALS — BP 124/70 | HR 67 | Ht 70.0 in | Wt 171.2 lb

## 2016-09-17 DIAGNOSIS — Z0000 Encounter for general adult medical examination without abnormal findings: Secondary | ICD-10-CM | POA: Diagnosis not present

## 2016-09-17 DIAGNOSIS — Z7189 Other specified counseling: Secondary | ICD-10-CM | POA: Insufficient documentation

## 2016-09-17 DIAGNOSIS — I5032 Chronic diastolic (congestive) heart failure: Secondary | ICD-10-CM

## 2016-09-17 DIAGNOSIS — J181 Lobar pneumonia, unspecified organism: Secondary | ICD-10-CM

## 2016-09-17 DIAGNOSIS — R938 Abnormal findings on diagnostic imaging of other specified body structures: Secondary | ICD-10-CM

## 2016-09-17 DIAGNOSIS — Z23 Encounter for immunization: Secondary | ICD-10-CM | POA: Diagnosis not present

## 2016-09-17 DIAGNOSIS — R9389 Abnormal findings on diagnostic imaging of other specified body structures: Secondary | ICD-10-CM

## 2016-09-17 DIAGNOSIS — I482 Chronic atrial fibrillation, unspecified: Secondary | ICD-10-CM

## 2016-09-17 DIAGNOSIS — J189 Pneumonia, unspecified organism: Secondary | ICD-10-CM

## 2016-09-17 NOTE — Assessment & Plan Note (Signed)
Swelling improved. Weight stable. Stable respiratory symptoms. -continue ASA, Metoprolol, Xarelto, and Lasix 20 mg daily.

## 2016-09-17 NOTE — Assessment & Plan Note (Signed)
On review of Health Maintenance patient is due for 13-Valent PNA. RN staff check [facility] which initially confirmed patient needed vaccine. Vaccine was provided. However RN staff later recheck [facility] and patient had already received this vaccine. At this time no ill effects are expected. Monitor.

## 2016-09-17 NOTE — Patient Instructions (Signed)
It was nice to see you today.  Leg swelling - continue Lasix 20 mg daily  Lungs - please return if you start to feel more short of breath.   Please return in early June (Dr. Ree Kida leaves at the end of June).

## 2016-09-17 NOTE — Assessment & Plan Note (Signed)
Rate controlled. -continue Metoprolol and Xarelto

## 2016-09-17 NOTE — Assessment & Plan Note (Signed)
Discussed CT Findings from January 2018. As patient is currently asymptomatic we agreed to hold on referral to Pulmonology. Counseled patient that if he does have worsening respiratory symptoms we would consider referral to Pulmonology.

## 2016-10-01 ENCOUNTER — Other Ambulatory Visit: Payer: Self-pay | Admitting: Family Medicine

## 2016-10-30 ENCOUNTER — Telehealth: Payer: Self-pay | Admitting: Internal Medicine

## 2016-10-30 NOTE — Telephone Encounter (Signed)
**  After Hours/ Emergency Line Call*  Received a call to report that [name]  Patient's daughter reports that on Wednesday the dog pushed him into the car and hit the door with his arm. He has a laceration on his arm that is oozing since he on anticoagulation. Has been putting guaze and wrapping but still bleeding some but has improved overall. No fevers or chills, no signs of infection. I advised that it might be beneficial for him to be seen at urgent care. Patient and daughter opted for same day visit on Monday: Dr. Lincoln Brigham at 8:30AM. Advised that if worsens he should go to urgent care.  Smiley Houseman, MD PGY-2, [facility]

## 2016-10-30 NOTE — Telephone Encounter (Signed)
Resident note reviewed. Agree with plan.

## 2016-11-01 ENCOUNTER — Encounter: Payer: Self-pay | Admitting: Student

## 2016-11-01 ENCOUNTER — Ambulatory Visit (INDEPENDENT_AMBULATORY_CARE_PROVIDER_SITE_OTHER): Payer: Medicare Other | Admitting: Student

## 2016-11-01 VITALS — BP 122/78 | HR 72 | Temp 97.8°F | Wt 174.0 lb

## 2016-11-01 DIAGNOSIS — S51811A Laceration without foreign body of right forearm, initial encounter: Secondary | ICD-10-CM | POA: Diagnosis not present

## 2016-11-01 NOTE — Patient Instructions (Addendum)
Follow up if arm injury has not improved by Friday Change dressing once daily Keep it clean and dry Use neosporin ointment when changing your dressing

## 2016-11-01 NOTE — Assessment & Plan Note (Signed)
Laceration of right forearm complicated by use of Xarelto and aspirin with still some oozing. Lacerations appear to be healing although slowly. Will manage conservatively for now with daily dressing changes. If not markedly improved by the end of the week, he will return to follow up.

## 2016-11-01 NOTE — Progress Notes (Signed)
   Subjective:    Patient ID: [name], male    DOB: [birth year], 83 y.o.   MRN: [id number]   CC: right arm laceration  HPI:  83 y/o M on aspirin and elliquis presents for right arm laceration  Right arm laceration - occurred 5 days ago after slipping on gravel while getting out of the car and hitting his arm against the care - he sis not fall to the ground - no other injuries - his arm was initially bleeding profusely but his daughter has been dressing it twice per day, however it continues to bleed - at last dressing change yesterday, his daughter did feel it was much improved - he denies swelling of the arm, pain or fevers  Smoking status reviewed  Review of Systems  Per HPI, else denies, chest pain, shortness of breath    Objective:  BP 122/78   Pulse 72   Temp 97.8 F (36.6 C) (Oral)   Wt 174 lb (78.9 kg)   SpO2 99%   BMI 24.97 kg/m  Vitals and nursing note reviewed  General: NAD Cardiac: RRR Respiratory: CTAB, normal effort Extremities: right arm with significant bruising, with two skin tears, one approximately 1 cm in diameter, smaller approximately 0.5 cm in diameter. Overall laceration appears to be health but each has small areas of oozing blood.   Neuro: alert and oriented, no focal deficits   Assessment & Plan:    Laceration of right forearm Laceration of right forearm complicated by use of Xarelto and aspirin with still some oozing. Lacerations appear to be healing although slowly. Will manage conservatively for now with daily dressing changes. If not markedly improved by the end of the week, he will return to follow up.     Mitsugi Schrader A. Lincoln Brigham MD, [facility] Resident PGY-3 Pager [phone]

## 2016-11-05 ENCOUNTER — Encounter: Payer: Self-pay | Admitting: Cardiovascular Disease

## 2016-11-05 ENCOUNTER — Ambulatory Visit (INDEPENDENT_AMBULATORY_CARE_PROVIDER_SITE_OTHER): Payer: Medicare Other | Admitting: Cardiovascular Disease

## 2016-11-05 VITALS — BP 138/64 | HR 85 | Ht 70.0 in | Wt 172.8 lb

## 2016-11-05 DIAGNOSIS — I481 Persistent atrial fibrillation: Secondary | ICD-10-CM

## 2016-11-05 DIAGNOSIS — E7800 Pure hypercholesterolemia, unspecified: Secondary | ICD-10-CM

## 2016-11-05 DIAGNOSIS — I5042 Chronic combined systolic (congestive) and diastolic (congestive) heart failure: Secondary | ICD-10-CM | POA: Diagnosis not present

## 2016-11-05 DIAGNOSIS — I2510 Atherosclerotic heart disease of native coronary artery without angina pectoris: Secondary | ICD-10-CM

## 2016-11-05 DIAGNOSIS — I4819 Other persistent atrial fibrillation: Secondary | ICD-10-CM

## 2016-11-05 NOTE — Patient Instructions (Signed)
Medication Instructions:  Your physician has recommended you make the following change in your medication:  Stop aspirin   Labwork: none  Testing/Procedures: none  Follow-Up: Your physician recommends that you schedule a follow-up appointment in: 6 months. Please call our office in about 3 months to schedule this appointment.     Any Other Special Instructions Will Be Listed Below (If Applicable).     If you need a refill on your cardiac medications before your next appointment, please call your pharmacy.

## 2016-11-05 NOTE — Progress Notes (Signed)
Chief Complaint  Patient presents with  . Leg Swelling     History of Present Illness: 83 yo male with history of CAD, HLD, atrial fib/flutter who is here today for cardiac follow up. I met him for the first time in 03/07/14. He had been followed by DR. Stuckey before that. Last cath 2009. Abnormal EKG in July 2013 concerning for atrial flutter. He was seen in the EP clinic by Dr. Rayann Heman and event monitor showed sinus brady only with no pauses. He has not tolerated statins in the past. DVT in July 2015 and started on Xarelto. This was stopped in spring 2016.  He was seen in the ED in August and September 2016 for dizziness felt to be due to his muscle relaxer. Head CT and EKG were ok. Seen in ED October 2017 with swollen legs. Venous dopplers negative for DVT. Treated for cellulitis and given Lasix. EKG that day with atrial fib. He was restarted on Xarelto. He was [facility] to [facility] 2017 with pneumonia, hemoptysis and CHF. He was diuresed with IV Lasix. HIs Xarelto was stopped due to hemoptysis and anemia and he then had a stroke in January 2018. He has no residual deficits. Echo January 2018 with normal LVEF=55-60%, moderate MR  He is here today for follow up. The patient denies any chest pain, dyspnea, palpitations, lower extremity edema, orthopnea, PND, dizziness, near syncope or syncope.    Primary Care Physician: Lupita Dawn, MD  Past Medical History:  Diagnosis Date  . Coronary atherosclerosis of native coronary artery    a. Anterior STEMI 2009 s/p BMSx2 to mid & prox LAD and angioplasty to distal LAD. total RCA with collaterals, moderate Cx plaquing. a. EF 55-60% in 2013.  [name] Elevated troponin   . Gross hematuria   . Gross hematuria 11/18/2008   Qualifier: Diagnosis of  By: Owens Shark, RN, BSN, [name]    . Hemoptysis 05/16/2016  . HTN (hypertension)   . Myocardial infarct ([place]) 03/21/2008  . NSVT (nonsustained ventricular tachycardia) ([place])    a. Per DC summary from time of STEMI  2009.  [name] Paroxysmal atrial flutter ([place])    a. Abnl EKG 01/2012 concerning for atrial flutter, event monitor showed sinus bradycardia only and no pauses. Not felt to be a candidate for longterm anticoag due to hematuria.  . Phlebitis and thrombophlebitis of superficial vessels of lower extremities   . Pure hypercholesterolemia    a. Has not tolerated statins in the past.  . Right leg DVT ([place])    a. Dx 01/2014.  [name] Sinus bradycardia    a. By prior event monitor.  . Sinus bradycardia    a. By prior event monitor.   . Thrombocytopenia ([place])   . Urinary retention 06/20/2011    Past Surgical History:  Procedure Laterality Date  . CARPAL TUNNEL RELEASE    . CORONARY STENT PLACEMENT  2009  . REPLACEMENT TOTAL KNEE BILATERAL     Left 2014 (Dr. Eddie Dibbles); right 2012 (Dr. Redmond Pulling)  . trigger finger surgery      Current Outpatient Prescriptions  Medication Sig Dispense Refill  . clotrimazole (LOTRIMIN) 1 % cream Apply 1 application topically 2 (two) times daily as needed (to site). Apply to affected area bid for 7 days. 40 g 2  . fluticasone (FLONASE) 50 MCG/ACT nasal spray Place 2 sprays into both nostrils daily.  11  . furosemide (LASIX) 20 MG tablet Take 1 tablet (20 mg total) by mouth daily. 30 tablet 3  .  metoprolol tartrate (LOPRESSOR) 25 MG tablet TAKE 1/2 TABLET BY MOUTH 2 TIMES DAILY 30 tablet 10  . nitroGLYCERIN (NITROSTAT) 0.4 MG SL tablet Place 0.4 mg under the tongue every 5 (five) minutes as needed for chest pain.    . pravastatin (PRAVACHOL) 20 MG tablet TAKE 1 TABLET (20 MG TOTAL) BY MOUTH DAILY AT 6 PM. 30 tablet 2  . pregabalin (LYRICA) 75 MG capsule Take 1 capsule (75 mg total) by mouth 2 (two) times daily. 60 capsule 0  . Red Yeast Rice Extract (RED YEAST RICE PO) Take 1,200 mg by mouth daily.    [name] 20 MG TABS tablet TAKE 1 TABLET (20 MG TOTAL) BY MOUTH DAILY WITH SUPPER. 30 tablet 5   No current facility-administered medications for this visit.     Allergies    Allergen Reactions  . Prednisone Other (See Comments)    Does not feel right  . Rosuvastatin Other (See Comments)    Aches    Social History   Social History  . Marital status: Married    Spouse name: N/A  . Number of children: N/A  . Years of education: N/A   Occupational History  . Not on file.   Social History Main Topics  . Smoking status: Never Smoker  . Smokeless tobacco: Never Used  . Alcohol use No  . Drug use: No  . Sexual activity: Not on file   Other Topics Concern  . Not on file   Social History Narrative   Lives in [place] w/ wife. 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 Denies tobacco, alcohol, or drug use ever in lifetime.    Family History  Problem Relation Age of Onset  . Cancer Sister   . Cancer Sister     Review of Systems:  As stated in the HPI and otherwise negative.   BP 138/64   Pulse 85   Ht 5' 10"  (1.778 m)   Wt 172 lb 12.8 oz (78.4 kg)   SpO2 94%   BMI 24.79 kg/m   Physical Examination:  General: Well developed, well nourished, NAD  HEENT: OP clear, mucus membranes moist  SKIN: warm, dry. No rashes. Neuro: No focal deficits  Musculoskeletal: Muscle strength 5/5 all ext  Psychiatric: Mood and affect normal  Neck: No JVD, no carotid bruits, no thyromegaly, no lymphadenopathy.  Lungs:Clear bilaterally, no wheezes, rhonci, crackles Cardiovascular: Regular rate and rhythm. No murmurs, gallops or rubs. Abdomen:Soft. Bowel sounds present. Non-tender.  Extremities: No lower extremity edema. Pulses are 2 + in the bilateral DP/PT.  Cardiac cath 03/21/08:  1. Ventriculography in the RAO projection reveals hypo-to-akinesis of  the anteroapical segment. The ejection fraction estimate will be  45%.  2. The left main is free of critical disease.  3. The LAD has a 60% narrowing at the septal. There is some mild  plaquing distal to this and then just after another septal, there  is a 90% narrowing, then total  occlusion after a small diagonal.  The extent of disease extends into just beyond the next diagonal.  The proximal lesion was covered with a 2.5-mm stent with reduction  from 60%-0%. The mid LAD, which was the site of total occlusion  was reduced from 90% and 100% down to 0%. The distal lesion was  80%-90% in the distal vessel and reduced from 80%-90% to less than  20% residual narrowing with smooth angiographic result and good  runoff into the distal vessel.  4. The  circumflex provides a large marginal branch with diffuse  segmental 60% narrowing followed by a large marginal branch. There  was a tiny subbranch with 90% narrowing and an inferior subbranch  with perhaps 60% narrowing.  5. The right coronary artery is segmentally plaqued in the midvessel  and then totally occluded after an RV branch. This RV branch then  collateralizes the distal vessel. The distal vessel was supplied  by the LAD septals.  Echo  January 2018 Left ventricle: The cavity size was normal. There was mild   concentric hypertrophy. Systolic function was normal. The   estimated ejection fraction was in the range of 55% to 60%. Wall   motion was normal; there were no regional wall motion   abnormalities. - Mitral valve: There was mild to moderate regurgitation directed   eccentrically and posteriorly. - Left atrium: The atrium was mildly dilated. - Right atrium: The atrium was moderately to severely dilated. - Atrial septum: No defect or patent foramen ovale was identified. - Tricuspid valve: There was mild-moderate regurgitation directed   centrally. - Pulmonary arteries: Systolic pressure was mildly to moderately   increased. PA peak pressure: 49 mm Hg (S).  EKG:  EKG is not ordered today. The ekg ordered today demonstrates  Recent Labs: 05/15/2016: B Natriuretic Peptide 355.6 08/09/2016: ALT 25 08/10/2016: BUN 22; Creatinine, Ser 1.06; Potassium 4.2; Sodium 140 08/11/2016: Hemoglobin 13.2; Platelets 112       Wt Readings from Last 3 Encounters:  11/05/16 172 lb 12.8 oz (78.4 kg)  11/01/16 174 lb (78.9 kg)  09/17/16 171 lb 3.2 oz (77.7 kg)     Other studies Reviewed: Additional studies/ records that were reviewed today include: . Review of the above records demonstrates:    Assessment and Plan:   1. CAD without angina: He has no chest pain suggestive of angina.  He is on ASA and beta blocker. He does not tolerate statins.  Will stop ASA since he is back on Xarelto.   2. Atrial fibrillation, persistent: He is not aware of any palpitations. He is in sinus today. Continue Toprol for rate control. Continue Xarelto.    3. Hyperlipidemia: He does not tolerate statins.    4. Chronic combined systolic and diastolic CHF: Volume status is ok today. Continue Lasix.    Current medicines are reviewed at length with the patient today.  The patient does not have concerns regarding medicines.  The following changes have been made:  no change  Labs/ tests ordered today include:   No orders of the defined types were placed in this encounter.   Disposition:   FU with me in 6 months  Signed, Lauree Chandler, MD 11/05/2016 3:48 PM    [facility] [address], [place], [place]  [address] Phone: [phone]; Fax: [phone]

## 2016-11-24 NOTE — Progress Notes (Signed)
   Subjective:    Patient ID: [name], male    DOB: [birth year], 83 y.o.   MRN: [id number]  HPI 83 y/o male presents for routine follow up.   Afib/Diastolic CHF/HTN On Metoprolol 12.5 BID, lasix 20 mg daily, and Xarelto 20 mg daily. Aspirin recently stopped by cardiology due to high bleeding risk (reviewed Cardiology note from 11/05/16). No chest pain, does report some dyspnea with exertion, also has lightheadedness with standing up front bent over position (only lasts a few seconds), no LOC  Cut on right leg On lawn mower, 2 weeks ago, no fevers/chills, has been applying neosporin and bandage  Social Nonsmoker  HM Up to date   Review of Systems See above    Objective:   Physical Exam BP 138/74   Pulse 76   Temp 97.8 F (36.6 C) (Oral)   Ht 5\' 10"  (1.778 m)   Wt 177 lb 12.8 oz (80.6 kg)   SpO2 95%   BMI 25.51 kg/m   Gen: pleasant male, NAD Cardiac: Irregular Irregular rhythm, S1 and S2 present, no murmur Resp: CTAB, normal effort Ext: 1+ edema RLE to knee, 2+ edema of LLE to knee, chronic skin changes associated with edema Skin: 3 cm by 0.5 cm linear laceration of medial right leg, no surrounding erythema/warmth, no drainage      Assessment & Plan:  HTN (hypertension) Controlled -continue Metoprolol 12.5 mg BID  Orthostatic hypotension Symptoms consistent with orthostatic hypotension. Chronic and stable.  - consider decreasing Metoprolol if symptoms worsen  Chronic atrial fibrillation (HCC) Rate controlled. -continue Metoprolol and Xarelto  CHF (congestive heart failure), NYHA class II, chronic, diastolic (HCC) Stable.  -continue lasix 20 mg daily  Laceration of right lower extremity 3 cm by 0.5 cm laceration over right medial LE. No evidence of infection. -continue local wound care

## 2016-11-26 ENCOUNTER — Ambulatory Visit (INDEPENDENT_AMBULATORY_CARE_PROVIDER_SITE_OTHER): Payer: Medicare Other | Admitting: Family Medicine

## 2016-11-26 ENCOUNTER — Encounter: Payer: Self-pay | Admitting: Family Medicine

## 2016-11-26 DIAGNOSIS — I5032 Chronic diastolic (congestive) heart failure: Secondary | ICD-10-CM

## 2016-11-26 DIAGNOSIS — I482 Chronic atrial fibrillation, unspecified: Secondary | ICD-10-CM

## 2016-11-26 DIAGNOSIS — I10 Essential (primary) hypertension: Secondary | ICD-10-CM | POA: Diagnosis not present

## 2016-11-26 DIAGNOSIS — S81811A Laceration without foreign body, right lower leg, initial encounter: Secondary | ICD-10-CM | POA: Diagnosis not present

## 2016-11-26 DIAGNOSIS — I951 Orthostatic hypotension: Secondary | ICD-10-CM

## 2016-11-26 HISTORY — DX: Orthostatic hypotension: I95.1

## 2016-11-26 NOTE — Assessment & Plan Note (Signed)
Controlled -continue Metoprolol 12.5 mg BID

## 2016-11-26 NOTE — Assessment & Plan Note (Signed)
3 cm by 0.5 cm laceration over right medial LE. No evidence of infection. -continue local wound care

## 2016-11-26 NOTE — Assessment & Plan Note (Signed)
Stable.  -continue lasix 20 mg daily

## 2016-11-26 NOTE — Assessment & Plan Note (Signed)
Rate controlled. -continue Metoprolol and Xarelto

## 2016-11-26 NOTE — Patient Instructions (Signed)
It was nice too see you today.   Blood pressure is well controlled.   Cut on right leg - keep covered and watch for infection.

## 2016-11-26 NOTE — Assessment & Plan Note (Signed)
Symptoms consistent with orthostatic hypotension. Chronic and stable.  - consider decreasing Metoprolol if symptoms worsen

## 2016-12-15 ENCOUNTER — Other Ambulatory Visit: Payer: Self-pay | Admitting: Family Medicine

## 2016-12-23 ENCOUNTER — Encounter (HOSPITAL_COMMUNITY): Payer: Self-pay | Admitting: *Deleted

## 2016-12-23 ENCOUNTER — Emergency Department (HOSPITAL_COMMUNITY)
Admission: EM | Admit: 2016-12-23 | Discharge: 2016-12-24 | Disposition: A | Discharge status: Home / Self Care | Payer: Medicare Other | Attending: Emergency Medicine | Admitting: Emergency Medicine

## 2016-12-23 DIAGNOSIS — Z79899 Other long term (current) drug therapy: Secondary | ICD-10-CM | POA: Insufficient documentation

## 2016-12-23 DIAGNOSIS — Z5189 Encounter for other specified aftercare: Secondary | ICD-10-CM | POA: Insufficient documentation

## 2016-12-23 DIAGNOSIS — C4362 Malignant melanoma of left upper limb, including shoulder: Secondary | ICD-10-CM | POA: Insufficient documentation

## 2016-12-23 DIAGNOSIS — I5041 Acute combined systolic (congestive) and diastolic (congestive) heart failure: Secondary | ICD-10-CM | POA: Insufficient documentation

## 2016-12-23 DIAGNOSIS — I2510 Atherosclerotic heart disease of native coronary artery without angina pectoris: Secondary | ICD-10-CM | POA: Insufficient documentation

## 2016-12-23 DIAGNOSIS — I110 Hypertensive heart disease with heart failure: Secondary | ICD-10-CM | POA: Insufficient documentation

## 2016-12-23 DIAGNOSIS — I252 Old myocardial infarction: Secondary | ICD-10-CM | POA: Diagnosis not present

## 2016-12-23 DIAGNOSIS — Z7901 Long term (current) use of anticoagulants: Secondary | ICD-10-CM | POA: Diagnosis not present

## 2016-12-23 NOTE — ED Triage Notes (Signed)
The pt had a lesion emoved from his lt arm yesterday  Today while he was mowing his lt arm where the lesion was removed started bleeding and was rather rapid  He is on blood thinners.  He has a pressure bandage on it   Now and the bleeding is controlled  He is on xarelto

## 2016-12-23 NOTE — ED Notes (Signed)
Pt brought back to triage for reassessment, pt bleeding through bandage. Redressed.

## 2016-12-24 NOTE — ED Provider Notes (Signed)
[facility] DEPT Provider Note   CSN: [id number] Arrival date & time: 12/23/16  2112     History   Chief Complaint Chief Complaint  Patient presents with  . Wound Check    HPI PINCHUS WECKWERTH is a 83 y.o. male.  This is a 83 year old male who had a melanoma removed from his left forearm, 2 days ago.  Yesterday he was out on his riding lawnmower, when he noticed that it was bleeding through the dressing.  Denies any trauma to the area.  Pressure was applied at home with only temporary relief of the bleeding.  He presented to the emergency department with bleeding through the dressing. He does take an anticoagulant for atrial fibrillation.      Past Medical History:  Diagnosis Date  . Coronary atherosclerosis of native coronary artery    a. Anterior STEMI 2009 s/p BMSx2 to mid & prox LAD and angioplasty to distal LAD. total RCA with collaterals, moderate Cx plaquing. a. EF 55-60% in 2013.  [name] Elevated troponin   . Gross hematuria   . Gross hematuria 11/18/2008   Qualifier: Diagnosis of  By: Owens Shark, RN, BSN, [name]    . Hemoptysis 05/16/2016  . HTN (hypertension)   . Myocardial infarct ([place]) 03/21/2008  . NSVT (nonsustained ventricular tachycardia) ([place])    a. Per DC summary from time of STEMI 2009.  [name] Paroxysmal atrial flutter ([place])    a. Abnl EKG 01/2012 concerning for atrial flutter, event monitor showed sinus bradycardia only and no pauses. Not felt to be a candidate for longterm anticoag due to hematuria.  . Phlebitis and thrombophlebitis of superficial vessels of lower extremities   . Pure hypercholesterolemia    a. Has not tolerated statins in the past.  . Right leg DVT ([place])    a. Dx 01/2014.  [name] Sinus bradycardia    a. By prior event monitor.  . Sinus bradycardia    a. By prior event monitor.   . Thrombocytopenia ([place])   . Urinary retention 06/20/2011    Patient Active Problem List   Diagnosis Date Noted  . Orthostatic hypotension 11/26/2016  . Laceration  of right lower extremity 11/26/2016  . Laceration of right forearm 11/01/2016  . Health care maintenance 09/17/2016  . PAD (peripheral artery disease) ([place]) 08/13/2016  . Hyperlipidemia   . Cerebrovascular accident (CVA) ([place]) 08/09/2016  . Cold extremity without peripheral vascular disease   . Abnormal CT of the chest 07/21/2016  . Acute on chronic combined systolic and diastolic congestive heart failure ([place]) 06/17/2016  . Tinea pedis 05/27/2016  . NSTEMI (non-ST elevated myocardial infarction) ([place]) 05/16/2016  . Chronic atrial fibrillation ([place]) 05/16/2016  . Chronic ITP (idiopathic thrombocytopenia) ([facility])   . CHF (congestive heart failure), NYHA class II, chronic, diastolic ([place])   . CAP (community acquired pneumonia) 05/15/2016  . Microscopic hematuria 09/28/2014  . Gait instability 09/27/2014  . Coronary atherosclerosis of native coronary artery   . Paroxysmal atrial flutter ([place])   . Pure hypercholesterolemia   . HTN (hypertension)   . Degenerative disk disease 12/20/2010    Past Surgical History:  Procedure Laterality Date  . CARPAL TUNNEL RELEASE    . CORONARY STENT PLACEMENT  2009  . REPLACEMENT TOTAL KNEE BILATERAL     Left 2014 (Dr. Eddie Dibbles); right 2012 (Dr. Redmond Pulling)  . trigger finger surgery         Home Medications    Prior to Admission medications   Medication Sig Start Date End Date Taking?  Authorizing Provider  clotrimazole (LOTRIMIN) 1 % cream Apply 1 application topically 2 (two) times daily as needed (to site). Apply to affected area bid for 7 days. 05/27/16   Lupita Dawn, MD  fluticasone (FLONASE) 50 MCG/ACT nasal spray Place 2 sprays into both nostrils daily. 08/02/16   [provider]  furosemide (LASIX) 20 MG tablet TAKE 1 TABLET (20 MG TOTAL) BY MOUTH DAILY. 12/15/16   Lupita Dawn, MD  metoprolol tartrate (LOPRESSOR) 25 MG tablet TAKE 1/2 TABLET BY MOUTH 2 TIMES DAILY 06/07/16   Burnell Blanks, MD  nitroGLYCERIN (NITROSTAT) 0.4 MG  SL tablet Place 0.4 mg under the tongue every 5 (five) minutes as needed for chest pain.    [provider]  pravastatin (PRAVACHOL) 20 MG tablet TAKE 1 TABLET (20 MG TOTAL) BY MOUTH DAILY AT 6 PM. 10/04/16   Lupita Dawn, MD  pregabalin (LYRICA) 75 MG capsule Take 1 capsule (75 mg total) by mouth 2 (two) times daily. 05/18/16   Asencion Partridge, MD  Red Yeast Rice Extract (RED YEAST RICE PO) Take 1,200 mg by mouth daily.    [provider]  XARELTO 20 MG TABS tablet TAKE 1 TABLET (20 MG TOTAL) BY MOUTH DAILY WITH SUPPER. 09/07/16 10/07/16  Burnell Blanks, MD    Family History Family History  Problem Relation Age of Onset  . Cancer Sister   . Cancer Sister     Social History Social History  Substance Use Topics  . Smoking status: Never Smoker  . Smokeless tobacco: Never Used  . Alcohol use No     Allergies   Prednisone and Rosuvastatin   Review of Systems Review of Systems  Constitutional: Negative for fever.  Respiratory: Negative for shortness of breath.   Cardiovascular: Negative for chest pain.  Skin: Positive for wound.  Neurological: Negative for weakness.  All other systems reviewed and are negative.    Physical Exam Updated Vital Signs BP 120/65 (BP Location: Left Arm)   Pulse 76   Temp 98.2 F (36.8 C) (Oral)   Resp 14   SpO2 97%   Physical Exam  Constitutional: He appears well-developed and well-nourished.  HENT:  Head: Normocephalic.  Eyes: Pupils are equal, round, and reactive to light.  Neck: Normal range of motion.  Cardiovascular: Normal rate.   Musculoskeletal: Normal range of motion. He exhibits no tenderness.  Neurological: He is alert.  Skin: Skin is warm and dry.     Nursing note and vitals reviewed.    ED Treatments / Results  Labs (all labs ordered are listed, but only abnormal results are displayed) Labs Reviewed - No data to display  EKG  EKG Interpretation None       Radiology No results  found.  Procedures Procedures (including critical care time)  Medications Ordered in ED Medications - No data to display   Initial Impression / Assessment and Plan / ED Course  I have reviewed the triage vital signs and the nursing notes.  Pertinent labs & imaging results that were available during my care of the patient were reviewed by me and considered in my medical decision making (see chart for details).    Wrap pressure held distal to the lesion so that I could visualize the wound.  Small central 1 mm area with brisk oozing.  This was cauterized with silver nitrate and Surgicel placed over the area with a pressure dressing. 30 minutes later wound examined.  No bleeding on the dressing.  Patient will be discharged home to follow-up with his dermatologist.    Final Clinical Impressions(s) / ED Diagnoses   Final diagnoses:  Visit for wound check    New Prescriptions New Prescriptions   No medications on file     Junius Creamer, NP 12/24/16 [place], [place], [place] 12/24/16 1423

## 2016-12-24 NOTE — Discharge Instructions (Signed)
Dressing applied.  Tonight in place for the next 2 days, then carefully removed the outer dressing. Please follow-up with your dermatologist by phone today.

## 2016-12-29 ENCOUNTER — Other Ambulatory Visit: Payer: Self-pay | Admitting: Family Medicine

## 2017-02-14 ENCOUNTER — Other Ambulatory Visit: Payer: Self-pay

## 2017-02-15 MED ORDER — RIVAROXABAN 20 MG PO TABS: 20.0000 mg | ORAL_TABLET | Freq: Every day | ORAL | 5 refills | Status: DC

## 2017-02-15 NOTE — Telephone Encounter (Signed)
Pt last saw Dr Angelena Form on 11/05/16, last labs 08/10/16 Creat 1.06, age 83, weight 80.6, CrCl 52.8, based on CrCl pt is on appropriate dosage of Xarelto 20mg  QD.  Will refill rx x 6 months.

## 2017-04-13 ENCOUNTER — Other Ambulatory Visit: Payer: Self-pay | Admitting: Cardiovascular Disease

## 2017-04-13 ENCOUNTER — Other Ambulatory Visit: Payer: Self-pay | Admitting: *Deleted

## 2017-04-13 MED ORDER — FUROSEMIDE 20 MG PO TABS: 20.0000 mg | ORAL_TABLET | Freq: Every day | ORAL | 0 refills | Status: DC

## 2017-04-14 NOTE — Telephone Encounter (Signed)
Rx refill sent to pharmacy. 

## 2017-04-25 ENCOUNTER — Ambulatory Visit (INDEPENDENT_AMBULATORY_CARE_PROVIDER_SITE_OTHER): Payer: Medicare Other | Admitting: Family Medicine

## 2017-04-25 ENCOUNTER — Encounter: Payer: Self-pay | Admitting: Family Medicine

## 2017-04-25 VITALS — BP 124/70 | HR 81 | Temp 98.1°F | Ht 71.0 in | Wt 177.0 lb

## 2017-04-25 DIAGNOSIS — R059 Cough, unspecified: Secondary | ICD-10-CM | POA: Insufficient documentation

## 2017-04-25 DIAGNOSIS — R05 Cough: Secondary | ICD-10-CM

## 2017-04-25 MED ORDER — BENZONATATE 200 MG PO CAPS: 200.0000 mg | ORAL_CAPSULE | Freq: Two times a day (BID) | ORAL | 0 refills | Status: DC | PRN

## 2017-04-25 NOTE — Assessment & Plan Note (Signed)
  Acute cough in a well appearing 83 year old, no concerning findings on lung exam. Stable vital signs, patient afebrile and feels well otherwise. Likely viral bronchitis vs allergy induced/post-nasal drip cough. Will hold off on antibiotics for now but asked patient to call if health status changes, would given rx for antibiotics over phone unless patient was unstable in some way that required ED visit.  -rx given for tessalon -continue flonase -advised patient to call with any change including fever, chills, change in sputum/hemoptysis, feeling unwell- would start antibiotics at that time likely a course of azithromycin -Patient verbalized understanding and agreement with plan, will call if he feels like things are getting worse

## 2017-04-25 NOTE — Patient Instructions (Signed)
   It was nice to meet you today! I think you have a cough caused by a virus or possibly your allergies acting up. I sent in a cough medication you can take as needed. Continue using your flonase!  I don't think you need antibiotics at this time, but if you develop any signs of fever, chills, feeling unwell, or coughing up blood, please call us at [phone].   I hope you feel better!  Lucila Maine, DO PGY-2, [facility] 04/25/2017 1:46 PM

## 2017-04-25 NOTE — Progress Notes (Signed)
    Subjective:    Patient ID: [name], male    DOB: 1924-04-04, 83 y.o.   MRN: [id number]   CC: cough  Patient developed cough on Friday/Saturday that is persistent. He is bringing up some brown tinged sputum. He denies fevers or chills. He generally feels pretty well. He has allergies and post-nasal drip. He uses flonase for this. He denies sick contacts. No runny nose, no sore throat. He is concerned because last year around this time he got sick and was coughing up blood- he was found to have pneumonia and his blood thinner was stopped due to the hemoptysis. He does not feel remotely that bad. He would just like to get ahead of any illness that may be coming.  Smoking status reviewed- never smoker  Review of Systems- see HPI   Objective:  BP 124/70   Pulse 81   Temp 98.1 F (36.7 C) (Oral)   Ht 5\' 11"  (1.803 m)   Wt 177 lb (80.3 kg)   SpO2 97%   BMI 24.69 kg/m  Vitals and nursing note reviewed  General: pleasant elderly gentleman, well nourished, in no acute distress HEENT: normocephalic, no scleral icterus or conjunctival pallor, no nasal discharge, moist mucous membranes, no erythema or discharge noted in posterior oropharynx Neck: supple, non-tender, without lymphadenopathy Cardiac: irregularly irregular rhythm with clear S1 and S2, no murmurs, rubs, or gallops Respiratory: clear to auscultation bilaterally, no increased work of breathing Extremities: no edema or cyanosis. Skin: warm and dry, no rashes noted Neuro: alert and oriented, no focal deficits  Assessment & Plan:    Cough  Acute cough in a well appearing 83 year old, no concerning findings on lung exam. Stable vital signs, patient afebrile and feels well otherwise. Likely viral bronchitis vs allergy induced/post-nasal drip cough. Will hold off on antibiotics for now but asked patient to call if health status changes, would given rx for antibiotics over phone unless patient was unstable in some way  that required ED visit.  -rx given for tessalon -continue flonase -advised patient to call with any change including fever, chills, change in sputum/hemoptysis, feeling unwell- would start antibiotics at that time likely a course of azithromycin -Patient verbalized understanding and agreement with plan, will call if he feels like things are getting worse   Return if symptoms worsen or fail to improve.   Lucila Maine, DO Family Medicine Resident PGY-2

## 2017-05-05 ENCOUNTER — Ambulatory Visit (INDEPENDENT_AMBULATORY_CARE_PROVIDER_SITE_OTHER): Payer: Medicare Other | Admitting: Cardiovascular Disease

## 2017-05-05 ENCOUNTER — Encounter: Payer: Self-pay | Admitting: Cardiovascular Disease

## 2017-05-05 VITALS — BP 126/70 | HR 77 | Ht 71.0 in | Wt 177.6 lb

## 2017-05-05 DIAGNOSIS — I481 Persistent atrial fibrillation: Secondary | ICD-10-CM

## 2017-05-05 DIAGNOSIS — E7800 Pure hypercholesterolemia, unspecified: Secondary | ICD-10-CM | POA: Diagnosis not present

## 2017-05-05 DIAGNOSIS — I5042 Chronic combined systolic (congestive) and diastolic (congestive) heart failure: Secondary | ICD-10-CM | POA: Diagnosis not present

## 2017-05-05 DIAGNOSIS — I2510 Atherosclerotic heart disease of native coronary artery without angina pectoris: Secondary | ICD-10-CM

## 2017-05-05 DIAGNOSIS — I4819 Other persistent atrial fibrillation: Secondary | ICD-10-CM

## 2017-05-05 NOTE — Patient Instructions (Signed)

## 2017-05-05 NOTE — Progress Notes (Signed)
Chief Complaint  Patient presents with  . Follow-up    CAD     History of Present Illness: 83 yo male with history of CAD, HLD, atrial fib/flutter, DVT, prior CVA who is here today for cardiac follow up. He is known to have CAD. Last cardiac cath in 2009. He is known to have atrial fib/flutter as well as prior DVT. He has been on Xarelto. He has not tolerated statins in the past. DVT in July 2015 and at that time was started on Xarelto. This was stopped in spring 2016.  He was seen in the ED in August and September 2016 for dizziness felt to be due to his muscle relaxer. Head CT and EKG were ok. Seen in ED October 2017 with swollen legs. Venous dopplers negative for DVT. Treated for cellulitis and given Lasix. EKG that day with atrial fib. He was restarted on Xarelto. He was admitted to [facility] November 2017 with pneumonia, hemoptysis and CHF. He was diuresed with IV Lasix. HIs Xarelto was stopped due to hemoptysis and anemia and he then had a stroke in January 2018. He has no residual deficits. Echo January 2018 with normal LVEF=55-60%, moderate MR  He is here today for follow up. The patient denies any chest pain, dyspnea, palpitations, lower extremity edema, orthopnea, PND, dizziness, near syncope or syncope.    Primary Care Physician: Leeanne Rio, MD  Past Medical History:  Diagnosis Date  . Coronary atherosclerosis of native coronary artery    a. Anterior STEMI 2009 s/p BMSx2 to mid & prox LAD and angioplasty to distal LAD. total RCA with collaterals, moderate Cx plaquing. a. EF 55-60% in 2013.  [name] Elevated troponin   . Gross hematuria   . Gross hematuria 11/18/2008   Qualifier: Diagnosis of  By: Owens Shark, RN, BSN, [name]    . Hemoptysis 05/16/2016  . HTN (hypertension)   . Myocardial infarct ([place]) 03/21/2008  . NSVT (nonsustained ventricular tachycardia) ([place])    a. Per DC summary from time of STEMI 2009.  [name] Paroxysmal atrial flutter ([place])    a. Abnl EKG 01/2012 concerning for  atrial flutter, event monitor showed sinus bradycardia only and no pauses. Not felt to be a candidate for longterm anticoag due to hematuria.  . Phlebitis and thrombophlebitis of superficial vessels of lower extremities   . Pure hypercholesterolemia    a. Has not tolerated statins in the past.  . Right leg DVT ([place])    a. Dx 01/2014.  [name] Sinus bradycardia    a. By prior event monitor.  . Sinus bradycardia    a. By prior event monitor.   . Thrombocytopenia ([place])   . Urinary retention 06/20/2011    Past Surgical History:  Procedure Laterality Date  . CARPAL TUNNEL RELEASE    . CORONARY STENT PLACEMENT  2009  . REPLACEMENT TOTAL KNEE BILATERAL     Left 2014 (Dr. Eddie Dibbles); right 2012 (Dr. Redmond Pulling)  . trigger finger surgery      Current Outpatient Prescriptions  Medication Sig Dispense Refill  . benzonatate (TESSALON) 200 MG capsule Take 1 capsule (200 mg total) by mouth 2 (two) times daily as needed for cough. 20 capsule 0  . clotrimazole (LOTRIMIN) 1 % cream Apply 1 application topically 2 (two) times daily as needed (to site). Apply to affected area bid for 7 days. 40 g 2  . fluticasone (FLONASE) 50 MCG/ACT nasal spray Place 2 sprays into both nostrils daily.  11  . furosemide (LASIX) 20  MG tablet Take 1 tablet (20 mg total) by mouth daily. 30 tablet 0  . metoprolol tartrate (LOPRESSOR) 25 MG tablet TAKE 1/2 TABLET BY MOUTH 2 TIMES DAILY 30 tablet 0  . nitroGLYCERIN (NITROSTAT) 0.4 MG SL tablet Place 0.4 mg under the tongue every 5 (five) minutes as needed for chest pain.    . pravastatin (PRAVACHOL) 20 MG tablet TAKE 1 TABLET (20 MG TOTAL) BY MOUTH DAILY AT 6 PM. 90 tablet 1  . pregabalin (LYRICA) 75 MG capsule Take 1 capsule (75 mg total) by mouth 2 (two) times daily. 60 capsule 0  . Red Yeast Rice Extract (RED YEAST RICE PO) Take 1,200 mg by mouth daily.    . rivaroxaban (XARELTO) 20 MG TABS tablet Take 1 tablet (20 mg total) by mouth daily with supper. 30 tablet 5   No current  facility-administered medications for this visit.     Allergies  Allergen Reactions  . Prednisone Other (See Comments)    Does not feel right  . Rosuvastatin Other (See Comments)    Aches    Social History   Social History  . Marital status: Married    Spouse name: N/A  . Number of children: N/A  . Years of education: N/A   Occupational History  . Not on file.   Social History Main Topics  . Smoking status: Never Smoker  . Smokeless tobacco: Never Used  . Alcohol use No  . Drug use: No  . Sexual activity: Not on file   Other Topics Concern  . Not on file   Social History Narrative   Lives in [place] w/ wife. 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 Denies tobacco, alcohol, or drug use ever in lifetime.    Family History  Problem Relation Age of Onset  . Cancer Sister   . Cancer Sister     Review of Systems:  As stated in the HPI and otherwise negative.   BP 126/70   Pulse 77   Ht 5\' 11"  (1.803 m)   Wt 177 lb 9.6 oz (80.6 kg)   SpO2 97%   BMI 24.77 kg/m   Physical Examination:  General: Well developed, well nourished, NAD  HEENT: OP clear, mucus membranes moist  SKIN: warm, dry. No rashes. Neuro: No focal deficits  Musculoskeletal: Muscle strength 5/5 all ext  Psychiatric: Mood and affect normal  Neck: No JVD, no carotid bruits, no thyromegaly, no lymphadenopathy.  Lungs:Clear bilaterally, no wheezes, rhonci, crackles Cardiovascular: Irreg irreg Systolic murmur.  Abdomen:Soft. Bowel sounds present. Non-tender.  Extremities: No lower extremity edema. Pulses are 2 + in the bilateral DP/PT.  Cardiac cath 03/21/08:  1. Ventriculography in the RAO projection reveals hypo-to-akinesis of  the anteroapical segment. The ejection fraction estimate will be  45%.  2. The left main is free of critical disease.  3. The LAD has a 60% narrowing at the septal. There is some mild  plaquing distal to this and then just after another septal,  there  is a 90% narrowing, then total occlusion after a small diagonal.  The extent of disease extends into just beyond the next diagonal.  The proximal lesion was covered with a 2.5-mm stent with reduction  from 60%-0%. The mid LAD, which was the site of total occlusion  was reduced from 90% and 100% down to 0%. The distal lesion was  80%-90% in the distal vessel and reduced from 80%-90% to less than  20% residual narrowing with smooth angiographic  result and good  runoff into the distal vessel.  4. The circumflex provides a large marginal branch with diffuse  segmental 60% narrowing followed by a large marginal branch. There  was a tiny subbranch with 90% narrowing and an inferior subbranch  with perhaps 60% narrowing.  5. The right coronary artery is segmentally plaqued in the midvessel  and then totally occluded after an RV branch. This RV branch then  collateralizes the distal vessel. The distal vessel was supplied  by the LAD septals.  Echo  January 2018 Left ventricle: The cavity size was normal. There was mild   concentric hypertrophy. Systolic function was normal. The   estimated ejection fraction was in the range of 55% to 60%. Wall   motion was normal; there were no regional wall motion   abnormalities. - Mitral valve: There was mild to moderate regurgitation directed   eccentrically and posteriorly. - Left atrium: The atrium was mildly dilated. - Right atrium: The atrium was moderately to severely dilated. - Atrial septum: No defect or patent foramen ovale was identified. - Tricuspid valve: There was mild-moderate regurgitation directed   centrally. - Pulmonary arteries: Systolic pressure was mildly to moderately   increased. PA peak pressure: 49 mm Hg   EKG:  EKG is ordered today. The ekg ordered today demonstrates Atrial fib, rate 90 bpm. Incomplete RBBB. Poor R wave progression precordial leads.   Recent Labs: 05/15/2016: B Natriuretic Peptide 355.6 08/09/2016: ALT  25 08/10/2016: BUN 22; Creatinine, Ser 1.06; Potassium 4.2; Sodium 140 08/11/2016: Hemoglobin 13.2; Platelets 112     Wt Readings from Last 3 Encounters:  05/05/17 177 lb 9.6 oz (80.6 kg)  04/25/17 177 lb (80.3 kg)  11/26/16 177 lb 12.8 oz (80.6 kg)     Other studies Reviewed: Additional studies/ records that were reviewed today include: . Review of the above records demonstrates:    Assessment and Plan:   1. CAD without angina: No chest pain suggestive of angina. Will continue beta blocker. He is not on an ASA since he is also on Xarelto. He does not tolerate statins.    2. Atrial fibrillation, persistent: Atrial fib today. Rate controlled. Continue Toprol and Xarelto.     3. Hyperlipidemia: He does not tolerate statins.    4. Chronic combined systolic and diastolic CHF: Volume status is ok. Continue lasix  Current medicines are reviewed at length with the patient today.  The patient does not have concerns regarding medicines.  The following changes have been made:  no change  Labs/ tests ordered today include:   No orders of the defined types were placed in this encounter.   Disposition:   FU with me in 12 months  Signed, Lauree Chandler, MD 05/05/2017 4:35 PM    [facility] [place], [place], [place]  [address] Phone: [phone]; Fax: [phone]

## 2017-05-16 ENCOUNTER — Other Ambulatory Visit: Payer: Self-pay | Admitting: Cardiovascular Disease

## 2017-05-16 ENCOUNTER — Other Ambulatory Visit: Payer: Self-pay | Admitting: Family Medicine

## 2017-06-28 ENCOUNTER — Other Ambulatory Visit: Payer: Self-pay | Admitting: Family Medicine

## 2017-06-28 MED ORDER — PRAVASTATIN SODIUM 20 MG PO TABS: 20.0000 mg | ORAL_TABLET | Freq: Every day | ORAL | 1 refills | Status: DC

## 2017-07-19 ENCOUNTER — Telehealth: Payer: Self-pay | Admitting: Pharmacist

## 2017-07-19 NOTE — Telephone Encounter (Signed)
LM for [name] to call back - will try again after lunch to be sure they receive clearance.   Spoke with daughter and made aware of Dr. Camillia Herter recommendations. She states appreciation for call back and quick response.

## 2017-07-19 NOTE — Telephone Encounter (Signed)
[name]. He is on Xarelto for atrial fib/flutter. I see no reason why he could not stop the Xarelto for two days leading up to the injection. Can you let them know that it is ok to hold the Xarelto today and tomorrow? Thanks, chris

## 2017-07-19 NOTE — Telephone Encounter (Signed)
F/u Message  Pt daughter call to f/u on clearance for pt to see if the med hold was approved

## 2017-07-19 NOTE — Telephone Encounter (Signed)
Spoke with [name] and advised of Dr. Camillia Herter recommendations. She states appreciation for quick response.

## 2017-07-19 NOTE — Telephone Encounter (Signed)
Alyssa from Dr. Jodi Mourning office ([facility]) called requesting urgent clearance for pt to have spinal injection on Thursday 07/21/17. They state that the daughter is here from out of town and can only bring him on Thursday.   Patient in on Xarelto for Afib with a history of DVT and CVA. With spinal injections we generally recommend holding 72 hours, but with history of CVA would recommend holding for 24 hours and resume ASAP after procedure. Will need to confirm with Dr. Angelena Form for final recommendation on DOAC hold.   Alyssa states to call [phone] once clearance obtained and if no answer call main line [phone].   Advised we would call as soon as recs received from Dr. Angelena Form.

## 2017-08-05 ENCOUNTER — Other Ambulatory Visit: Payer: Self-pay

## 2017-08-05 ENCOUNTER — Ambulatory Visit: Payer: Medicare Other | Admitting: Family Medicine

## 2017-08-05 VITALS — BP 128/72 | HR 74 | Temp 97.6°F | Ht 67.0 in | Wt 180.2 lb

## 2017-08-05 DIAGNOSIS — D693 Immune thrombocytopenic purpura: Secondary | ICD-10-CM | POA: Diagnosis not present

## 2017-08-05 DIAGNOSIS — I482 Chronic atrial fibrillation, unspecified: Secondary | ICD-10-CM

## 2017-08-05 DIAGNOSIS — I5032 Chronic diastolic (congestive) heart failure: Secondary | ICD-10-CM | POA: Diagnosis not present

## 2017-08-05 DIAGNOSIS — E785 Hyperlipidemia, unspecified: Secondary | ICD-10-CM | POA: Diagnosis not present

## 2017-08-05 DIAGNOSIS — I10 Essential (primary) hypertension: Secondary | ICD-10-CM

## 2017-08-05 NOTE — Patient Instructions (Signed)
It was great to see you again today!  Continue current medications.  On your way out, schedule an appointment one morning to come back for fasting labs. Do not eat or drink anything other than water the morning of your lab appointment until after your labs are drawn.   Follow up with me in 3-4 months, sooner if needed.  Be well, Dr. Ardelia Mems

## 2017-08-05 NOTE — Assessment & Plan Note (Signed)
No bleeding. Check CBC at lab visit, patient instructed to schedule.

## 2017-08-05 NOTE — Assessment & Plan Note (Signed)
Rate controlled on metoprolol. Continue xarelto. Check CBC at future lab visit.

## 2017-08-05 NOTE — Assessment & Plan Note (Signed)
Well controlled. Continue current regimen. Continue to transition between positions slowly to minimize orthostatic symptoms.

## 2017-08-05 NOTE — Progress Notes (Signed)
Date of Visit: 08/05/2017   HPI:  Patient presents for routine follow up and to meet new PCP.  Spot on heel - wants me to check this out. Has chronically cracked skin on feet and about a month ago had a spot that "busted". It has since healed well. He is good about wearing compression hose and keeping his feet elevated.  CHF - taking lasix 20mg  daily. Overall does well on this. Gets mildly shortness of breath if walking long distances (like across [employer]) but in general is able to do what he wants to do without difficulty. No chest pain. Has occasional lightheadedness with position changes, managed by changing position slowly.  Atrial fibrillation - taking metoprolol 12.5mg  twice daily (takes half of a 25mg  tab). Also on xarelto 20mg  daily. No bleeding issues. No palpitations.  ITP - has chronic ITP listed on problem list. Due for check of platelets.   Hyperlipidemia - taking pravastatin 20mg  daily. Tolerating well. No recent check of lipids. Already ate breakfast today.  ROS: See HPI.  [place]: history of hypertension, chronic afib, diastolic CHF, orthostatic hypotension, PAD, prior CVA, hyperlipidemia, prior NSTEMI, chronic ITP, CAD  PHYSICAL EXAM: BP 128/72   Pulse 74   Temp 97.6 F (36.4 C) (Oral)   Ht 5\' 7"  (1.702 m)   Wt 180 lb 3.2 oz (81.7 kg)   SpO2 97%   BMI 28.22 kg/m  Gen: no acute distress, pleasant, cooperative, well appearing HEENT: normocephalic, atraumatic Heart: regular rate and rhythm, no murmur Lungs: clear to auscultation bilaterally, normal work of breathing  Neuro: alert, grossly nonfocal, speech normal Ext: mild edema bilateral lower extremities. R medial heel with cracked/flaking skin but no area of warmth, drainage, fluctuance, or tenderness. Well healing prior blister.  ASSESSMENT/PLAN:  Health maintenance:  -current on HM items  HTN (hypertension) Well controlled. Continue current regimen. Continue to transition between positions slowly to  minimize orthostatic symptoms.   Chronic atrial fibrillation (HCC) Rate controlled on metoprolol. Continue xarelto. Check CBC at future lab visit.  CHF (congestive heart failure), NYHA class II, chronic, diastolic (HCC) Stable on lasix. Continue present dose.   Hyperlipidemia Check lipids at future lab visit since he is not fasting today. In the meantime continue pravastatin.  Chronic ITP (idiopathic thrombocytopenia) (HCC) No bleeding. Check CBC at lab visit, patient instructed to schedule.   Spot on heel - no signs of infection. Encouraged lotion to hydrate skin and combat dry skin.  FOLLOW UP: Follow up in 3-4 mos for routine medical problems Schedule fasting lab visit.   Brent. Ardelia Mems, [place]

## 2017-08-05 NOTE — Assessment & Plan Note (Signed)
Stable on lasix. Continue present dose.

## 2017-08-05 NOTE — Assessment & Plan Note (Signed)
Check lipids at future lab visit since he is not fasting today. In the meantime continue pravastatin.

## 2017-08-09 ENCOUNTER — Other Ambulatory Visit: Payer: Medicare Other

## 2017-08-09 DIAGNOSIS — D693 Immune thrombocytopenic purpura: Secondary | ICD-10-CM

## 2017-08-09 DIAGNOSIS — E785 Hyperlipidemia, unspecified: Secondary | ICD-10-CM

## 2017-08-10 LAB — CMP14+EGFR
ALT: 17 IU/L (ref 0–44)
AST: 18 IU/L (ref 0–40)
Albumin/Globulin Ratio: 1.6 (ref 1.2–2.2)
Albumin: 4.1 g/dL (ref 3.2–4.6)
Alkaline Phosphatase: 90 IU/L (ref 39–117)
BUN/Creatinine Ratio: 19 (ref 10–24)
BUN: 25 mg/dL (ref 10–36)
Bilirubin Total: 1.1 mg/dL (ref 0.0–1.2)
CALCIUM: 9.5 mg/dL (ref 8.6–10.2)
CO2: 24 mmol/L (ref 20–29)
CREATININE: 1.31 mg/dL — AB (ref 0.76–1.27)
Chloride: 104 mmol/L (ref 96–106)
GFR calc Af Amer: 55 mL/min/{1.73_m2} — ABNORMAL LOW (ref >59)
GFR, EST NON AFRICAN AMERICAN: 47 mL/min/{1.73_m2} — AB (ref >59)
Globulin, Total: 2.5 g/dL (ref 1.5–4.5)
Glucose: 112 mg/dL — ABNORMAL HIGH (ref 65–99)
Potassium: 4.5 mmol/L (ref 3.5–5.2)
Sodium: 144 mmol/L (ref 134–144)
Total Protein: 6.6 g/dL (ref 6.0–8.5)

## 2017-08-10 LAB — LIPID PANEL
CHOL/HDL RATIO: 2.9 ratio (ref 0.0–5.0)
Cholesterol, Total: 117 mg/dL (ref 100–199)
HDL: 41 mg/dL (ref >39)
LDL CALC: 65 mg/dL (ref 0–99)
Triglycerides: 55 mg/dL (ref 0–149)
VLDL Cholesterol Cal: 11 mg/dL (ref 5–40)

## 2017-08-10 LAB — CBC
HEMATOCRIT: 39.6 % (ref 37.5–51.0)
HEMOGLOBIN: 13.3 g/dL (ref 13.0–17.7)
MCH: 28.1 pg (ref 26.6–33.0)
MCHC: 33.6 g/dL (ref 31.5–35.7)
MCV: 84 fL (ref 79–97)
Platelets: 97 10*3/uL — CL (ref 150–379)
RBC: 4.73 x10E6/uL (ref 4.14–5.80)
RDW: 14.8 % (ref 12.3–15.4)
WBC: 5.4 10*3/uL (ref 3.4–10.8)

## 2017-08-23 ENCOUNTER — Other Ambulatory Visit: Payer: Self-pay | Admitting: Cardiovascular Disease

## 2017-08-23 NOTE — Telephone Encounter (Addendum)
Xarelto 20mg  refill request received; pt is 84 yrs old, Wt-81.7kg, Crea-1.31 on 08/09/17, last seen by Dr. Angelena Form on 05/05/17, [place].47ml/min. Will send a msg to [facility] as CrCl is less than 50 & should be on Xarelto 15mg . Received msg from Dr. Angelena Form & will change dose to Xarelto 15mg .  Spoke with pt & instructed the pt that his Xarelto dose would be changed to Xarelto 15mg  daily & after explaining to him not to use the 20mg  anymore he verbalized understanding. Pt then wanted to be transferred to make another appt with Dr. Angelena Form because he states he left a message earlier to call him. Transferred pt to Scheduler at this time who was available to assist.

## 2017-08-24 ENCOUNTER — Telehealth: Payer: Self-pay | Admitting: Cardiovascular Disease

## 2017-08-24 MED ORDER — RIVAROXABAN 15 MG PO TABS: 15.0000 mg | ORAL_TABLET | Freq: Every day | ORAL | 6 refills | Status: DC

## 2017-08-24 NOTE — Telephone Encounter (Signed)
Received call transferred directly from operator.  I spoke with pt's daughter and told her I did not see her name on DPR.  Pt's son Nicki Reaper is on [place].  Daughter will have [place] contact office.

## 2017-08-24 NOTE — Telephone Encounter (Signed)
Son called back and requests appointment for tomorrow.  I scheduled pt to see Cecilie Kicks, NP tomorrow (2/14) at 11:30

## 2017-08-24 NOTE — Telephone Encounter (Signed)
I spoke with pt. He reports shortness of breath for last 2 weeks.  No chest pain. No fever. Shortness of breath occurs with activity such as going up stairs. Resolves with rest. He has swelling in left leg but he reports he always has this and it has not increased.  He is not able to come in for an appointment this week.  I scheduled him to see Melina Copa, PA on 08/29/17 at 11:00

## 2017-08-24 NOTE — Telephone Encounter (Signed)
New Message   Pt c/o Shortness Of Breath: STAT if SOB developed within the last 24 hours or pt is noticeably SOB on the phone  1. Are you currently SOB (can you hear that pt is SOB on the phone)? No SOB now, but his voice sounds rattled   2. How long have you been experiencing SOB? About 2 weeks   3. Are you SOB when sitting or when up moving around? Notices it more when he is moving around and going up the steps  4. Are you currently experiencing any other symptoms? No other symptoms. No pain

## 2017-08-24 NOTE — Progress Notes (Signed)
Cardiology Office Note:    Date:  08/25/2017   ID:  [name], DOB October 14, 1923, MRN [id number]  PCP:  Alan Rio, MD  Cardiologist:  Alan Chandler, MD  Referring MD: Alan Rio, MD   Chief Complaint  Patient presents with  . Shortness of Breath    History of Present Illness:    [name] is a 84 y.o. male with a past medical history significant for CAD, HLD, atrial fib/flutter on Xarelto, prior CVA 07/2016, CHF. He has known CAD s/p STEMI 2009 with BMS X2 to mid & prox LAD, total RCA with collaterals, mod Circ plaque.  He is known to have atrial fib/flutter as well as prior DVT. He has been on Xarelto. He has not tolerated statins in the past. DVT in July 2015 and at that time was started on Xarelto. This was stopped in spring 2016.  He was seen in the ED in August and September 2016 for dizziness felt to be due to his muscle relaxer. Head CT and EKG were ok. Seen in ED October 2017 with swollen legs. Venous dopplers negative for DVT. Treated for cellulitis and given Lasix. EKG that day with atrial fib. He was restarted on Xarelto. He was admitted to [facility] November 2017 with pneumonia, hemoptysis and CHF. He was diuresed with IV Lasix. HIs Xarelto was stopped due to hemoptysis and anemia and he then had a stroke in January 2018. He has no residual deficits. Echo January 2018 with normal LVEF=55-60%, moderate MR.  He is here to day for evaluation of shortness of breath with his daughter, [name].  [name] has developed new increased dyspnea on exertion like walking up hill and leg weakness causing his to need to sit down. Progressive over the last 2 weeks. No chest pain/pressure or tightness. He had an epidural in his back 07/21/17 that improved his prior leg pain.   Has 1+ pitting edema in the left lower leg, none in right. Wearing compression stockings. Edema is not worse than has been for several years. Pt denies orthopnea or PND.    Past  Medical History:  Diagnosis Date  . Coronary atherosclerosis of native coronary artery    a. Anterior STEMI 2009 s/p BMSx2 to mid & prox LAD and angioplasty to distal LAD. total RCA with collaterals, moderate Cx plaquing. a. EF 55-60% in 2013.  [name] Elevated troponin   . Gross hematuria   . Gross hematuria 11/18/2008   Qualifier: Diagnosis of  By: Owens Shark, RN, BSN, [name]    . Hemoptysis 05/16/2016  . HTN (hypertension)   . Myocardial infarct ([place]) 03/21/2008  . NSVT (nonsustained ventricular tachycardia) ([place])    a. Per DC summary from time of STEMI 2009.  [name] Paroxysmal atrial flutter ([place])    a. Abnl EKG 01/2012 concerning for atrial flutter, event monitor showed sinus bradycardia only and no pauses. Not felt to be a candidate for longterm anticoag due to hematuria.  . Phlebitis and thrombophlebitis of superficial vessels of lower extremities   . Pure hypercholesterolemia    a. Has not tolerated statins in the past.  . Right leg DVT ([place])    a. Dx 01/2014.  [name] Sinus bradycardia    a. By prior event monitor.  . Sinus bradycardia    a. By prior event monitor.   . Thrombocytopenia ([place])   . Urinary retention 06/20/2011    Past Surgical History:  Procedure Laterality Date  . CARPAL TUNNEL RELEASE    .  CORONARY STENT PLACEMENT  2009  . REPLACEMENT TOTAL KNEE BILATERAL     Left 2014 ([name]); right 2012 ([name])  . trigger finger surgery      Current Medications: Current Meds  Medication Sig  . fluticasone (FLONASE) 50 MCG/ACT nasal spray Place 2 sprays into both nostrils daily as needed for allergies or rhinitis.  . metoprolol tartrate (LOPRESSOR) 25 MG tablet TAKE 1/2 TABLET BY MOUTH 2 TIMES DAILY  . nitroGLYCERIN (NITROSTAT) 0.4 MG SL tablet Place 0.4 mg under the tongue every 5 (five) minutes as needed for chest pain.  . pravastatin (PRAVACHOL) 20 MG tablet Take 1 tablet (20 mg total) by mouth daily at 6 PM.  . pregabalin (LYRICA) 75 MG capsule Take 1 capsule (75 mg total) by  mouth 2 (two) times daily.  . Red Yeast Rice Extract (RED YEAST RICE PO) Take 1,200 mg by mouth daily.  . Rivaroxaban (XARELTO) 15 MG TABS tablet Take 1 tablet (15 mg total) by mouth daily with supper.  .  furosemide  20 MG tablet TAKE 1 TABLET BY MOUTH EVERY DAY     Allergies:   Prednisone and Rosuvastatin   Social History   Socioeconomic History  . Marital status: Married    Spouse name: None  . Number of children: None  . Years of education: None  . Highest education level: None  Social Needs  . Financial resource strain: None  . Food insecurity - worry: None  . Food insecurity - inability: None  . Transportation needs - medical: None  . Transportation needs - non-medical: None  Occupational History  . None  Tobacco Use  . Smoking status: Never Smoker  . Smokeless tobacco: Never Used  Substance and Sexual Activity  . Alcohol use: No  . Drug use: No  . Sexual activity: None  Other Topics Concern  . None  Social History Narrative   Lives in [place] w/ wife. 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 Denies tobacco, alcohol, or drug use ever in lifetime.     Family History: The patient's family history includes Cancer in his sister and sister. ROS:   Please see the history of present illness.     All other systems reviewed and are negative.  EKGs/Labs/Other Studies Reviewed:    The following studies were reviewed today:  Echocardiogram 08/10/16 Study Conclusions  - Left ventricle: The cavity size was normal. There was mild   concentric hypertrophy. Systolic function was normal. The   estimated ejection fraction was in the range of 55% to 60%. Wall   motion was normal; there were no regional wall motion   abnormalities. - Mitral valve: There was mild to moderate regurgitation directed   eccentrically and posteriorly. - Left atrium: The atrium was mildly dilated. - Right atrium: The atrium was moderately to severely dilated. - Atrial septum:  No defect or patent foramen ovale was identified. - Tricuspid valve: There was mild-moderate regurgitation directed   centrally. - Pulmonary arteries: Systolic pressure was mildly to moderately   increased. PA peak pressure: 49 mm Hg (S).  EKG:  EKG is ordered today.  The ekg ordered today demonstrates atrial fibrillation with LAFB, 94 bpm.  Recent Labs: 08/09/2017: ALT 17; BUN 25; Creatinine, Ser 1.31; Hemoglobin 13.3; Platelets 97; Potassium 4.5; Sodium 144   Recent Lipid Panel    Component Value Date/Time   CHOL 117 08/09/2017 0840   TRIG 55 08/09/2017 0840   HDL 41 08/09/2017 0840  CHOLHDL 2.9 08/09/2017 0840   CHOLHDL 3.3 08/10/2016 0323   VLDL 9 08/10/2016 0323   LDLCALC 65 08/09/2017 0840    Physical Exam:    VS:  BP 134/80   Pulse 94   Ht 5' 11.5" (1.816 m)   Wt 186 lb 9.6 oz (84.6 kg)   BMI 25.66 kg/m     Wt Readings from Last 3 Encounters:  08/25/17 186 lb 9.6 oz (84.6 kg)  08/05/17 180 lb 3.2 oz (81.7 kg)  05/05/17 177 lb 9.6 oz (80.6 kg)     Physical Exam  Constitutional: He is oriented to person, place, and time. He appears well-developed and well-nourished.  Walking with cane  HENT:  Head: Normocephalic and atraumatic.  Neck: Normal range of motion. Neck supple. No JVD present.  Cardiovascular: Normal rate. An irregularly irregular rhythm present. Exam reveals gallop.  Pulmonary/Chest: Effort normal and breath sounds normal. No respiratory distress. He has no wheezes. He has no rales.  Abdominal: Soft. Bowel sounds are normal. He exhibits no distension. There is no tenderness.  Musculoskeletal: Normal range of motion. He exhibits edema.  1+ left LE edema, none on right.   Neurological: He is alert and oriented to person, place, and time.  Skin: Skin is warm and dry.  Psychiatric: He has a normal mood and affect. His behavior is normal. Thought content normal.    ASSESSMENT:    1. Atherosclerosis of native coronary artery of native heart without  angina pectoris   2. SOB (shortness of breath)   3. Essential hypertension   4. Acute on chronic combined systolic and diastolic congestive heart failure ([place])   5. Chronic atrial fibrillation (HCC)   6. Hyperlipidemia, unspecified hyperlipidemia type    PLAN:    In order of problems listed above:  Shortness of breath: Progressive over the last 2 weeks. Recent Hgb 13.3 08/09/17, will recheck CBC to evaluate for anemia since on anticoagulation. Unlikely to have PE as is anticoagulated. No orthopnea. Pt with chronic LLE edema, not felt to be worse per pt. Wt up 6 lbs in last 2 weeks. Cardiac gallop present. Has hx of CHF on lasix. Will check BNP today (recent Labs with SCr 1.31) and increase lasix to 40 mg daily. Low sodium diet. Recheck BMet in 1 week. Follow up in 3 weeks to assess breathing. Consider repeat echo/ischemic testing if no improvement.   CAD: S/P STEMI with BMS X2 to mid & prox LAD, total RCA with collaterals, mod Circ plaque in 2009. No recent ischemic evaluations. On BB, statin. No asa due to need for anticoagulation. No chest pain, new DOE as above. Will recheck pt in 3 weeks after increase in diuretic. If no improvement with diuresis, will plan for myoview. Reviewed alarm symptoms with pt and daughter to report to office or proceed to hospital.  Persistant atrial fibrillation: Rate controlled. metoprolol tartrate 12.5 mg bid,  Xarelto.15 mg reduced recently for renal function.   Hypertension: Continue current medications and increase in lasix.   Hyperlipidemia:   Pravastatin 20 mg daily and tolerating. LDL 65 on 08/09/17, at goal of <70. Continue current therapy.   Chronic combined systolic and diastolic CHF: EF 63-78% by echo 08/10/2016. lasix 20 daily, increase to 40 mg as above.   Medication Adjustments/Labs and Tests Ordered: Current medicines are reviewed at length with the patient today.  Concerns regarding medicines are outlined above. Labs and tests ordered and  medication changes are outlined in the patient instructions below:  Patient  Instructions  PMedication Instructions:  Your physician has recommended you make the following change in your medication:  1.  INCREASE the Lasix to 20 mg taking 2 tablets in the a.m.   Labwork: TODAY:  CBC. & PRO BNP 1 WEEK:  BMET  Testing/Procedures: None ordered  Follow-Up: Your physician recommends that you schedule a follow-up appointment in: 09/23/17 ARRIVE AT 11:15 TO SEE BRITTANY SIMMONS,PA-C   Any Other Special Instructions Will Be Listed Below (If Applicable).     If you need a refill on your cardiac medications before your next appointment, please call your pharmacy.      Signed, Daune Perch, NP  08/25/2017 12:54 PM    [facility]

## 2017-08-24 NOTE — Telephone Encounter (Signed)
New message     Patient son returning call to nurse. Please call

## 2017-08-24 NOTE — Telephone Encounter (Signed)
New message    Patient daughter calling back, states a Monday appointment is not acceptable. Request to speak with nurse.    Pt c/o Shortness Of Breath: STAT if SOB developed within the last 24 hours or pt is noticeably SOB on the phone  1. Are you currently SOB (can you hear that pt is SOB on the phone?yes, per daughter           2. How long have you been experiencing SOB? About 2 weeks   3. Are you SOB when sitting or when up moving around? Notices it more when he is moving around and going up the steps  4. Are you currently experiencing any other symptoms? No other symptoms.

## 2017-08-24 NOTE — Telephone Encounter (Signed)
I spoke with pt's son who is calling about pt's shortness of breath. Notices it when going up stairs.  Pt has appointment on 2/18 but son is requesting sooner appointment. I offered appointment tomorrow but they cannot come in tomorrow.  Requesting appointment for Friday.  I scheduled pt to see Cecilie Kicks, NP on 08/26/17 at 2:00.  I asked son to have pt update DPR when here for appointment.

## 2017-08-25 ENCOUNTER — Ambulatory Visit: Payer: Medicare Other | Admitting: Cardiology

## 2017-08-25 ENCOUNTER — Encounter: Payer: Self-pay | Admitting: Cardiology

## 2017-08-25 VITALS — BP 134/80 | HR 94 | Ht 71.5 in | Wt 186.6 lb

## 2017-08-25 DIAGNOSIS — E785 Hyperlipidemia, unspecified: Secondary | ICD-10-CM | POA: Diagnosis not present

## 2017-08-25 DIAGNOSIS — I482 Chronic atrial fibrillation, unspecified: Secondary | ICD-10-CM

## 2017-08-25 DIAGNOSIS — R0602 Shortness of breath: Secondary | ICD-10-CM

## 2017-08-25 DIAGNOSIS — I5043 Acute on chronic combined systolic (congestive) and diastolic (congestive) heart failure: Secondary | ICD-10-CM

## 2017-08-25 DIAGNOSIS — I10 Essential (primary) hypertension: Secondary | ICD-10-CM

## 2017-08-25 DIAGNOSIS — I2510 Atherosclerotic heart disease of native coronary artery without angina pectoris: Secondary | ICD-10-CM | POA: Diagnosis not present

## 2017-08-25 MED ORDER — FUROSEMIDE 20 MG PO TABS: 40.0000 mg | ORAL_TABLET | Freq: Every day | ORAL | 11 refills | Status: DC

## 2017-08-25 NOTE — Patient Instructions (Addendum)
PMedication Instructions:  Your physician has recommended you make the following change in your medication:  1.  INCREASE the Lasix to 20 mg taking 2 tablets in the a.m.   Labwork: TODAY:  CBC. & PRO BNP 1 WEEK:  BMET  Testing/Procedures: None ordered  Follow-Up: Your physician recommends that you schedule a follow-up appointment in: 09/23/17 ARRIVE AT 11:15 TO SEE BRITTANY SIMMONS,PA-C   Any Other Special Instructions Will Be Listed Below (If Applicable).     If you need a refill on your cardiac medications before your next appointment, please call your pharmacy.

## 2017-08-26 ENCOUNTER — Ambulatory Visit: Payer: Medicare Other | Admitting: Cardiology

## 2017-08-26 ENCOUNTER — Telehealth: Payer: Self-pay | Admitting: Cardiovascular Disease

## 2017-08-26 LAB — CBC
HEMATOCRIT: 39.6 % (ref 37.5–51.0)
Hemoglobin: 13.1 g/dL (ref 13.0–17.7)
MCH: 28.8 pg (ref 26.6–33.0)
MCHC: 33.1 g/dL (ref 31.5–35.7)
MCV: 87 fL (ref 79–97)
Platelets: 129 10*3/uL — ABNORMAL LOW (ref 150–379)
RBC: 4.55 x10E6/uL (ref 4.14–5.80)
RDW: 14.7 % (ref 12.3–15.4)
WBC: 6.2 10*3/uL (ref 3.4–10.8)

## 2017-08-26 LAB — PRO B NATRIURETIC PEPTIDE: NT-Pro BNP: 2120 pg/mL — ABNORMAL HIGH (ref 0–486)

## 2017-08-26 NOTE — Telephone Encounter (Signed)
[name] (Patient daughter) calling,  Pt c/o medication issue:  1. Name of Medication: Xarelto  2. How are you currently taking this medication (dosage and times per day)? 20 mg   3. Are you having a reaction (difficulty breathing--STAT)? no  4. What is your medication issue? Patient was told that dosage would decrease from 20 mg to 15 mg and then told dosage would stay at 20 mg. Patient needs a new prescription for Xarelto 20 mg

## 2017-08-26 NOTE — Telephone Encounter (Signed)
I spoke to the patient's daughter, [name]. I confirmed that the patient is to be on rivaroxaban 15 mg now due to renal function. I have called [facility] to clarify this and she will pick up the new prescription and start it.   Daune Perch, AGNP-C [facility] 08/26/2017  11:46 AM

## 2017-08-29 ENCOUNTER — Ambulatory Visit: Payer: Medicare Other | Admitting: Physician Assistant

## 2017-08-30 ENCOUNTER — Telehealth: Payer: Self-pay | Admitting: *Deleted

## 2017-08-30 NOTE — Telephone Encounter (Addendum)
Please note documentation on 08/23/17 Refill note regarding this & call to the pt.   ----- Message from Burnell Blanks, MD sent at 08/24/2017  1:14 PM EST ----- I agree that we should change his dose of Xarelto to 15 mg daily. Thanks, chris  ----- Message ----- From: Marcos Eke, RN Sent: 08/23/2017   8:06 AM To: Burnell Blanks, MD  Good Morning! The pt has requested a refill on Xarelto 20mg . The pt is 84 yrs old, Wt-81.7kg, Crea-1.31 on 08/09/17, and CrCl-42.28ml/min. CrCl is less than 50 & should be on Xarelto 15mg . On the last refill in 2018, Crea-1.06, age 48, weight 80.6, CrCl 52.8 therefore, pt was on correct dose. Please advise as the current CrCl of 42.77ml/min prompts a dose change. Thank you.

## 2017-09-02 ENCOUNTER — Other Ambulatory Visit: Payer: Medicare Other | Admitting: *Deleted

## 2017-09-02 DIAGNOSIS — R0602 Shortness of breath: Secondary | ICD-10-CM

## 2017-09-03 ENCOUNTER — Other Ambulatory Visit: Payer: Self-pay | Admitting: Cardiology

## 2017-09-03 LAB — BASIC METABOLIC PANEL
BUN/Creatinine Ratio: 20 (ref 10–24)
BUN: 28 mg/dL (ref 10–36)
CO2: 25 mmol/L (ref 20–29)
Calcium: 9.3 mg/dL (ref 8.6–10.2)
Chloride: 99 mmol/L (ref 96–106)
Creatinine, Ser: 1.42 mg/dL — ABNORMAL HIGH (ref 0.76–1.27)
GFR, EST AFRICAN AMERICAN: 50 mL/min/{1.73_m2} — AB (ref >59)
GFR, EST NON AFRICAN AMERICAN: 43 mL/min/{1.73_m2} — AB (ref >59)
Glucose: 99 mg/dL (ref 65–99)
POTASSIUM: 3.5 mmol/L (ref 3.5–5.2)
SODIUM: 141 mmol/L (ref 134–144)

## 2017-09-03 MED ORDER — POTASSIUM CHLORIDE ER 10 MEQ PO TBCR: 10.0000 meq | EXTENDED_RELEASE_TABLET | Freq: Every day | ORAL | 5 refills | Status: DC

## 2017-09-12 ENCOUNTER — Telehealth: Payer: Self-pay | Admitting: Cardiology

## 2017-09-12 NOTE — Telephone Encounter (Signed)
Patient's daughter [name] calling (no DPR on file). Called and spoke to patient who gives permission to speak to [place].  [name] states that the patient has gained 5 lbs in the last week. She states that the patient's weight was down to 180 lbs and was 185 lbs this morning. She states that the patient's left foot has swelling. She states that she notices the swelling more in his abdomen. She states that the patient feels more SOB. Patient takes lasix 40 mg QD and k-dur 10 mEq QD. She states that the operator already gave patient appointment for tomorrow morning with Pecolia Ades, NP at 8:00 AM.  Instructed for patient to take extra 40 mg of lasix and extra 10 mEq of k-dur today. Instructed for patient to elevate his feet and wear compression stockings to help with swelling. Instructed for patient to avoid salt in his diet which also includes eating foods that are high in sodium (ie. canned foods, processed foods, etc.). Instructed for patient to continue to weigh daily at the same time with the same amount of clothes. [name] verbalized understanding and thanked me for the call.

## 2017-09-12 NOTE — Telephone Encounter (Signed)
New message    Pt c/o swelling: STAT is pt has developed SOB within 24 hours  1) How much weight have you gained and in what time span? A week 180-185  2) If swelling, where is the swelling located?stomach and left leg   3) Are you currently taking a fluid pill?  yes  4) Are you currently SOB? yes  5) Do you have a log of your daily weights (if so, list)?  Not with her   6) Have you gained 3 pounds in a day or 5 pounds in a week? Gradually over a week   7) Have you traveled recently? no

## 2017-09-13 ENCOUNTER — Telehealth: Payer: Self-pay | Admitting: Cardiology

## 2017-09-13 ENCOUNTER — Ambulatory Visit: Payer: Medicare Other | Admitting: Cardiology

## 2017-09-13 ENCOUNTER — Ambulatory Visit
Admission: RE | Admit: 2017-09-13 | Discharge: 2017-09-13 | Disposition: A | Discharge status: Home / Self Care | Payer: Medicare Other | Source: Ambulatory Visit | Attending: Cardiology | Admitting: Cardiology

## 2017-09-13 ENCOUNTER — Encounter: Payer: Self-pay | Admitting: Cardiology

## 2017-09-13 VITALS — BP 138/82 | HR 89 | Ht 70.75 in | Wt 182.0 lb

## 2017-09-13 DIAGNOSIS — E785 Hyperlipidemia, unspecified: Secondary | ICD-10-CM

## 2017-09-13 DIAGNOSIS — N183 Chronic kidney disease, stage 3 unspecified: Secondary | ICD-10-CM

## 2017-09-13 DIAGNOSIS — I5032 Chronic diastolic (congestive) heart failure: Secondary | ICD-10-CM | POA: Diagnosis not present

## 2017-09-13 DIAGNOSIS — I482 Chronic atrial fibrillation, unspecified: Secondary | ICD-10-CM

## 2017-09-13 DIAGNOSIS — R0602 Shortness of breath: Secondary | ICD-10-CM

## 2017-09-13 DIAGNOSIS — R0981 Nasal congestion: Secondary | ICD-10-CM | POA: Insufficient documentation

## 2017-09-13 DIAGNOSIS — I10 Essential (primary) hypertension: Secondary | ICD-10-CM

## 2017-09-13 DIAGNOSIS — I872 Venous insufficiency (chronic) (peripheral): Secondary | ICD-10-CM | POA: Diagnosis not present

## 2017-09-13 DIAGNOSIS — I2510 Atherosclerotic heart disease of native coronary artery without angina pectoris: Secondary | ICD-10-CM

## 2017-09-13 NOTE — Patient Instructions (Addendum)
Medication Instructions:   FOR 5  DAYS ONLY:  1. TAKE LASIX 40 MG TWICE A DAY    2. TAKE  POTASSIUM 10 MEQ TWICE  A DAY   AFTER 5 DAYS THEN RESUME BACK TO  :  1. LASIX 40 MG DAILY   2. POTASSIUM 10 MEQ DAILY   If you need a refill on your cardiac medications before your next appointment, please call your pharmacy.  Labwork:  RETURN IN 1 WEEK FOR  BMET LABS    Testing/Procedures: A chest x-ray takes a picture of the organs and structures inside the chest, including the heart, lungs, and blood vessels. This test can show several things, including, whether the heart is enlarges; whether fluid is building up in the lungs; and whether pacemaker / defibrillator leads are still in place.  ADDRESS: [facility]  [place] [phone]    Follow-Up:  [place] IN 3 WEEKS    Any Other Special Instructions Will Be Listed Below (If Applicable).

## 2017-09-13 NOTE — Progress Notes (Addendum)
Cardiology Office Note:    Date:  09/13/2017   ID:  [name], DOB 09-29-1923, MRN [id number]  PCP:  Leeanne Rio, MD  Cardiologist:  Lauree Chandler, MD  Referring MD: Leeanne Rio, MD   Chief Complaint  Patient presents with  . Shortness of Breath    History of Present Illness:    [name] is a very pleasant  84 y.o. male with a past medical history significant for CAD, hyperlipidemia, atrial fib/flutter on Xarelto, prior CVA 07/2016, CHF.  The patient has known CAD S/P STEMI in 2009 with BMS x2 to mid and proximal LAD, total RCA occlusion with collaterals, moderate circumflex plaque.  He is known to have atrial fibrillation/flutter as well as prior DVT he has been on Xarelto.  He has not tolerated statins in the past.  He had a DVT in July 2015 and at that time was started on Xarelto.  This was stopped in spring 2016.  He was seen in the ED in August and September 2016 for dizziness felt to be due to his muscle relaxer.  Head CT and EKG were okay.  He was seen in the ED October 2017 with swollen legs.  Venous Dopplers were negative for DVT.  He was treated for cellulitis and given Lasix.  EKG that day showed A. fib.  He was restarted on Xarelto.  He was admitted to [facility] in November 2017 with pneumonia, hemoptysis and CHF.  He was diuresed with IV Lasix.  Xarelto was stopped due to hemoptysis and anemia he then had a stroke in January 2018.  He has no residual deficits.  Echo in January 2018 showed normal LV systolic function with EF 55-60% and moderate mitral regurgitation.  I saw the patient in the office on 08/25/17 with complaints of increased dyspnea on exertion while walking uphill and leg weakness.  He had 1+ pitting edema in the left lower leg which is chronic for him, none on the right.  He was wearing compression stockings. He denied orthopnea or PND.  A proBNP was measured at 2120.  He was given 40 mg Lasix twice on that day and then increased  his usual dose to 40 mg daily.  I spoke to the patient's daughter on 09/02/17 who reported the patient's weight was down 6.6 pounds and he was breathing better although not quite back to baseline.  Mr Colgan has returned today at the request of his daughter, [name], with complaints of DOE. His daughter called in yest and was told to give pt extra 40 mg lasix. He reports good urine output.  Pt thinks he is breathing better today.  He denies orthopnea, PND. Stable edema. No fever, chills. He has been battling sinus congestion draining down throat which occurs often. Has a non-productive cough. No fever or chills. He sees an ENT. He also has leg weakness.   Daughter is concerned about the darkened skin on lower legs. Pt is wearing compression stockings. Distal pulses present, sensation intact. No leg pain with walking.    Past Medical History:  Diagnosis Date  . Coronary atherosclerosis of native coronary artery    a. Anterior STEMI 2009 s/p BMSx2 to mid & prox LAD and angioplasty to distal LAD. total RCA with collaterals, moderate Cx plaquing. a. EF 55-60% in 2013.  [name] Elevated troponin   . Gross hematuria   . Gross hematuria 11/18/2008   Qualifier: Diagnosis of  By: Owens Shark, RN, BSN, [name]    . Hemoptysis  05/16/2016  . HTN (hypertension)   . Myocardial infarct ([place]) 03/21/2008  . NSVT (nonsustained ventricular tachycardia) ([place])    a. Per DC summary from time of STEMI 2009.  [name] Paroxysmal atrial flutter ([place])    a. Abnl EKG 01/2012 concerning for atrial flutter, event monitor showed sinus bradycardia only and no pauses. Not felt to be a candidate for longterm anticoag due to hematuria.  . Phlebitis and thrombophlebitis of superficial vessels of lower extremities   . Pure hypercholesterolemia    a. Has not tolerated statins in the past.  . Right leg DVT ([place])    a. Dx 01/2014.  [name] Sinus bradycardia    a. By prior event monitor.  . Sinus bradycardia    a. By prior event monitor.   .  Thrombocytopenia ([place])   . Urinary retention 06/20/2011    Past Surgical History:  Procedure Laterality Date  . CARPAL TUNNEL RELEASE    . CORONARY STENT PLACEMENT  2009  . REPLACEMENT TOTAL KNEE BILATERAL     Left 2014 (Dr. Eddie Dibbles); right 2012 (Dr. Redmond Pulling)  . trigger finger surgery      Current Medications: Current Meds  Medication Sig  . fluticasone (FLONASE) 50 MCG/ACT nasal spray Place 2 sprays into both nostrils daily as needed for allergies or rhinitis.  . furosemide (LASIX) 20 MG tablet Take 2 tablets (40 mg total) by mouth daily.  . metoprolol tartrate (LOPRESSOR) 25 MG tablet TAKE 1/2 TABLET BY MOUTH 2 TIMES DAILY  . nitroGLYCERIN (NITROSTAT) 0.4 MG SL tablet Place 0.4 mg under the tongue every 5 (five) minutes as needed for chest pain.  . potassium chloride (K-DUR) 10 MEQ tablet Take 1 tablet (10 mEq total) by mouth daily.  . pravastatin (PRAVACHOL) 20 MG tablet Take 1 tablet (20 mg total) by mouth daily at 6 PM.  . pregabalin (LYRICA) 75 MG capsule Take 1 capsule (75 mg total) by mouth 2 (two) times daily.  . Red Yeast Rice Extract (RED YEAST RICE PO) Take 1,200 mg by mouth daily.  . Rivaroxaban (XARELTO) 15 MG TABS tablet Take 1 tablet (15 mg total) by mouth daily with supper.     Allergies:   Prednisone and Rosuvastatin   Social History   Socioeconomic History  . Marital status: Married    Spouse name: None  . Number of children: None  . Years of education: None  . Highest education level: None  Social Needs  . Financial resource strain: None  . Food insecurity - worry: None  . Food insecurity - inability: None  . Transportation needs - medical: None  . Transportation needs - non-medical: None  Occupational History  . None  Tobacco Use  . Smoking status: Never Smoker  . Smokeless tobacco: Never Used  Substance and Sexual Activity  . Alcohol use: No  . Drug use: No  . Sexual activity: None  Other Topics Concern  . None  Social History Narrative   Lives  in [place]. Wife has died. Lives next door to his daughter [name]. Also has a very supportive daughter, [name] who lives out of town. 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 Denies tobacco, alcohol, or drug use ever in lifetime.     Family History: The patient's family history includes Cancer in his sister and sister. ROS:   Please see the history of present illness.     All other systems reviewed and are negative.  EKGs/Labs/Other Studies Reviewed:    The following studies  were reviewed today:  Echocardiogram 08/10/16 Study Conclusions  - Left ventricle: The cavity size was normal. There was mild concentric hypertrophy. Systolic function was normal. The estimated ejection fraction was in the range of 55% to 60%. Wall motion was normal; there were no regional wall motion abnormalities. - Mitral valve: There was mild to moderate regurgitation directed eccentrically and posteriorly. - Left atrium: The atrium was mildly dilated. - Right atrium: The atrium was moderately to severely dilated. - Atrial septum: No defect or patent foramen ovale was identified. - Tricuspid valve: There was mild-moderate regurgitation directed centrally. - Pulmonary arteries: Systolic pressure was mildly to moderately increased. PA peak pressure: 49 mm Hg (S).  EKG:  EKG is  ordered today.  The ekg ordered today demonstrates Atrial fibrillation with LAFB, 89 bpm, no changes from prior EKGs  Recent Labs: 08/09/2017: ALT 17 08/25/2017: Hemoglobin 13.1; NT-Pro BNP 2,120; Platelets 129 09/02/2017: BUN 28; Creatinine, Ser 1.42; Potassium 3.5; Sodium 141   Recent Lipid Panel    Component Value Date/Time   CHOL 117 08/09/2017 0840   TRIG 55 08/09/2017 0840   HDL 41 08/09/2017 0840   CHOLHDL 2.9 08/09/2017 0840   CHOLHDL 3.3 08/10/2016 0323   VLDL 9 08/10/2016 0323   LDLCALC 65 08/09/2017 0840    Physical Exam:    VS:  BP 138/82   Pulse 89   Ht 5' 10.75" (1.797 m)    Wt 182 lb (82.6 kg)   BMI 25.56 kg/m     Wt Readings from Last 3 Encounters:  09/13/17 182 lb (82.6 kg)  08/25/17 186 lb 9.6 oz (84.6 kg)  08/05/17 180 lb 3.2 oz (81.7 kg)     Physical Exam  Constitutional: He is oriented to person, place, and time. He appears well-developed and well-nourished.  HENT:  Head: Normocephalic and atraumatic.  Neck: Normal range of motion. Neck supple. JVD present.  Cardiovascular: Normal rate. An irregularly irregular rhythm present. Exam reveals gallop. Exam reveals no friction rub.  No murmur heard. Pulmonary/Chest: Effort normal and breath sounds normal. No respiratory distress. He has no wheezes.  Few rales in posterior bases.   Abdominal: Soft. Bowel sounds are normal. He exhibits no distension. There is no tenderness.  Musculoskeletal: Normal range of motion. He exhibits edema.  1+ LLE, none in RLE  Neurological: He is alert and oriented to person, place, and time.  Skin: Skin is warm and dry.  Psychiatric: He has a normal mood and affect. His behavior is normal. Thought content normal.    ASSESSMENT:    1. CHF (congestive heart failure), NYHA class II, chronic, diastolic ([place])   2. SOB (shortness of breath)   3. Sinus congestion   4. Atherosclerosis of native coronary artery of native heart without angina pectoris   5. Chronic atrial fibrillation (HCC)   6. Essential hypertension   7. Hyperlipidemia, unspecified hyperlipidemia type   8. Venous insufficiency (chronic) (peripheral)    PLAN:    Dr. Irish Lack also assessed the patient and the plan below reflects his input.   In order of problems listed above:  Dyspnea on exertion: On 2/14 Increased in Lasix to 40 mg daily initially with weight reduction and improved breathing although not back to baseline. DOE increased over the last few days. Improved today after extra 40 mg lasix given yesterday. Will increase lasix to 40 mg BID for 5 days then daily. Monitor Bmet in 1 week. Continue with  daily wts, daughter knows what to report. Low sodium  diet.   Acute on Chronic diastolic CHF, EF 69-67% by echo 08/10/16.  Pt with DOE, few rales in lung bases, + JVD and cardiac gallop. No orthopnea. Stable edema. Will increase lasix as above.  Sinus congestion/cough: Daughter says that pt had life threatening pneumonia last year. No fever/chills. Has few crackles in lung bases, Congestion and drainage down his throat, non productive cough. Will check CXR to evaluate for PNA and edema.   CAD: S/P STEMI with BMS x2 to mid and proximal LAD, total RCA occlusion with collaterals, moderate circumflex plaque in 2009.  No recent ischemic evaluations.  On beta-blocker, statin. No chest discomfort.   CKD: serum creatinine Baseline over the last 2 years is~1.3. Will recheck BMet in 7 days after diuresis.   Persistent atrial fibrillation: Rate controlled on metoprolol tartrate 25 mg twice daily.  Xarelto 15 mg daily for stroke risk reduction, recently reduced dose for renal function.   Hypertension: Blood pressure well controlled.  Hyperlipidemia: Pravastatin 20 mg daily and tolerating LDL 65 on 08/09/17, at goal less than 70.  Continue current therapy  Chronic venous insufficiency: Lower legs with skin changes. Chronic LLE edema. Wearing compression stockings. Discussed elevating legs as much as possible. Pt follows low sodium diet. Discussed with daughter to watch for any sores or breaks in skin and have evaluated early at PCP.   Medication Adjustments/Labs and Tests Ordered: Current medicines are reviewed at length with the patient today.  Concerns regarding medicines are outlined above. Labs and tests ordered and medication changes are outlined in the patient instructions below:  Patient Instructions  Medication Instructions:   FOR 5  DAYS ONLY:  1. TAKE LASIX 40 MG TWICE A DAY    2. TAKE  POTASSIUM 10 MEQ TWICE  A DAY   AFTER 5 DAYS THEN RESUME BACK TO  :  1. LASIX 40 MG DAILY   2. POTASSIUM  10 MEQ DAILY   If you need a refill on your cardiac medications before your next appointment, please call your pharmacy.  Labwork:  RETURN IN 1 WEEK FOR  BMET LABS    Testing/Procedures: A chest x-ray takes a picture of the organs and structures inside the chest, including the heart, lungs, and blood vessels. This test can show several things, including, whether the heart is enlarges; whether fluid is building up in the lungs; and whether pacemaker / defibrillator leads are still in place.  ADDRESS: [facility]  [place] [phone]    Follow-Up:  Krystall Kruckenberg IN 3 WEEKS    Any Other Special Instructions Will Be Listed Below (If Applicable).                                                                                                                                                     Signed, Daune Perch, NP  09/13/2017 9:11 AM    [facility]  I have examined the patient and reviewed assessment and plan and discussed with patient.  Agree with above as stated.   Patient may have some volume overload from acute on chronic diastolic heart failure.  Increase Lasix to 40 mg BID for 3 days.  THen back to 40 mg daily.  Check CXR.  Check electrolytes in 1-2 weeks.   AFib, rate controlled.   Larae Grooms

## 2017-09-13 NOTE — Telephone Encounter (Signed)
[name] pt's daughter called back and state to please call [place] with anything about there dad.  She is on jury duty.

## 2017-09-19 ENCOUNTER — Ambulatory Visit: Payer: Medicare Other | Admitting: Cardiology

## 2017-09-23 ENCOUNTER — Other Ambulatory Visit: Payer: Medicare Other

## 2017-09-23 ENCOUNTER — Ambulatory Visit: Payer: Medicare Other | Admitting: Cardiology

## 2017-09-23 DIAGNOSIS — I5032 Chronic diastolic (congestive) heart failure: Secondary | ICD-10-CM

## 2017-09-24 LAB — BASIC METABOLIC PANEL
BUN / CREAT RATIO: 24 (ref 10–24)
BUN: 33 mg/dL (ref 10–36)
CO2: 27 mmol/L (ref 20–29)
CREATININE: 1.38 mg/dL — AB (ref 0.76–1.27)
Calcium: 9 mg/dL (ref 8.6–10.2)
Chloride: 99 mmol/L (ref 96–106)
GFR calc Af Amer: 51 mL/min/{1.73_m2} — ABNORMAL LOW (ref >59)
GFR, EST NON AFRICAN AMERICAN: 44 mL/min/{1.73_m2} — AB (ref >59)
Glucose: 103 mg/dL — ABNORMAL HIGH (ref 65–99)
Potassium: 3.8 mmol/L (ref 3.5–5.2)
SODIUM: 142 mmol/L (ref 134–144)

## 2017-09-26 ENCOUNTER — Telehealth: Payer: Self-pay | Admitting: *Deleted

## 2017-09-26 ENCOUNTER — Telehealth: Payer: Self-pay | Admitting: Cardiovascular Disease

## 2017-09-26 MED ORDER — POTASSIUM CHLORIDE ER 10 MEQ PO TBCR: 10.0000 meq | EXTENDED_RELEASE_TABLET | Freq: Every day | ORAL | 5 refills | Status: DC

## 2017-09-26 NOTE — Telephone Encounter (Signed)
Walk in Pt Form-pt asking for refills. Placed in [place] doc box.

## 2017-09-26 NOTE — Telephone Encounter (Signed)
Spoke with the pts daughter and endorsed stable labs per Daune Perch NP.  Per Daughter [name], the pt will need his Kdur 10 mEq po daily refilled and sent to his confirmed pharmacy of choice.  Sent this prescription in, as indicated in [place] last OV with the pt. Daughter verbalized understanding and agrees with this plan.

## 2017-09-26 NOTE — Telephone Encounter (Signed)
-----   Message from Daune Perch, NP sent at 09/26/2017  7:21 AM EDT ----- Please call daughter [name] to inform her that the patient's labs, kidney function and potassium, are stable. No changes.  Daune Perch, NP

## 2017-10-04 ENCOUNTER — Encounter: Payer: Self-pay | Admitting: Cardiology

## 2017-10-04 ENCOUNTER — Ambulatory Visit: Payer: Medicare Other | Admitting: Cardiology

## 2017-10-04 VITALS — BP 126/70 | HR 80 | Ht 71.5 in | Wt 180.8 lb

## 2017-10-04 DIAGNOSIS — I5043 Acute on chronic combined systolic (congestive) and diastolic (congestive) heart failure: Secondary | ICD-10-CM | POA: Diagnosis not present

## 2017-10-04 DIAGNOSIS — I10 Essential (primary) hypertension: Secondary | ICD-10-CM | POA: Diagnosis not present

## 2017-10-04 DIAGNOSIS — I482 Chronic atrial fibrillation, unspecified: Secondary | ICD-10-CM

## 2017-10-04 MED ORDER — NITROGLYCERIN 0.4 MG SL SUBL: 0.4000 mg | SUBLINGUAL_TABLET | SUBLINGUAL | 3 refills | Status: DC | PRN

## 2017-10-04 NOTE — Patient Instructions (Signed)
Medication Instructions:  Your physician recommends that you continue on your current medications as directed. Please refer to the Current Medication list given to you today.  Labwork: None  Testing/Procedures: None  Follow-Up: Your physician recommends that you schedule a follow-up appointment in: 4 months with Dr. Angelena Form or Pecolia Ades, NP.    Any Other Special Instructions Will Be Listed Below (If Applicable).     If you need a refill on your cardiac medications before your next appointment, please call your pharmacy.

## 2017-10-04 NOTE — Progress Notes (Signed)
Cardiology Office Note:    Date:  10/04/2017   ID:  [name], DOB 30-Aug-1923, MRN [id number]  PCP:  Leeanne Rio, MD  Cardiologist:  Lauree Chandler, MD  Referring MD: Leeanne Rio, MD   Chief Complaint  Patient presents with  . Follow-up    CHF    History of Present Illness:    [name] is a 84 y.o. male with a past medical history significant for CAD, hyperlipidemia, atrial fib/flutter on Xarelto, prior CVA 0/6237, diastolic CHF.  The patient has had increased dyspnea on exertion beginning in February with crackles in the bases, with JVD and cardiac gallop.  We have been adjusting his diuretic therapy and he is back today for follow-up.  [name] is here today with his daughter and is very bright and cheerful.  Says that he is much better, back to normal. He is taking lasix 40 mg daily and tolerating it well. His daughter has his wt log. His wt came down to 179 and has been maintaining within about 2 pounds. He still has the mild chronic left LE, but his edema has improved. He is wearing compression stockings. He and his daughter think that his current treatment regimen is ideal.   He does still have nasal congestion, occasional cough and feels mucus in his throat. We did a chest Xray last month to rule out pneumonia. He uses Flonase occasionally and his daughter is asking whether he can take Claritin.  I advised the patient and his daughter that he should try Flonase every day and can use Claritin and he should see a result within the next couple of weeks.   Past Medical History:  Diagnosis Date  . Coronary atherosclerosis of native coronary artery    a. Anterior STEMI 2009 s/p BMSx2 to mid & prox LAD and angioplasty to distal LAD. total RCA with collaterals, moderate Cx plaquing. a. EF 55-60% in 2013.  [name] Elevated troponin   . Gross hematuria   . Gross hematuria 11/18/2008   Qualifier: Diagnosis of  By: Owens Shark, RN, BSN, [name]    .  Hemoptysis 05/16/2016  . HTN (hypertension)   . Myocardial infarct ([place]) 03/21/2008  . NSVT (nonsustained ventricular tachycardia) ([place])    a. Per DC summary from time of STEMI 2009.  [name] Paroxysmal atrial flutter ([place])    a. Abnl EKG 01/2012 concerning for atrial flutter, event monitor showed sinus bradycardia only and no pauses. Not felt to be a candidate for longterm anticoag due to hematuria.  . Phlebitis and thrombophlebitis of superficial vessels of lower extremities   . Pure hypercholesterolemia    a. Has not tolerated statins in the past.  . Right leg DVT ([place])    a. Dx 01/2014.  [name] Sinus bradycardia    a. By prior event monitor.  . Sinus bradycardia    a. By prior event monitor.   . Thrombocytopenia ([place])   . Urinary retention 06/20/2011    Past Surgical History:  Procedure Laterality Date  . CARPAL TUNNEL RELEASE    . CORONARY STENT PLACEMENT  2009  . REPLACEMENT TOTAL KNEE BILATERAL     Left 2014 (Dr. Eddie Dibbles); right 2012 (Dr. Redmond Pulling)  . trigger finger surgery      Current Medications: Current Meds  Medication Sig  . fluticasone (FLONASE) 50 MCG/ACT nasal spray Place 2 sprays into both nostrils daily as needed for allergies or rhinitis.  . furosemide (LASIX) 20 MG tablet Take 2 tablets (40 mg  total) by mouth daily.  . metoprolol tartrate (LOPRESSOR) 25 MG tablet TAKE 1/2 TABLET BY MOUTH 2 TIMES DAILY  . nitroGLYCERIN (NITROSTAT) 0.4 MG SL tablet Place 1 tablet (0.4 mg total) under the tongue every 5 (five) minutes as needed for chest pain.  . potassium chloride (K-DUR) 10 MEQ tablet Take 1 tablet (10 mEq total) by mouth daily.  . pravastatin (PRAVACHOL) 20 MG tablet Take 1 tablet (20 mg total) by mouth daily at 6 PM.  . pregabalin (LYRICA) 75 MG capsule Take 1 capsule (75 mg total) by mouth 2 (two) times daily.  . Red Yeast Rice Extract (RED YEAST RICE PO) Take 1,200 mg by mouth daily.  . Rivaroxaban (XARELTO) 15 MG TABS tablet Take 1 tablet (15 mg total) by mouth daily with  supper.  . [DISCONTINUED] nitroGLYCERIN (NITROSTAT) 0.4 MG SL tablet Place 0.4 mg under the tongue every 5 (five) minutes as needed for chest pain.     Allergies:   Prednisone and Rosuvastatin   Social History   Socioeconomic History  . Marital status: Married    Spouse name: Not on file  . Number of children: Not on file  . Years of education: Not on file  . Highest education level: Not on file  Occupational History  . Not on file  Social Needs  . Financial resource strain: Not on file  . Food insecurity:    Worry: Not on file    Inability: Not on file  . Transportation needs:    Medical: Not on file    Non-medical: Not on file  Tobacco Use  . Smoking status: Never Smoker  . Smokeless tobacco: Never Used  Substance and Sexual Activity  . Alcohol use: No  . Drug use: No  . Sexual activity: Not on file  Lifestyle  . Physical activity:    Days per week: Not on file    Minutes per session: Not on file  . Stress: Not on file  Relationships  . Social connections:    Talks on phone: Not on file    Gets together: Not on file    Attends religious service: Not on file    Active member of club or organization: Not on file    Attends meetings of clubs or organizations: Not on file    Relationship status: Not on file  Other Topics Concern  . Not on file  Social History Narrative   Lives in [place]. Wife has died. Lives next door to his daughter [name]. Also has a very supportive daughter, [name] who lives out of town. 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 Denies tobacco, alcohol, or drug use ever in lifetime.     Family History: The patient's family history includes Cancer in his sister and sister. ROS:   Please see the history of present illness.     All other systems reviewed and are negative.  EKGs/Labs/Other Studies Reviewed:    The following studies were reviewed today:  Echocardiogram 08/10/16 Study Conclusions  - Left ventricle: The  cavity size was normal. There was mild concentric hypertrophy. Systolic function was normal. The estimated ejection fraction was in the range of 55% to 60%. Wall motion was normal; there were no regional wall motion abnormalities. - Mitral valve: There was mild to moderate regurgitation directed eccentrically and posteriorly. - Left atrium: The atrium was mildly dilated. - Right atrium: The atrium was moderately to severely dilated. - Atrial septum: No defect or patent foramen ovale was  identified. - Tricuspid valve: There was mild-moderate regurgitation directed centrally. - Pulmonary arteries: Systolic pressure was mildly to moderately increased. PA peak pressure: 49 mm Hg (S).   EKG:  EKG is not ordered today.    Recent Labs: 08/09/2017: ALT 17 08/25/2017: Hemoglobin 13.1; NT-Pro BNP 2,120; Platelets 129 09/23/2017: BUN 33; Creatinine, Ser 1.38; Potassium 3.8; Sodium 142   Recent Lipid Panel    Component Value Date/Time   CHOL 117 08/09/2017 0840   TRIG 55 08/09/2017 0840   HDL 41 08/09/2017 0840   CHOLHDL 2.9 08/09/2017 0840   CHOLHDL 3.3 08/10/2016 0323   VLDL 9 08/10/2016 0323   LDLCALC 65 08/09/2017 0840    Physical Exam:    VS:  BP 126/70   Pulse 80   Ht 5' 11.5" (1.816 m)   Wt 180 lb 12.8 oz (82 kg)   BMI 24.87 kg/m     Wt Readings from Last 3 Encounters:  10/04/17 180 lb 12.8 oz (82 kg)  09/13/17 182 lb (82.6 kg)  08/25/17 186 lb 9.6 oz (84.6 kg)     Physical Exam  Constitutional: He is oriented to person, place, and time. He appears well-developed and well-nourished. No distress.  HENT:  Head: Normocephalic.  Neck: Normal range of motion. Neck supple. No JVD present.  Cardiovascular: Normal rate, regular rhythm and intact distal pulses. Exam reveals gallop. Exam reveals no friction rub.  No murmur heard. Possibly split S2  Pulmonary/Chest: Effort normal and breath sounds normal. No respiratory distress. He has no wheezes. He has no  rales.  Abdominal: Soft. Bowel sounds are normal.  Musculoskeletal: Normal range of motion. He exhibits edema.  Trace pedal edema.  Compression stockings on bilaterally  Neurological: He is alert and oriented to person, place, and time.  Skin: Skin is warm and dry.  Psychiatric: He has a normal mood and affect. His behavior is normal. Thought content normal.    ASSESSMENT:    1. Acute on chronic combined systolic and diastolic congestive heart failure ([place])   2. Chronic atrial fibrillation (HCC)   3. Essential hypertension    PLAN:    In order of problems listed above:  Acute on chronic diastolic CHF: EF 95-28% by echo 08/10/16.  Patient with increased dyspnea on exertion in February.  We have increased Lasix to 40 mg daily. Breathing is much better, no DOE. Edema better, left foot with mild chronic edema.  Kidney function was stable on labs. Wearing compression stockings.  Reinforced sodium limitation.  Patient is feeling very well.  Continue current therapy.  I offered to stretch follow-up out to 6 months, but the patient and his daughter prefer a closer follow-up.  Chronic atrial fibrillation: Rate controlled.  On Xarelto for stroke risk reduction.  No unusual bleeding  Hypertension: Blood pressure well controlled   Medication Adjustments/Labs and Tests Ordered: Current medicines are reviewed at length with the patient today.  Concerns regarding medicines are outlined above. Labs and tests ordered and medication changes are outlined in the patient instructions below:  Patient Instructions  Medication Instructions:  Your physician recommends that you continue on your current medications as directed. Please refer to the Current Medication list given to you today.  Labwork: None  Testing/Procedures: None  Follow-Up: Your physician recommends that you schedule a follow-up appointment in: 4 months with Dr. Angelena Form or Pecolia Ades, NP.    Any Other Special Instructions Will Be  Listed Below (If Applicable).     If you need a refill  on your cardiac medications before your next appointment, please call your pharmacy.      Signed, Daune Perch, NP  10/04/2017 8:37 AM    [facility]

## 2017-10-17 ENCOUNTER — Telehealth: Payer: Self-pay | Admitting: Cardiovascular Disease

## 2017-10-17 NOTE — Telephone Encounter (Signed)
NEW MESSAGE    Pt c/o swelling: STAT is pt has developed SOB within 24 hours   1) How much weight have you gained and in what time span? N/A  2) If swelling, where is the swelling located? LEGS  3) Are you currently taking a fluid pill? YES  4) Are you currently SOB? NO  5) Do you have a log of your daily weights (if so, list)? 182.3, 181.4, 1802  6) Have you gained 3 pounds in a day or 5 pounds in a week? N/A  7) Have you traveled recently? NO

## 2017-10-17 NOTE — Telephone Encounter (Signed)
I spoke with pt's daughter and gave her instructions from Daune Perch, NP. Daughter reports in the past they have given pt extra dose of potassium when taking extra lasix.  I told her this would be OK to take extra dose potassium also while taking extra lasix.

## 2017-10-17 NOTE — Telephone Encounter (Signed)
I spoke with [name]. She report pt's weight has been at 180 lbs until the last 2 days. Yesterday was 181.4 lb and today 182.3.  She states pt felt his knees were swollen over the weekend. Today she noted swelling in feet. No shortness of breath. Pt told [name] his legs felt heavy. Is taking furosemide 40 mg daily.

## 2017-10-17 NOTE — Telephone Encounter (Signed)
Please have pt take his lasix two times per day for 3 or 4 days. This has helped in the past.

## 2017-11-11 ENCOUNTER — Telehealth: Payer: Self-pay | Admitting: Cardiovascular Disease

## 2017-11-11 NOTE — Telephone Encounter (Signed)
New Message   Pt c/o swelling: STAT is pt has developed SOB within 24 hours  1) How much weight have you gained and in what time span? 2lbs in 4 days  2) If swelling, where is the swelling located?  Stomach, left leg   Are you currently taking a fluid pill? Yes, lasix 3) Are you currently SOB? no  4) Do you have a log of your daily weights (if so, list)? Monday 180.2 Today 182.5  5) Have you gained 3 pounds in a day or 5 pounds in a week? no  6) Have you traveled recently? no

## 2017-11-11 NOTE — Telephone Encounter (Signed)
Spoke with pt's daughter, [name] who is concerned with her dad's abdominal swelling and weight gain. I advised her to give him one extra dose of lasix today, reduce fluid and sodium intake, and keep legs elevated. I asked her to call back on Monday if she did not see an improvement. She verbalized understanding and had no additional questions.

## 2017-11-22 ENCOUNTER — Telehealth: Payer: Self-pay | Admitting: Cardiovascular Disease

## 2017-11-22 NOTE — Telephone Encounter (Signed)
I spoke with pt's daughter and gave her information from pharmacist

## 2017-11-22 NOTE — Telephone Encounter (Signed)
There is no interaction with methocarbamol and his cardiovascular medications.

## 2017-11-22 NOTE — Telephone Encounter (Signed)
New message    Pt c/o medication issue:  1. Name of Medication: Methocarbamol  2. How are you currently taking this medication (dosage and times per day)? 500mg   3. Are you having a reaction (difficulty breathing--STAT)? no  4. What is your medication issue? Daughter wants to be sure methocarbamol is ok to take with heart meds.

## 2017-11-30 DIAGNOSIS — M79675 Pain in left toe(s): Secondary | ICD-10-CM

## 2017-11-30 DIAGNOSIS — B351 Tinea unguium: Secondary | ICD-10-CM | POA: Insufficient documentation

## 2017-11-30 DIAGNOSIS — M79674 Pain in right toe(s): Secondary | ICD-10-CM | POA: Insufficient documentation

## 2017-12-17 ENCOUNTER — Other Ambulatory Visit: Payer: Self-pay | Admitting: Family Medicine

## 2017-12-27 ENCOUNTER — Emergency Department (HOSPITAL_COMMUNITY)
Admission: EM | Admit: 2017-12-27 | Discharge: 2017-12-27 | Disposition: A | Discharge status: Home / Self Care | Payer: Medicare Other | Attending: Emergency Medicine | Admitting: Emergency Medicine

## 2017-12-27 ENCOUNTER — Encounter (HOSPITAL_COMMUNITY): Payer: Self-pay | Admitting: Emergency Medicine

## 2017-12-27 ENCOUNTER — Other Ambulatory Visit: Payer: Self-pay

## 2017-12-27 DIAGNOSIS — M62830 Muscle spasm of back: Secondary | ICD-10-CM | POA: Diagnosis not present

## 2017-12-27 DIAGNOSIS — I5032 Chronic diastolic (congestive) heart failure: Secondary | ICD-10-CM | POA: Diagnosis not present

## 2017-12-27 DIAGNOSIS — Z96653 Presence of artificial knee joint, bilateral: Secondary | ICD-10-CM | POA: Diagnosis not present

## 2017-12-27 DIAGNOSIS — M545 Low back pain, unspecified: Secondary | ICD-10-CM

## 2017-12-27 DIAGNOSIS — I2510 Atherosclerotic heart disease of native coronary artery without angina pectoris: Secondary | ICD-10-CM | POA: Diagnosis not present

## 2017-12-27 DIAGNOSIS — Z7901 Long term (current) use of anticoagulants: Secondary | ICD-10-CM | POA: Diagnosis not present

## 2017-12-27 DIAGNOSIS — I110 Hypertensive heart disease with heart failure: Secondary | ICD-10-CM | POA: Insufficient documentation

## 2017-12-27 MED ORDER — HYDROCODONE-ACETAMINOPHEN 5-325 MG PO TABS
1.0000 | ORAL_TABLET | Freq: Once | ORAL | Status: AC
  Administered 2017-12-27: 1 via ORAL
  Filled 2017-12-27: qty 1

## 2017-12-27 MED ORDER — ACETAMINOPHEN 325 MG PO TABS
650.0000 mg | ORAL_TABLET | Freq: Once | ORAL | Status: AC
  Administered 2017-12-27: 650 mg via ORAL
  Filled 2017-12-27: qty 2

## 2017-12-27 MED ORDER — METHOCARBAMOL 500 MG PO TABS
500.0000 mg | ORAL_TABLET | Freq: Once | ORAL | Status: AC
  Administered 2017-12-27: 500 mg via ORAL
  Filled 2017-12-27: qty 1

## 2017-12-27 MED ORDER — HYDROCODONE-ACETAMINOPHEN 5-325 MG PO TABS: 1.0000 | ORAL_TABLET | ORAL | 0 refills | Status: DC | PRN

## 2017-12-27 NOTE — ED Triage Notes (Signed)
Pt arrives via EMS from home with reports of sudden severe lower back pain that started at 3am and wasn't able to ambulate. Pt denies any recent falls or trauma. Hx of MI, stents, Afib, HTN, lower back injury from mower accident. Pt reports numbness to lower right foot but states this is normal.

## 2017-12-27 NOTE — ED Provider Notes (Signed)
[facility] EMERGENCY DEPARTMENT Provider Note   CSN: [id number] Arrival date & time: 12/27/17  0400     History   Chief Complaint Chief Complaint  Patient presents with  . Back Pain    HPI [name] is a 84 y.o. male.  The patient arrives via EMS after waking around 4:00 with severe, sharp, grabbing low back pain. He is treated for chronic low back pain with periodic injections (Dr. Lynann Bologna) and TID Robaxin. Apparently he did not take his night time dose of Robaxin last night. No chest pain, abdominal pain, nausea or vomiting. The pain is sharp and grabbing when he moves in certain ways and better when at rest. No urinary or bowel issues. No radiation into the legs. No numbness (other than chronic right foot numbness which is unchanged tonight) or weakness.   The history is provided by the patient, a relative and a friend. No language interpreter was used.  Back Pain   Pertinent negatives include no chest pain, no fever, no numbness, no abdominal pain and no weakness.    Past Medical History:  Diagnosis Date  . Coronary atherosclerosis of native coronary artery    a. Anterior STEMI 2009 s/p BMSx2 to mid & prox LAD and angioplasty to distal LAD. total RCA with collaterals, moderate Cx plaquing. a. EF 55-60% in 2013.  [name] Elevated troponin   . Gross hematuria   . Gross hematuria 11/18/2008   Qualifier: Diagnosis of  By: Owens Shark, RN, BSN, [name]    . Hemoptysis 05/16/2016  . HTN (hypertension)   . Myocardial infarct ([place]) 03/21/2008  . NSVT (nonsustained ventricular tachycardia) ([place])    a. Per DC summary from time of STEMI 2009.  [name] Paroxysmal atrial flutter ([place])    a. Abnl EKG 01/2012 concerning for atrial flutter, event monitor showed sinus bradycardia only and no pauses. Not felt to be a candidate for longterm anticoag due to hematuria.  . Phlebitis and thrombophlebitis of superficial vessels of lower extremities   . Pure hypercholesterolemia    a.  Has not tolerated statins in the past.  . Right leg DVT ([place])    a. Dx 01/2014.  [name] Sinus bradycardia    a. By prior event monitor.  . Sinus bradycardia    a. By prior event monitor.   . Thrombocytopenia ([place])   . Urinary retention 06/20/2011    Patient Active Problem List   Diagnosis Date Noted  . Sinus congestion 09/13/2017  . Cough 04/25/2017  . Orthostatic hypotension 11/26/2016  . Laceration of right lower extremity 11/26/2016  . Laceration of right forearm 11/01/2016  . Health care maintenance 09/17/2016  . PAD (peripheral artery disease) ([place]) 08/13/2016  . Hyperlipidemia   . Cerebrovascular accident (CVA) ([place]) 08/09/2016  . Cold extremity without peripheral vascular disease   . Abnormal CT of the chest 07/21/2016  . Acute on chronic combined systolic and diastolic congestive heart failure ([place]) 06/17/2016  . Tinea pedis 05/27/2016  . NSTEMI (non-ST elevated myocardial infarction) ([place]) 05/16/2016  . Chronic atrial fibrillation ([place]) 05/16/2016  . Chronic ITP (idiopathic thrombocytopenia) (H[facility])   . CHF (congestive heart failure), NYHA class II, chronic, diastolic ([place])   . CAP (community acquired pneumonia) 05/15/2016  . Microscopic hematuria 09/28/2014  . Gait instability 09/27/2014  . Coronary atherosclerosis of native coronary artery   . Paroxysmal atrial flutter ([place])   . Pure hypercholesterolemia   . HTN (hypertension)   . Degenerative disk disease 12/20/2010    Past  Surgical History:  Procedure Laterality Date  . CARPAL TUNNEL RELEASE    . CORONARY STENT PLACEMENT  2009  . REPLACEMENT TOTAL KNEE BILATERAL     Left 2014 (Dr. Eddie Dibbles); right 2012 (Dr. Redmond Pulling)  . trigger finger surgery          Home Medications    Prior to Admission medications   Medication Sig Start Date End Date Taking? Authorizing Provider  fluticasone (FLONASE) 50 MCG/ACT nasal spray Place 2 sprays into both nostrils daily as needed for allergies or rhinitis.   Yes [provider]  furosemide (LASIX) 20 MG tablet Take 2 tablets (40 mg total) by mouth daily. 08/25/17 12/27/25 Yes Daune Perch, NP  guaiFENesin (MUCINEX) 600 MG 12 hr tablet Take by mouth 2 (two) times daily as needed for cough or to loosen phlegm.   Yes [provider]  loratadine (CLARITIN) 10 MG tablet Take 10 mg by mouth daily.   Yes [provider]  methocarbamol (ROBAXIN) 500 MG tablet Take 500 mg by mouth every 8 (eight) hours as needed for muscle spasms.   Yes [provider]  metoprolol tartrate (LOPRESSOR) 25 MG tablet TAKE 1/2 TABLET BY MOUTH 2 TIMES DAILY 05/17/17  Yes Burnell Blanks, MD  nitroGLYCERIN (NITROSTAT) 0.4 MG SL tablet Place 1 tablet (0.4 mg total) under the tongue every 5 (five) minutes as needed for chest pain. 10/04/17  Yes Daune Perch, NP  potassium chloride (K-DUR) 10 MEQ tablet Take 1 tablet (10 mEq total) by mouth daily. 09/26/17  Yes Daune Perch, NP  pravastatin (PRAVACHOL) 20 MG tablet TAKE 1 TABLET (20 MG TOTAL) BY MOUTH DAILY AT 6 PM. 12/20/17  Yes Leeanne Rio, MD  pregabalin (LYRICA) 75 MG capsule Take 1 capsule (75 mg total) by mouth 2 (two) times daily. 05/18/16  Yes Asencion Partridge, MD  Red Yeast Rice Extract (RED YEAST RICE PO) Take 1,200 mg by mouth daily.   Yes [provider]  Rivaroxaban (XARELTO) 15 MG TABS tablet Take 1 tablet (15 mg total) by mouth daily with supper. 08/24/17  Yes Burnell Blanks, MD  HYDROcodone-acetaminophen (NORCO/VICODIN) 5-325 MG tablet Take 1 tablet by mouth every 4 (four) hours as needed. 12/27/17   Charlann Lange, PA-C    Family History Family History  Problem Relation Age of Onset  . Cancer Sister   . Cancer Sister     Social History Social History   Tobacco Use  . Smoking status: Never Smoker  . Smokeless tobacco: Never Used  Substance Use Topics  . Alcohol use: No  . Drug use: No     Allergies   Prednisone and Rosuvastatin   Review of  Systems Review of Systems  Constitutional: Negative for chills and fever.  HENT: Negative.   Respiratory: Negative.   Cardiovascular: Negative.  Negative for chest pain.  Gastrointestinal: Negative.  Negative for abdominal pain.  Genitourinary: Negative for enuresis.  Musculoskeletal: Positive for back pain.       See HPI  Skin: Negative.  Negative for color change and rash.  Neurological: Negative.  Negative for weakness and numbness.     Physical Exam Updated Vital Signs BP 120/69   Pulse 81   Temp 98.1 F (36.7 C) (Oral)   Resp 17   Ht 5\' 10"  (1.778 m)   Wt 81.6 kg (180 lb)   SpO2 92%   BMI 25.83 kg/m   Physical Exam  Constitutional: He is oriented to person, place, and time. He appears  well-developed and well-nourished.  HENT:  Head: Normocephalic.  Neck: Normal range of motion. Neck supple.  Cardiovascular: Normal rate, regular rhythm and intact distal pulses.  Pulmonary/Chest: Effort normal and breath sounds normal. He has no wheezes. He has no rales.  Abdominal: Soft. Bowel sounds are normal. There is no tenderness. There is no rebound and no guarding.  Musculoskeletal: Normal range of motion.  Lower back has no swelling. There is no reproducible tenderness.   Neurological: He is alert and oriented to person, place, and time.  CN's 3-12 grossly intact. No lateralizing weakness. Reflexes are equal. Straight leg raise positive bilaterally at 45 degrees. No sensory deficits.    Skin: Skin is warm and dry. No rash noted.  Psychiatric: He has a normal mood and affect.     ED Treatments / Results  Labs (all labs ordered are listed, but only abnormal results are displayed) Labs Reviewed - No data to display  EKG EKG Interpretation  Date/Time:  Tuesday December 27 2017 04:06:29 EDT Ventricular Rate:  80 PR Interval:    QRS Duration: 121 QT Interval:  404 QTC Calculation: 466 R Axis:   -104 Text Interpretation:  Atrial fibrillation Right bundle branch block  Abnormal lateral Q waves Abnormal T, consider ischemia, lateral leads When compared with ECG of 08/09/2016, No significant change was found Confirmed by Delora Fuel ([id number]) on 12/27/2017 4:13:38 AM   Radiology No results found.  Procedures Procedures (including critical care time)  Medications Ordered in ED Medications  methocarbamol (ROBAXIN) tablet 500 mg (500 mg Oral Given 12/27/17 0425)  acetaminophen (TYLENOL) tablet 650 mg (650 mg Oral Given 12/27/17 0425)  HYDROcodone-acetaminophen (NORCO/VICODIN) 5-325 MG per tablet 1 tablet (1 tablet Oral Given 12/27/17 0542)     Initial Impression / Assessment and Plan / ED Course  I have reviewed the triage vital signs and the nursing notes.  Pertinent labs & imaging results that were available during my care of the patient were reviewed by me and considered in my medical decision making (see chart for details).     The patient presents with severe back pain that is c/w back spasm. No neurologic deficits. No acute fall or injury. He did not take his usual medication last night which might have caused a flare, although he admits to being sore throughout the day yesterday.   He is medicated with his usual dose of Robaxin. He is able to stand and move to chair without severe pain but not back to his baseline. Norco provided with significant improvement.   He is examined by Dr. Roxanne Mins who feels he is appropriate for discharge home.  Family at bedside who is comfortable with discharge and taking him home by private vehicle.   Final Clinical Impressions(s) / ED Diagnoses   Final diagnoses:  Acute midline low back pain without sciatica  Muscle spasm of back    ED Discharge Orders        Ordered    HYDROcodone-acetaminophen (NORCO/VICODIN) 5-325 MG tablet  Every 4 hours PRN     12/27/17 0631       Charlann Lange, PA-C 37/62/83 1517    Delora Fuel, MD 61/60/73 [phone]

## 2017-12-27 NOTE — Discharge Instructions (Addendum)
Take your Robaxin as prescribed. Take Tylenol for pain, and Norco for severe pain. Follow up with Dr. Lynann Bologna for further management of chronic low back pain.

## 2017-12-28 IMAGING — CR DG CHEST 1V PORT
2 series · 2 of 2 positions shown · non-contrast
Comparison: 05/01/2016

CLINICAL DATA: C/o cough. Hx of sinus bradycardia, hypertension,
myocardial infarction(2005), right leg DVT. Stent placement (10
years ago.)

EXAM:
PORTABLE CHEST 1 VIEW

[AP (1 of 2)]
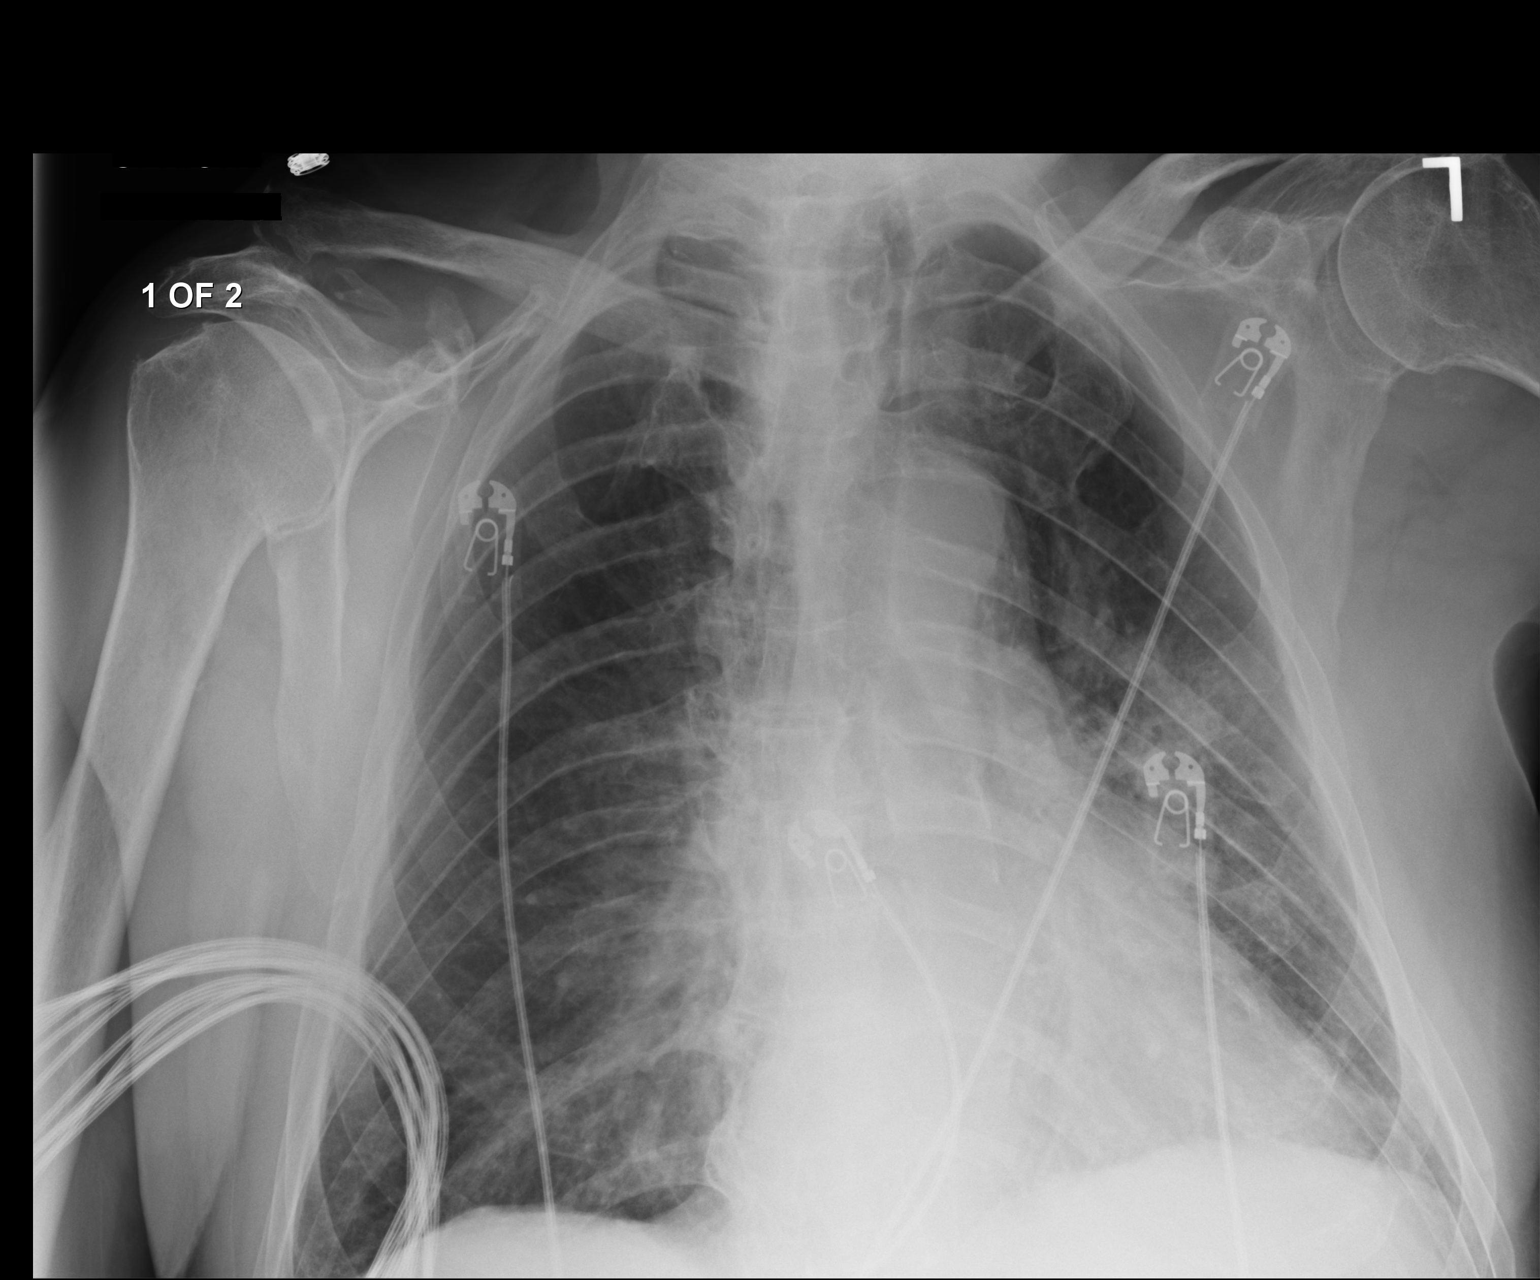

[AP (2 of 2)]
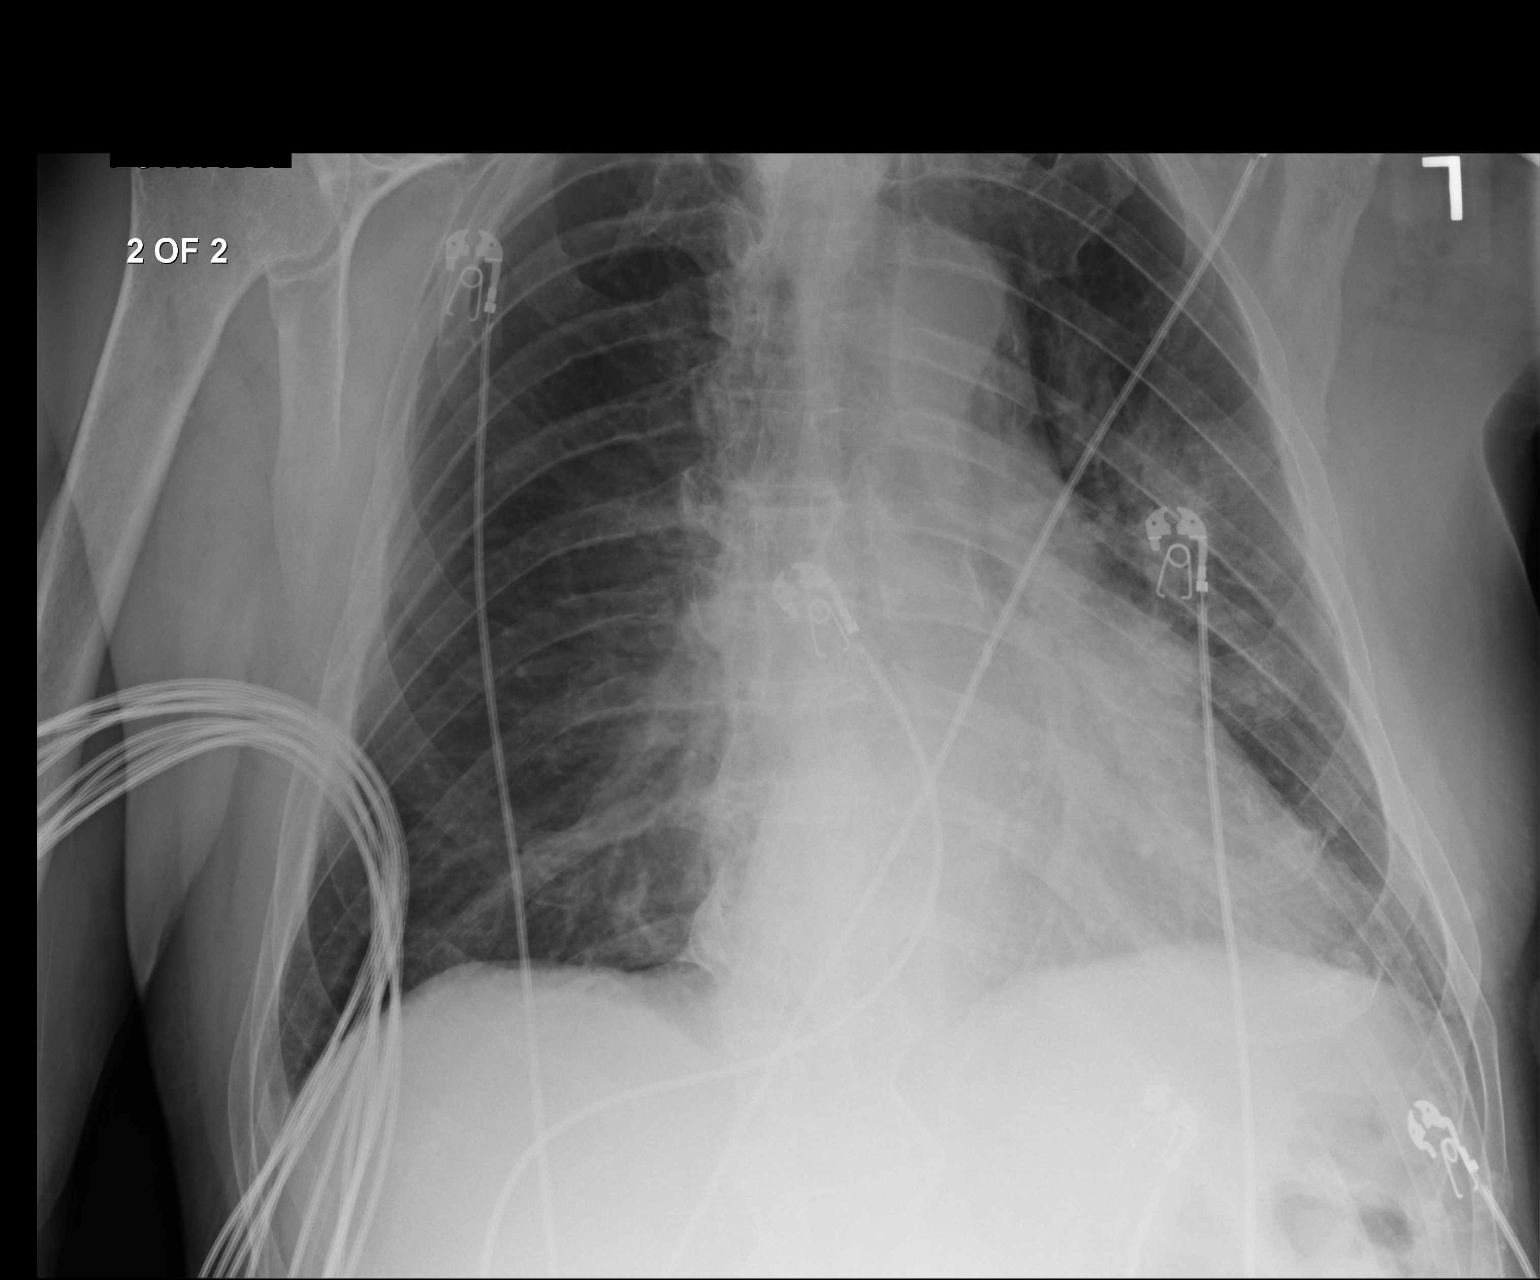

[2 of 2 positions shown; findings below may reference images not displayed]

FINDINGS: There is hazy left perihilar airspace opacity which is new from the
prior exam. Lungs are otherwise clear. Lungs are hyperexpanded.

No convincing pleural effusion.  No pneumothorax.

Cardiac silhouette is normal in size. No mediastinal or hilar
masses. No convincing adenopathy.
IMPRESSION: 1. Hazy left perihilar opacity consistent with a left upper lobe
pneumonia.
2. No other evidence of acute cardiopulmonary disease.

## 2018-01-09 ENCOUNTER — Telehealth: Payer: Self-pay | Admitting: Cardiovascular Disease

## 2018-01-09 NOTE — Telephone Encounter (Signed)
Agree. Thanks. chris

## 2018-01-09 NOTE — Telephone Encounter (Signed)
I spoke with pt's daughter.  Pt's weight is usually 181-182 lbs. Last Wednesday he was 181.2 lbs. Thursday was 183.2 lbs.  Friday he was 182.6 lbs.  Saturday,Sunday and today weight is 184 lbs. No shortness of breath.  Swelling in both lower legs. Left greater than right but pt has chronic left lower extremity edema. No shortness of breath.  In the past when pt has had weight gain lasix has been increased for a few days. I advised daughter to have pt take lasix and potassium twice daily for the next 4 days and have pt watch salt intake.  I asked her to call us back on Friday with an update. Pt has enough lasix/potassium at this time. I told her we could send a new prescription in on Friday if needed.

## 2018-01-09 NOTE — Telephone Encounter (Signed)
New message   Pt c/o swelling: STAT is pt has developed SOB within 24 hours  1) How much weight have you gained and in what time span? 4lbs in 1 week  2) If swelling, where is the swelling located? LEGS, LEFT is worse  3) Are you currently taking a fluid pill? YES  4) Are you currently SOB? NO  5) Do you have a log of your daily weights (if so, list)? 184.4 today  6) Have you gained 3 pounds in a day or 5 pounds in a week?   7) Have you traveled recently? NO

## 2018-01-13 MED ORDER — POTASSIUM CHLORIDE ER 10 MEQ PO TBCR: 10.0000 meq | EXTENDED_RELEASE_TABLET | Freq: Every day | ORAL | 6 refills | Status: DC

## 2018-01-13 NOTE — Telephone Encounter (Signed)
I spoke with pt's daughter. Weight has gradually decreased over last few days and today's weight is 179.2 lbs.  Swelling has improved.  Pt will return to normal lasix/potassium dosing today.  Daughter aware of pt's appt with Dr. Angelena Form on 8/07/13/17.  Pt needs refill on potassium.  Will send to pharmacy.

## 2018-02-08 ENCOUNTER — Telehealth: Payer: Self-pay | Admitting: Cardiovascular Disease

## 2018-02-08 NOTE — Telephone Encounter (Signed)
New Message       Pt c/o swelling: STAT is pt has developed SOB within 24 hours  1) How much weight have you gained and in what time span? 5 pds from Friday-Wednesday  2) If swelling, where is the swelling located? Left foot/leg  3) Are you currently taking a fluid pill? Yes  4) Are you currently SOB? No  5) Do you have a log of your daily weights (if so, list)?              07/26 178.0              07/27 179.0              07/28/ 180.0              07/29/ 182.4               07/30 182.8               07/31/ 184.4  6) Have you gained 3 pounds in a day or 5 pounds in a week? Yes  7) Have you traveled recently? No

## 2018-02-08 NOTE — Telephone Encounter (Signed)
Agree. Thanks. [name]

## 2018-02-08 NOTE — Telephone Encounter (Signed)
I spoke with pt's daughter regarding weights and swelling. In the past when pt has had weight gain he takes extra dose lasix and potassium for 3-4 days and this helps.  Pt is seeing Dr. Angelena Form on 8/2.  I instructed daughter to have pt take extra dose lasix and potassium today and tomorrow.  Daughter reports pt is having testing done on back tomorrow and was instructed to not take AM lasix tomorrow. They expect to be home around 2 PM.  I told pt's daughter to have pt take both AM and PM doses of lasix and potassium when he arrives home from procedure.

## 2018-02-09 NOTE — Progress Notes (Signed)
Chief Complaint  Patient presents with  . Follow-up    CAD     History of Present Illness: 84 yo male with history of CAD, HLD, atrial fib/flutter, DVT, prior CVA in 2018 who is here today for cardiac follow up. He is known to have CAD. Last cardiac cath in 2009. He is known to have atrial fib/flutter as well as prior DVT. He has been on Xarelto. He has not tolerated statins in the past. DVT in July 2015 and at that time was started on Xarelto. This was stopped in spring 2016.  He was seen in the ED in August and September 2016 for dizziness felt to be due to his muscle relaxer. Head CT and EKG were ok. Seen in ED October 2017 with swollen legs. Venous dopplers negative for DVT. Treated for cellulitis and given Lasix. EKG that day with atrial fib. He was restarted on Xarelto. [facility] with pneumonia, hemoptysis and CHF. He was diuresed with IV Lasix. HIs Xarelto was stopped due to hemoptysis and anemia and he then had a stroke in January 2018. His Xarelto was restarted. Echo January 2018 with normal LVEF=55-60%, moderate MR  He is here today for follow up. The patient denies any chest pain, dyspnea, palpitations, lower extremity edema, orthopnea, PND, dizziness, near syncope or syncope.    Primary Care Physician: Leeanne Rio, MD  Past Medical History:  Diagnosis Date  . Coronary atherosclerosis of native coronary artery    a. Anterior STEMI 2009 s/p BMSx2 to mid & prox LAD and angioplasty to distal LAD. total RCA with collaterals, moderate Cx plaquing. a. EF 55-60% in 2013.  [name] Elevated troponin   . Gross hematuria   . Gross hematuria 11/18/2008   Qualifier: Diagnosis of  By: Owens Shark, RN, BSN, [name]    . Hemoptysis 05/16/2016  . HTN (hypertension)   . Myocardial infarct ([place]) 03/21/2008  . NSVT (nonsustained ventricular tachycardia) ([place])    a. Per DC summary from time of STEMI 2009.  [name] Paroxysmal atrial flutter ([place])    a. Abnl EKG 01/2012 concerning  for atrial flutter, event monitor showed sinus bradycardia only and no pauses. Not felt to be a candidate for longterm anticoag due to hematuria.  . Phlebitis and thrombophlebitis of superficial vessels of lower extremities   . Pure hypercholesterolemia    a. Has not tolerated statins in the past.  . Right leg DVT ([place])    a. Dx 01/2014.  [name] Sinus bradycardia    a. By prior event monitor.  . Sinus bradycardia    a. By prior event monitor.   . Thrombocytopenia ([place])   . Urinary retention 06/20/2011    Past Surgical History:  Procedure Laterality Date  . CARPAL TUNNEL RELEASE    . CORONARY STENT PLACEMENT  2009  . REPLACEMENT TOTAL KNEE BILATERAL     Left 2014 (Dr. Eddie Dibbles); right 2012 (Dr. Redmond Pulling)  . trigger finger surgery      Current Outpatient Medications  Medication Sig Dispense Refill  . fluticasone (FLONASE) 50 MCG/ACT nasal spray Place 2 sprays into both nostrils daily as needed for allergies or rhinitis.    . furosemide (LASIX) 20 MG tablet Take 2 tablets (40 mg total) by mouth daily. 60 tablet 11  . guaiFENesin (MUCINEX) 600 MG 12 hr tablet Take by mouth 2 (two) times daily as needed for cough or to loosen phlegm.    [name] HYDROcodone-acetaminophen (NORCO/VICODIN) 5-325 MG tablet Take 1  tablet by mouth every 4 (four) hours as needed. 12 tablet 0  . loratadine (CLARITIN) 10 MG tablet Take 10 mg by mouth daily.    . methocarbamol (ROBAXIN) 500 MG tablet Take 500 mg by mouth every 8 (eight) hours as needed for muscle spasms.    . metoprolol tartrate (LOPRESSOR) 25 MG tablet TAKE 1/2 TABLET BY MOUTH 2 TIMES DAILY 30 tablet 11  . nitroGLYCERIN (NITROSTAT) 0.4 MG SL tablet Place 1 tablet (0.4 mg total) under the tongue every 5 (five) minutes as needed for chest pain. 25 tablet 3  . potassium chloride (K-DUR) 10 MEQ tablet Take 1 tablet (10 mEq total) by mouth daily. 30 tablet 6  . pravastatin (PRAVACHOL) 20 MG tablet TAKE 1 TABLET (20 MG TOTAL) BY MOUTH DAILY AT 6 PM. 90 tablet 1  .  pregabalin (LYRICA) 75 MG capsule Take 1 capsule (75 mg total) by mouth 2 (two) times daily. 60 capsule 0  . Red Yeast Rice Extract (RED YEAST RICE PO) Take 1,200 mg by mouth daily.    . Rivaroxaban (XARELTO) 15 MG TABS tablet Take 1 tablet (15 mg total) by mouth daily with supper. 30 tablet 6   No current facility-administered medications for this visit.     Allergies  Allergen Reactions  . Prednisone Other (See Comments)    Does not feel right  . Rosuvastatin Other (See Comments)    Aches    Social History   Socioeconomic History  . Marital status: Married    Spouse name: Not on file  . Number of children: Not on file  . Years of education: Not on file  . Highest education level: Not on file  Occupational History  . Not on file  Social Needs  . Financial resource strain: Not on file  . Food insecurity:    Worry: Not on file    Inability: Not on file  . Transportation needs:    Medical: Not on file    Non-medical: Not on file  Tobacco Use  . Smoking status: Never Smoker  . Smokeless tobacco: Never Used  Substance and Sexual Activity  . Alcohol use: No  . Drug use: No  . Sexual activity: Not on file  Lifestyle  . Physical activity:    Days per week: Not on file    Minutes per session: Not on file  . Stress: Not on file  Relationships  . Social connections:    Talks on phone: Not on file    Gets together: Not on file    Attends religious service: Not on file    Active member of club or organization: Not on file    Attends meetings of clubs or organizations: Not on file    Relationship status: Not on file  . Intimate partner violence:    Fear of current or ex partner: Not on file    Emotionally abused: Not on file    Physically abused: Not on file    Forced sexual activity: Not on file  Other Topics Concern  . Not on file  Social History Narrative   Lives in [place]. Wife has died. Lives next door to his daughter [name]. Also has a very supportive daughter,  [name] who lives out of town. 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 Denies tobacco, alcohol, or drug use ever in lifetime.    Family History  Problem Relation Age of Onset  . Cancer Sister   . Cancer Sister  Review of Systems:  As stated in the HPI and otherwise negative.   BP 110/70   Pulse 75   Ht 5\' 10"  (1.778 m)   Wt 179 lb 12.8 oz (81.6 kg)   SpO2 95%   BMI 25.80 kg/m   Physical Examination:  General: Well developed, well nourished, NAD  HEENT: OP clear, mucus membranes moist  SKIN: warm, dry. No rashes. Neuro: No focal deficits  Musculoskeletal: Muscle strength 5/5 all ext  Psychiatric: Mood and affect normal  Neck: No JVD, no carotid bruits, no thyromegaly, no lymphadenopathy.  Lungs:Clear bilaterally, no wheezes, rhonci, crackles Cardiovascular: Irreg irreg. No murmurs, gallops or rubs. Abdomen:Soft. Bowel sounds present. Non-tender.  Extremities: No lower extremity edema. Pulses are 2 + in the bilateral DP/PT.  Cardiac cath 03/21/08:  1. Ventriculography in the RAO projection reveals hypo-to-akinesis of  the anteroapical segment. The ejection fraction estimate will be  45%.  2. The left main is free of critical disease.  3. The LAD has a 60% narrowing at the septal. There is some mild  plaquing distal to this and then just after another septal, there  is a 90% narrowing, then total occlusion after a small diagonal.  The extent of disease extends into just beyond the next diagonal.  The proximal lesion was covered with a 2.5-mm stent with reduction  from 60%-0%. The mid LAD, which was the site of total occlusion  was reduced from 90% and 100% down to 0%. The distal lesion was  80%-90% in the distal vessel and reduced from 80%-90% to less than  20% residual narrowing with smooth angiographic result and good  runoff into the distal vessel.  4. The circumflex provides a large marginal branch with diffuse  segmental 60% narrowing followed by  a large marginal branch. There  was a tiny subbranch with 90% narrowing and an inferior subbranch  with perhaps 60% narrowing.  5. The right coronary artery is segmentally plaqued in the midvessel  and then totally occluded after an RV branch. This RV branch then  collateralizes the distal vessel. The distal vessel was supplied  by the LAD septals.  Echo  January 2018 Left ventricle: The cavity size was normal. There was mild   concentric hypertrophy. Systolic function was normal. The   estimated ejection fraction was in the range of 55% to 60%. Wall   motion was normal; there were no regional wall motion   abnormalities. - Mitral valve: There was mild to moderate regurgitation directed   eccentrically and posteriorly. - Left atrium: The atrium was mildly dilated. - Right atrium: The atrium was moderately to severely dilated. - Atrial septum: No defect or patent foramen ovale was identified. - Tricuspid valve: There was mild-moderate regurgitation directed   centrally. - Pulmonary arteries: Systolic pressure was mildly to moderately   increased. PA peak pressure: 49 mm Hg   EKG:  EKG is not ordered today. The ekg ordered today demonstrates    Recent Labs: 08/09/2017: ALT 17 08/25/2017: Hemoglobin 13.1; NT-Pro BNP 2,120; Platelets 129 09/23/2017: BUN 33; Creatinine, Ser 1.38; Potassium 3.8; Sodium 142   Lipid Panel     Component Value Date/Time   CHOL 117 08/09/2017 0840   TRIG 55 08/09/2017 0840   HDL 41 08/09/2017 0840   CHOLHDL 2.9 08/09/2017 0840   CHOLHDL 3.3 08/10/2016 0323   VLDL 9 08/10/2016 0323   LDLCALC 65 08/09/2017 0840      Wt Readings from Last 3 Encounters:  02/10/18 179 lb 12.8  oz (81.6 kg)  12/27/17 180 lb (81.6 kg)  10/04/17 180 lb 12.8 oz (82 kg)     Other studies Reviewed: Additional studies/ records that were reviewed today include: . Review of the above records demonstrates:    Assessment and Plan:   1. CAD without angina: He has no chest  pain suggestive of angina. He is not on ASA since he is on Xarelto. Will continue beta blocker and statin.      2. Atrial fibrillation, persistent: He is in atrial fib today. Rate is controlled on Lopressor. He is on Xarelto. No changes.   3. Hyperlipidemia: LDL at goal. Continue statin.   4. Chronic combined systolic and diastolic CHF: Volume status is ok. Weight is stable. Continue Lasix.   Current medicines are reviewed at length with the patient today.  The patient does not have concerns regarding medicines.  The following changes have been made:  no change  Labs/ tests ordered today include:   No orders of the defined types were placed in this encounter.   Disposition:   FU with my care team in 6 months.   Signed, Lauree Chandler, MD 02/10/2018 10:26 AM    [facility] [address], [place], [place]  [address] Phone: [phone]; Fax: [phone]

## 2018-02-10 ENCOUNTER — Encounter: Payer: Self-pay | Admitting: Cardiovascular Disease

## 2018-02-10 ENCOUNTER — Ambulatory Visit: Payer: Medicare Other | Admitting: Cardiovascular Disease

## 2018-02-10 VITALS — BP 110/70 | HR 75 | Ht 70.0 in | Wt 179.8 lb

## 2018-02-10 DIAGNOSIS — I5032 Chronic diastolic (congestive) heart failure: Secondary | ICD-10-CM | POA: Diagnosis not present

## 2018-02-10 DIAGNOSIS — I4819 Other persistent atrial fibrillation: Secondary | ICD-10-CM

## 2018-02-10 DIAGNOSIS — I2510 Atherosclerotic heart disease of native coronary artery without angina pectoris: Secondary | ICD-10-CM | POA: Diagnosis not present

## 2018-02-10 DIAGNOSIS — I481 Persistent atrial fibrillation: Secondary | ICD-10-CM

## 2018-02-10 DIAGNOSIS — E785 Hyperlipidemia, unspecified: Secondary | ICD-10-CM

## 2018-02-10 NOTE — Patient Instructions (Signed)
Medication Instructions:  Your physician recommends that you continue on your current medications as directed. Please refer to the Current Medication list given to you today.   Labwork: none  Testing/Procedures: none  Follow-Up: Your physician recommends that you schedule a follow-up appointment in: 6 months with Dr. Angelena Form or PA/NP on his care team.  Please call our office in about 2 months to schedule this appointment    Any Other Special Instructions Will Be Listed Below (If Applicable).     If you need a refill on your cardiac medications before your next appointment, please call your pharmacy.

## 2018-03-03 ENCOUNTER — Emergency Department (HOSPITAL_COMMUNITY): Payer: Medicare Other

## 2018-03-03 ENCOUNTER — Inpatient Hospital Stay (HOSPITAL_COMMUNITY)
Admission: EM | Admit: 2018-03-03 | Discharge: 2018-03-07 | DRG: 246 | Disposition: A | Discharge status: Home / Self Care | Payer: Medicare Other | Attending: Cardiovascular Disease | Admitting: Cardiovascular Disease

## 2018-03-03 ENCOUNTER — Other Ambulatory Visit: Payer: Self-pay

## 2018-03-03 ENCOUNTER — Encounter (HOSPITAL_COMMUNITY): Payer: Self-pay | Admitting: Emergency Medicine

## 2018-03-03 DIAGNOSIS — I252 Old myocardial infarction: Secondary | ICD-10-CM

## 2018-03-03 DIAGNOSIS — I340 Nonrheumatic mitral (valve) insufficiency: Secondary | ICD-10-CM | POA: Diagnosis not present

## 2018-03-03 DIAGNOSIS — E785 Hyperlipidemia, unspecified: Secondary | ICD-10-CM | POA: Diagnosis present

## 2018-03-03 DIAGNOSIS — N189 Chronic kidney disease, unspecified: Secondary | ICD-10-CM | POA: Diagnosis present

## 2018-03-03 DIAGNOSIS — Z955 Presence of coronary angioplasty implant and graft: Secondary | ICD-10-CM

## 2018-03-03 DIAGNOSIS — Z7901 Long term (current) use of anticoagulants: Secondary | ICD-10-CM | POA: Diagnosis not present

## 2018-03-03 DIAGNOSIS — I4819 Other persistent atrial fibrillation: Secondary | ICD-10-CM | POA: Diagnosis present

## 2018-03-03 DIAGNOSIS — I5043 Acute on chronic combined systolic (congestive) and diastolic (congestive) heart failure: Secondary | ICD-10-CM | POA: Diagnosis not present

## 2018-03-03 DIAGNOSIS — D72829 Elevated white blood cell count, unspecified: Secondary | ICD-10-CM | POA: Diagnosis present

## 2018-03-03 DIAGNOSIS — Z96653 Presence of artificial knee joint, bilateral: Secondary | ICD-10-CM | POA: Diagnosis present

## 2018-03-03 DIAGNOSIS — I481 Persistent atrial fibrillation: Secondary | ICD-10-CM | POA: Diagnosis present

## 2018-03-03 DIAGNOSIS — I214 Non-ST elevation (NSTEMI) myocardial infarction: Secondary | ICD-10-CM | POA: Diagnosis present

## 2018-03-03 DIAGNOSIS — Z8673 Personal history of transient ischemic attack (TIA), and cerebral infarction without residual deficits: Secondary | ICD-10-CM

## 2018-03-03 DIAGNOSIS — Z86718 Personal history of other venous thrombosis and embolism: Secondary | ICD-10-CM

## 2018-03-03 DIAGNOSIS — I4510 Unspecified right bundle-branch block: Secondary | ICD-10-CM | POA: Diagnosis present

## 2018-03-03 DIAGNOSIS — Z79899 Other long term (current) drug therapy: Secondary | ICD-10-CM

## 2018-03-03 DIAGNOSIS — I482 Chronic atrial fibrillation: Secondary | ICD-10-CM | POA: Diagnosis not present

## 2018-03-03 DIAGNOSIS — I5023 Acute on chronic systolic (congestive) heart failure: Secondary | ICD-10-CM | POA: Diagnosis present

## 2018-03-03 DIAGNOSIS — E7800 Pure hypercholesterolemia, unspecified: Secondary | ICD-10-CM | POA: Diagnosis present

## 2018-03-03 DIAGNOSIS — I2510 Atherosclerotic heart disease of native coronary artery without angina pectoris: Secondary | ICD-10-CM | POA: Diagnosis present

## 2018-03-03 DIAGNOSIS — R0602 Shortness of breath: Secondary | ICD-10-CM | POA: Diagnosis present

## 2018-03-03 DIAGNOSIS — R001 Bradycardia, unspecified: Secondary | ICD-10-CM | POA: Diagnosis present

## 2018-03-03 DIAGNOSIS — Z888 Allergy status to other drugs, medicaments and biological substances status: Secondary | ICD-10-CM | POA: Diagnosis not present

## 2018-03-03 DIAGNOSIS — I10 Essential (primary) hypertension: Secondary | ICD-10-CM | POA: Diagnosis not present

## 2018-03-03 DIAGNOSIS — I110 Hypertensive heart disease with heart failure: Secondary | ICD-10-CM | POA: Diagnosis present

## 2018-03-03 DIAGNOSIS — E782 Mixed hyperlipidemia: Secondary | ICD-10-CM | POA: Diagnosis not present

## 2018-03-03 HISTORY — DX: Paroxysmal atrial fibrillation: I48.0

## 2018-03-03 LAB — CBG MONITORING, ED: GLUCOSE-CAPILLARY: 155 mg/dL — AB (ref 70–99)

## 2018-03-03 LAB — CBC WITH DIFFERENTIAL/PLATELET
Abs Immature Granulocytes: 0.1 10*3/uL (ref 0.0–0.1)
Basophils Absolute: 0 10*3/uL (ref 0.0–0.1)
Basophils Relative: 0 %
EOS ABS: 0 10*3/uL (ref 0.0–0.7)
Eosinophils Relative: 0 %
HEMATOCRIT: 44.2 % (ref 39.0–52.0)
Hemoglobin: 14.3 g/dL (ref 13.0–17.0)
Immature Granulocytes: 1 %
LYMPHS ABS: 0.6 10*3/uL — AB (ref 0.7–4.0)
Lymphocytes Relative: 6 %
MCH: 28.7 pg (ref 26.0–34.0)
MCHC: 32.4 g/dL (ref 30.0–36.0)
MCV: 88.8 fL (ref 78.0–100.0)
MONO ABS: 0.5 10*3/uL (ref 0.1–1.0)
Monocytes Relative: 4 %
Neutro Abs: 9.8 10*3/uL — ABNORMAL HIGH (ref 1.7–7.7)
Neutrophils Relative %: 89 %
Platelets: 107 10*3/uL — ABNORMAL LOW (ref 150–400)
RBC: 4.98 MIL/uL (ref 4.22–5.81)
RDW: 13.8 % (ref 11.5–15.5)
WBC: 10.9 10*3/uL — AB (ref 4.0–10.5)

## 2018-03-03 LAB — BASIC METABOLIC PANEL
Anion gap: 10 (ref 5–15)
BUN: 28 mg/dL — AB (ref 8–23)
CHLORIDE: 102 mmol/L (ref 98–111)
CO2: 27 mmol/L (ref 22–32)
CREATININE: 1.33 mg/dL — AB (ref 0.61–1.24)
Calcium: 9.8 mg/dL (ref 8.9–10.3)
GFR calc Af Amer: 52 mL/min — ABNORMAL LOW (ref >60)
GFR calc non Af Amer: 45 mL/min — ABNORMAL LOW (ref >60)
Glucose, Bld: 218 mg/dL — ABNORMAL HIGH (ref 70–99)
Potassium: 3.9 mmol/L (ref 3.5–5.1)
SODIUM: 139 mmol/L (ref 135–145)

## 2018-03-03 LAB — I-STAT TROPONIN, ED
Troponin i, poc: 0.03 ng/mL (ref 0.00–0.08)
Troponin i, poc: 1.48 ng/mL (ref 0.00–0.08)

## 2018-03-03 LAB — CBC
HEMATOCRIT: 43.4 % (ref 39.0–52.0)
HEMOGLOBIN: 13.7 g/dL (ref 13.0–17.0)
MCH: 28 pg (ref 26.0–34.0)
MCHC: 31.6 g/dL (ref 30.0–36.0)
MCV: 88.8 fL (ref 78.0–100.0)
Platelets: 105 10*3/uL — ABNORMAL LOW (ref 150–400)
RBC: 4.89 MIL/uL (ref 4.22–5.81)
RDW: 13.8 % (ref 11.5–15.5)
WBC: 10.5 10*3/uL (ref 4.0–10.5)

## 2018-03-03 MED ORDER — ASPIRIN 81 MG PO CHEW
324.0000 mg | CHEWABLE_TABLET | Freq: Once | ORAL | Status: AC
  Administered 2018-03-03: 324 mg via ORAL

## 2018-03-03 MED ORDER — HEPARIN (PORCINE) IN NACL 100-0.45 UNIT/ML-% IJ SOLN
1000.0000 [IU]/h | INTRAMUSCULAR | Status: DC
  Administered 2018-03-03: 1000 [IU]/h via INTRAVENOUS
  Filled 2018-03-03: qty 250

## 2018-03-03 MED ORDER — SODIUM CHLORIDE 0.9 % IV SOLN
INTRAVENOUS | Status: DC
  Administered 2018-03-03: 22:00:00 via INTRAVENOUS

## 2018-03-03 MED ORDER — ASPIRIN 81 MG PO CHEW
324.0000 mg | CHEWABLE_TABLET | Freq: Once | ORAL | Status: DC
  Filled 2018-03-03: qty 4

## 2018-03-03 MED ORDER — ACETAMINOPHEN 325 MG PO TABS: 650.0000 mg | ORAL_TABLET | Freq: Four times a day (QID) | ORAL | Status: DC | PRN

## 2018-03-03 MED ORDER — HEPARIN SODIUM (PORCINE) 5000 UNIT/ML IJ SOLN
4000.0000 [IU] | Freq: Once | INTRAMUSCULAR | Status: AC
Start: 2018-03-03 — End: 2018-03-03
  Administered 2018-03-03: 4000 [IU] via INTRAVENOUS
  Filled 2018-03-03: qty 1

## 2018-03-03 NOTE — ED Notes (Signed)
Pt denies any cp at this time

## 2018-03-03 NOTE — ED Triage Notes (Signed)
Pt reports sob that started around 1500. EMS arrived and did an EKG an stated they recommended coming here, family refused transport. Pt denies pain and any other sx. Respirations regular, unlabored and lungs clear in triage. Hx of Afib and takes Xarelto, which he started back up last night which was stopped for recent back surgery. Hx of HTN and Mis with stents.

## 2018-03-03 NOTE — H&P (Signed)
Cardiology Admission History and Physical:   Patient ID: [name]; MRN: [id number]; DOB: 28-May-1924   Admission date: 03/03/2018  Primary Care Provider: Leeanne Rio, MD Primary Cardiologist: Burnell Blanks, MD  Chief Complaint:  Dyspnea, elevated troponin   Patient Profile:   [name] is a 84 y.o. male with a history of CAD, HFrEF with normalized EF, HLD, atrial fib/flutter, DVT, prior CVA in 2018 who presents with suspected NSTEMI.   History of Present Illness:   [name] is a 84 y.o. male with a history of CAD,H FrEF with normalized EF,  HLD, atrial fib/flutter, DVT, prior CVA in 2018 who presents with suspected NSTEMI.   The patient has a history of multiple cardiac comorbidities and follows with Dr. Angelena Form, who last saw the patient in clinic 3 weeks ago. At that visit, he was doing well without any cardiac symptoms.   He underwent a spinal nerve ablation procedure yesterday with othopedic surgery. He reportedly tolerated the procedure without issue. He was restarted on his xarelto last night.  He presents to the ED today with his daughter (who contributes to history) following an episode of dyspnea. He reports that he was doing well today and then had an episode of dyspnea while at rest. It resolved spontaneously. He attributed it to eating a large lunch, as he also had some acid reflux. He denied any associated chest pain or LE edema or other symptoms. He states that he has chronic, mild DOE that is at his baseline and generally worsens when he carries too much fluid.   In the ED, SBP 130-140s and HR 60-80s. ECG showed rate controlled AF with RBBB and no acute ischemic changes. WBC 10.9, Cr 1.33 (baseline). Initial troponin negative, but subsequent measurement 1.48. CXR showed possible R basilar atelectasis vs. infiltrate. He was started on heparin gtt. Orthopedics called by ED and stated that heparin is safe at this time.     Past Medical History:  Diagnosis Date  . Coronary atherosclerosis of native coronary artery    a. Anterior STEMI 2009 s/p BMSx2 to mid & prox LAD and angioplasty to distal LAD. total RCA with collaterals, moderate Cx plaquing. a. EF 55-60% in 2013.  [name] Elevated troponin   . Gross hematuria   . Gross hematuria 11/18/2008   Qualifier: Diagnosis of  By: Owens Shark, RN, BSN, [name]    . Hemoptysis 05/16/2016  . HTN (hypertension)   . Myocardial infarct ([place]) 03/21/2008  . NSVT (nonsustained ventricular tachycardia) ([place])    a. Per DC summary from time of STEMI 2009.  [name] Paroxysmal atrial flutter ([place])    a. Abnl EKG 01/2012 concerning for atrial flutter, event monitor showed sinus bradycardia only and no pauses. Not felt to be a candidate for longterm anticoag due to hematuria.  . Phlebitis and thrombophlebitis of superficial vessels of lower extremities   . Pure hypercholesterolemia    a. Has not tolerated statins in the past.  . Right leg DVT ([place])    a. Dx 01/2014.  [name] Sinus bradycardia    a. By prior event monitor.  . Sinus bradycardia    a. By prior event monitor.   . Thrombocytopenia ([place])   . Urinary retention 06/20/2011    Past Surgical History:  Procedure Laterality Date  . CARPAL TUNNEL RELEASE    . CORONARY STENT PLACEMENT  2009  . REPLACEMENT TOTAL KNEE BILATERAL     Left 2014 (Dr. Eddie Dibbles); right 2012 (Dr. Redmond Pulling)  .  trigger finger surgery       Medications Prior to Admission: Prior to Admission medications   Medication Sig Start Date End Date Taking? Authorizing Provider  fluticasone (FLONASE) 50 MCG/ACT nasal spray Place 2 sprays into both nostrils daily as needed for allergies or rhinitis.   Yes [provider]  furosemide (LASIX) 20 MG tablet Take 2 tablets (40 mg total) by mouth daily. 08/25/17 12/27/25 Yes Daune Perch, NP  guaiFENesin (MUCINEX) 600 MG 12 hr tablet Take by mouth 2 (two) times daily as needed for cough or to loosen phlegm.   Yes [provider]  HYDROcodone-acetaminophen (NORCO/VICODIN) 5-325 MG tablet Take 1 tablet by mouth every 4 (four) hours as needed. Patient taking differently: Take 1 tablet by mouth every 4 (four) hours as needed for moderate pain.  12/27/17  Yes Upstill, Nehemiah Settle, PA-C  loratadine (CLARITIN) 10 MG tablet Take 10 mg by mouth daily.   Yes [provider]  methocarbamol (ROBAXIN) 500 MG tablet Take 500 mg by mouth every 8 (eight) hours as needed for muscle spasms.   Yes [provider]  metoprolol tartrate (LOPRESSOR) 25 MG tablet TAKE 1/2 TABLET BY MOUTH 2 TIMES DAILY Patient taking differently: Take 12.5 mg by mouth 2 (two) times daily.  05/17/17  Yes Burnell Blanks, MD  nitroGLYCERIN (NITROSTAT) 0.4 MG SL tablet Place 1 tablet (0.4 mg total) under the tongue every 5 (five) minutes as needed for chest pain. 10/04/17  Yes Daune Perch, NP  potassium chloride (K-DUR) 10 MEQ tablet Take 1 tablet (10 mEq total) by mouth daily. 01/13/18  Yes Burnell Blanks, MD  pravastatin (PRAVACHOL) 20 MG tablet TAKE 1 TABLET (20 MG TOTAL) BY MOUTH DAILY AT 6 PM. 12/20/17  Yes Leeanne Rio, MD  pregabalin (LYRICA) 75 MG capsule Take 1 capsule (75 mg total) by mouth 2 (two) times daily. 05/18/16  Yes Asencion Partridge, MD  Red Yeast Rice Extract (RED YEAST RICE PO) Take 1,200 mg by mouth daily.   Yes [provider]  Rivaroxaban (XARELTO) 15 MG TABS tablet Take 1 tablet (15 mg total) by mouth daily with supper. 08/24/17  Yes Burnell Blanks, MD     Allergies:    Allergies  Allergen Reactions  . Prednisone Other (See Comments)    Does not feel right  . Rosuvastatin Other (See Comments)    Aches    Social History:   Social History   Socioeconomic History  . Marital status: Married    Spouse name: Not on file  . Number of children: Not on file  . Years of education: Not on file  . Highest education level: Not on file  Occupational History  . Not on file   Social Needs  . Financial resource strain: Not on file  . Food insecurity:    Worry: Not on file    Inability: Not on file  . Transportation needs:    Medical: Not on file    Non-medical: Not on file  Tobacco Use  . Smoking status: Never Smoker  . Smokeless tobacco: Never Used  Substance and Sexual Activity  . Alcohol use: No  . Drug use: No  . Sexual activity: Not on file  Lifestyle  . Physical activity:    Days per week: Not on file    Minutes per session: Not on file  . Stress: Not on file  Relationships  . Social connections:    Talks on phone: Not on file  Gets together: Not on file    Attends religious service: Not on file    Active member of club or organization: Not on file    Attends meetings of clubs or organizations: Not on file    Relationship status: Not on file  . Intimate partner violence:    Fear of current or ex partner: Not on file    Emotionally abused: Not on file    Physically abused: Not on file    Forced sexual activity: Not on file  Other Topics Concern  . Not on file  Social History Narrative   Lives in [place]. Wife has died. Lives next door to his daughter [name]. Also has a very supportive daughter, [name] who lives out of town. 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 Denies tobacco, alcohol, or drug use ever in lifetime.    Family History:   The patient's family history includes Cancer in his sister and sister.    ROS:  Please see the history of present illness.  All other ROS reviewed and negative.     Physical Exam/Data:   Vitals:   03/03/18 2015 03/03/18 2045 03/03/18 2130 03/03/18 2324  BP: (!) 148/96 (!) 149/91 (!) 141/91 (!) 145/88  Pulse: 76 77 65 77  Resp: 10 (!) 21 (!) 24 18  Temp:      TempSrc:      SpO2: 96% 97% 94% 97%  Weight:      Height:       No intake or output data in the 24 hours ending 03/03/18 2359 Filed Weights   03/03/18 1634  Weight: 79.8 kg   Body mass index is 25.25 kg/m.   General:  Well nourished, well developed, in no acute distress  HEENT: normal Lymph: no adenopathy Neck: JVD at mid-neck at 45 degrees Cardiac:  normal S1, S2; RRR; no murmur  Lungs:  clear to auscultation bilaterally, no wheezing, rhonchi or rales  Abd: soft, nontender, no hepatomegaly  Ext: no significant LE edema Musculoskeletal:  No deformities, BUE and BLE strength normal and equal Skin: warm and dry  Neuro:  No focal abnormalities noted Psych:  Normal affect    EKG:  The ECG that was done was personally reviewed and demonstrates rate controlled AF with RBBB and no acute ischemic changes  Relevant CV Studies: Cardiac cath 03/21/08:  1. Ventriculography in the RAO projection reveals hypo-to-akinesis of  the anteroapical segment. The ejection fraction estimate will be  45%.  2. The left main is free of critical disease.  3. The LAD has a 60% narrowing at the septal. There is some mild  plaquing distal to this and then just after another septal, there  is a 90% narrowing, then total occlusion after a small diagonal.  The extent of disease extends into just beyond the next diagonal.  The proximal lesion was covered with a 2.5-mm stent with reduction  from 60%-0%. The mid LAD, which was the site of total occlusion  was reduced from 90% and 100% down to 0%. The distal lesion was  80%-90% in the distal vessel and reduced from 80%-90% to less than  20% residual narrowing with smooth angiographic result and good  runoff into the distal vessel.  4. The circumflex provides a large marginal branch with diffuse  segmental 60% narrowing followed by a large marginal branch. There  was a tiny subbranch with 90% narrowing and an inferior subbranch  with perhaps 60% narrowing.  5. The right coronary artery  is segmentally plaqued in the midvessel  and then totally occluded after an RV branch. This RV branch then  collateralizes the distal vessel. The distal vessel was supplied  by the LAD  septals.  Echo January 2018 Left ventricle: The cavity size was normal. There was mild concentric hypertrophy. Systolic function was normal. The estimated ejection fraction was in the range of 55% to 60%. Wall motion was normal; there were no regional wall motion abnormalities. - Mitral valve: There was mild to moderate regurgitation directed eccentrically and posteriorly. - Left atrium: The atrium was mildly dilated. - Right atrium: The atrium was moderately to severely dilated. - Atrial septum: No defect or patent foramen ovale was identified. - Tricuspid valve: There was mild-moderate regurgitation directed centrally. - Pulmonary arteries: Systolic pressure was mildly to moderately increased. PA peak pressure: 49 mm Hg   Laboratory Data:  Chemistry Recent Labs  Lab 03/03/18 1642  NA 139  K 3.9  CL 102  CO2 27  GLUCOSE 218*  BUN 28*  CREATININE 1.33*  CALCIUM 9.8  GFRNONAA 45*  GFRAA 52*  ANIONGAP 10    No results for input(s): PROT, ALBUMIN, AST, ALT, ALKPHOS, BILITOT in the last 168 hours. Hematology Recent Labs  Lab 03/03/18 1642 03/03/18 2235  WBC 10.9* 10.5  RBC 4.98 4.89  HGB 14.3 13.7  HCT 44.2 43.4  MCV 88.8 88.8  MCH 28.7 28.0  MCHC 32.4 31.6  RDW 13.8 13.8  PLT 107* 105*   Cardiac EnzymesNo results for input(s): TROPONINI in the last 168 hours.  Recent Labs  Lab 03/03/18 1649 03/03/18 2124  TROPIPOC 0.03 1.48*    BNPNo results for input(s): BNP, PROBNP in the last 168 hours.  DDimer No results for input(s): DDIMER in the last 168 hours.  Radiology/Studies:  Dg Chest 2 View  Result Date: 03/03/2018 CLINICAL DATA:  Shortness of breath since 1500 hours, history coronary disease post MI, hypertension EXAM: CHEST - 2 VIEW COMPARISON:  09/13/2017 FINDINGS: Enlargement of cardiac silhouette. Mediastinal contours and pulmonary vascularity normal. Atherosclerotic calcifications aorta. RIGHT basilar atelectasis versus infiltrate.  Remaining lungs clear. No pleural effusion or pneumothorax. Bones unremarkable. IMPRESSION: Enlargement of cardiac silhouette. RIGHT basilar atelectasis versus infiltrate. Electronically Signed   By: Lavonia Dana M.D.   On: 03/03/2018 18:06    Assessment and Plan:   Dyspnea Elevated troponin History of CAD The patient has a history of CAD without recent angina. He presents with an episode of nonspecific dyspnea and GI symptoms that resolved spontaneously. ECG non-acute and initial troponin is negative, but second troponin measurement is elevated. His clinical presentation therefore raises concern for possible NSTEMI. Notably, he had an invasive procedure yesterday that could cause troponin elevation, although it seems unlikely to be the insult given initial negative troponin this afternoon with significant rise on subsequent measurement. He is currently chest pain free. At this time, will plan for medical management with ACS with plan for ischemic evaluation based on clinical trajectory. -Heparin gtt started in ED. -s/p ASA 325. Continue 81 mg daily  -Continue home statin -Continue home metoprolol  -HgA1c, lipid panel ordered  -Echocardiogram ordered -NPO at midnight for possible LHC.  Leukocytosis ?CXR opacity WBC borderline-elevated and CXR shows possible R basilar opacity. PNA (maybe related to recent procedure) is on ddx for dyspnea and clinical findings, although this seems less likely at the time. He denies fever or infectious symptoms. -Continue to monitor   HFrEF with normalized EF The patient has a history of mildly  reduced EF with normal EF on most recent echocardiogram. He and his daughter state that his DOE is stable (with the exception of the episode today) and that his weight is stable. He has no significant edema.  -Continue home metoprolol  -Continue home oral lasix. Can give IV dosing as needed.   Atrial fibrillation, persistent: Currently rate controlled -Continue home  metoprolol -Holding DOAC while on heparin gtt   Hyperlipidemia:  -Continue statin.   CKD -Renal function at baseline  Severity of Illness: The appropriate patient status for this patient is INPATIENT. Inpatient status is judged to be reasonable and necessary in order to provide the required intensity of service to ensure the patient's safety. The patient's presenting symptoms, physical exam findings, and initial radiographic and laboratory data in the context of their chronic comorbidities is felt to place them at high risk for further clinical deterioration. Furthermore, it is not anticipated that the patient will be medically stable for discharge from the hospital within 2 midnights of admission. The following factors support the patient status of inpatient.   " The patient's presenting symptoms include dyspnea. " The worrisome physical exam findings include n/a. " The initial radiographic and laboratory data are worrisome because of elevated troponin. " The chronic co-morbidities include CAD.   * I certify that at the point of admission it is my clinical judgment that the patient will require inpatient hospital care spanning beyond 2 midnights from the point of admission due to high intensity of service, high risk for further deterioration and high frequency of surveillance required.*    For questions or updates, please contact [place] Please consult [email or web address] for contact info under Cardiology/STEMI.    Signed, Nila Nephew, MD  03/03/2018 11:59 PM

## 2018-03-03 NOTE — ED Notes (Signed)
Dr Rogene Houston informed of troponin results 1.48

## 2018-03-03 NOTE — Progress Notes (Signed)
ANTICOAGULATION CONSULT NOTE - Initial Consult  Pharmacy Consult for heparin Indication: chest pain/ACS  Allergies  Allergen Reactions  . Prednisone Other (See Comments)    Does not feel right  . Rosuvastatin Other (See Comments)    Aches    Patient Measurements: Height: 5\' 10"  (177.8 cm) Weight: 176 lb (79.8 kg) IBW/kg (Calculated) : 73 Heparin Dosing Weight: 79 Kg  Vital Signs: Temp: 97.9 F (36.6 C) (08/23 1836) Temp Source: Oral (08/23 1836) BP: 141/91 (08/23 2130) Pulse Rate: 65 (08/23 2130)  Labs: Recent Labs    03/03/18 1642  HGB 14.3  HCT 44.2  PLT 107*  CREATININE 1.33*    Estimated Creatinine Clearance: 37.4 mL/min (A) (by C-G formula based on SCr of 1.33 mg/dL (H)).   Medical History: Past Medical History:  Diagnosis Date  . Coronary atherosclerosis of native coronary artery    a. Anterior STEMI 2009 s/p BMSx2 to mid & prox LAD and angioplasty to distal LAD. total RCA with collaterals, moderate Cx plaquing. a. EF 55-60% in 2013.  [name] Elevated troponin   . Gross hematuria   . Gross hematuria 11/18/2008   Qualifier: Diagnosis of  By: Owens Shark, RN, BSN, [name]    . Hemoptysis 05/16/2016  . HTN (hypertension)   . Myocardial infarct ([place]) 03/21/2008  . NSVT (nonsustained ventricular tachycardia) ([place])    a. Per DC summary from time of STEMI 2009.  [name] Paroxysmal atrial flutter ([place])    a. Abnl EKG 01/2012 concerning for atrial flutter, event monitor showed sinus bradycardia only and no pauses. Not felt to be a candidate for longterm anticoag due to hematuria.  . Phlebitis and thrombophlebitis of superficial vessels of lower extremities   . Pure hypercholesterolemia    a. Has not tolerated statins in the past.  . Right leg DVT ([place])    a. Dx 01/2014.  [name] Sinus bradycardia    a. By prior event monitor.  . Sinus bradycardia    a. By prior event monitor.   . Thrombocytopenia ([place])   . Urinary retention 06/20/2011     Assessment: 84 yo on xarelto pta for  history of afib presents with chest pain. Last dose PTA 8/23. Troponin 1.48, EDP ordered heparin with bolus. Bolus given prior to pharmacy consult being placed. Hgb 14.3, PLT 107    Goal of Therapy:  Heparin level 0.3-0.7 units/ml Monitor platelets by anticoagulation protocol: Yes   Plan:  Give 4000 units bolus x 1 Start heparin infusion at 1000 units/hr Check anti-Xa/aPTT level in 8 hours and daily while on heparin Continue to monitor H&H and platelets  Billy Turvey L Boy Delamater 03/03/2018,10:59 PM

## 2018-03-04 ENCOUNTER — Other Ambulatory Visit: Payer: Self-pay

## 2018-03-04 ENCOUNTER — Inpatient Hospital Stay (HOSPITAL_COMMUNITY): Payer: Medicare Other

## 2018-03-04 DIAGNOSIS — I4819 Other persistent atrial fibrillation: Secondary | ICD-10-CM | POA: Diagnosis present

## 2018-03-04 DIAGNOSIS — D72829 Elevated white blood cell count, unspecified: Secondary | ICD-10-CM | POA: Insufficient documentation

## 2018-03-04 DIAGNOSIS — I482 Chronic atrial fibrillation: Secondary | ICD-10-CM

## 2018-03-04 DIAGNOSIS — I340 Nonrheumatic mitral (valve) insufficiency: Secondary | ICD-10-CM

## 2018-03-04 LAB — TROPONIN I
Troponin I: 4.86 ng/mL (ref <0.03)
Troponin I: 14.35 ng/mL (ref <0.03)
Troponin I: 40.85 ng/mL (ref <0.03)

## 2018-03-04 LAB — LIPID PANEL
CHOL/HDL RATIO: 3.2 ratio
CHOLESTEROL: 104 mg/dL (ref 0–200)
HDL: 33 mg/dL — ABNORMAL LOW (ref >40)
LDL Cholesterol: 67 mg/dL (ref 0–99)
TRIGLYCERIDES: 22 mg/dL (ref <150)
VLDL: 4 mg/dL (ref 0–40)

## 2018-03-04 LAB — CBC
HCT: 41.8 % (ref 39.0–52.0)
HEMOGLOBIN: 13.5 g/dL (ref 13.0–17.0)
MCH: 28.1 pg (ref 26.0–34.0)
MCHC: 32.3 g/dL (ref 30.0–36.0)
MCV: 86.9 fL (ref 78.0–100.0)
Platelets: 107 10*3/uL — ABNORMAL LOW (ref 150–400)
RBC: 4.81 MIL/uL (ref 4.22–5.81)
RDW: 13.9 % (ref 11.5–15.5)
WBC: 10.7 10*3/uL — AB (ref 4.0–10.5)

## 2018-03-04 LAB — BASIC METABOLIC PANEL
Anion gap: 9 (ref 5–15)
BUN: 32 mg/dL — ABNORMAL HIGH (ref 8–23)
CHLORIDE: 104 mmol/L (ref 98–111)
CO2: 25 mmol/L (ref 22–32)
Calcium: 9.2 mg/dL (ref 8.9–10.3)
Creatinine, Ser: 1.27 mg/dL — ABNORMAL HIGH (ref 0.61–1.24)
GFR calc non Af Amer: 48 mL/min — ABNORMAL LOW (ref >60)
GFR, EST AFRICAN AMERICAN: 55 mL/min — AB (ref >60)
Glucose, Bld: 147 mg/dL — ABNORMAL HIGH (ref 70–99)
Potassium: 3.9 mmol/L (ref 3.5–5.1)
SODIUM: 138 mmol/L (ref 135–145)

## 2018-03-04 LAB — HEMOGLOBIN A1C
Hgb A1c MFr Bld: 6 % — ABNORMAL HIGH (ref 4.8–5.6)
Mean Plasma Glucose: 125.5 mg/dL

## 2018-03-04 LAB — ECHOCARDIOGRAM COMPLETE
Height: 70 in
Weight: 2754.87 oz

## 2018-03-04 LAB — HEPARIN LEVEL (UNFRACTIONATED)

## 2018-03-04 LAB — APTT: APTT: 81 s — AB (ref 24–36)

## 2018-03-04 LAB — BRAIN NATRIURETIC PEPTIDE: B NATRIURETIC PEPTIDE 5: 526.6 pg/mL — AB (ref 0.0–100.0)

## 2018-03-04 MED ORDER — FUROSEMIDE 40 MG PO TABS
40.0000 mg | ORAL_TABLET | Freq: Every day | ORAL | Status: DC
  Administered 2018-03-04 – 2018-03-07 (×4): 40 mg via ORAL
  Filled 2018-03-04 (×4): qty 1

## 2018-03-04 MED ORDER — ASPIRIN EC 81 MG PO TBEC
81.0000 mg | DELAYED_RELEASE_TABLET | Freq: Every day | ORAL | Status: DC
  Administered 2018-03-04 – 2018-03-05 (×2): 81 mg via ORAL
  Filled 2018-03-04 (×2): qty 1

## 2018-03-04 MED ORDER — PRAVASTATIN SODIUM 40 MG PO TABS
20.0000 mg | ORAL_TABLET | Freq: Every day | ORAL | Status: DC
  Administered 2018-03-04 – 2018-03-05 (×2): 20 mg via ORAL
  Filled 2018-03-04 (×2): qty 1

## 2018-03-04 MED ORDER — NITROGLYCERIN 0.4 MG SL SUBL: 0.4000 mg | SUBLINGUAL_TABLET | SUBLINGUAL | Status: DC | PRN

## 2018-03-04 MED ORDER — CYCLOSPORINE 0.05 % OP EMUL
1.0000 [drp] | Freq: Two times a day (BID) | OPHTHALMIC | Status: DC
  Administered 2018-03-04 – 2018-03-06 (×6): 1 [drp] via OPHTHALMIC
  Filled 2018-03-04 (×7): qty 1

## 2018-03-04 MED ORDER — PREGABALIN 25 MG PO CAPS
75.0000 mg | ORAL_CAPSULE | Freq: Two times a day (BID) | ORAL | Status: DC
  Administered 2018-03-04 – 2018-03-07 (×7): 75 mg via ORAL
  Filled 2018-03-04: qty 3
  Filled 2018-03-04: qty 1
  Filled 2018-03-04 (×2): qty 3
  Filled 2018-03-04 (×4): qty 1

## 2018-03-04 MED ORDER — LORATADINE 10 MG PO TABS
10.0000 mg | ORAL_TABLET | Freq: Every day | ORAL | Status: DC
  Administered 2018-03-04 – 2018-03-07 (×4): 10 mg via ORAL
  Filled 2018-03-04 (×4): qty 1

## 2018-03-04 MED ORDER — FLUTICASONE PROPIONATE 50 MCG/ACT NA SUSP
2.0000 | Freq: Every day | NASAL | Status: DC | PRN
  Administered 2018-03-05: 2 via NASAL
  Filled 2018-03-04: qty 16

## 2018-03-04 MED ORDER — GUAIFENESIN ER 600 MG PO TB12
600.0000 mg | ORAL_TABLET | Freq: Two times a day (BID) | ORAL | Status: DC | PRN
  Filled 2018-03-04: qty 1

## 2018-03-04 MED ORDER — METOPROLOL TARTRATE 12.5 MG HALF TABLET
12.5000 mg | ORAL_TABLET | Freq: Two times a day (BID) | ORAL | Status: DC
  Administered 2018-03-04 – 2018-03-07 (×7): 12.5 mg via ORAL
  Filled 2018-03-04 (×8): qty 1

## 2018-03-04 MED ORDER — HEPARIN (PORCINE) IN NACL 100-0.45 UNIT/ML-% IJ SOLN
800.0000 [IU]/h | INTRAMUSCULAR | Status: DC
  Filled 2018-03-04: qty 250

## 2018-03-04 NOTE — Progress Notes (Signed)
*  PRELIMINARY RESULTS* Echocardiogram 2D Echocardiogram has been performed.  Leavy Cella 03/04/2018, 2:46 PM

## 2018-03-04 NOTE — ED Provider Notes (Signed)
[facility] PROGRESSIVE CARE Provider Note   CSN: [id number] Arrival date & time: 03/03/18  1620     History   Chief Complaint Chief Complaint  Patient presents with  . Shortness of Breath    HPI [name] is a 84 y.o. male.  Patient with onset of shortness of breath somewhere around or between 2 PM and 3 PM in the afternoon.  Patient states symptoms lasted for 30 minutes and have resolved and he feels fine now.  EMS was called at that time they recommended coming here family refused transport.  However family did bring patient in for evaluation by POV.  Patient has a known history of atrial fib and takes Xarelto for.  Patient had an orthopedic procedure done yesterday which was lumbar radiofrequency ablation by Dr. Mina Marble.  Patient had held his Xarelto but resumed it last evening.  Patient without a history of dementia but with further questioning there may be some component of confusion.  Patient denied any chest pain or discomfort or feeling like he was in a pass out or diaphoresis or nausea or vomiting and currently is completely asymptomatic however patient's daughter stated that the episode was very unusual and he did not look well.     Past Medical History:  Diagnosis Date  . Coronary atherosclerosis of native coronary artery    a. Anterior STEMI 2009 s/p BMSx2 to mid & prox LAD and angioplasty to distal LAD. total RCA with collaterals, moderate Cx plaquing. a. EF 55-60% in 2013.  [name] Elevated troponin   . Gross hematuria   . Gross hematuria 11/18/2008   Qualifier: Diagnosis of  By: Owens Shark, RN, BSN, [name]    . Hemoptysis 05/16/2016  . HTN (hypertension)   . Myocardial infarct ([place]) 03/21/2008  . NSVT (nonsustained ventricular tachycardia) ([place])    a. Per DC summary from time of STEMI 2009.  [name] Paroxysmal atrial flutter ([place])    a. Abnl EKG 01/2012 concerning for atrial flutter, event monitor showed sinus bradycardia only and no pauses. Not felt to be a candidate for  longterm anticoag due to hematuria.  . Phlebitis and thrombophlebitis of superficial vessels of lower extremities   . Pure hypercholesterolemia    a. Has not tolerated statins in the past.  . Right leg DVT ([place])    a. Dx 01/2014.  [name] Sinus bradycardia    a. By prior event monitor.  . Sinus bradycardia    a. By prior event monitor.   . Thrombocytopenia ([place])   . Urinary retention 06/20/2011    Patient Active Problem List   Diagnosis Date Noted  . Persistent atrial fibrillation ([place])   . Leukocytosis   . Sinus congestion 09/13/2017  . Cough 04/25/2017  . Orthostatic hypotension 11/26/2016  . Laceration of right lower extremity 11/26/2016  . Laceration of right forearm 11/01/2016  . Health care maintenance 09/17/2016  . PAD (peripheral artery disease) ([place]) 08/13/2016  . Hyperlipidemia   . Cerebrovascular accident (CVA) ([place]) 08/09/2016  . Cold extremity without peripheral vascular disease   . Abnormal CT of the chest 07/21/2016  . Acute on chronic combined systolic and diastolic congestive heart failure ([place]) 06/17/2016  . Tinea pedis 05/27/2016  . NSTEMI (non-ST elevated myocardial infarction) ([place]) 05/16/2016  . Chronic atrial fibrillation ([place]) 05/16/2016  . Chronic ITP (idiopathic thrombocytopenia) ([facility])   . CHF (congestive heart failure), NYHA class II, chronic, diastolic ([place])   . CAP (community acquired pneumonia) 05/15/2016  . Microscopic hematuria 09/28/2014  .  Gait instability 09/27/2014  . Coronary atherosclerosis of native coronary artery   . Paroxysmal atrial flutter ([place])   . Pure hypercholesterolemia   . HTN (hypertension)   . Degenerative disk disease 12/20/2010    Past Surgical History:  Procedure Laterality Date  . CARPAL TUNNEL RELEASE    . CORONARY STENT PLACEMENT  2009  . REPLACEMENT TOTAL KNEE BILATERAL     Left 2014 (Dr. Eddie Dibbles); right 2012 (Dr. Redmond Pulling)  . trigger finger surgery          Home Medications    Prior to Admission medications     Medication Sig Start Date End Date Taking? Authorizing Provider  fluticasone (FLONASE) 50 MCG/ACT nasal spray Place 2 sprays into both nostrils daily as needed for allergies or rhinitis.   Yes [provider]  furosemide (LASIX) 20 MG tablet Take 2 tablets (40 mg total) by mouth daily. 08/25/17 12/27/25 Yes Daune Perch, NP  guaiFENesin (MUCINEX) 600 MG 12 hr tablet Take by mouth 2 (two) times daily as needed for cough or to loosen phlegm.   Yes [provider]  HYDROcodone-acetaminophen (NORCO/VICODIN) 5-325 MG tablet Take 1 tablet by mouth every 4 (four) hours as needed. Patient taking differently: Take 1 tablet by mouth every 4 (four) hours as needed for moderate pain.  12/27/17  Yes Upstill, Nehemiah Settle, PA-C  loratadine (CLARITIN) 10 MG tablet Take 10 mg by mouth daily.   Yes [provider]  methocarbamol (ROBAXIN) 500 MG tablet Take 500 mg by mouth every 8 (eight) hours as needed for muscle spasms.   Yes [provider]  metoprolol tartrate (LOPRESSOR) 25 MG tablet TAKE 1/2 TABLET BY MOUTH 2 TIMES DAILY Patient taking differently: Take 12.5 mg by mouth 2 (two) times daily.  05/17/17  Yes Burnell Blanks, MD  nitroGLYCERIN (NITROSTAT) 0.4 MG SL tablet Place 1 tablet (0.4 mg total) under the tongue every 5 (five) minutes as needed for chest pain. 10/04/17  Yes Daune Perch, NP  potassium chloride (K-DUR) 10 MEQ tablet Take 1 tablet (10 mEq total) by mouth daily. 01/13/18  Yes Burnell Blanks, MD  pravastatin (PRAVACHOL) 20 MG tablet TAKE 1 TABLET (20 MG TOTAL) BY MOUTH DAILY AT 6 PM. 12/20/17  Yes Leeanne Rio, MD  pregabalin (LYRICA) 75 MG capsule Take 1 capsule (75 mg total) by mouth 2 (two) times daily. 05/18/16  Yes Asencion Partridge, MD  Red Yeast Rice Extract (RED YEAST RICE PO) Take 1,200 mg by mouth daily.   Yes [provider]  Rivaroxaban (XARELTO) 15 MG TABS tablet Take 1 tablet (15 mg total) by mouth daily with supper. 08/24/17   Yes Burnell Blanks, MD    Family History Family History  Problem Relation Age of Onset  . Cancer Sister   . Cancer Sister     Social History Social History   Tobacco Use  . Smoking status: Never Smoker  . Smokeless tobacco: Never Used  Substance Use Topics  . Alcohol use: No  . Drug use: No     Allergies   Prednisone and Rosuvastatin   Review of Systems Review of Systems  Constitutional: Negative for diaphoresis and fever.  HENT: Negative for congestion.   Eyes: Negative for redness.  Respiratory: Positive for shortness of breath.   Cardiovascular: Negative for chest pain.  Gastrointestinal: Negative for abdominal pain, nausea and vomiting.  Genitourinary: Negative for dysuria.  Musculoskeletal: Negative for back pain.  Skin: Negative for rash.  Neurological: Negative for syncope and  headaches.  Hematological: Does not bruise/bleed easily.  Psychiatric/Behavioral: Positive for confusion.     Physical Exam Updated Vital Signs BP (!) 149/98 (BP Location: Left Arm)   Pulse 78   Temp 97.6 F (36.4 C) (Oral)   Resp 18   Ht 1.778 m (5\' 10" )   Wt 78.1 kg   SpO2 99%   BMI 24.71 kg/m   Physical Exam  Constitutional: He appears well-developed and well-nourished. No distress.  HENT:  Head: Normocephalic and atraumatic.  Mouth/Throat: Oropharynx is clear and moist.  Eyes: Pupils are equal, round, and reactive to light. Conjunctivae and EOM are normal.  Neck: Normal range of motion. Neck supple.  Cardiovascular: Normal rate, regular rhythm and normal heart sounds.  Pulmonary/Chest: Effort normal and breath sounds normal. No respiratory distress.  Abdominal: Soft. Bowel sounds are normal. There is no tenderness.  Musculoskeletal: Normal range of motion. He exhibits no edema.  Neurological: He is alert. No cranial nerve deficit or sensory deficit. He exhibits normal muscle tone. Coordination normal.  Skin: Skin is warm.  Nursing note and vitals  reviewed.    ED Treatments / Results  Labs (all labs ordered are listed, but only abnormal results are displayed) Labs Reviewed  BASIC METABOLIC PANEL - Abnormal; Notable for the following components:      Result Value   Glucose, Bld 218 (*)    BUN 28 (*)    Creatinine, Ser 1.33 (*)    GFR calc non Af Amer 45 (*)    GFR calc Af Amer 52 (*)    All other components within normal limits  CBC WITH DIFFERENTIAL/PLATELET - Abnormal; Notable for the following components:   WBC 10.9 (*)    Platelets 107 (*)    Neutro Abs 9.8 (*)    Lymphs Abs 0.6 (*)    All other components within normal limits  CBC - Abnormal; Notable for the following components:   Platelets 105 (*)    All other components within normal limits  I-STAT TROPONIN, ED - Abnormal; Notable for the following components:   Troponin i, poc 1.48 (*)    All other components within normal limits  CBG MONITORING, ED - Abnormal; Notable for the following components:   Glucose-Capillary 155 (*)    All other components within normal limits  HEPARIN LEVEL (UNFRACTIONATED)  CBC  APTT  BRAIN NATRIURETIC PEPTIDE  TROPONIN I  TROPONIN I  TROPONIN I  BASIC METABOLIC PANEL  LIPID PANEL  HEMOGLOBIN A1C  I-STAT TROPONIN, ED  I-STAT TROPONIN, ED  I-STAT TROPONIN, ED    EKG EKG Interpretation  Date/Time:  Friday March 03 2018 21:44:05 EDT Ventricular Rate:  66 PR Interval:    QRS Duration: 126 QT Interval:  421 QTC Calculation: 442 R Axis:   -86 Text Interpretation:  Atrial fibrillation Right bundle branch block No significant change since last tracing Confirmed by Fredia Sorrow [phone]) on 03/03/2018 9:52:12 PM   Radiology Dg Chest 2 View  Result Date: 03/03/2018 CLINICAL DATA:  Shortness of breath since 1500 hours, history coronary disease post MI, hypertension EXAM: CHEST - 2 VIEW COMPARISON:  09/13/2017 FINDINGS: Enlargement of cardiac silhouette. Mediastinal contours and pulmonary vascularity normal.  Atherosclerotic calcifications aorta. RIGHT basilar atelectasis versus infiltrate. Remaining lungs clear. No pleural effusion or pneumothorax. Bones unremarkable. IMPRESSION: Enlargement of cardiac silhouette. RIGHT basilar atelectasis versus infiltrate. Electronically Signed   By: Lavonia Dana M.D.   On: 03/03/2018 18:06    Procedures Procedures (including critical care time)  CRITICAL  CARE Performed by: Fredia Sorrow Total critical care time: 30 minutes Critical care time was exclusive of separately billable procedures and treating other patients. Critical care was necessary to treat or prevent imminent or life-threatening deterioration. Critical care was time spent personally by me on the following activities: development of treatment plan with patient and/or surrogate as well as nursing, discussions with consultants, evaluation of patient's response to treatment, examination of patient, obtaining history from patient or surrogate, ordering and performing treatments and interventions, ordering and review of laboratory studies, ordering and review of radiographic studies, pulse oximetry and re-evaluation of patient's condition.   Medications Ordered in ED Medications  heparin ADULT infusion 100 units/mL (25000 units/237mL sodium chloride 0.45%) (1,000 Units/hr Intravenous Handoff 03/04/18 0016)  metoprolol tartrate (LOPRESSOR) tablet 12.5 mg (has no administration in time range)  pravastatin (PRAVACHOL) tablet 20 mg (has no administration in time range)  pregabalin (LYRICA) capsule 75 mg (has no administration in time range)  fluticasone (FLONASE) 50 MCG/ACT nasal spray 2 spray (has no administration in time range)  guaiFENesin (MUCINEX) 12 hr tablet 600 mg (has no administration in time range)  loratadine (CLARITIN) tablet 10 mg (has no administration in time range)  aspirin EC tablet 81 mg (has no administration in time range)  nitroGLYCERIN (NITROSTAT) SL tablet 0.4 mg (has no  administration in time range)  acetaminophen (TYLENOL) tablet 650 mg (has no administration in time range)  furosemide (LASIX) tablet 40 mg (has no administration in time range)  aspirin chewable tablet 324 mg (324 mg Oral Given 03/03/18 2222)  heparin injection 4,000 Units (4,000 Units Intravenous Given 03/03/18 2222)     Initial Impression / Assessment and Plan / ED Course  I have reviewed the triage vital signs and the nursing notes.  Pertinent labs & imaging results that were available during my care of the patient were reviewed by me and considered in my medical decision making (see chart for details).     Patient with an episode of shortness of breath as per patient around 2:00 in the afternoon that lasted for about 30 minutes.  But patient's family thought there was more to it than that.  Patient did not look well.  Patient states he feels fine now denies any chest pain at that time.  Patient is initial troponin here was negative chest x-ray raise some question of atelectasis or may be early infiltrate however patient really without any respiratory infection type symptoms.  Due to the unusual nature even though there was no chest pain did a a second troponin.  This troponin was about 5 hours after the onset of the symptoms and it was elevated at 1.04.  Based on this concern for non-STEMI was raised.  EKG was repeated without any significant changes.  Patient still was asymptomatic.  Patient given aspirin.  Heparin ordered as per non-STEMI protocol.  Patient is normally on Xarelto for atrial fib.  Patient restarted his Xarelto last night.  Patient had held it for a orthopedic procedure that was done yesterday.  Patient had a lumbar radiofrequency ablation for chronic nerve pain.  Done by [facility] orthopedics Dr. Mina Marble.  Due to the recent procedure did contact on-call orthopedic for [facility] it was okay to go with heparin.  No concerns from a surgical standpoint.  That makes sense since  patient had already restarted his Xarelto last night.  Cardiology contacted seen by cardiology fellow they will admit for a non-STEMI.  Patient's work-up otherwise without any significant abnormalities.  Patient very youthful for his age.  Final Clinical Impressions(s) / ED Diagnoses   Final diagnoses:  None    ED Discharge Orders    None       Fredia Sorrow, MD 03/04/18 [phone]

## 2018-03-04 NOTE — Progress Notes (Signed)
Heparin level > 2.20. Pharmacist aware. Stat repeat labs ordered.

## 2018-03-04 NOTE — Progress Notes (Addendum)
ANTICOAGULATION CONSULT NOTE - Initial Consult  Pharmacy Consult for Heparin Indication: chest pain/ACS  Allergies  Allergen Reactions  . Prednisone Other (See Comments)    Does not feel right  . Rosuvastatin Other (See Comments)    Aches   Patient Measurements: Height: 5\' 10"  (177.8 cm) Weight: 172 lb 2.9 oz (78.1 kg) IBW/kg (Calculated) : 73 Heparin Dosing Weight: 79 kg  Vital Signs: Temp: 97.6 F (36.4 C) (08/24 1441) Temp Source: Oral (08/24 1441) BP: 136/95 (08/24 1441) Pulse Rate: 95 (08/24 1441)  Labs: Recent Labs    03/03/18 1642 03/03/18 2235 03/04/18 0048 03/04/18 0639 03/04/18 1235 03/04/18 1611  HGB 14.3 13.7  --  13.5  --   --   HCT 44.2 43.4  --  41.8  --   --   PLT 107* 105*  --  107*  --   --   APTT  --   --   --  >200*  --  81*  HEPARINUNFRC  --   --   --  >2.20*  --   --   CREATININE 1.33*  --   --  1.27*  --   --   TROPONINI  --   --  4.86* 14.35* 40.85*  --    Estimated Creatinine Clearance: 39.1 mL/min (A) (by C-G formula based on SCr of 1.27 mg/dL (H)).  Assessment:  79 yoM on xarelto PTA (last dose PTA on 8/23) for h/o afib presenting with chest pain, troponin 1.48. Pharmacy consulted to dose IV heparin. Dosing via aPTT for now given Eliquis effect on heparin levels. aPTT initially elevated, now within goal range at 81 this PM following heparin held x1 hr and rate decrease. Hgb 13.5, 107. No bleeding noted.    Goal of Therapy:  aPTT 66-102s Heparin level 0.3-0.7 units/ml Monitor platelets by anticoagulation protocol: Yes   Plan:  Continue heparin infusion at 800 units/hr Daily heparin level/aPTT and CBC Monitor s/sx of bleeding  Erin N. Gerarda Fraction, PharmD PGY2 Infectious Diseases Pharmacy Resident Phone: [phone] 03/04/2018  5:37 PM

## 2018-03-04 NOTE — Progress Notes (Signed)
Critical Lab value received: Troponin 4.86. Increased from 1.48. On call Cardiologist Dr. Theresa Duty notified. Order received to have another Troponin to be drawn in three hours. Will notified MD once resulted. Pt. denies any chest pain or SOB. Pt. on IV Heparin gtt at ordered rate.

## 2018-03-04 NOTE — Progress Notes (Signed)
Trop = 14.35. Rosaria Ferries, NP aware.

## 2018-03-04 NOTE — Progress Notes (Signed)
Progress Note  Patient Name: [name] Date of Encounter: 03/04/2018  Primary Cardiologist: Alan Chandler, MD   Subjective   No additional chest pain or sob.   Inpatient Medications    Scheduled Meds: . aspirin EC  81 mg Oral Daily  . cycloSPORINE  1 drop Both Eyes BID  . furosemide  40 mg Oral Daily  . loratadine  10 mg Oral Daily  . metoprolol tartrate  12.5 mg Oral BID  . pravastatin  20 mg Oral q1800  . pregabalin  75 mg Oral BID   Continuous Infusions: . heparin 800 Units/hr (03/04/18 1001)   PRN Meds: acetaminophen, fluticasone, guaiFENesin, nitroGLYCERIN   Vital Signs    Vitals:   03/03/18 2130 03/03/18 2324 03/04/18 0017 03/04/18 0523  BP: (!) 141/91 (!) 145/88 (!) 149/98 (!) 149/90  Pulse: 65 77 78 92  Resp: (!) 24 18 18 19   Temp:   97.6 F (36.4 C) 97.8 F (36.6 C)  TempSrc:   Oral Oral  SpO2: 94% 97% 99% 93%  Weight:   78.1 kg   Height:        Intake/Output Summary (Last 24 hours) at 03/04/2018 1109 Last data filed at 03/04/2018 3086 Gross per 24 hour  Intake 410 ml  Output 220 ml  Net 190 ml   Filed Weights   03/03/18 1634 03/04/18 0017  Weight: 79.8 kg 78.1 kg    Telemetry    Atrial fib with a controlled VR - Personally Reviewed  ECG    Atrial fib with a controlled VR, no acute STT changes. - Personally Reviewed  Physical Exam   GEN: No acute distress.   Neck: No JVD Cardiac: IRIRR, no murmurs, rubs, or gallops.  Respiratory: Clear to auscultation bilaterally. GI: Soft, nontender, non-distended  MS: No edema; No deformity. Neuro:  Nonfocal  Psych: Normal affect   Labs    Chemistry Recent Labs  Lab 03/03/18 1642 03/04/18 0639  NA 139 138  K 3.9 3.9  CL 102 104  CO2 27 25  GLUCOSE 218* 147*  BUN 28* 32*  CREATININE 1.33* 1.27*  CALCIUM 9.8 9.2  GFRNONAA 45* 48*  GFRAA 52* 55*  ANIONGAP 10 9     Hematology Recent Labs  Lab 03/03/18 1642 03/03/18 2235 03/04/18 0639  WBC 10.9* 10.5 10.7*    RBC 4.98 4.89 4.81  HGB 14.3 13.7 13.5  HCT 44.2 43.4 41.8  MCV 88.8 88.8 86.9  MCH 28.7 28.0 28.1  MCHC 32.4 31.6 32.3  RDW 13.8 13.8 13.9  PLT 107* 105* 107*    Cardiac Enzymes Recent Labs  Lab 03/04/18 0048 03/04/18 0639  TROPONINI 4.86* 14.35*    Recent Labs  Lab 03/03/18 1649 03/03/18 2124  TROPIPOC 0.03 1.48*     BNP Recent Labs  Lab 03/03/18 2317  BNP 526.6*     DDimer No results for input(s): DDIMER in the last 168 hours.   Radiology    Dg Chest 2 View  Result Date: 03/03/2018 CLINICAL DATA:  Shortness of breath since 1500 hours, history coronary disease post MI, hypertension EXAM: CHEST - 2 VIEW COMPARISON:  09/13/2017 FINDINGS: Enlargement of cardiac silhouette. Mediastinal contours and pulmonary vascularity normal. Atherosclerotic calcifications aorta. RIGHT basilar atelectasis versus infiltrate. Remaining lungs clear. No pleural effusion or pneumothorax. Bones unremarkable. IMPRESSION: Enlargement of cardiac silhouette. RIGHT basilar atelectasis versus infiltrate. Electronically Signed   By: Lavonia Dana M.D.   On: 03/03/2018 18:06    Cardiac Studies   none  Patient Profile     84 y.o. male admitted with chest pressure and sob and ruled in for NSTEMI  Assessment & Plan    1. NTEMI - despite his advanced age, he is highly functional. He will undergo left heart cath on Monday. Continue IV heparin for now. 2. Atrial fib - his ventricular rate is well controlled.  3. Chronic systolic heart failure - he appears to be euvolemic at this point. Continue current meds. 4. Back pain - he is s/p spinal nerve ablation procedure 2 days ago. He denies any back pain.   For questions or updates, please contact [place] Please consult [email or web address] for contact info under Cardiology/STEMI.      Signed, Alan Peru, MD  03/04/2018, 11:09 AM  Patient ID: [name], male   DOB: 12/18/1923, 84 y.o.   MRN: [id number]

## 2018-03-05 LAB — CBC
HCT: 41.1 % (ref 39.0–52.0)
HEMOGLOBIN: 13.4 g/dL (ref 13.0–17.0)
MCH: 28.5 pg (ref 26.0–34.0)
MCHC: 32.6 g/dL (ref 30.0–36.0)
MCV: 87.4 fL (ref 78.0–100.0)
Platelets: 98 10*3/uL — ABNORMAL LOW (ref 150–400)
RBC: 4.7 MIL/uL (ref 4.22–5.81)
RDW: 14 % (ref 11.5–15.5)
WBC: 7.6 10*3/uL (ref 4.0–10.5)

## 2018-03-05 LAB — HEPARIN LEVEL (UNFRACTIONATED): HEPARIN UNFRACTIONATED: 0.83 [IU]/mL — AB (ref 0.30–0.70)

## 2018-03-05 LAB — APTT: aPTT: 77 seconds — ABNORMAL HIGH (ref 24–36)

## 2018-03-05 LAB — PROTIME-INR
INR: 1.25
PROTHROMBIN TIME: 15.6 s — AB (ref 11.4–15.2)

## 2018-03-05 MED ORDER — SODIUM CHLORIDE 0.9% FLUSH: 3.0000 mL | INTRAVENOUS | Status: DC | PRN

## 2018-03-05 MED ORDER — SODIUM CHLORIDE 0.9 % WEIGHT BASED INFUSION
1.0000 mL/kg/h | INTRAVENOUS | Status: DC
  Administered 2018-03-06 (×2): 1 mL/kg/h via INTRAVENOUS

## 2018-03-05 MED ORDER — ALUM HYDROXIDE-MAG TRISILICATE 80-20 MG PO CHEW
1.0000 | CHEWABLE_TABLET | Freq: Three times a day (TID) | ORAL | Status: DC | PRN
  Administered 2018-03-07: 1 via ORAL
  Filled 2018-03-05 (×2): qty 2

## 2018-03-05 MED ORDER — SODIUM CHLORIDE 0.9% FLUSH: 3.0000 mL | Freq: Two times a day (BID) | INTRAVENOUS | Status: DC

## 2018-03-05 MED ORDER — SODIUM CHLORIDE 0.9 % WEIGHT BASED INFUSION
3.0000 mL/kg/h | INTRAVENOUS | Status: DC
  Administered 2018-03-06: 3 mL/kg/h via INTRAVENOUS

## 2018-03-05 MED ORDER — ASPIRIN EC 81 MG PO TBEC
81.0000 mg | DELAYED_RELEASE_TABLET | Freq: Every day | ORAL | Status: DC
  Filled 2018-03-05: qty 1

## 2018-03-05 MED ORDER — SODIUM CHLORIDE 0.9 % IV SOLN: 250.0000 mL | INTRAVENOUS | Status: DC | PRN

## 2018-03-05 MED ORDER — ASPIRIN 81 MG PO CHEW
81.0000 mg | CHEWABLE_TABLET | ORAL | Status: AC
  Administered 2018-03-06: 81 mg via ORAL
  Filled 2018-03-05: qty 1

## 2018-03-05 NOTE — Plan of Care (Signed)
NSTEMI. VSS. Heparin Gtt. Plan Heart Cath tomorrow. Consent Signed.

## 2018-03-05 NOTE — Progress Notes (Signed)
Progress Note  Patient Name: [name] Date of Encounter: 03/05/2018  Primary Cardiologist: Lauree Chandler, MD   Subjective   No chest pain or sob.   Inpatient Medications    Scheduled Meds: . aspirin EC  81 mg Oral Daily  . cycloSPORINE  1 drop Both Eyes BID  . furosemide  40 mg Oral Daily  . loratadine  10 mg Oral Daily  . metoprolol tartrate  12.5 mg Oral BID  . pravastatin  20 mg Oral q1800  . pregabalin  75 mg Oral BID   Continuous Infusions: . heparin 800 Units/hr (03/04/18 2145)   PRN Meds: acetaminophen, alum hydroxide-mag trisilicate, fluticasone, guaiFENesin, nitroGLYCERIN   Vital Signs    Vitals:   03/04/18 0523 03/04/18 1441 03/04/18 2050 03/05/18 0406  BP: (!) 149/90 (!) 136/95 124/75 112/77  Pulse: 92 95 66 69  Resp: 19  15   Temp: 97.8 F (36.6 C) 97.6 F (36.4 C) 97.6 F (36.4 C) 97.9 F (36.6 C)  TempSrc: Oral Oral Oral Oral  SpO2: 93% 94% 95% 98%  Weight:      Height:        Intake/Output Summary (Last 24 hours) at 03/05/2018 0953 Last data filed at 03/05/2018 0500 Gross per 24 hour  Intake 369.87 ml  Output 300 ml  Net 69.87 ml   Filed Weights   03/03/18 1634 03/04/18 0017  Weight: 79.8 kg 78.1 kg    Telemetry    Atrial fib with a CVR - Personally Reviewed  ECG    none - Personally Reviewed  Physical Exam   GEN: No acute distress.   Neck: No JVD Cardiac: IRIRR, no murmurs, rubs, or gallops.  Respiratory: Clear to auscultation bilaterally. GI: Soft, nontender, non-distended  MS: No edema; No deformity. Neuro:  Nonfocal  Psych: Normal affect   Labs    Chemistry Recent Labs  Lab 03/03/18 1642 03/04/18 0639  NA 139 138  K 3.9 3.9  CL 102 104  CO2 27 25  GLUCOSE 218* 147*  BUN 28* 32*  CREATININE 1.33* 1.27*  CALCIUM 9.8 9.2  GFRNONAA 45* 48*  GFRAA 52* 55*  ANIONGAP 10 9     Hematology Recent Labs  Lab 03/03/18 2235 03/04/18 0639 03/05/18 0556  WBC 10.5 10.7* 7.6  RBC 4.89 4.81  4.70  HGB 13.7 13.5 13.4  HCT 43.4 41.8 41.1  MCV 88.8 86.9 87.4  MCH 28.0 28.1 28.5  MCHC 31.6 32.3 32.6  RDW 13.8 13.9 14.0  PLT 105* 107* 98*    Cardiac Enzymes Recent Labs  Lab 03/04/18 0048 03/04/18 0639 03/04/18 1235  TROPONINI 4.86* 14.35* 40.85*    Recent Labs  Lab 03/03/18 1649 03/03/18 2124  TROPIPOC 0.03 1.48*     BNP Recent Labs  Lab 03/03/18 2317  BNP 526.6*     DDimer No results for input(s): DDIMER in the last 168 hours.   Radiology    Dg Chest 2 View  Result Date: 03/03/2018 CLINICAL DATA:  Shortness of breath since 1500 hours, history coronary disease post MI, hypertension EXAM: CHEST - 2 VIEW COMPARISON:  09/13/2017 FINDINGS: Enlargement of cardiac silhouette. Mediastinal contours and pulmonary vascularity normal. Atherosclerotic calcifications aorta. RIGHT basilar atelectasis versus infiltrate. Remaining lungs clear. No pleural effusion or pneumothorax. Bones unremarkable. IMPRESSION: Enlargement of cardiac silhouette. RIGHT basilar atelectasis versus infiltrate. Electronically Signed   By: Lavonia Dana M.D.   On: 03/03/2018 18:06    Cardiac Studies   none  Patient Profile  84 y.o. male admitted with NSTEMI  Assessment & Plan    1. NTEMI - he is pain free. Troponin 40! He is pain free. He will undergo left heart cath. 2. Atrial fib - his rate is controlled. He will continue his current meds.  Gregg Taylor,M.D.  For questions or updates, please contact [place] Please consult [email or web address] for contact info under Cardiology/STEMI.      Signed, Cristopher Peru, MD  03/05/2018, 9:53 AM  Patient ID: [name], male   DOB: 1923/10/24, 84 y.o.   MRN: [id number]

## 2018-03-05 NOTE — Progress Notes (Signed)
[facility] for Heparin Indication: chest pain/ACS  Allergies  Allergen Reactions  . Prednisone Other (See Comments)    Does not feel right  . Rosuvastatin Other (See Comments)    Aches   Patient Measurements: Height: 5\' 10"  (177.8 cm) Weight: 172 lb 2.9 oz (78.1 kg) IBW/kg (Calculated) : 73 Heparin Dosing Weight: 79 kg  Vital Signs: Temp: 97.9 F (36.6 C) (08/25 0406) Temp Source: Oral (08/25 0406) BP: 112/77 (08/25 0406) Pulse Rate: 69 (08/25 0406)  Labs: Recent Labs    03/03/18 1642 03/03/18 2235 03/04/18 0048 03/04/18 0639 03/04/18 1235 03/04/18 1611 03/05/18 0556  HGB 14.3 13.7  --  13.5  --   --  13.4  HCT 44.2 43.4  --  41.8  --   --  41.1  PLT 107* 105*  --  107*  --   --  98*  APTT  --   --   --  >200*  --  81* 77*  HEPARINUNFRC  --   --   --  >2.20*  --   --  0.83*  CREATININE 1.33*  --   --  1.27*  --   --   --   TROPONINI  --   --  4.86* 14.35* 40.85*  --   --    Estimated Creatinine Clearance: 39.1 mL/min (A) (by C-G formula based on SCr of 1.27 mg/dL (H)).  Assessment:  77 yoM on xarelto PTA (last dose PTA on 8/23) for h/o afib presenting with chest pain, troponin up to 40. Pharmacy consulted to dose IV heparin. Dosing via aPTT for now given Eliquis effect on heparin levels. Plans for cath on 8/26 -aPTT= 77 and at goal    Goal of Therapy:  aPTT 66-102s Heparin level 0.3-0.7 units/ml Monitor platelets by anticoagulation protocol: Yes   Plan:  Continue heparin infusion at 800 units/hr Daily heparin level/aPTT and CBC  Hildred Laser, PharmD Clinical Pharmacist Please check Amion for pharmacy contact number

## 2018-03-06 ENCOUNTER — Encounter (HOSPITAL_COMMUNITY)
Admission: EM | Disposition: A | Discharge status: Home / Self Care | Payer: Self-pay | Source: Home / Self Care | Attending: Cardiovascular Disease

## 2018-03-06 ENCOUNTER — Encounter (HOSPITAL_COMMUNITY): Payer: Self-pay | Admitting: Cardiovascular Disease

## 2018-03-06 DIAGNOSIS — I2510 Atherosclerotic heart disease of native coronary artery without angina pectoris: Secondary | ICD-10-CM

## 2018-03-06 DIAGNOSIS — I5043 Acute on chronic combined systolic (congestive) and diastolic (congestive) heart failure: Secondary | ICD-10-CM

## 2018-03-06 HISTORY — PX: LEFT HEART CATH AND CORONARY ANGIOGRAPHY: CATH118249

## 2018-03-06 HISTORY — PX: CORONARY STENT INTERVENTION: CATH118234

## 2018-03-06 LAB — CBC
HCT: 41.3 % (ref 39.0–52.0)
Hemoglobin: 13.3 g/dL (ref 13.0–17.0)
MCH: 28.4 pg (ref 26.0–34.0)
MCHC: 32.2 g/dL (ref 30.0–36.0)
MCV: 88.2 fL (ref 78.0–100.0)
Platelets: 95 10*3/uL — ABNORMAL LOW (ref 150–400)
RBC: 4.68 MIL/uL (ref 4.22–5.81)
RDW: 14.1 % (ref 11.5–15.5)
WBC: 5.4 10*3/uL (ref 4.0–10.5)

## 2018-03-06 LAB — HEPARIN LEVEL (UNFRACTIONATED): HEPARIN UNFRACTIONATED: 0.38 [IU]/mL (ref 0.30–0.70)

## 2018-03-06 LAB — POCT ACTIVATED CLOTTING TIME: Activated Clotting Time: 450 seconds

## 2018-03-06 LAB — APTT: aPTT: 82 seconds — ABNORMAL HIGH (ref 24–36)

## 2018-03-06 SURGERY — LEFT HEART CATH AND CORONARY ANGIOGRAPHY
Anesthesia: LOCAL

## 2018-03-06 MED ORDER — FAMOTIDINE IN NACL 20-0.9 MG/50ML-% IV SOLN
INTRAVENOUS | Status: AC | PRN
  Administered 2018-03-06: 20 mg via INTRAVENOUS

## 2018-03-06 MED ORDER — LIDOCAINE HCL (PF) 1 % IJ SOLN
INTRAMUSCULAR | Status: AC
  Filled 2018-03-06: qty 30

## 2018-03-06 MED ORDER — ANGIOPLASTY BOOK
Freq: Once | Status: AC
  Administered 2018-03-06: 22:00:00
  Filled 2018-03-06: qty 1

## 2018-03-06 MED ORDER — SODIUM CHLORIDE 0.9% FLUSH: 3.0000 mL | INTRAVENOUS | Status: DC | PRN

## 2018-03-06 MED ORDER — METHOCARBAMOL 500 MG PO TABS
500.0000 mg | ORAL_TABLET | Freq: Three times a day (TID) | ORAL | Status: DC | PRN
  Administered 2018-03-06: 500 mg via ORAL
  Filled 2018-03-06 (×2): qty 1

## 2018-03-06 MED ORDER — HYDRALAZINE HCL 20 MG/ML IJ SOLN: 5.0000 mg | INTRAMUSCULAR | Status: AC | PRN

## 2018-03-06 MED ORDER — IOPAMIDOL (ISOVUE-370) INJECTION 76%
INTRAVENOUS | Status: DC | PRN
  Administered 2018-03-06: 155 mL via INTRA_ARTERIAL

## 2018-03-06 MED ORDER — CLOPIDOGREL BISULFATE 300 MG PO TABS
ORAL_TABLET | ORAL | Status: AC
  Filled 2018-03-06: qty 1

## 2018-03-06 MED ORDER — CLOPIDOGREL BISULFATE 75 MG PO TABS
75.0000 mg | ORAL_TABLET | Freq: Every day | ORAL | Status: DC
  Administered 2018-03-07: 75 mg via ORAL
  Filled 2018-03-06: qty 1

## 2018-03-06 MED ORDER — MORPHINE SULFATE (PF) 2 MG/ML IV SOLN: 2.0000 mg | INTRAVENOUS | Status: DC | PRN

## 2018-03-06 MED ORDER — HEART ATTACK BOUNCING BOOK
Freq: Once | Status: AC
  Administered 2018-03-06: 22:00:00
  Filled 2018-03-06: qty 1

## 2018-03-06 MED ORDER — BIVALIRUDIN TRIFLUOROACETATE 250 MG IV SOLR
INTRAVENOUS | Status: AC
  Filled 2018-03-06: qty 250

## 2018-03-06 MED ORDER — HEPARIN (PORCINE) IN NACL 1000-0.9 UT/500ML-% IV SOLN
INTRAVENOUS | Status: AC
  Filled 2018-03-06: qty 1000

## 2018-03-06 MED ORDER — FAMOTIDINE IN NACL 20-0.9 MG/50ML-% IV SOLN
INTRAVENOUS | Status: AC
  Filled 2018-03-06: qty 50

## 2018-03-06 MED ORDER — SODIUM CHLORIDE 0.9 % IV SOLN: INTRAVENOUS | Status: AC

## 2018-03-06 MED ORDER — LIDOCAINE HCL (PF) 1 % IJ SOLN
INTRAMUSCULAR | Status: DC | PRN
  Administered 2018-03-06: 15 mL via INTRADERMAL

## 2018-03-06 MED ORDER — ASPIRIN 81 MG PO CHEW
81.0000 mg | CHEWABLE_TABLET | Freq: Every day | ORAL | Status: DC
  Administered 2018-03-07: 81 mg via ORAL
  Filled 2018-03-06: qty 1

## 2018-03-06 MED ORDER — ACETAMINOPHEN 325 MG PO TABS: 650.0000 mg | ORAL_TABLET | ORAL | Status: DC | PRN

## 2018-03-06 MED ORDER — HYDROCODONE-ACETAMINOPHEN 5-325 MG PO TABS
1.0000 | ORAL_TABLET | ORAL | Status: DC | PRN
  Administered 2018-03-06: 1 via ORAL
  Filled 2018-03-06: qty 1

## 2018-03-06 MED ORDER — SODIUM CHLORIDE 0.9% FLUSH
3.0000 mL | Freq: Two times a day (BID) | INTRAVENOUS | Status: DC
  Administered 2018-03-06: 22:00:00 via INTRAVENOUS
  Administered 2018-03-06: 3 mL via INTRAVENOUS

## 2018-03-06 MED ORDER — IOPAMIDOL (ISOVUE-370) INJECTION 76%
INTRAVENOUS | Status: AC
  Filled 2018-03-06: qty 100

## 2018-03-06 MED ORDER — SODIUM CHLORIDE 0.9 % IV SOLN
INTRAVENOUS | Status: AC | PRN
  Administered 2018-03-06 (×2): 1.75 mg/kg/h via INTRAVENOUS

## 2018-03-06 MED ORDER — ONDANSETRON HCL 4 MG/2ML IJ SOLN
4.0000 mg | Freq: Four times a day (QID) | INTRAMUSCULAR | Status: DC | PRN
  Filled 2018-03-06: qty 2

## 2018-03-06 MED ORDER — SODIUM CHLORIDE 0.9 % IV SOLN: 250.0000 mL | INTRAVENOUS | Status: DC | PRN

## 2018-03-06 MED ORDER — SODIUM CHLORIDE 0.9 % IV SOLN
1.7500 mg/kg/h | INTRAVENOUS | Status: DC
  Filled 2018-03-06: qty 250

## 2018-03-06 MED ORDER — ATORVASTATIN CALCIUM 80 MG PO TABS
80.0000 mg | ORAL_TABLET | Freq: Every day | ORAL | Status: DC
  Administered 2018-03-06: 80 mg via ORAL
  Filled 2018-03-06: qty 1

## 2018-03-06 MED ORDER — CLOPIDOGREL BISULFATE 300 MG PO TABS
ORAL_TABLET | ORAL | Status: DC | PRN
  Administered 2018-03-06: 600 mg via ORAL

## 2018-03-06 MED ORDER — LABETALOL HCL 5 MG/ML IV SOLN: 10.0000 mg | INTRAVENOUS | Status: AC | PRN

## 2018-03-06 MED ORDER — HEPARIN (PORCINE) IN NACL 1000-0.9 UT/500ML-% IV SOLN
INTRAVENOUS | Status: DC | PRN
  Administered 2018-03-06 (×2): 500 mL

## 2018-03-06 MED ORDER — SODIUM CHLORIDE 0.9 % IV SOLN
1.7500 mg/kg/h | INTRAVENOUS | Status: AC
  Administered 2018-03-06: 13:00:00 1.75 mg/kg/h via INTRAVENOUS
  Filled 2018-03-06: qty 250

## 2018-03-06 MED ORDER — BIVALIRUDIN BOLUS VIA INFUSION - CUPID
INTRAVENOUS | Status: DC | PRN
  Administered 2018-03-06: 58.575 mg via INTRAVENOUS

## 2018-03-06 SURGICAL SUPPLY — 17 items
BALLN SAPPHIRE 2.0X12 (BALLOONS) ×2
BALLOON SAPPHIRE 2.0X12 (BALLOONS) ×1 IMPLANT
CATH INFINITI 5FR MULTPACK ANG (CATHETERS) ×2 IMPLANT
CATH VISTA GUIDE 6FR XB3.5 (CATHETERS) ×2 IMPLANT
GUIDEWIRE INQWIRE 1.5J.035X260 (WIRE) IMPLANT
INQWIRE 1.5J .035X260CM (WIRE)
KIT ENCORE 26 ADVANTAGE (KITS) ×2 IMPLANT
KIT HEART LEFT (KITS) ×2 IMPLANT
PACK CARDIAC CATHETERIZATION (CUSTOM PROCEDURE TRAY) ×2 IMPLANT
SHEATH PINNACLE 5F 10CM (SHEATH) ×2 IMPLANT
SHEATH PINNACLE 6F 10CM (SHEATH) ×2 IMPLANT
STENT SYNERGY DES 3X20 (Permanent Stent) ×2 IMPLANT
TRANSDUCER W/STOPCOCK (MISCELLANEOUS) ×2 IMPLANT
TUBING CIL FLEX 10 FLL-RA (TUBING) ×2 IMPLANT
WIRE ASAHI PROWATER 180CM (WIRE) ×2 IMPLANT
WIRE EMERALD 3MM-J .035X260CM (WIRE) IMPLANT
WIRE HITORQ VERSACORE ST 145CM (WIRE) ×2 IMPLANT

## 2018-03-06 NOTE — Progress Notes (Addendum)
Progress Note  Patient Name: [name] Date of Encounter: 03/06/2018  Primary Cardiologist: Lauree Chandler, MD  Subjective   Feeling well this morning. No chest pain.   No further chest pain or dyspnea.  Inpatient Medications    Scheduled Meds: . [START ON 03/07/2018] aspirin EC  81 mg Oral Daily  . cycloSPORINE  1 drop Both Eyes BID  . furosemide  40 mg Oral Daily  . loratadine  10 mg Oral Daily  . metoprolol tartrate  12.5 mg Oral BID  . pravastatin  20 mg Oral q1800  . pregabalin  75 mg Oral BID  . sodium chloride flush  3 mL Intravenous Q12H   Continuous Infusions: . sodium chloride    . sodium chloride 1 mL/kg/hr (03/06/18 0617)  . heparin 800 Units/hr (03/05/18 0500)   PRN Meds: sodium chloride, acetaminophen, alum hydroxide-mag trisilicate, fluticasone, guaiFENesin, nitroGLYCERIN, sodium chloride flush   Vital Signs    Vitals:   03/04/18 2050 03/05/18 0406 03/05/18 1454 03/05/18 2054  BP: 124/75 112/77 118/69 117/83  Pulse: 66 69 64 61  Resp: 15   14  Temp: 97.6 F (36.4 C) 97.9 F (36.6 C) 97.7 F (36.5 C) 97.7 F (36.5 C)  TempSrc: Oral Oral Oral Oral  SpO2: 95% 98% 98% 94%  Weight:      Height:        Intake/Output Summary (Last 24 hours) at 03/06/2018 0823 Last data filed at 03/06/2018 0503 Gross per 24 hour  Intake 400.4 ml  Output 100 ml  Net 300.4 ml   Filed Weights   03/03/18 1634 03/04/18 0017  Weight: 79.8 kg 78.1 kg    Telemetry    Afib Rate controlled. - Personally Reviewed  Physical Exam   General: Well developed, well nourished, male appearing in no acute distress. Head: Normocephalic, atraumatic.  Neck: Supple, JVD. Lungs:  Resp regular and unlabored, CTA. Heart: Irreg Irreg, S1, S2, no murmur; no rub. Abdomen: Soft, non-tender, non-distended with normoactive bowel sounds.  Extremities: No clubbing, cyanosis, edema. Distal pedal pulses are 2+ bilaterally. Neuro: Alert and oriented X 3. Moves all  extremities spontaneously. Psych: Normal affect.  Labs    Chemistry Recent Labs  Lab 03/03/18 1642 03/04/18 0639  NA 139 138  K 3.9 3.9  CL 102 104  CO2 27 25  GLUCOSE 218* 147*  BUN 28* 32*  CREATININE 1.33* 1.27*  CALCIUM 9.8 9.2  GFRNONAA 45* 48*  GFRAA 52* 55*  ANIONGAP 10 9     Hematology Recent Labs  Lab 03/04/18 0639 03/05/18 0556 03/06/18 0520  WBC 10.7* 7.6 5.4  RBC 4.81 4.70 4.68  HGB 13.5 13.4 13.3  HCT 41.8 41.1 41.3  MCV 86.9 87.4 88.2  MCH 28.1 28.5 28.4  MCHC 32.3 32.6 32.2  RDW 13.9 14.0 14.1  PLT 107* 98* 95*    Cardiac Enzymes Recent Labs  Lab 03/04/18 0048 03/04/18 0639 03/04/18 1235  TROPONINI 4.86* 14.35* 40.85*    Recent Labs  Lab 03/03/18 1649 03/03/18 2124  TROPIPOC 0.03 1.48*     BNP Recent Labs  Lab 03/03/18 2317  BNP 526.6*     DDimer No results for input(s): DDIMER in the last 168 hours.    Radiology    No results found.  Cardiac Studies   TTE: 03/04/18  Study Conclusions  - Left ventricle: The cavity size was normal. Wall thickness was   increased in a pattern of moderate LVH. Systolic function was   moderately to  severely reduced. The estimated ejection fraction   was in the range of 30% to 35%. Diffuse hypokinesis. The study is   not technically sufficient to allow evaluation of LV diastolic   function. - Aortic valve: Valve area (VTI): 1.92 cm^2. Valve area (Vmax):   2.04 cm^2. - Mitral valve: There was mild regurgitation. - Left atrium: The atrium was mildly dilated. - Right ventricle: Poorly visualized. Grossly appears enlarged with   decreased function. - Right atrium: Poorly visualized. Grossly appears enlarged. - Atrial septum: No defect or patent foramen ovale was identified. - Pulmonary arteries: Systolic pressure was mildly increased. PA   peak pressure: 36 mm Hg (S). - LVEF is certaintly decreased from Jan 2018 study. Would recommend   limited study with echocontrast to more precisely  evaluate   current current function.  Patient Profile     84 y.o. male with PMH of CAD, HFrEF with normalized EF, HLD, atrial fib/flutter, DVT, prior CVA in 2018who presented with indigestion and chest discomfort. Found to have elevated troponin. Planned for cath.   Assessment & Plan    1. NSTEMI: Trop peaked at 40.85. Remains on IV heparin. No chest pain this morning. Planned for cath today.  -- on IV heparin, ASA, BB, and statin  2. Acute systolic HF: Echo noted at 30-35% this admission. No signs of volume overload on exam. On BB, and consider adding ACEi/ARB post cath given decline in EF.   3. HL: on pravastatin. LDL at 67.   4. Persistent Afib: Rate controlled. On Xarelto prior to admission. This is held for cath. Remains on IV heparin.   Signed, Reino Bellis, NP  03/06/2018, 8:23 AM  Pager # [phone]    I have seen, examined and evaluated the patient this Am along with Reino Bellis, NP-C.  After reviewing all the available data and chart, we discussed the patients laboratory, study & physical findings as well as symptoms in detail. I agree with her findings, examination as well as impression recommendations as per our discussion.    Attending adjustments noted in italics.   Very pleasant, otherwise healthy 84 year old gentleman with chronic A. fib now presenting with appears to be non-STEMI.  Plan is for cardiac catheterization today due to elevated troponin and reduced EF on echo.  As renal function proved to be stable, agree with ACE inhibitor/ARB for reduced EF.  No heart failure symptoms on oral Lasix. Unable to use higher dose of beta-blocker due to bradycardia -would convert to long-acting beta-blocker prior to discharge. Is on low-dose pravastatin at home, pending cath findings, may need to be more aggressive.  Xarelto on hold for cath.  Need to consider if PCI, aspirin Plavix plus Xarelto for 1 month and then stop aspirin.  Performing MD:  [facility]  Procedure: LEFT HEART CATHETERIZATION WITH CORONARY ANGIOGRAPHY AND POSSIBLE PERTAINS CORONARY INTERVENTION  The procedure with Risks/Benefits/Alternatives and Indications was reviewed with the patient and daughter.  All questions were answered.    Risks / Complications include, but not limited to: Death, MI, CVA/TIA, VF/VT (with defibrillation), Bradycardia (need for temporary pacer placement), contrast induced nephropathy, bleeding / bruising / hematoma / pseudoaneurysm, vascular or coronary injury (with possible emergent CT or Vascular Surgery), adverse medication reactions, infection.  Additional risks involving the use of radiation with the possibility of radiation burns and cancer were explained in detail.  The patient (and family) voice understanding and agree to proceed.      Glenetta Hew, M.D., M.S. Interventional Cardiologist  Pager # [phone] Phone # [phone] [address] [place], [place] [address]    For questions or updates, please contact [facility] Please consult [email or web address] for contact info under Cardiology/STEMI.

## 2018-03-06 NOTE — Progress Notes (Signed)
Site area: right groin  Site Prior to Removal:  Level 0  Pressure Applied For 25 MINUTES    Minutes Beginning at 1655  Manual:   Yes.    Patient Status During Pull:  WNL  Post Pull Groin Site:  Level 0  Post Pull Instructions Given:  Yes.    Post Pull Pulses Present:  Yes.    Dressing Applied:  Yes.    Comments:  Tolerated procedure well

## 2018-03-06 NOTE — H&P (View-Only) (Signed)
Progress Note  Patient Name: [name] Date of Encounter: 03/06/2018  Primary Cardiologist: Lauree Chandler, MD  Subjective   Feeling well this morning. No chest pain.   No further chest pain or dyspnea.  Inpatient Medications    Scheduled Meds: . [START ON 03/07/2018] aspirin EC  81 mg Oral Daily  . cycloSPORINE  1 drop Both Eyes BID  . furosemide  40 mg Oral Daily  . loratadine  10 mg Oral Daily  . metoprolol tartrate  12.5 mg Oral BID  . pravastatin  20 mg Oral q1800  . pregabalin  75 mg Oral BID  . sodium chloride flush  3 mL Intravenous Q12H   Continuous Infusions: . sodium chloride    . sodium chloride 1 mL/kg/hr (03/06/18 0617)  . heparin 800 Units/hr (03/05/18 0500)   PRN Meds: sodium chloride, acetaminophen, alum hydroxide-mag trisilicate, fluticasone, guaiFENesin, nitroGLYCERIN, sodium chloride flush   Vital Signs    Vitals:   03/04/18 2050 03/05/18 0406 03/05/18 1454 03/05/18 2054  BP: 124/75 112/77 118/69 117/83  Pulse: 66 69 64 61  Resp: 15   14  Temp: 97.6 F (36.4 C) 97.9 F (36.6 C) 97.7 F (36.5 C) 97.7 F (36.5 C)  TempSrc: Oral Oral Oral Oral  SpO2: 95% 98% 98% 94%  Weight:      Height:        Intake/Output Summary (Last 24 hours) at 03/06/2018 0823 Last data filed at 03/06/2018 0503 Gross per 24 hour  Intake 400.4 ml  Output 100 ml  Net 300.4 ml   Filed Weights   03/03/18 1634 03/04/18 0017  Weight: 79.8 kg 78.1 kg    Telemetry    Afib Rate controlled. - Personally Reviewed  Physical Exam   General: Well developed, well nourished, male appearing in no acute distress. Head: Normocephalic, atraumatic.  Neck: Supple, JVD. Lungs:  Resp regular and unlabored, CTA. Heart: Irreg Irreg, S1, S2, no murmur; no rub. Abdomen: Soft, non-tender, non-distended with normoactive bowel sounds.  Extremities: No clubbing, cyanosis, edema. Distal pedal pulses are 2+ bilaterally. Neuro: Alert and oriented X 3. Moves all  extremities spontaneously. Psych: Normal affect.  Labs    Chemistry Recent Labs  Lab 03/03/18 1642 03/04/18 0639  NA 139 138  K 3.9 3.9  CL 102 104  CO2 27 25  GLUCOSE 218* 147*  BUN 28* 32*  CREATININE 1.33* 1.27*  CALCIUM 9.8 9.2  GFRNONAA 45* 48*  GFRAA 52* 55*  ANIONGAP 10 9     Hematology Recent Labs  Lab 03/04/18 0639 03/05/18 0556 03/06/18 0520  WBC 10.7* 7.6 5.4  RBC 4.81 4.70 4.68  HGB 13.5 13.4 13.3  HCT 41.8 41.1 41.3  MCV 86.9 87.4 88.2  MCH 28.1 28.5 28.4  MCHC 32.3 32.6 32.2  RDW 13.9 14.0 14.1  PLT 107* 98* 95*    Cardiac Enzymes Recent Labs  Lab 03/04/18 0048 03/04/18 0639 03/04/18 1235  TROPONINI 4.86* 14.35* 40.85*    Recent Labs  Lab 03/03/18 1649 03/03/18 2124  TROPIPOC 0.03 1.48*     BNP Recent Labs  Lab 03/03/18 2317  BNP 526.6*     DDimer No results for input(s): DDIMER in the last 168 hours.    Radiology    No results found.  Cardiac Studies   TTE: 03/04/18  Study Conclusions  - Left ventricle: The cavity size was normal. Wall thickness was   increased in a pattern of moderate LVH. Systolic function was   moderately to  severely reduced. The estimated ejection fraction   was in the range of 30% to 35%. Diffuse hypokinesis. The study is   not technically sufficient to allow evaluation of LV diastolic   function. - Aortic valve: Valve area (VTI): 1.92 cm^2. Valve area (Vmax):   2.04 cm^2. - Mitral valve: There was mild regurgitation. - Left atrium: The atrium was mildly dilated. - Right ventricle: Poorly visualized. Grossly appears enlarged with   decreased function. - Right atrium: Poorly visualized. Grossly appears enlarged. - Atrial septum: No defect or patent foramen ovale was identified. - Pulmonary arteries: Systolic pressure was mildly increased. PA   peak pressure: 36 mm Hg (S). - LVEF is certaintly decreased from Jan 2018 study. Would recommend   limited study with echocontrast to more precisely  evaluate   current current function.  Patient Profile     84 y.o. male with PMH of CAD, HFrEF with normalized EF, HLD, atrial fib/flutter, DVT, prior CVA in 2018who presented with indigestion and chest discomfort. Found to have elevated troponin. Planned for cath.   Assessment & Plan    1. NSTEMI: Trop peaked at 40.85. Remains on IV heparin. No chest pain this morning. Planned for cath today.  -- on IV heparin, ASA, BB, and statin  2. Acute systolic HF: Echo noted at 30-35% this admission. No signs of volume overload on exam. On BB, and consider adding ACEi/ARB post cath given decline in EF.   3. HL: on pravastatin. LDL at 67.   4. Persistent Afib: Rate controlled. On Xarelto prior to admission. This is held for cath. Remains on IV heparin.   Signed, Reino Bellis, NP  03/06/2018, 8:23 AM  Pager # [phone]    I have seen, examined and evaluated the patient this Am along with Reino Bellis, NP-C.  After reviewing all the available data and chart, we discussed the patients laboratory, study & physical findings as well as symptoms in detail. I agree with her findings, examination as well as impression recommendations as per our discussion.    Attending adjustments noted in italics.   Very pleasant, otherwise healthy 84 year old gentleman with chronic A. fib now presenting with appears to be non-STEMI.  Plan is for cardiac catheterization today due to elevated troponin and reduced EF on echo.  As renal function proved to be stable, agree with ACE inhibitor/ARB for reduced EF.  No heart failure symptoms on oral Lasix. Unable to use higher dose of beta-blocker due to bradycardia -would convert to long-acting beta-blocker prior to discharge. Is on low-dose pravastatin at home, pending cath findings, may need to be more aggressive.  Xarelto on hold for cath.  Need to consider if PCI, aspirin Plavix plus Xarelto for 1 month and then stop aspirin.  Performing MD:  [facility]  Procedure: LEFT HEART CATHETERIZATION WITH CORONARY ANGIOGRAPHY AND POSSIBLE PERTAINS CORONARY INTERVENTION  The procedure with Risks/Benefits/Alternatives and Indications was reviewed with the patient and daughter.  All questions were answered.    Risks / Complications include, but not limited to: Death, MI, CVA/TIA, VF/VT (with defibrillation), Bradycardia (need for temporary pacer placement), contrast induced nephropathy, bleeding / bruising / hematoma / pseudoaneurysm, vascular or coronary injury (with possible emergent CT or Vascular Surgery), adverse medication reactions, infection.  Additional risks involving the use of radiation with the possibility of radiation burns and cancer were explained in detail.  The patient (and family) voice understanding and agree to proceed.      Glenetta Hew, M.D., M.S. Interventional Cardiologist  Pager # [phone] Phone # [phone] [address] [place], [place] [address]    For questions or updates, please contact [facility] Please consult [email or web address] for contact info under Cardiology/STEMI.

## 2018-03-06 NOTE — Progress Notes (Addendum)
Call received from Telemetry. Pt. had a 2.25 second pause. Asymptomatic. Charge nurse notified. Pt. remains on Heparin drip. Scheduled for Heart Cath today. Will continuo to monitor.

## 2018-03-06 NOTE — Interval H&P Note (Signed)
Cath Lab Visit (complete for each Cath Lab visit)  Clinical Evaluation Leading to the Procedure:   ACS: Yes.    Non-ACS:    Anginal Classification: CCS III  Anti-ischemic medical therapy: Minimal Therapy (1 class of medications)  Non-Invasive Test Results: No non-invasive testing performed  Prior CABG: No previous CABG      History and Physical Interval Note:  03/06/2018 9:14 AM  [name]  has presented today for surgery, with the diagnosis of cp  The various methods of treatment have been discussed with the patient and family. After consideration of risks, benefits and other options for treatment, the patient has consented to  Procedure(s): LEFT HEART CATH AND CORONARY ANGIOGRAPHY (N/A) as a surgical intervention .  The patient's history has been reviewed, patient examined, no change in status, stable for surgery.  I have reviewed the patient's chart and labs.  Questions were answered to the patient's satisfaction.     Quay Burow

## 2018-03-06 NOTE — Progress Notes (Signed)
[facility] for Heparin Indication: chest pain/ACS  Allergies  Allergen Reactions  . Prednisone Other (See Comments)    Does not feel right  . Rosuvastatin Other (See Comments)    Aches   Patient Measurements: Height: 5\' 10"  (177.8 cm) Weight: 172 lb 2.9 oz (78.1 kg) IBW/kg (Calculated) : 73 Heparin Dosing Weight: 79 kg  Vital Signs: Temp: 97.7 F (36.5 C) (08/25 2054) Temp Source: Oral (08/25 2054) BP: 117/83 (08/25 2054) Pulse Rate: 61 (08/25 2054)  Labs: Recent Labs    03/03/18 1642  03/04/18 0048  03/04/18 3354 03/04/18 1235 03/04/18 1611 03/05/18 0556 03/05/18 1833 03/06/18 0520  HGB 14.3   < >  --   --  13.5  --   --  13.4  --  13.3  HCT 44.2   < >  --   --  41.8  --   --  41.1  --  41.3  PLT 107*   < >  --   --  107*  --   --  98*  --  95*  APTT  --   --   --    < > >200*  --  81* 77*  --  82*  LABPROT  --   --   --   --   --   --   --   --  15.6*  --   INR  --   --   --   --   --   --   --   --  1.25  --   HEPARINUNFRC  --   --   --   --  >2.20*  --   --  0.83*  --  0.38  CREATININE 1.33*  --   --   --  1.27*  --   --   --   --   --   TROPONINI  --   --  4.86*  --  14.35* 40.85*  --   --   --   --    < > = values in this interval not displayed.   Estimated Creatinine Clearance: 39.1 mL/min (A) (by C-G formula based on SCr of 1.27 mg/dL (H)).  Assessment:  14 yoM on xarelto PTA (last dose PTA on 8/23) for h/o afib presenting with chest pain, troponin up to 40. Pharmacy consulted to dose IV heparin.   Heparin level this morning is therapeutic at 0.38, aPTT is also therapeutic at 82 - now correlating. Will start following heparin levels only. Hgb 13.3, plt 95 (baseline 105). Remains on 800 units/hr. No s/sx of bleeding.     Goal of Therapy:  aPTT 66-102s Heparin level 0.3-0.7 units/ml Monitor platelets by anticoagulation protocol: Yes   Plan:  Continue heparin infusion at 800 units/hr Daily heparin level/aPTT and  CBC F/u plans for cath  Doylene Canard, PharmD Clinical Pharmacist  Pager: [phone] Phone: [phone] Please check Amion for pharmacy contact number

## 2018-03-07 ENCOUNTER — Encounter (HOSPITAL_COMMUNITY): Payer: Self-pay | Admitting: Cardiology

## 2018-03-07 ENCOUNTER — Telehealth: Payer: Self-pay | Admitting: Cardiology

## 2018-03-07 DIAGNOSIS — E782 Mixed hyperlipidemia: Secondary | ICD-10-CM

## 2018-03-07 DIAGNOSIS — Z955 Presence of coronary angioplasty implant and graft: Secondary | ICD-10-CM

## 2018-03-07 DIAGNOSIS — I10 Essential (primary) hypertension: Secondary | ICD-10-CM

## 2018-03-07 LAB — BASIC METABOLIC PANEL
Anion gap: 10 (ref 5–15)
BUN: 29 mg/dL — AB (ref 8–23)
CO2: 25 mmol/L (ref 22–32)
CREATININE: 1.26 mg/dL — AB (ref 0.61–1.24)
Calcium: 9 mg/dL (ref 8.9–10.3)
Chloride: 106 mmol/L (ref 98–111)
GFR calc Af Amer: 56 mL/min — ABNORMAL LOW (ref >60)
GFR, EST NON AFRICAN AMERICAN: 48 mL/min — AB (ref >60)
GLUCOSE: 105 mg/dL — AB (ref 70–99)
POTASSIUM: 3.6 mmol/L (ref 3.5–5.1)
Sodium: 141 mmol/L (ref 135–145)

## 2018-03-07 LAB — CBC
HCT: 43.6 % (ref 39.0–52.0)
Hemoglobin: 13.8 g/dL (ref 13.0–17.0)
MCH: 27.7 pg (ref 26.0–34.0)
MCHC: 31.7 g/dL (ref 30.0–36.0)
MCV: 87.6 fL (ref 78.0–100.0)
PLATELETS: 104 10*3/uL — AB (ref 150–400)
RBC: 4.98 MIL/uL (ref 4.22–5.81)
RDW: 13.7 % (ref 11.5–15.5)
WBC: 6.9 10*3/uL (ref 4.0–10.5)

## 2018-03-07 MED ORDER — RIVAROXABAN 15 MG PO TABS
15.0000 mg | ORAL_TABLET | Freq: Every day | ORAL | Status: DC
  Administered 2018-03-07: 15 mg via ORAL
  Filled 2018-03-07: qty 1

## 2018-03-07 MED ORDER — RIVAROXABAN 15 MG PO TABS: 15.0000 mg | ORAL_TABLET | Freq: Every day | ORAL | 6 refills | Status: DC

## 2018-03-07 MED ORDER — LOSARTAN POTASSIUM 25 MG PO TABS: 25.0000 mg | ORAL_TABLET | Freq: Every day | ORAL | 2 refills | Status: DC

## 2018-03-07 MED ORDER — ASPIRIN 81 MG PO TBEC: 81.0000 mg | DELAYED_RELEASE_TABLET | Freq: Every day | ORAL | Status: DC

## 2018-03-07 MED ORDER — CLOPIDOGREL BISULFATE 75 MG PO TABS: 75.0000 mg | ORAL_TABLET | Freq: Every day | ORAL | 1 refills | Status: DC

## 2018-03-07 NOTE — Telephone Encounter (Signed)
**Note De-Identified  Obfuscation** The pt is being discharged today. Will call him tomorrow.

## 2018-03-07 NOTE — Discharge Instructions (Addendum)
Information on my medicine - XARELTO (Rivaroxaban)  Why was Xarelto prescribed for you? Xarelto was prescribed for you to reduce the risk of a blood clot forming that can cause a stroke if you have a medical condition called atrial fibrillation (a type of irregular heartbeat).  What do you need to know about xarelto ? Take your Xarelto ONCE DAILY at the same time every day with your evening meal. If you have difficulty swallowing the tablet whole, you may crush it and mix in applesauce just prior to taking your dose.  Take Xarelto exactly as prescribed by your doctor and DO NOT stop taking Xarelto without talking to the doctor who prescribed the medication.  Stopping without other stroke prevention medication to take the place of Xarelto may increase your risk of developing a clot that causes a stroke.  Refill your prescription before you run out.  After discharge, you should have regular check-up appointments with your healthcare provider that is prescribing your Xarelto.  In the future your dose may need to be changed if your kidney function or weight changes by a significant amount.  What do you do if you miss a dose? If you are taking Xarelto ONCE DAILY and you miss a dose, take it as soon as you remember on the same day then continue your regularly scheduled once daily regimen the next day. Do not take two doses of Xarelto at the same time or on the same day.   Important Safety Information A possible side effect of Xarelto is bleeding. You should call your healthcare provider right away if you experience any of the following: ? Bleeding from an injury or your nose that does not stop. ? Unusual colored urine (red or dark brown) or unusual colored stools (red or black). ? Unusual bruising for unknown reasons. ? A serious fall or if you hit your head (even if there is no bleeding).  Some medicines may interact with Xarelto and might increase your risk of bleeding while on  Xarelto. To help avoid this, consult your healthcare provider or pharmacist prior to using any new prescription or non-prescription medications, including herbals, vitamins, non-steroidal anti-inflammatory drugs (NSAIDs) and supplements.  This website has more information on Xarelto: [email or web address]  - - - - - - - - - - - - - - - - - - - - - - - - - - - - - - - - - - - - - - - - - - - - - - - - - - - - - - - - - Information about your medication: Plavix (anti-platelet agent)  Generic Name (Brand): clopidogrel (Plavix), once daily medication  PURPOSE: You are taking this medication along with aspirin to lower your chance of having a heart attack, stroke, or blood clots in your heart stent. These can be fatal. Brilinta and aspirin help prevent platelets from sticking together and forming a clot that can block an artery or your stent.   Common SIDE EFFECTS you may experience include: bruising or bleeding more easily, shortness of breath  Do not stop taking PLAVIX without talking to the doctor who prescribes it for you. People who are treated with a stent and stop taking Plavix too soon, have a higher risk of getting a blood clot in the stent, having a heart attack, or dying. If you stop Plavix because of bleeding, or for other reasons, your risk of a heart attack or stroke may increase.   Tell all of your  doctors and dentists that you are taking Plavix. They should talk to the doctor who prescribed Brilinta for you before you have any surgery or invasive procedure.   Contact your health care provider if you experience: severe or uncontrollable bleeding, pink/red/brown urine, vomiting blood or vomit that looks like "coffee grounds", red or black stools (looks like tar), coughing up blood or blood clots ----------------------------------------------------------------------------------------------------------------------

## 2018-03-07 NOTE — Progress Notes (Signed)
CARDIAC REHAB PHASE I   PRE:  Rate/Rhythm: 84 Afib  BP:  Sitting: 92/51      SaO2: 97 RA  MODE:  Ambulation: 250 ft    107 peak HR  POST:  Rate/Rhythm: 88 Afib  BP:  Sitting: 142/75    SaO2: 97 RA   Pt ambulated 264ft assist of 1 with gait belt and cane. Pt walks hunched over which he states is caused by his spine procedure he had done last week. Pt denies CP or SOB, and states he feels much better than previous to PCI. Pt and daughter educated on importance of ASA, Plavix, and NTG. Stent card and MI book at bedside, reviewed with pt. Pt given heart healthy and low sodium diets. CHF booklet given and reviewed. Reviewed restrictions and exercise guidelines. Will refer to CRP II [place].   [id number] Rufina Falco, RN BSN 03/07/2018 9:52 AM

## 2018-03-07 NOTE — Discharge Summary (Signed)
Discharge Summary    Patient ID: [name],  MRN: [id number], DOB/AGE: Dec 08, 1923 84 y.o.  Admit date: 03/03/2018 Discharge date: 03/07/2018  Primary Care Provider: Leeanne Rio Primary Cardiologist: Dr. Angelena Form   Discharge Diagnoses    Active Problems:   NSTEMI (non-ST elevated myocardial infarction) ([place])   HTN (hypertension)   Acute on chronic combined systolic and diastolic congestive heart failure (HCC)   Hyperlipidemia   Persistent atrial fibrillation (HCC)   Allergies Allergies  Allergen Reactions  . Prednisone Other (See Comments)    Does not feel right  . Rosuvastatin Other (See Comments)    Aches    Diagnostic Studies/Procedures    Cath: 03/06/18   Mid RCA to Dist RCA lesion is 100% stenosed.  Previously placed Prox LAD stent (unknown type) is widely patent.  Previously placed Prox LAD to Mid LAD stent (unknown type) is widely patent.  Ost 1st Mrg lesion is 99% stenosed.  A drug-eluting stent was successfully placed.  Post intervention, there is a 0% residual stenosis.   IMPRESSION: [name] has patent proximal and mid LAD bare-metal stents placed by Dr. Lia Foyer 03/21/2008.  He has a chronically occluded RCA with bidirectional collaterals which is similar to his cath 10 years ago.  His infarct-related artery was a 99% circumflex obtuse marginal branch on the band with a highly tortuous proximal most segment which was successfully stented with a 3 mm x 20 mill meter long Synergy drug-eluting stent with an excellent angiographic result.  The patient tolerated the procedure well.  The guidewire and catheter were removed and the sheath was secured.  Patient will be treated with "triple therapy" with low-dose aspirin, Plavix and Xarelto for 1 month after which aspirin can be discontinued.  He left the lab in stable condition.  [name]. MD, [facility] _____________   History of Present Illness     84 y.o. male with a history of  CAD,H FrEF with normalized EF,  HLD, atrial fib/flutter, DVT, prior CVA in 2018who presented with suspected NSTEMI.   The patient has a history of multiple cardiac comorbidities and follows with Dr. Angelena Form, who last saw the patient in clinic 3 weeks prior to admission. At that visit, he was doing well without any cardiac symptoms.   He underwent a spinal nerve ablation procedure on 03/02/18 with othopedic surgery. He reportedly tolerated the procedure without issue. He was restarted on his xarelto that evening.  He presented to the ED with his daughter (who contributes to history) following an episode of dyspnea. He reported that he was doing well and then had an episode of dyspnea while at rest. It resolved spontaneously. He attributed it to eating a large lunch, as he also had some acid reflux. He denied any associated chest pain or LE edema or other symptoms. He stated that he had chronic, mild DOE that was at his baseline and generally worsens when he carries too much fluid.   In the ED, SBP 130-140s and HR 60-80s. ECG showed rate controlled AF with RBBB and no acute ischemic changes. WBC 10.9, Cr 1.33 (baseline). Initial troponin negative, but subsequent measurement 1.48. CXR showed possible R basilar atelectasis vs. infiltrate. He was started on heparin gtt. Orthopedics called by ED and stated that heparin is safe at this time. He was admitted with plans to undergo cath.   Hospital Course     Troponin peaked at 40.85. Remained on IV heparin. Underwent cath noted above with PCI/DESx1 to the  OM. Previously placed stents in the LAD noted to be patent. Given his need for [facility], he will be on triple therapy with ASA/plavix and Xarelto for one month, then plan to stop ASA. Post cath labs were stable. No complications noted post cath. Echo this admission noted a decline in his EF to 30-35% with diffuse hypokinesis. On low dose BB prior to admission, and unable to titrate 2/2 to bradycardia. Did add  low dose losartan prior to discharge. Discussed his statin, bu reported severe intolerance to crestor in the past, unable to walk. Given his LDL is 67 will continue on pravastatin. Worked well with cardiac rehab without recurrent chest pain.   General: Well developed, well nourished, male appearing in no acute distress. Head: Normocephalic, atraumatic.  Neck: Supple without bruits, JVD. Lungs:  Resp regular and unlabored, CTA. Heart: Irreg Irreg, S1, S2, no S3, S4, or murmur; no rub. Abdomen: Soft, non-tender, non-distended with normoactive bowel sounds. No hepatomegaly. No rebound/guarding. No obvious abdominal masses. Extremities: No clubbing, cyanosis, edema. Distal pedal pulses are 2+ bilaterally. R femoral cath site stable without bruising or hematoma Neuro: Alert and oriented X 3. Moves all extremities spontaneously. Psych: Normal affect.  [name] was seen by Dr. Ellyn Hack and determined stable for discharge home. Follow up in the office has been arranged. Medications are listed below.   _____________  Discharge Vitals Blood pressure 117/77, pulse 79, temperature 97.7 F (36.5 C), resp. rate (!) 21, height 5\' 10"  (1.778 m), weight 72.5 kg, SpO2 97 %.  Filed Weights   03/03/18 1634 03/04/18 0017 03/07/18 0642  Weight: 79.8 kg 78.1 kg 72.5 kg    Labs & Radiologic Studies    CBC Recent Labs    03/06/18 0520 03/07/18 0329  WBC 5.4 6.9  HGB 13.3 13.8  HCT 41.3 43.6  MCV 88.2 87.6  PLT 95* 938*   Basic Metabolic Panel Recent Labs    03/07/18 0329  NA 141  K 3.6  CL 106  CO2 25  GLUCOSE 105*  BUN 29*  CREATININE 1.26*  CALCIUM 9.0   Liver Function Tests No results for input(s): AST, ALT, ALKPHOS, BILITOT, PROT, ALBUMIN in the last 72 hours. No results for input(s): LIPASE, AMYLASE in the last 72 hours. Cardiac Enzymes Recent Labs    03/04/18 1235  TROPONINI 40.85*   BNP Invalid input(s): POCBNP D-Dimer No results for input(s): DDIMER in the last  72 hours. Hemoglobin A1C No results for input(s): HGBA1C in the last 72 hours. Fasting Lipid Panel No results for input(s): CHOL, HDL, LDLCALC, TRIG, CHOLHDL, LDLDIRECT in the last 72 hours. Thyroid Function Tests No results for input(s): TSH, T4TOTAL, T3FREE, THYROIDAB in the last 72 hours.  Invalid input(s): FREET3 _____________  Dg Chest 2 View  Result Date: 03/03/2018 CLINICAL DATA:  Shortness of breath since 1500 hours, history coronary disease post MI, hypertension EXAM: CHEST - 2 VIEW COMPARISON:  09/13/2017 FINDINGS: Enlargement of cardiac silhouette. Mediastinal contours and pulmonary vascularity normal. Atherosclerotic calcifications aorta. RIGHT basilar atelectasis versus infiltrate. Remaining lungs clear. No pleural effusion or pneumothorax. Bones unremarkable. IMPRESSION: Enlargement of cardiac silhouette. RIGHT basilar atelectasis versus infiltrate. Electronically Signed   By: Lavonia Dana M.D.   On: 03/03/2018 18:06   Disposition   Pt is being discharged home today in good condition.  Follow-up Plans & Appointments    Follow-up Information    Imogene Burn, PA-C Follow up on 03/17/2018.   Specialty:  Cardiology Why:  at 9:30am for your  follow up appt.  Contact information: [address] [place] [id number] [phone]          Discharge Instructions    (HEART FAILURE PATIENTS) Call MD:  Anytime you have any of the following symptoms: 1) 3 pound weight gain in 24 hours or 5 pounds in 1 week 2) shortness of breath, with or without a dry hacking cough 3) swelling in the hands, feet or stomach 4) if you have to sleep on extra pillows at night in order to breathe.   Complete by:  As directed    AMB Referral to Cardiac Rehabilitation - Phase II   Complete by:  As directed    Diagnosis:  NSTEMI   Amb Referral to Cardiac Rehabilitation   Complete by:  As directed    Diagnosis:   Coronary Stents NSTEMI     Call MD for:  redness, tenderness, or  signs of infection (pain, swelling, redness, odor or green/yellow discharge around incision site)   Complete by:  As directed    Diet - low sodium heart healthy   Complete by:  As directed    Discharge instructions   Complete by:  As directed    Groin Site Care Refer to this sheet in the next few weeks. These instructions provide you with information on caring for yourself after your procedure. Your caregiver may also give you more specific instructions. Your treatment has been planned according to current medical practices, but problems sometimes occur. Call your caregiver if you have any problems or questions after your procedure. HOME CARE INSTRUCTIONS You may shower 24 hours after the procedure. Remove the bandage (dressing) and gently wash the site with plain soap and water. Gently pat the site dry.  Do not apply powder or lotion to the site.  Do not sit in a bathtub, swimming pool, or whirlpool for 5 to 7 days.  No bending, squatting, or lifting anything over 10 pounds (4.5 kg) as directed by your caregiver.  Inspect the site at least twice daily.  Do not drive home if you are discharged the same day of the procedure. Have someone else drive you.  You may drive 24 hours after the procedure unless otherwise instructed by your caregiver.  What to expect: Any bruising will usually fade within 1 to 2 weeks.  Blood that collects in the tissue (hematoma) may be painful to the touch. It should usually decrease in size and tenderness within 1 to 2 weeks.  SEEK IMMEDIATE MEDICAL CARE IF: You have unusual pain at the groin site or down the affected leg.  You have redness, warmth, swelling, or pain at the groin site.  You have drainage (other than a small amount of blood on the dressing).  You have chills.  You have a fever or persistent symptoms for more than 72 hours.  You have a fever and your symptoms suddenly get worse.  Your leg becomes pale, cool, tingly, or numb.  You have heavy  bleeding from the site. Hold pressure on the site. [name]  PLEASE DO NOT MISS ANY DOSES OF YOUR PLAVIX!!!!! Also keep a log of you blood pressures and bring back to your follow up appt. Please call the office with any questions.   Patients taking blood thinners should generally stay away from medicines like ibuprofen, Advil, Motrin, naproxen, and Aleve due to risk of stomach bleeding. You may take Tylenol as directed or talk to your primary doctor about alternatives.   Increase activity  slowly   Complete by:  As directed        Discharge Medications     Medication List    STOP taking these medications   guaiFENesin 600 MG 12 hr tablet Commonly known as:  MUCINEX     TAKE these medications   aspirin 81 MG EC tablet Take 1 tablet (81 mg total) by mouth daily.   clopidogrel 75 MG tablet Commonly known as:  PLAVIX Take 1 tablet (75 mg total) by mouth daily with breakfast. Start taking on:  03/08/2018   fluticasone 50 MCG/ACT nasal spray Commonly known as:  FLONASE Place 2 sprays into both nostrils daily as needed for allergies or rhinitis.   furosemide 20 MG tablet Commonly known as:  LASIX Take 2 tablets (40 mg total) by mouth daily.   HYDROcodone-acetaminophen 5-325 MG tablet Commonly known as:  NORCO/VICODIN Take 1 tablet by mouth every 4 (four) hours as needed. What changed:  reasons to take this   loratadine 10 MG tablet Commonly known as:  CLARITIN Take 10 mg by mouth daily.   losartan 25 MG tablet Commonly known as:  COZAAR Take 1 tablet (25 mg total) by mouth daily.   methocarbamol 500 MG tablet Commonly known as:  ROBAXIN Take 500 mg by mouth every 8 (eight) hours as needed for muscle spasms.   metoprolol tartrate 25 MG tablet Commonly known as:  LOPRESSOR TAKE 1/2 TABLET BY MOUTH 2 TIMES DAILY What changed:  See the new instructions.   nitroGLYCERIN 0.4 MG SL tablet Commonly known as:  NITROSTAT Place 1 tablet (0.4 mg total) under the tongue every 5  (five) minutes as needed for chest pain.   potassium chloride 10 MEQ tablet Commonly known as:  K-DUR Take 1 tablet (10 mEq total) by mouth daily.   pravastatin 20 MG tablet Commonly known as:  PRAVACHOL TAKE 1 TABLET (20 MG TOTAL) BY MOUTH DAILY AT 6 PM.   pregabalin 75 MG capsule Commonly known as:  LYRICA Take 1 capsule (75 mg total) by mouth 2 (two) times daily.   RED YEAST RICE PO Take 1,200 mg by mouth daily.   Rivaroxaban 15 MG Tabs tablet Commonly known as:  XARELTO Take 1 tablet (15 mg total) by mouth daily with supper.        Acute coronary syndrome (MI, NSTEMI, STEMI, etc) this admission?: Yes.     AHA/ACC Clinical Performance & Quality Measures: 1. Aspirin prescribed? - Yes 2. ADP Receptor Inhibitor (Plavix/Clopidogrel, Brilinta/Ticagrelor or Effient/Prasugrel) prescribed (includes medically managed patients)? - Yes 3. Beta Blocker prescribed? - Yes 4. High Intensity Statin (Lipitor 40-80mg  or Crestor 20-40mg ) prescribed? - No - intolerant, on pravastatin, LDL 67 5. EF assessed during THIS hospitalization? - Yes 6. For EF <40%, was ACEI/ARB prescribed? - Yes 7. For EF <40%, Aldosterone Antagonist (Spironolactone or Eplerenone) prescribed? - No - Reason:  consider as outpatient 8. Cardiac Rehab Phase II ordered (Included Medically managed Patients)? - Yes    Outstanding Labs/Studies   BMET at follow up appt.   Duration of Discharge Encounter   Greater than 30 minutes including physician time.  Signed, Reino Bellis NP-C 03/07/2018, 10:00 AM

## 2018-03-07 NOTE — Telephone Encounter (Signed)
New Message    Patient has a TOC scheduled for 09/06 with Ellen Henri.

## 2018-03-07 NOTE — Care Management Important Message (Signed)
Important Message  Patient Details  Name: [name] MRN: [id number] Date of Birth: [birth year]   Medicare Important Message Given:  Yes    Alfrieda Tarry P Tivon Lemoine 03/07/2018, 1:06 PM

## 2018-03-08 NOTE — Telephone Encounter (Signed)
**Note De-Identified  Obfuscation** Patient contacted regarding discharge from [facility] on 03/07/18.  Patient understands to follow up with provider Gerre Pebbles, PA-c on 03/17/18 at 9:30 at [facility] [place]. Patient understands discharge instructions? Yes Patient understands medications and regiment? Yes Patient understands to bring all medications to this visit? Yes  The pt came to the phone and gave permission for me to speak with his daughter [name] on his behalf. [name] states that the pt is "Doing Great" and that he has no complaints at all at this time.

## 2018-03-16 ENCOUNTER — Encounter: Payer: Self-pay | Admitting: Cardiology

## 2018-03-16 ENCOUNTER — Telehealth: Payer: Self-pay

## 2018-03-16 ENCOUNTER — Ambulatory Visit: Payer: Medicare Other | Admitting: Cardiology

## 2018-03-16 NOTE — Telephone Encounter (Signed)
Patient was a walk in with our office, his daughter brought him in worried about medication changes. The patient had a cath on 8/26. On 8/28 the patient had irritability, confusion, peripheral vision loss on left side sporadically. On the night of 8/28, the patient spiked a fever of 101.3 F and later on it returned to 92 F. The patient had an increase in his leg weakness. He had dry mouth, but advised he was properly hydrating. Spoke with Leanor Kail, PA, who talked with Dr. Angelena Form about the issue. Dr. Angelena Form advised he could go to the ED to rule out stroke, wait for appointment with Lyda Jester, PA, or go to his primary care doctor. The patient accepted the instructions and expressed understanding.

## 2018-03-17 ENCOUNTER — Encounter (HOSPITAL_COMMUNITY): Payer: Self-pay | Admitting: Emergency Medicine

## 2018-03-17 ENCOUNTER — Ambulatory Visit (INDEPENDENT_AMBULATORY_CARE_PROVIDER_SITE_OTHER): Payer: Medicare Other | Admitting: Cardiology

## 2018-03-17 ENCOUNTER — Other Ambulatory Visit: Payer: Self-pay

## 2018-03-17 ENCOUNTER — Encounter: Payer: Self-pay | Admitting: Cardiology

## 2018-03-17 ENCOUNTER — Inpatient Hospital Stay (HOSPITAL_COMMUNITY)
Admission: EM | Admit: 2018-03-17 | Discharge: 2018-03-22 | DRG: 378 | Disposition: A | Discharge status: Home / Self Care | Payer: Medicare Other | Source: Ambulatory Visit | Attending: Family Medicine | Admitting: Family Medicine

## 2018-03-17 ENCOUNTER — Telehealth: Payer: Self-pay | Admitting: Cardiology

## 2018-03-17 VITALS — BP 122/56 | HR 68 | Ht 70.0 in | Wt 166.0 lb

## 2018-03-17 DIAGNOSIS — N183 Chronic kidney disease, stage 3 (moderate): Secondary | ICD-10-CM | POA: Diagnosis present

## 2018-03-17 DIAGNOSIS — Z888 Allergy status to other drugs, medicaments and biological substances status: Secondary | ICD-10-CM

## 2018-03-17 DIAGNOSIS — I5032 Chronic diastolic (congestive) heart failure: Secondary | ICD-10-CM | POA: Diagnosis not present

## 2018-03-17 DIAGNOSIS — R7303 Prediabetes: Secondary | ICD-10-CM | POA: Diagnosis present

## 2018-03-17 DIAGNOSIS — I5022 Chronic systolic (congestive) heart failure: Secondary | ICD-10-CM | POA: Diagnosis present

## 2018-03-17 DIAGNOSIS — Z79899 Other long term (current) drug therapy: Secondary | ICD-10-CM

## 2018-03-17 DIAGNOSIS — I481 Persistent atrial fibrillation: Secondary | ICD-10-CM | POA: Diagnosis present

## 2018-03-17 DIAGNOSIS — I25709 Atherosclerosis of coronary artery bypass graft(s), unspecified, with unspecified angina pectoris: Secondary | ICD-10-CM | POA: Diagnosis not present

## 2018-03-17 DIAGNOSIS — K2900 Acute gastritis without bleeding: Secondary | ICD-10-CM

## 2018-03-17 DIAGNOSIS — K922 Gastrointestinal hemorrhage, unspecified: Secondary | ICD-10-CM

## 2018-03-17 DIAGNOSIS — D62 Acute posthemorrhagic anemia: Secondary | ICD-10-CM

## 2018-03-17 DIAGNOSIS — Z8672 Personal history of thrombophlebitis: Secondary | ICD-10-CM

## 2018-03-17 DIAGNOSIS — X58XXXA Exposure to other specified factors, initial encounter: Secondary | ICD-10-CM | POA: Diagnosis present

## 2018-03-17 DIAGNOSIS — T45515A Adverse effect of anticoagulants, initial encounter: Secondary | ICD-10-CM | POA: Diagnosis present

## 2018-03-17 DIAGNOSIS — E785 Hyperlipidemia, unspecified: Secondary | ICD-10-CM | POA: Diagnosis present

## 2018-03-17 DIAGNOSIS — K2901 Acute gastritis with bleeding: Secondary | ICD-10-CM | POA: Diagnosis not present

## 2018-03-17 DIAGNOSIS — Z7902 Long term (current) use of antithrombotics/antiplatelets: Secondary | ICD-10-CM

## 2018-03-17 DIAGNOSIS — I2510 Atherosclerotic heart disease of native coronary artery without angina pectoris: Secondary | ICD-10-CM | POA: Diagnosis present

## 2018-03-17 DIAGNOSIS — R42 Dizziness and giddiness: Secondary | ICD-10-CM | POA: Diagnosis not present

## 2018-03-17 DIAGNOSIS — I130 Hypertensive heart and chronic kidney disease with heart failure and stage 1 through stage 4 chronic kidney disease, or unspecified chronic kidney disease: Secondary | ICD-10-CM | POA: Diagnosis present

## 2018-03-17 DIAGNOSIS — R011 Cardiac murmur, unspecified: Secondary | ICD-10-CM | POA: Diagnosis present

## 2018-03-17 DIAGNOSIS — R410 Disorientation, unspecified: Secondary | ICD-10-CM

## 2018-03-17 DIAGNOSIS — R350 Frequency of micturition: Secondary | ICD-10-CM

## 2018-03-17 DIAGNOSIS — I10 Essential (primary) hypertension: Secondary | ICD-10-CM

## 2018-03-17 DIAGNOSIS — I482 Chronic atrial fibrillation: Secondary | ICD-10-CM | POA: Diagnosis not present

## 2018-03-17 DIAGNOSIS — I739 Peripheral vascular disease, unspecified: Secondary | ICD-10-CM | POA: Diagnosis present

## 2018-03-17 DIAGNOSIS — R5383 Other fatigue: Secondary | ICD-10-CM

## 2018-03-17 DIAGNOSIS — Z955 Presence of coronary angioplasty implant and graft: Secondary | ICD-10-CM | POA: Diagnosis not present

## 2018-03-17 DIAGNOSIS — R634 Abnormal weight loss: Secondary | ICD-10-CM | POA: Diagnosis present

## 2018-03-17 DIAGNOSIS — I480 Paroxysmal atrial fibrillation: Secondary | ICD-10-CM | POA: Diagnosis present

## 2018-03-17 DIAGNOSIS — D500 Iron deficiency anemia secondary to blood loss (chronic): Secondary | ICD-10-CM | POA: Diagnosis present

## 2018-03-17 DIAGNOSIS — T39015A Adverse effect of aspirin, initial encounter: Secondary | ICD-10-CM | POA: Diagnosis present

## 2018-03-17 DIAGNOSIS — Z86718 Personal history of other venous thrombosis and embolism: Secondary | ICD-10-CM

## 2018-03-17 DIAGNOSIS — Z6823 Body mass index (BMI) 23.0-23.9, adult: Secondary | ICD-10-CM

## 2018-03-17 DIAGNOSIS — N179 Acute kidney failure, unspecified: Secondary | ICD-10-CM | POA: Diagnosis present

## 2018-03-17 DIAGNOSIS — Z7901 Long term (current) use of anticoagulants: Secondary | ICD-10-CM | POA: Diagnosis not present

## 2018-03-17 DIAGNOSIS — D693 Immune thrombocytopenic purpura: Secondary | ICD-10-CM | POA: Diagnosis present

## 2018-03-17 DIAGNOSIS — I4510 Unspecified right bundle-branch block: Secondary | ICD-10-CM | POA: Diagnosis present

## 2018-03-17 DIAGNOSIS — T45525A Adverse effect of antithrombotic drugs, initial encounter: Secondary | ICD-10-CM | POA: Diagnosis present

## 2018-03-17 DIAGNOSIS — Z8719 Personal history of other diseases of the digestive system: Secondary | ICD-10-CM | POA: Diagnosis present

## 2018-03-17 DIAGNOSIS — E7800 Pure hypercholesterolemia, unspecified: Secondary | ICD-10-CM | POA: Diagnosis present

## 2018-03-17 DIAGNOSIS — Z96653 Presence of artificial knee joint, bilateral: Secondary | ICD-10-CM | POA: Diagnosis present

## 2018-03-17 DIAGNOSIS — R2681 Unsteadiness on feet: Secondary | ICD-10-CM

## 2018-03-17 DIAGNOSIS — I4891 Unspecified atrial fibrillation: Secondary | ICD-10-CM | POA: Diagnosis not present

## 2018-03-17 DIAGNOSIS — Z7982 Long term (current) use of aspirin: Secondary | ICD-10-CM

## 2018-03-17 DIAGNOSIS — Z79891 Long term (current) use of opiate analgesic: Secondary | ICD-10-CM

## 2018-03-17 DIAGNOSIS — I252 Old myocardial infarction: Secondary | ICD-10-CM

## 2018-03-17 DIAGNOSIS — N401 Enlarged prostate with lower urinary tract symptoms: Secondary | ICD-10-CM | POA: Diagnosis present

## 2018-03-17 DIAGNOSIS — I959 Hypotension, unspecified: Secondary | ICD-10-CM | POA: Diagnosis present

## 2018-03-17 DIAGNOSIS — Z8673 Personal history of transient ischemic attack (TIA), and cerebral infarction without residual deficits: Secondary | ICD-10-CM

## 2018-03-17 DIAGNOSIS — I2583 Coronary atherosclerosis due to lipid rich plaque: Secondary | ICD-10-CM

## 2018-03-17 HISTORY — DX: Personal history of other diseases of the digestive system: Z87.19

## 2018-03-17 LAB — HEPATIC FUNCTION PANEL
ALBUMIN: 3.9 g/dL (ref 3.2–4.6)
ALK PHOS: 82 IU/L (ref 39–117)
ALT: 32 IU/L (ref 0–44)
AST: 24 IU/L (ref 0–40)
BILIRUBIN TOTAL: 0.9 mg/dL (ref 0.0–1.2)
BILIRUBIN, DIRECT: 0.37 mg/dL (ref 0.00–0.40)
Total Protein: 6.1 g/dL (ref 6.0–8.5)

## 2018-03-17 LAB — CBC
HEMATOCRIT: 29.1 % — AB (ref 39.0–52.0)
HEMOGLOBIN: 9.2 g/dL — AB (ref 13.0–17.0)
MCH: 28.8 pg (ref 26.0–34.0)
MCHC: 31.6 g/dL (ref 30.0–36.0)
MCV: 91.2 fL (ref 78.0–100.0)
Platelets: 120 10*3/uL — ABNORMAL LOW (ref 150–400)
RBC: 3.19 MIL/uL — ABNORMAL LOW (ref 4.22–5.81)
RDW: 15.1 % (ref 11.5–15.5)
WBC: 9.4 10*3/uL (ref 4.0–10.5)

## 2018-03-17 LAB — CBC WITH DIFFERENTIAL/PLATELET
Basophils Absolute: 0 10*3/uL (ref 0.0–0.2)
Basos: 0 %
EOS (ABSOLUTE): 0.1 10*3/uL (ref 0.0–0.4)
EOS: 1 %
HEMATOCRIT: 28.6 % — AB (ref 37.5–51.0)
Hemoglobin: 9.3 g/dL — ABNORMAL LOW (ref 13.0–17.7)
LYMPHS: 17 %
Lymphocytes Absolute: 1.7 10*3/uL (ref 0.7–3.1)
MCH: 27.9 pg (ref 26.6–33.0)
MCHC: 32.5 g/dL (ref 31.5–35.7)
MCV: 86 fL (ref 79–97)
MONOCYTES: 8 %
Monocytes Absolute: 0.8 10*3/uL (ref 0.1–0.9)
NEUTROS ABS: 7.2 10*3/uL — AB (ref 1.4–7.0)
Neutrophils: 74 %
Platelets: 131 10*3/uL — ABNORMAL LOW (ref 150–450)
RBC: 3.33 x10E6/uL — AB (ref 4.14–5.80)
RDW: 15.6 % — ABNORMAL HIGH (ref 12.3–15.4)
WBC: 9.9 10*3/uL (ref 3.4–10.8)

## 2018-03-17 LAB — BASIC METABOLIC PANEL
Anion gap: 9 (ref 5–15)
BUN/Creatinine Ratio: 59 — ABNORMAL HIGH (ref 10–24)
BUN: 115 mg/dL — AB (ref 10–36)
BUN: 113 mg/dL — AB (ref 8–23)
CHLORIDE: 108 mmol/L (ref 98–111)
CO2: 25 mmol/L (ref 20–29)
CO2: 24 mmol/L (ref 22–32)
CREATININE: 1.94 mg/dL — AB (ref 0.76–1.27)
Calcium: 9.6 mg/dL (ref 8.6–10.2)
Calcium: 9.1 mg/dL (ref 8.9–10.3)
Chloride: 106 mmol/L (ref 96–106)
Creatinine, Ser: 1.99 mg/dL — ABNORMAL HIGH (ref 0.61–1.24)
GFR calc Af Amer: 32 mL/min — ABNORMAL LOW (ref >60)
GFR calc non Af Amer: 28 mL/min — ABNORMAL LOW (ref >60)
GFR, EST AFRICAN AMERICAN: 34 mL/min/{1.73_m2} — AB (ref >59)
GFR, EST NON AFRICAN AMERICAN: 29 mL/min/{1.73_m2} — AB (ref >59)
GLUCOSE: 150 mg/dL — AB (ref 70–99)
Glucose: 119 mg/dL — ABNORMAL HIGH (ref 65–99)
POTASSIUM: 4.5 mmol/L (ref 3.5–5.2)
POTASSIUM: 4.3 mmol/L (ref 3.5–5.1)
Sodium: 142 mmol/L (ref 134–144)
Sodium: 141 mmol/L (ref 135–145)

## 2018-03-17 LAB — URINALYSIS, ROUTINE W REFLEX MICROSCOPIC
Bilirubin, UA: NEGATIVE
Glucose, UA: NEGATIVE
Ketones, UA: NEGATIVE
Nitrite, UA: NEGATIVE
PH UA: 5.5 (ref 5.0–7.5)
Protein, UA: NEGATIVE
RBC, UA: NEGATIVE
Specific Gravity, UA: 1.015 (ref 1.005–1.030)
UUROB: 0.2 mg/dL (ref 0.2–1.0)

## 2018-03-17 LAB — SAMPLE TO BLOOD BANK

## 2018-03-17 LAB — MICROSCOPIC EXAMINATION: RBC, UA: NONE SEEN /hpf (ref 0–2)

## 2018-03-17 LAB — PREPARE RBC (CROSSMATCH)

## 2018-03-17 LAB — POC OCCULT BLOOD, ED: FECAL OCCULT BLD: POSITIVE — AB

## 2018-03-17 MED ORDER — ACETAMINOPHEN 325 MG PO TABS: 650.0000 mg | ORAL_TABLET | Freq: Four times a day (QID) | ORAL | Status: DC | PRN

## 2018-03-17 MED ORDER — LORATADINE 10 MG PO TABS
10.0000 mg | ORAL_TABLET | Freq: Every day | ORAL | Status: DC
  Administered 2018-03-18 – 2018-03-22 (×5): 10 mg via ORAL
  Filled 2018-03-17 (×5): qty 1

## 2018-03-17 MED ORDER — CLOPIDOGREL BISULFATE 75 MG PO TABS
75.0000 mg | ORAL_TABLET | Freq: Every day | ORAL | Status: DC
  Administered 2018-03-18 – 2018-03-22 (×5): 75 mg via ORAL
  Filled 2018-03-17 (×5): qty 1

## 2018-03-17 MED ORDER — PREGABALIN 75 MG PO CAPS
75.0000 mg | ORAL_CAPSULE | Freq: Two times a day (BID) | ORAL | Status: DC
  Administered 2018-03-17 – 2018-03-22 (×10): 75 mg via ORAL
  Filled 2018-03-17 (×11): qty 1

## 2018-03-17 MED ORDER — SODIUM CHLORIDE 0.9 % IV BOLUS
500.0000 mL | Freq: Once | INTRAVENOUS | Status: AC
  Administered 2018-03-17: 500 mL via INTRAVENOUS

## 2018-03-17 MED ORDER — SODIUM CHLORIDE 0.9% IV SOLUTION
Freq: Once | INTRAVENOUS | Status: AC
  Administered 2018-03-17: 18:00:00 via INTRAVENOUS

## 2018-03-17 MED ORDER — SODIUM CHLORIDE 0.9 % IV SOLN
8.0000 mg/h | INTRAVENOUS | Status: DC
  Administered 2018-03-17 – 2018-03-19 (×5): 8 mg/h via INTRAVENOUS
  Filled 2018-03-17 (×7): qty 80

## 2018-03-17 MED ORDER — SODIUM CHLORIDE 0.9 % IV SOLN
80.0000 mg | Freq: Once | INTRAVENOUS | Status: AC
  Administered 2018-03-17: 80 mg via INTRAVENOUS
  Filled 2018-03-17: qty 80

## 2018-03-17 MED ORDER — ACETAMINOPHEN 650 MG RE SUPP: 650.0000 mg | Freq: Four times a day (QID) | RECTAL | Status: DC | PRN

## 2018-03-17 MED ORDER — HYDROCODONE-ACETAMINOPHEN 5-325 MG PO TABS: 1.0000 | ORAL_TABLET | ORAL | Status: DC | PRN

## 2018-03-17 MED ORDER — LOSARTAN POTASSIUM 25 MG PO TABS: 12.5000 mg | ORAL_TABLET | Freq: Every day | ORAL | 3 refills | Status: DC

## 2018-03-17 MED ORDER — PANTOPRAZOLE SODIUM 40 MG IV SOLR: 40.0000 mg | Freq: Two times a day (BID) | INTRAVENOUS | Status: DC

## 2018-03-17 MED ORDER — PRAVASTATIN SODIUM 20 MG PO TABS
20.0000 mg | ORAL_TABLET | Freq: Every day | ORAL | Status: DC
  Administered 2018-03-17 – 2018-03-21 (×5): 20 mg via ORAL
  Filled 2018-03-17 (×5): qty 1

## 2018-03-17 NOTE — Patient Instructions (Addendum)
Medication Instructions:  Your physician has recommended you make the following change in your medication:  1-Decrease Losartan 12.5 mg by mouth daily 2-STOP taking Aspirin on September 26th.   Labwork: Your physician recommends that you have lab work today-STAT CBC, BMET, Hepatic, and UA   Testing/Procedures: NONE  Follow-Up: Your physician wants you to return on September 20th for a follow-up visit with Ellen Henri PA.   If you need a refill on your cardiac medications before your next appointment, please call your pharmacy.

## 2018-03-17 NOTE — ED Provider Notes (Signed)
[facility] EMERGENCY DEPARTMENT Provider Note   CSN: [id number] Arrival date & time: 03/17/18  1346     History   Chief Complaint Chief Complaint  Patient presents with  . Near Syncope    HPI [name] is a 84 y.o. male.  The history is provided by the patient and a relative. No language interpreter was used.   [name] is a 84 y.o. male who presents to the Emergency Department complaining of near syncope. He presents to the ED by EMS for evaluation of near syncope and hypotension.  Hx is provided by patient and daughter. He has been having issues with occasional confusion and weakness since Sunday a week ago. A week and a half ago he was admitted to the hospital and had a cardiac stent placed at that time. His daughter reports increased confusion and weakness significantly worsened yesterday. He saw cardiology today and had outpatient labs performed. After going home from cardiology he had an episode of staring into space and being less responsive with a blood pressure of 70 systolic. 911 was called at that time. He denies any symptoms. Denies any chest pain, shortness of breath, abdominal pain, nausea, vomiting, hematochezia, melena.  Past Medical History:  Diagnosis Date  . Coronary atherosclerosis of native coronary artery    a. Anterior STEMI 2009 s/p BMSx2 to mid & prox LAD and angioplasty to distal LAD. total RCA with collaterals, moderate Cx plaquing. a. EF 55-60% in 2013.  [name] Elevated troponin   . Gross hematuria   . Gross hematuria 11/18/2008   Qualifier: Diagnosis of  By: Owens Shark, RN, BSN, [name]    . Hemoptysis 05/16/2016  . HTN (hypertension)   . Myocardial infarct ([place]) 03/21/2008  . NSVT (nonsustained ventricular tachycardia) ([place])    a. Per DC summary from time of STEMI 2009.  [name] PAF (paroxysmal atrial fibrillation) ([place])   . Paroxysmal atrial flutter ([place])    a. Abnl EKG 01/2012 concerning for atrial flutter, event monitor showed  sinus bradycardia only and no pauses. Not felt to be a candidate for longterm anticoag due to hematuria.  . Phlebitis and thrombophlebitis of superficial vessels of lower extremities   . Pure hypercholesterolemia    a. Has not tolerated statins in the past.  . Right leg DVT ([place])    a. Dx 01/2014.  [name] Sinus bradycardia    a. By prior event monitor.  . Sinus bradycardia    a. By prior event monitor.   . Thrombocytopenia ([place])   . Urinary retention 06/20/2011    Patient Active Problem List   Diagnosis Date Noted  . GI bleed 03/17/2018  . Persistent atrial fibrillation ([place])   . Leukocytosis   . Toe pain, bilateral 11/30/2017  . Toenail fungus 11/30/2017  . Sinus congestion 09/13/2017  . Cough 04/25/2017  . Orthostatic hypotension 11/26/2016  . Laceration of right lower extremity 11/26/2016  . Laceration of right forearm 11/01/2016  . Health care maintenance 09/17/2016  . PAD (peripheral artery disease) ([place]) 08/13/2016  . Cerebrovascular accident (CVA) ([place]) 08/09/2016  . Cold extremity without peripheral vascular disease   . Abnormal CT of the chest 07/21/2016  . Acute on chronic combined systolic and diastolic congestive heart failure ([place]) 06/17/2016  . Tinea pedis 05/27/2016  . NSTEMI (non-ST elevated myocardial infarction) ([place]) 05/16/2016  . Chronic atrial fibrillation ([place]) 05/16/2016  . Chronic ITP (idiopathic thrombocytopenia) ([facility])   . CHF (congestive heart failure), NYHA class II, chronic, diastolic ([place])   .  CAP (community acquired pneumonia) 05/15/2016  . Nail dystrophy 09/01/2015  . Microscopic hematuria 09/28/2014  . Gait instability 09/27/2014  . Coronary atherosclerosis of native coronary artery   . Paroxysmal atrial flutter ([place])   . Pure hypercholesterolemia   . HTN (hypertension)   . Hyperlipidemia 04/23/2013  . Atrial arrhythmia 04/23/2013  . Left carpal tunnel syndrome 04/06/2013  . Left hand pain 04/06/2013  . Numbness and tingling in left hand  04/06/2013  . Benign prostate hyperplasia 10/04/2011  . Bladder neck obstruction 10/04/2011  . Pain in joint of right shoulder 09/29/2011  . Impingement syndrome of shoulder region 07/05/2011  . Levator scapula syndrome 07/05/2011  . Enlarged prostate with lower urinary tract symptoms (LUTS) 06/09/2011  . Hematuria, unspecified 06/09/2011  . Congestive heart failure with left ventricular diastolic dysfunction ([place]) 05/25/2011  . Chronic coronary artery disease 05/25/2011  . Degenerative disk disease 12/20/2010    Past Surgical History:  Procedure Laterality Date  . CARPAL TUNNEL RELEASE    . CORONARY STENT INTERVENTION N/A 03/06/2018   Procedure: CORONARY STENT INTERVENTION;  Surgeon: Lorretta Harp, MD;  Location: [facility];  Service: Cardiovascular;  Laterality: N/A;  . CORONARY STENT PLACEMENT  2009  . LEFT HEART CATH AND CORONARY ANGIOGRAPHY N/A 03/06/2018   Procedure: LEFT HEART CATH AND CORONARY ANGIOGRAPHY;  Surgeon: Lorretta Harp, MD;  Location: [facility];  Service: Cardiovascular;  Laterality: N/A;  . REPLACEMENT TOTAL KNEE BILATERAL     Left 2014 (Dr. Eddie Dibbles); right 2012 (Dr. Redmond Pulling)  . trigger finger surgery          Home Medications    Prior to Admission medications   Medication Sig Start Date End Date Taking? Authorizing Provider  aspirin EC 81 MG EC tablet Take 1 tablet (81 mg total) by mouth daily. 03/07/18  Yes Cheryln Manly, NP  clopidogrel (PLAVIX) 75 MG tablet Take 1 tablet (75 mg total) by mouth daily with breakfast. 03/08/18  Yes Cheryln Manly, NP  fluticasone (FLONASE) 50 MCG/ACT nasal spray Place 2 sprays into both nostrils daily as needed for allergies or rhinitis.   Yes [provider]  furosemide (LASIX) 20 MG tablet Take 2 tablets (40 mg total) by mouth daily. 08/25/17 12/27/25 Yes Daune Perch, NP  HYDROcodone-acetaminophen (NORCO/VICODIN) 5-325 MG tablet Take 1 tablet by mouth every 4 (four) hours as  needed. Patient taking differently: Take 1 tablet by mouth every 4 (four) hours as needed for moderate pain.  12/27/17  Yes Upstill, Nehemiah Settle, PA-C  loratadine (CLARITIN) 10 MG tablet Take 10 mg by mouth daily.   Yes [provider]  losartan (COZAAR) 25 MG tablet Take 0.5 tablets (12.5 mg total) by mouth daily. 03/17/18  Yes Lyda Jester M, PA-C  methocarbamol (ROBAXIN) 500 MG tablet Take 500 mg by mouth every 8 (eight) hours as needed for muscle spasms.   Yes [provider]  metoprolol tartrate (LOPRESSOR) 25 MG tablet TAKE 1/2 TABLET BY MOUTH 2 TIMES DAILY Patient taking differently: Take 12.5 mg by mouth 2 (two) times daily with a meal.  05/17/17  Yes Burnell Blanks, MD  nitroGLYCERIN (NITROSTAT) 0.4 MG SL tablet Place 1 tablet (0.4 mg total) under the tongue every 5 (five) minutes as needed for chest pain. 10/04/17  Yes Daune Perch, NP  potassium chloride (K-DUR) 10 MEQ tablet Take 1 tablet (10 mEq total) by mouth daily. 01/13/18  Yes Burnell Blanks, MD  pravastatin (PRAVACHOL) 20 MG tablet TAKE 1  TABLET (20 MG TOTAL) BY MOUTH DAILY AT 6 PM. Patient taking differently: Take 20 mg by mouth daily after supper.  12/20/17  Yes Leeanne Rio, MD  pregabalin (LYRICA) 75 MG capsule Take 1 capsule (75 mg total) by mouth 2 (two) times daily. 05/18/16  Yes Asencion Partridge, MD  Red Yeast Rice 600 MG CAPS Take 1,200 mg by mouth daily after supper.   Yes [provider]  Rivaroxaban (XARELTO) 15 MG TABS tablet Take 1 tablet (15 mg total) by mouth daily with supper. Patient taking differently: Take 15 mg by mouth daily after supper.  03/08/18  Yes Cheryln Manly, NP    Family History Family History  Problem Relation Age of Onset  . Cancer Sister   . Cancer Sister     Social History Social History   Tobacco Use  . Smoking status: Never Smoker  . Smokeless tobacco: Never Used  Substance Use Topics  . Alcohol use: No  . Drug use: No      Allergies   Prednisone and Rosuvastatin   Review of Systems Review of Systems  All other systems reviewed and are negative.    Physical Exam Updated Vital Signs BP 107/61   Pulse 82   Temp 97.6 F (36.4 C) (Oral)   Resp 16   Ht 5\' 10"  (1.778 m)   Wt 75.3 kg   SpO2 100%   BMI 23.82 kg/m   Physical Exam  Constitutional: He appears well-developed and well-nourished.  HENT:  Head: Normocephalic and atraumatic.  Cardiovascular: Normal rate and regular rhythm.  No murmur heard. Pulmonary/Chest: Effort normal and breath sounds normal. No respiratory distress.  Abdominal: Soft. There is no tenderness. There is no rebound and no guarding.  Genitourinary:  Genitourinary Comments: Black stools, nontender  Musculoskeletal: He exhibits no edema or tenderness.  Neurological: He is alert.  Oriented to person and place. Disoriented to time. Mildly confused.   Skin: Skin is warm and dry.  Psychiatric: He has a normal mood and affect. His behavior is normal.  Nursing note and vitals reviewed.    ED Treatments / Results  Labs (all labs ordered are listed, but only abnormal results are displayed) Labs Reviewed  BASIC METABOLIC PANEL - Abnormal; Notable for the following components:      Result Value   Glucose, Bld 150 (*)    BUN 113 (*)    Creatinine, Ser 1.99 (*)    GFR calc non Af Amer 28 (*)    GFR calc Af Amer 32 (*)    All other components within normal limits  CBC - Abnormal; Notable for the following components:   RBC 3.19 (*)    Hemoglobin 9.2 (*)    HCT 29.1 (*)    Platelets 120 (*)    All other components within normal limits  POC OCCULT BLOOD, ED - Abnormal; Notable for the following components:   Fecal Occult Bld POSITIVE (*)    All other components within normal limits  URINE CULTURE  URINALYSIS, ROUTINE W REFLEX MICROSCOPIC  SAMPLE TO BLOOD BANK  TYPE AND SCREEN  PREPARE RBC (CROSSMATCH)    EKG EKG Interpretation  Date/Time:  Friday March 17 2018 13:49:10 EDT Ventricular Rate:  85 PR Interval:    QRS Duration: 126 QT Interval:  406 QTC Calculation: 483 R Axis:   -81 Text Interpretation:  Atrial fibrillation RBBB and LAFB Nonspecific T abnormalities, lateral leads Interpretation limited secondary to artifact Confirmed by Quintella Reichert [phone]) on 03/17/2018  2:01:18 PM   Radiology No results found.  Procedures Procedures (including critical care time) CRITICAL CARE Performed by: Quintella Reichert   Total critical care time: 35 minutes  Critical care time was exclusive of separately billable procedures and treating other patients.  Critical care was necessary to treat or prevent imminent or life-threatening deterioration.  Critical care was time spent personally by me on the following activities: development of treatment plan with patient and/or surrogate as well as nursing, discussions with consultants, evaluation of patient's response to treatment, examination of patient, obtaining history from patient or surrogate, ordering and performing treatments and interventions, ordering and review of laboratory studies, ordering and review of radiographic studies, pulse oximetry and re-evaluation of patient's condition.  Medications Ordered in ED Medications  pantoprazole (PROTONIX) 80 mg in sodium chloride 0.9 % 250 mL (0.32 mg/mL) infusion (8 mg/hr Intravenous New Bag/Given 03/17/18 1550)  pantoprazole (PROTONIX) injection 40 mg (has no administration in time range)  0.9 %  sodium chloride infusion (Manually program via Guardrails IV Fluids) (has no administration in time range)  0.9 %  sodium chloride infusion (Manually program via Guardrails IV Fluids) (has no administration in time range)  sodium chloride 0.9 % bolus 500 mL (0 mLs Intravenous Stopped 03/17/18 1549)  pantoprazole (PROTONIX) 80 mg in sodium chloride 0.9 % 100 mL IVPB (0 mg Intravenous Stopped 03/17/18 1550)     Initial Impression / Assessment and Plan / ED Course   I have reviewed the triage vital signs and the nursing notes.  Pertinent labs & imaging results that were available during my care of the patient were reviewed by me and considered in my medical decision making (see chart for details).     Patient here for evaluation of hypotensive episode, weakness and some confusion. Reviewed records in epic. Outpatient labs today demonstrate worsening anemia compared to 10 days ago with significant elevation is BUN. Given his melena attic stools concern for upper G.I. bleed. He was started on a Protonix trip. Discussed with gastroenterologist on-call, who will see the patient and consult. Cardiology consulted at request of G.I. regarding anticoagulations following his recent stent. Family medicine consulted for admission for further treatment. Patient and family updated findings of studies recommendation for admission and they are in agreement with treatment plan.  Final Clinical Impressions(s) / ED Diagnoses   Final diagnoses:  Acute GI bleeding    ED Discharge Orders    None       Quintella Reichert, MD 03/17/18 [phone]

## 2018-03-17 NOTE — Telephone Encounter (Signed)
Ellen Henri PA spoke with patient's daughter.

## 2018-03-17 NOTE — Progress Notes (Signed)
03/17/2018 [name]   1923-08-28  [id number]  Primary Physician Alan Mems Delorse Limber, MD Primary Cardiologist: Dr. Angelena [name]  Reason for Visit/CC: [facility] F/u for CAD s/p NSTEMI   HPI:  [name] is a 84 y.o. male who is being seen today for post [name] f/u after admission for non-ST elevation myocardial infarction.  He has a history of multiple cardiac comorbidities and is followed by Dr. Angelena [name].  History is notable for CAD, heart failure with reduced ejection fraction with normalization of EF, hyperlipidemia, atrial fibrillation/flutter on anticoagulation therapy, history of DVT and prior CVA in 2018.   He was admitted to [name] on 03/03/18 with CC of exertional dyspnea. Initial troponin was 1.48. He was stated on IV heparin and admitted for NSTEMI. Troponin peaked at 40.85. He underwent left heart cath with findings noted below and underwent PCI/DESx1 to the OM. the previously placed stents in the LAD were noted to be patent. He was started on ASA and Plavix. Given his need for [facility], he will be on triple therapy with ASA/plavix and Xarelto for one month, then plan to stop ASA. Post cath labs were stable. No complications noted post cath. Echo during admission noted a decline in his EF to 30-35% with diffuse hypokinesis. He was continued on low dose BB prior to admission, and unable to titrate 2/2 to bradycardia. He was started on a low dose of losartan prior to discharge. Given his LDL is 67 on pravastatin and instability to tolerate more potent statins due to side effects, he was continued on pravastatin w/o any changes.   He is back today for f/u and here with his daughter. He denies recurrent dyspnea and no chest pain. However he has felt more tired and lethargic. His daughter noticed he has been confused at times. Also decreased PO intake/ loss of appetite. No energy. Dry mouth. Daugther has noticed major change from his usual baseline and concerned some of his new  modifications may be responsible.   EKG today show afib w/ CVR. HR 68 bpm. BP is 122/65. He reports full med compliance. He denies melena while on triple therapy. No vomiting. Normal urination. No dysuria. No issues with femoral cath site.    Cardiac Studies Cath: 03/06/18   Mid RCA to Dist RCA lesion is 100% stenosed.  Previously placed Prox LAD stent (unknown type) is widely patent.  Previously placed Prox LAD to Mid LAD stent (unknown type) is widely patent.  Ost 1st Mrg lesion is 99% stenosed.  A drug-eluting stent was successfully placed.  Post intervention, there is a 0% residual stenosis. Coronary Diagrams   Diagnostic Diagram       Post-Intervention Diagram        2D Echo 03/04/18 Study Conclusions  - Left ventricle: The cavity size was normal. Wall thickness was   increased in a pattern of moderate LVH. Systolic function was   moderately to severely reduced. The estimated ejection fraction   was in the range of 30% to 35%. Diffuse hypokinesis. The study is   not technically sufficient to allow evaluation of LV diastolic   function. - Aortic valve: Valve area (VTI): 1.92 cm^2. Valve area (Vmax):   2.04 cm^2. - Mitral valve: There was mild regurgitation. - Left atrium: The atrium was mildly dilated. - Right ventricle: Poorly visualized. Grossly appears enlarged with   decreased function. - Right atrium: Poorly visualized. Grossly appears enlarged. - Atrial septum: No defect or patent foramen ovale was identified. - Pulmonary  arteries: Systolic pressure was mildly increased. PA   peak pressure: 36 mm Hg (S). - LVEF is certaintly decreased from Jan 2018 study. Would recommend   limited study with echocontrast to more precisely evaluate   current current function.  Current Meds  Medication Sig  . aspirin EC 81 MG EC tablet Take 1 tablet (81 mg total) by mouth daily.  . clopidogrel (PLAVIX) 75 MG tablet Take 1 tablet (75 mg total) by mouth daily with  breakfast.  . fluticasone (FLONASE) 50 MCG/ACT nasal spray Place 2 sprays into both nostrils daily as needed for allergies or rhinitis.  . furosemide (LASIX) 20 MG tablet Take 2 tablets (40 mg total) by mouth daily.  [name] HYDROcodone-acetaminophen (NORCO/VICODIN) 5-325 MG tablet Take 1 tablet by mouth every 4 (four) hours as needed. (Patient taking differently: Take 1 tablet by mouth every 4 (four) hours as needed for moderate pain. )  . loratadine (CLARITIN) 10 MG tablet Take 10 mg by mouth daily.  [name] losartan (COZAAR) 25 MG tablet Take 1 tablet (25 mg total) by mouth daily.  . methocarbamol (ROBAXIN) 500 MG tablet Take 500 mg by mouth every 8 (eight) hours as needed for muscle spasms.  . metoprolol tartrate (LOPRESSOR) 25 MG tablet TAKE 1/2 TABLET BY MOUTH 2 TIMES DAILY (Patient taking differently: Take 12.5 mg by mouth 2 (two) times daily. )  . nitroGLYCERIN (NITROSTAT) 0.4 MG SL tablet Place 1 tablet (0.4 mg total) under the tongue every 5 (five) minutes as needed for chest pain.  . potassium chloride (K-DUR) 10 MEQ tablet Take 1 tablet (10 mEq total) by mouth daily.  . pravastatin (PRAVACHOL) 20 MG tablet TAKE 1 TABLET (20 MG TOTAL) BY MOUTH DAILY AT 6 PM.  . pregabalin (LYRICA) 75 MG capsule Take 1 capsule (75 mg total) by mouth 2 (two) times daily.  . Red Yeast Rice Extract (RED YEAST RICE PO) Take 1,200 mg by mouth daily.  . Rivaroxaban (XARELTO) 15 MG TABS tablet Take 1 tablet (15 mg total) by mouth daily with supper.   Allergies  Allergen Reactions  . Prednisone Other (See Comments)    Does not feel right  . Rosuvastatin Other (See Comments)    Aches   Past Medical History:  Diagnosis Date  . Coronary atherosclerosis of native coronary artery    a. Anterior STEMI 2009 s/p BMSx2 to mid & prox LAD and angioplasty to distal LAD. total RCA with collaterals, moderate Cx plaquing. a. EF 55-60% in 2013.  [name] Elevated troponin   . Gross hematuria   . Gross hematuria 11/18/2008   Qualifier:  Diagnosis of  By: Owens Shark, RN, BSN, [name]    . Hemoptysis 05/16/2016  . HTN (hypertension)   . Myocardial infarct ([place]) 03/21/2008  . NSVT (nonsustained ventricular tachycardia) ([place])    a. Per DC summary from time of STEMI 2009.  [name] PAF (paroxysmal atrial fibrillation) ([place])   . Paroxysmal atrial flutter ([place])    a. Abnl EKG 01/2012 concerning for atrial flutter, event monitor showed sinus bradycardia only and no pauses. Not felt to be a candidate for longterm anticoag due to hematuria.  . Phlebitis and thrombophlebitis of superficial vessels of lower extremities   . Pure hypercholesterolemia    a. Has not tolerated statins in the past.  . Right leg DVT ([place])    a. Dx 01/2014.  [name] Sinus bradycardia    a. By prior event monitor.  . Sinus bradycardia    a. By prior event monitor.   [name]  Thrombocytopenia ([place])   . Urinary retention 06/20/2011   Family History  Problem Relation Age of Onset  . Cancer Sister   . Cancer Sister    Past Surgical History:  Procedure Laterality Date  . CARPAL TUNNEL RELEASE    . CORONARY STENT INTERVENTION N/A 03/06/2018   Procedure: CORONARY STENT INTERVENTION;  Surgeon: Lorretta Harp, MD;  Location: [place] [facility];  Service: Cardiovascular;  Laterality: N/A;  . CORONARY STENT PLACEMENT  2009  . LEFT HEART CATH AND CORONARY ANGIOGRAPHY N/A 03/06/2018   Procedure: LEFT HEART CATH AND CORONARY ANGIOGRAPHY;  Surgeon: Lorretta Harp, MD;  Location: [place] [facility];  Service: Cardiovascular;  Laterality: N/A;  . REPLACEMENT TOTAL KNEE BILATERAL     Left 2014 (Dr. Eddie Dibbles); right 2012 (Dr. Redmond Pulling)  . trigger finger surgery     Social History   Socioeconomic History  . Marital status: Married    Spouse name: Not on file  . Number of children: Not on file  . Years of education: Not on file  . Highest education level: Not on file  Occupational History  . Not on file  Social Needs  . Financial resource strain: Not on file  . Food insecurity:    Worry:  Not on file    Inability: Not on file  . Transportation needs:    Medical: Not on file    Non-medical: Not on file  Tobacco Use  . Smoking status: Never Smoker  . Smokeless tobacco: Never Used  Substance and Sexual Activity  . Alcohol use: No  . Drug use: No  . Sexual activity: Not on file  Lifestyle  . Physical activity:    Days per week: Not on file    Minutes per session: Not on file  . Stress: Not on file  Relationships  . Social connections:    Talks on phone: Not on file    Gets together: Not on file    Attends religious service: Not on file    Active member of club or organization: Not on file    Attends meetings of clubs or organizations: Not on file    Relationship status: Not on file  . Intimate partner violence:    Fear of current or ex partner: Not on file    Emotionally abused: Not on file    Physically abused: Not on file    Forced sexual activity: Not on file  Other Topics Concern  . Not on file  Social History Narrative   Lives in [place]. Wife has died. Lives next door to his daughter [name]. Also has a very supportive daughter, [name] who lives out of town. 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 Denies tobacco, alcohol, or drug use ever in lifetime.     Review of Systems: General: negative for chills, fever, night sweats or weight changes.  Cardiovascular: negative for chest pain, dyspnea on exertion, edema, orthopnea, palpitations, paroxysmal nocturnal dyspnea or shortness of breath Dermatological: negative for rash Respiratory: negative for cough or wheezing Urologic: negative for hematuria Abdominal: negative for nausea, vomiting, diarrhea, bright red blood per rectum, melena, or hematemesis Neurologic: negative for visual changes, syncope, or dizziness All other systems reviewed and are otherwise negative except as noted above.   Physical Exam:  Blood pressure (!) 122/56, pulse 68, height 5\' 10"  (1.778 m), weight 166 lb (75.3  kg).  General appearance: alert, cooperative and no distress Neck: no carotid bruit and no JVD Lungs: clear  to auscultation bilaterally Heart: irregularly irregular rhythm and regular rate Extremities: extremities normal, atraumatic, no cyanosis or edema Pulses: 2+ and symmetric Skin: Skin color, texture, turgor normal. No rashes or lesions Neurologic: Grossly normal  EKG atrial fibrillations 68 bpm -- personally reviewed   ASSESSMENT AND PLAN:   1. CAD: s/p recent NSTEMI w/ PCI/DESx1 to the OM. The previously placed stents in the LAD were noted to be patent. He denies any recurrent dyspnea. Pt placed on triple therapy w/ ASA/Plavix/ Eliquis (afib) w/ plans to stop ASA after 30 days.   2. Systolic HF: EF noted to be reduced on echo post MI, 35%. Continue on BB. Losartan added. Volume appear stable. Unable to titrate HF meds due to BP and side effects w/ losartan.   3. HTN: controlled on current regimen but ? If recent symptoms are 2/2 medications. Losartan recently added. We will titrate dose down to 12.5 mg daily, and may ultimately stop if association. Will check BMP today.   4. Atrial Fibrillation/Flutter: rate is controlled on BB. He is on Eliquis for a/c. He denies abnormal bleeding but checking CBC today.   5. HLD: on statin therapy w/ Pravastatin. LDL is controlled at 67 mg/dL. No changes made today.   6. Fatigue: he has felt more tired and lethargic. His daughter noticed he has been confused at times. Also decreased PO intake/ loss of appetite. No energy. Dry mouth. Daugther has noticed major change from his usual baseline and concerned some of his new modifications may be responsible. EKG today show afib w/ CVR. HR 68 bpm. BP is 122/65. He reports full med compliance. He denies melena while on triple therapy. No vomiting. Normal urination. No dysuria. No issues with femoral cath site.  -- He is on Tripple therapy w/ ASA, Plavix and Eliquis. We will check CBC to assess H/H and rule  out anemia -- Losartan recently added to regimen. We will check BMP to ensure renal function is ok. ? Dehydration. Electrolyte abnormalities -- Will also check FLTs. -- He denies dysuria, but daughter notes some confusion. He is elderly. Will check UA to r/o UTI.  -- Reduce dose of Losartan down to 12.5 mg daily to see if any improvement.   Follow-Up in 2 weeks.   [name], M[facility] 03/17/2018 10:08 AM

## 2018-03-17 NOTE — Telephone Encounter (Signed)
New Message:     Per Daughter: Patient was rushed to the hospital for  BP 76/33

## 2018-03-17 NOTE — ED Notes (Signed)
Attempted report 

## 2018-03-17 NOTE — Consult Note (Addendum)
Cardiology Consultation:   Patient ID: [name]; [id number]; Nov 02, 1923   Admit date: 03/17/2018 Date of Consult: 03/17/2018  Primary Care Provider: Leeanne Rio, MD Primary Cardiologist: Lauree Chandler, MD Primary Electrophysiologist:  None   Patient Profile:   [name] is a 84 y.o. male with a PMH of CAD s/p recent NSTEMI with PCI/DES to OM 03/06/18, atrial fibrillation on xarelto, chronic systolic CHF (EF 93-79%), HLD, and CVA who presents with near syncope/hypotension, found to have a GI bleed, and is being seen today for the evaluation of anticoagulation recommendations at the request of Dr. Mingo Amber.  History of Present Illness:   [name] was recently admitted to the hospital from 03/03/18-03/07/18 after presenting with an NSTEMI. He underwent a LHC on 03/06/18 and was found to have CTO of RCA with bidirectional collaterals (similar to prior cath), 99% stenosis of OM 1 managed with PCI/DES, and patent LAD stents. He was recommended for triple therapy with aspirin x1 month, plavix, and xarelto for afib.   He was seen in the cardiology office today by Ellen Henri, PA-C and denied complaints of melena or hematochezia. His daugther, who accompanied him, reported increasing fatigue/lethargy since Sunday. His BP was stable at 122/56. He was recommended for blood work at that visit to monitor his H/H and BMET. After returning home, patient reported generalized weakness and became pre-syncopal. EMS was activated and he was found to be hypotensive on arrival.  On arrival to the ED, BP stable with SBPs in the 100s. He was found to have a drop in Hgb from 13.8 on 03/07/18 to 9.2 on arrival today and elevated BUN concerning for acute GI bleed. GI was consulted and he was started on an IV pantoprazole gtt. He was ordered for 2u PRBCs. GI would ideally like to perform an endoscopy but plavix would need to be held x5 days and xarelto for at least 3 days. He was  admitted to medicine. Cardiology was consulted for recommendations regarding anticoagulation.  Past Medical History:  Diagnosis Date  . Coronary atherosclerosis of native coronary artery    a. Anterior STEMI 2009 s/p BMSx2 to mid & prox LAD and angioplasty to distal LAD. total RCA with collaterals, moderate Cx plaquing. a. EF 55-60% in 2013.  [name] Elevated troponin   . Gross hematuria   . Gross hematuria 11/18/2008   Qualifier: Diagnosis of  By: Owens Shark, RN, BSN, [name]    . Hemoptysis 05/16/2016  . HTN (hypertension)   . Myocardial infarct ([place]) 03/21/2008  . NSVT (nonsustained ventricular tachycardia) ([place])    a. Per DC summary from time of STEMI 2009.  [name] PAF (paroxysmal atrial fibrillation) ([place])   . Paroxysmal atrial flutter ([place])    a. Abnl EKG 01/2012 concerning for atrial flutter, event monitor showed sinus bradycardia only and no pauses. Not felt to be a candidate for longterm anticoag due to hematuria.  . Phlebitis and thrombophlebitis of superficial vessels of lower extremities   . Pure hypercholesterolemia    a. Has not tolerated statins in the past.  . Right leg DVT ([place])    a. Dx 01/2014.  [name] Sinus bradycardia    a. By prior event monitor.  . Sinus bradycardia    a. By prior event monitor.   . Thrombocytopenia ([place])   . Urinary retention 06/20/2011    Past Surgical History:  Procedure Laterality Date  . CARPAL TUNNEL RELEASE    . CORONARY STENT INTERVENTION N/A 03/06/2018   Procedure: CORONARY STENT  INTERVENTION;  Surgeon: Lorretta Harp, MD;  Location: [facility];  Service: Cardiovascular;  Laterality: N/A;  . CORONARY STENT PLACEMENT  2009  . LEFT HEART CATH AND CORONARY ANGIOGRAPHY N/A 03/06/2018   Procedure: LEFT HEART CATH AND CORONARY ANGIOGRAPHY;  Surgeon: Lorretta Harp, MD;  Location: [facility];  Service: Cardiovascular;  Laterality: N/A;  . REPLACEMENT TOTAL KNEE BILATERAL     Left 2014 (Dr. Eddie Dibbles); right 2012 (Dr. Redmond Pulling)  . trigger finger  surgery       Home Medications:  Prior to Admission medications   Medication Sig Start Date End Date Taking? Authorizing Provider  aspirin EC 81 MG EC tablet Take 1 tablet (81 mg total) by mouth daily. 03/07/18   Cheryln Manly, NP  clopidogrel (PLAVIX) 75 MG tablet Take 1 tablet (75 mg total) by mouth daily with breakfast. 03/08/18   Cheryln Manly, NP  fluticasone [facility]) 50 MCG/ACT nasal spray Place 2 sprays into both nostrils daily as needed for allergies or rhinitis.    [provider]  furosemide (LASIX) 20 MG tablet Take 2 tablets (40 mg total) by mouth daily. 08/25/17 12/27/25  Daune Perch, NP  HYDROcodone-acetaminophen (NORCO/VICODIN) 5-325 MG tablet Take 1 tablet by mouth every 4 (four) hours as needed. Patient taking differently: Take 1 tablet by mouth every 4 (four) hours as needed for moderate pain.  12/27/17   Charlann Lange, PA-C  loratadine (CLARITIN) 10 MG tablet Take 10 mg by mouth daily.    [provider]  losartan (COZAAR) 25 MG tablet Take 0.5 tablets (12.5 mg total) by mouth daily. 03/17/18   Lyda Jester M, PA-C  methocarbamol (ROBAXIN) 500 MG tablet Take 500 mg by mouth every 8 (eight) hours as needed for muscle spasms.    [provider]  metoprolol tartrate (LOPRESSOR) 25 MG tablet TAKE 1/2 TABLET BY MOUTH 2 TIMES DAILY Patient taking differently: Take 12.5 mg by mouth 2 (two) times daily.  05/17/17   Burnell Blanks, MD  nitroGLYCERIN (NITROSTAT) 0.4 MG SL tablet Place 1 tablet (0.4 mg total) under the tongue every 5 (five) minutes as needed for chest pain. 10/04/17   Daune Perch, NP  potassium chloride (K-DUR) 10 MEQ tablet Take 1 tablet (10 mEq total) by mouth daily. 01/13/18   Burnell Blanks, MD  pravastatin (PRAVACHOL) 20 MG tablet TAKE 1 TABLET (20 MG TOTAL) BY MOUTH DAILY AT 6 PM. 12/20/17   Leeanne Rio, MD  pregabalin (LYRICA) 75 MG capsule Take 1 capsule (75 mg total) by mouth 2 (two) times daily.  05/18/16   Asencion Partridge, MD  Red Yeast Rice Extract (RED YEAST RICE PO) Take 1,200 mg by mouth daily.    [provider]  Rivaroxaban (XARELTO) 15 MG TABS tablet Take 1 tablet (15 mg total) by mouth daily with supper. 03/08/18   Cheryln Manly, NP    Inpatient Medications: Scheduled Meds: . sodium chloride   Intravenous Once  . sodium chloride   Intravenous Once  . [START ON 03/21/2018] pantoprazole  40 mg Intravenous Q12H   Continuous Infusions: . pantoprazole (PROTONIX) IVPB 80 mg (03/17/18 1520)  . pantoprozole (PROTONIX) infusion    . sodium chloride 500 mL (03/17/18 1441)   PRN Meds:   Allergies:    Allergies  Allergen Reactions  . Prednisone Other (See Comments)    Does not feel right  . Rosuvastatin Other (See Comments)    Aches    Social History:  Social History   Socioeconomic History  . Marital status: Married    Spouse name: Not on file  . Number of children: Not on file  . Years of education: Not on file  . Highest education level: Not on file  Occupational History  . Not on file  Social Needs  . Financial resource strain: Not on file  . Food insecurity:    Worry: Not on file    Inability: Not on file  . Transportation needs:    Medical: Not on file    Non-medical: Not on file  Tobacco Use  . Smoking status: Never Smoker  . Smokeless tobacco: Never Used  Substance and Sexual Activity  . Alcohol use: No  . Drug use: No  . Sexual activity: Not on file  Lifestyle  . Physical activity:    Days per week: Not on file    Minutes per session: Not on file  . Stress: Not on file  Relationships  . Social connections:    Talks on phone: Not on file    Gets together: Not on file    Attends religious service: Not on file    Active member of club or organization: Not on file    Attends meetings of clubs or organizations: Not on file    Relationship status: Not on file  . Intimate partner violence:    Fear of current or ex partner: Not on  file    Emotionally abused: Not on file    Physically abused: Not on file    Forced sexual activity: Not on file  Other Topics Concern  . Not on file  Social History Narrative   Lives in [place]. Wife has died. Lives next door to his daughter [name]. Also has a very supportive daughter, [name] who lives out of town. 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 Denies tobacco, alcohol, or drug use ever in lifetime.    Family History:    Family History  Problem Relation Age of Onset  . Cancer Sister   . Cancer Sister      ROS:  Please see the history of present illness.   All other ROS reviewed and negative.     Physical Exam/Data:   Vitals:   03/17/18 1351 03/17/18 1352  BP:  109/67  Pulse:  74  Resp:  16  Temp:  (!) 97.5 F (36.4 C)  TempSrc:  Temporal  SpO2:  97%  Weight: 75.3 kg   Height: 5\' 10"  (1.778 m)    No intake or output data in the 24 hours ending 03/17/18 1525 Filed Weights   03/17/18 1351  Weight: 75.3 kg   Body mass index is 23.82 kg/m.  General:  Well nourished, well developed, elderly gentleman laying in bed in no acute distress HEENT: sclera anicteric; lips dry  Neck: no JVD Vascular: No carotid bruits; distal pulses 2+ bilaterally Cardiac:  normal S1, S2; IRRR; no murmurs, rubs, or gallops Lungs:  clear to auscultation bilaterally, no wheezing, rhonchi or rales  Abd: NABS, soft, nontender, no hepatomegaly Ext: no edema Musculoskeletal:  No deformities, BUE and BLE strength normal and equal Skin: warm and dry; chronic venous stasis skin changes on b/l LE  Neuro:  CNs 2-12 intact, no focal abnormalities noted Psych:  Normal affect   EKG:  The EKG was personally reviewed and demonstrates:  Atrial fibrillation with rate 85, chronic RBBB and LAFB Telemetry:  Telemetry was personally reviewed and demonstrates:  Atrial fibrillation with  CVR, occasional PVCs  Relevant CV Studies: Left heart catheterization 03/06/18:  Mid RCA to Dist  RCA lesion is 100% stenosed.  Previously placed Prox LAD stent (unknown type) is widely patent.  Previously placed Prox LAD to Mid LAD stent (unknown type) is widely patent.  Ost 1st Mrg lesion is 99% stenosed.  A drug-eluting stent was successfully placed.  Post intervention, there is a 0% residual stenosis.  IMPRESSION: Mr. Meinzer has patent proximal and mid LAD bare-metal stents placed by Dr. Lia Foyer 03/21/2008.  He has a chronically occluded RCA with bidirectional collaterals which is similar to his cath 10 years ago.  His infarct-related artery was a 99% circumflex obtuse marginal branch on the band with a highly tortuous proximal most segment which was successfully stented with a 3 mm x 20 mill meter long Synergy drug-eluting stent with an excellent angiographic result.  The patient tolerated the procedure well.  The guidewire and catheter were removed and the sheath was secured.  Patient will be treated with "triple therapy" with low-dose aspirin, Plavix and Xarelto for 1 month after which aspirin can be discontinued.  He left the lab in stable condition.  Echocardiogram 03/04/18: Study Conclusions  - Left ventricle: The cavity size was normal. Wall thickness was   increased in a pattern of moderate LVH. Systolic function was   moderately to severely reduced. The estimated ejection fraction   was in the range of 30% to 35%. Diffuse hypokinesis. The study is   not technically sufficient to allow evaluation of LV diastolic   function. - Aortic valve: Valve area (VTI): 1.92 cm^2. Valve area (Vmax):   2.04 cm^2. - Mitral valve: There was mild regurgitation. - Left atrium: The atrium was mildly dilated. - Right ventricle: Poorly visualized. Grossly appears enlarged with   decreased function. - Right atrium: Poorly visualized. Grossly appears enlarged. - Atrial septum: No defect or patent foramen ovale was identified. - Pulmonary arteries: Systolic pressure was mildly increased.  PA   peak pressure: 36 mm Hg (S). - LVEF is certaintly decreased from Jan 2018 study. Would recommend   limited study with echocontrast to more precisely evaluate   current current function.  Laboratory Data:  Chemistry Recent Labs  Lab 03/17/18 1045 03/17/18 1356  NA 142 141  K 4.5 4.3  CL 106 108  CO2 25 24  GLUCOSE 119* 150*  BUN 115* 113*  CREATININE 1.94* 1.99*  CALCIUM 9.6 9.1  GFRNONAA 29* 28*  GFRAA 34* 32*  ANIONGAP  --  9    Recent Labs  Lab 03/17/18 1045  PROT 6.1  ALBUMIN 3.9  AST 24  ALT 32  ALKPHOS 82  BILITOT 0.9   Hematology Recent Labs  Lab 03/17/18 1045 03/17/18 1356  WBC 9.9 9.4  RBC 3.33* 3.19*  HGB 9.3* 9.2*  HCT 28.6* 29.1*  MCV 86 91.2  MCH 27.9 28.8  MCHC 32.5 31.6  RDW 15.6* 15.1  PLT 131* 120*   Cardiac EnzymesNo results for input(s): TROPONINI in the last 168 hours. No results for input(s): TROPIPOC in the last 168 hours.  BNPNo results for input(s): BNP, PROBNP in the last 168 hours.  DDimer No results for input(s): DDIMER in the last 168 hours.  Radiology/Studies:  No results found.  Assessment and Plan:   1. Acute GI bleed in the setting of triple anticoagulation: patient s/p LHC 03/06/18 with PCI/DES to OM1 and recommended for triple therapy with aspirin x1, plavix, and xarelto for afib. He has felt increasing  fatigue since that time. He denied melena or bleeding at follow-up cardiology visit today. Upon returning home, he was hypotensive a presyncopal so EMS was activated. On arrival here his Hgb was 9.2 (previously 13.8 on 8/27) with an elevated BUN concerning for acute GI bleed. GI is following and the patient was ordered for 2 [facility] and IV pantoprazole gtt. GI ideally would like to do EGD but would need to hold plavix x5 days and xarelto x3.  - Given recent intervention, would be hesitant to stop plavix at this time.  - Discontinue aspirin - Hold xarelto in the setting of acute GI bleed. Would benefit from resuming  anticoagulation once cleared by GI for stroke ppx as his CHA2DS2-VASc Score is 7 (CHF, HTN, Vascular, Age >75, and Stroke)  - Continue to monitor Hgb closely and transfuse to keep Hgb >10.   2. CAD s/p NSTEMI with PCI/DES to OM1 on 03/06/18: no anginal complaints at this time. Aspirin and plavix held at this time given acute GI bleed.  - Would continue plavix given recent intervention - Continue statin - Discontinue aspirin - Continue to hold metoprolol for hypotension and resume as BP improves  3. Chronic systolic CHF: EF 26-83% on last echo 03/03/18. Home medications on hold given hypotension - Resume home lasix, losartan, and metoprolol as BP improves  4. Atrial fibrillation: rate controlled on EKG this admission. Home xarelto on hold given acute GI bleed.  - Would benefit from resuming anticoagulation once cleared by GI for stroke ppx as his CHA2DS2-VASc Score is 7 (CHF, HTN, Vascular, Age >75, and Stroke) - may need to have risks/benefits discussion going forward.   5. HLD: - Continue statin     For questions or updates, please contact [place] Please consult [email or web address] for contact info under Cardiology/STEMI.   Signed, Abigail Butts, PA-C  03/17/2018 3:25 PM [phone] Agree with assessment and plan by Roby Lofts PA-C  Mr. Wherley was admitted with hypotension and relative anemia probably from acute blood loss.  He status post circumflex obtuse marginal branch stenting by myself 2 weeks ago.  He has a chronically occluded RCA with patent LAD stents.  Symptomatically improved after discharge.  He is on Xarelto for chronic A. fib.  He was sent home on aspirin and Plavix as well.  When he was seen today in the office he was hypotensive with a hemoglobin of 9 with a drop down from 13.  He is in A. fib with a controlled ventricular response.  I agree with holding his antihypertensive medications.  His serum creatinine is increased from 1.2-1.9 as well.  He will need  packed red blood cell transfusion to hemoglobin of 11 or 12.  I agree with discontinuing aspirin and Xarelto for the time being acknowledging that he would be at increased stroke risk given his chronic A. fib off oral anticoagulation.  We will follow his hemoglobin closely as well as his renal function.  He is not a candidate for endoscopy given the fact that he is on Plavix.   Lorretta Harp, M.D., [place], [facility], [place] [address] [place], [place]  [id number]  [phone] 03/17/2018 5:12 PM

## 2018-03-17 NOTE — H&P (Addendum)
[facility] Admission History and Physical Service Pager: [phone]  Patient name: [name] Medical record number: [id number] Date of birth: 04-Jul-1924 Age: 84 y.o. Gender: male  Primary Care Provider: Leeanne Rio, MD Consultants: GI, Cardio  Code Status: Full   Chief Complaint: Near syncopal event   Assessment and Plan: [name] is a 84 y.o. male presenting with near syncopal event, found to have GI bleed. PMH is significant for Atrial fibrillation, CKD, HTN, HfrEF, NSTEMI s/p RCA stent on 8/26, CVA, and chronic ITP.   Symptomatic Anemia in the setting of GI Bleed: Pt presents following near syncopal event with lightheadedness and dizziness associated with hypotension with BP as low as 97/54. Hgb found to be 9.2 on admit, previously 13.8 on 8/26, and FOBT positive. Concern for upper GI bleed given history of melena and elevated BUN to 115 (previously 105) in the setting of triple therapy with Aspirin, Plavix, and Xarelto. Could consider peptic ulcer disease with NSAID use vs gastritis. Mallory weiss tear unlikely with no hematemesis or recent coughing. Considered GI malignancy given patient's age and weight loss in the past week, however given acute presentation with patient at baseline prior to makes this more unlikely. GI has already evaluated the patient in the ED, and started him on a protonix bolus followed by infusion, and transfusing 2 U pRBCs. Goal hgb around 10.  -Admit to stepdown; attending Dr. Mingo Amber -GI following; appreciate recommendations as below, potential endoscopy if Plavix can be held -S/p Protonix bolus, now currently on protonix infusion -2 pRBCs, follow up H/H  -2 large bore IV's in place -Cardiology following; recommended continuing plavix given recent stent placement on 8/26  -Hold home aspirin and Xarelto  -Monitor CM, vitals per routine   NSTEMI s/p RCA PCI on 8/26: Stable. No anginal complaints  currently. Taking aspirin and plavix at home.  -Cardiology following; recommend continuing plavix d/t recent intervention  -Cont home pravastatin 20 mg  -Holding home metoprolol for lower pressures    Atrial Fibrillation: Stable.  HR 70-80's. EKG showing atrial fibrillation. Takes metoprolol 12.5 BID and Xarelto at home.  -Hold home Xarelto in setting of acute GI bleed, discuss risk vs benefits once cleared by GI -Monitor HR closely while off rate controlling medication   Acute on chronic kidney disease:  Cr 1.99 on admit, 1.126 on 8/26. Baseline around 1.3. Consideration for hypovolemia in setting of GI bleed.  -Monitor BMP   HFrEF: Stable.  EF 30-35% on Echo in 02/2018. No signs of fluid overload on exam. Takes lasix 20 mg, losartan 25 mg, and metoprolol 12.5 BID at home.  -Hold home lasix, losartan, and metoprolol d/t softer pressures, resume with improvement   HTN:  Presented with softer pressures, likely secondary to volume loss with GI bleed as above. Takes losartan 25 mg and metoprolol 12.5 BID.   -Monitor BP  -Hold home BP meds as above per softer pressures   Chronic ITP: Stable.  Platelets 131 on admit, baseline 100.  -Monitor CBC   Prediabetes: Stable.  A1c 6.0 in 02/2018, not in problem list history. Glucose 119 on admit. Not on any medications, diet controlled.  -Monitor BMP   FEN/GI: Clear liquid diet,  Prophylaxis: SCDs, on plavix   Disposition: Admit to stepdown, attending Dr. Mingo Amber   History of Present Illness:  [name] is a 84 y.o. male presenting following a near syncopal event. PMH includes atrial fib on Xarelto therapy, NSTEMI s/p stent placement 02/2018,  HTN, combined CHF, and previous CVA. His two daughters at bedside provided most of his history. His daughter note the patient has been doing well after having a drug eluting stent placed in RCA on 8/26 up until this past Sunday (5 days ago). They noticed on Sunday he started acting confused and  appeared to be very groggy, sleeping sitting up, but came around quickly in a minute or two back to baseline. However, this morning after they got home from a schedule medical follow up, he sat down and started feeling "woozy." At that time, he states his vision became blurry, especially in the L, and had two jerks in his right arm. While moving from the chair to the bed, he felt dizzy and lightheaded, so she checked his BP once sitting down, which she noted to be 77/33. No weakness with walking or LOC. She called EMS, while they arrived his BP continued to be 86'V systolic. Denies any slurred speech, facial droop, bowel or bladder incontinence with the episode. During the past week, family also notes he has lost some weight in the past week, which they attribute to poor po intake with decreased appetite, and some pallor in his face. He notes some darker stools in the last week, however denies any N/V, abdominal pain, or hematochezia. Not using any additional NSAIDs other than his daily aspirin at home. Prior to these events, patient is very clear at baseline. Lives alone and still drives, able to do all ADL's on his own.    On presentation to the ED, he was afebrile and hemodynamically stable. CXR showing enlargement of cardiac silhouette, right basilar atelectasis vs infiltrate. CBC and CMP notable for glucose 119, BUN 115, Cr 1.94, WBC 9.9, Hgb 9.3 (13.8 on 8/26), plts 131. U/A with 1+ leukocytes, 6-10 WBC. Fecal occult positive. EKG atrial fibrillation with RBBB. GI and cardiology were consulted while in the ED, started on protonix IV infusion after bolus dose, given 516ml NS bolus, and started on 2 U pRBCs.   Review Of Systems: Per HPI with the following additions:   Review of Systems  Constitutional: Positive for weight loss. Negative for chills and fever.  HENT: Negative for nosebleeds.   Eyes: Positive for blurred vision. Negative for pain, discharge and redness.  Respiratory: Negative for cough,  sputum production and shortness of breath.   Cardiovascular: Negative for chest pain, palpitations, orthopnea, claudication, leg swelling and PND.  Gastrointestinal: Positive for melena. Negative for abdominal pain, blood in stool, constipation, diarrhea, heartburn, nausea and vomiting.  Genitourinary: Negative for dysuria.  Neurological: Positive for dizziness. Negative for tingling, sensory change, speech change, focal weakness, seizures, loss of consciousness, weakness and headaches.    Patient Active Problem List   Diagnosis Date Noted  . Persistent atrial fibrillation ([place])   . Leukocytosis   . Toe pain, bilateral 11/30/2017  . Toenail fungus 11/30/2017  . Sinus congestion 09/13/2017  . Cough 04/25/2017  . Orthostatic hypotension 11/26/2016  . Laceration of right lower extremity 11/26/2016  . Laceration of right forearm 11/01/2016  . Health care maintenance 09/17/2016  . PAD (peripheral artery disease) ([place]) 08/13/2016  . Cerebrovascular accident (CVA) ([place]) 08/09/2016  . Cold extremity without peripheral vascular disease   . Abnormal CT of the chest 07/21/2016  . Acute on chronic combined systolic and diastolic congestive heart failure ([place]) 06/17/2016  . Tinea pedis 05/27/2016  . NSTEMI (non-ST elevated myocardial infarction) ([place]) 05/16/2016  . Chronic atrial fibrillation ([place]) 05/16/2016  . Chronic ITP (idiopathic  thrombocytopenia) ([place])   . CHF (congestive heart failure), NYHA class II, chronic, diastolic ([place])   . CAP (community acquired pneumonia) 05/15/2016  . Nail dystrophy 09/01/2015  . Microscopic hematuria 09/28/2014  . Gait instability 09/27/2014  . Coronary atherosclerosis of native coronary artery   . Paroxysmal atrial flutter ([place])   . Pure hypercholesterolemia   . HTN (hypertension)   . Hyperlipidemia 04/23/2013  . Atrial arrhythmia 04/23/2013  . Left carpal tunnel syndrome 04/06/2013  . Left hand pain 04/06/2013  . Numbness and tingling in left hand  04/06/2013  . Benign prostate hyperplasia 10/04/2011  . Bladder neck obstruction 10/04/2011  . Pain in joint of right shoulder 09/29/2011  . Impingement syndrome of shoulder region 07/05/2011  . Levator scapula syndrome 07/05/2011  . Enlarged prostate with lower urinary tract symptoms (LUTS) 06/09/2011  . Hematuria, unspecified 06/09/2011  . Congestive heart failure with left ventricular diastolic dysfunction ([place]) 05/25/2011  . Chronic coronary artery disease 05/25/2011  . Degenerative disk disease 12/20/2010    Past Medical History: Past Medical History:  Diagnosis Date  . Coronary atherosclerosis of native coronary artery    a. Anterior STEMI 2009 s/p BMSx2 to mid & prox LAD and angioplasty to distal LAD. total RCA with collaterals, moderate Cx plaquing. a. EF 55-60% in 2013.  [name] Elevated troponin   . Gross hematuria   . Gross hematuria 11/18/2008   Qualifier: Diagnosis of  By: Owens Shark, RN, BSN, [name]    . Hemoptysis 05/16/2016  . HTN (hypertension)   . Myocardial infarct ([place]) 03/21/2008  . NSVT (nonsustained ventricular tachycardia) ([place])    a. Per DC summary from time of STEMI 2009.  [name] PAF (paroxysmal atrial fibrillation) ([place])   . Paroxysmal atrial flutter ([place])    a. Abnl EKG 01/2012 concerning for atrial flutter, event monitor showed sinus bradycardia only and no pauses. Not felt to be a candidate for longterm anticoag due to hematuria.  . Phlebitis and thrombophlebitis of superficial vessels of lower extremities   . Pure hypercholesterolemia    a. Has not tolerated statins in the past.  . Right leg DVT ([place])    a. Dx 01/2014.  [name] Sinus bradycardia    a. By prior event monitor.  . Sinus bradycardia    a. By prior event monitor.   . Thrombocytopenia ([place])   . Urinary retention 06/20/2011    Past Surgical History: Past Surgical History:  Procedure Laterality Date  . CARPAL TUNNEL RELEASE    . CORONARY STENT INTERVENTION N/A 03/06/2018   Procedure: CORONARY STENT  INTERVENTION;  Surgeon: Lorretta Harp, MD;  Location: [facility];  Service: Cardiovascular;  Laterality: N/A;  . CORONARY STENT PLACEMENT  2009  . LEFT HEART CATH AND CORONARY ANGIOGRAPHY N/A 03/06/2018   Procedure: LEFT HEART CATH AND CORONARY ANGIOGRAPHY;  Surgeon: Lorretta Harp, MD;  Location: [facility];  Service: Cardiovascular;  Laterality: N/A;  . REPLACEMENT TOTAL KNEE BILATERAL     Left 2014 (Dr. Eddie Dibbles); right 2012 (Dr. Redmond Pulling)  . trigger finger surgery      Social History: Social History   Tobacco Use  . Smoking status: Never Smoker  . Smokeless tobacco: Never Used  Substance Use Topics  . Alcohol use: No  . Drug use: No   Additional social history: Lives at home at alone.  Please also refer to relevant sections of EMR.  Family History: Family History  Problem Relation Age of Onset  . Cancer Sister   .  Cancer Sister     Allergies and Medications: Allergies  Allergen Reactions  . Prednisone Other (See Comments)    Does not feel right  . Rosuvastatin Other (See Comments)    Aches   No current facility-administered medications on file prior to encounter.    Current Outpatient Medications on File Prior to Encounter  Medication Sig Dispense Refill  . aspirin EC 81 MG EC tablet Take 1 tablet (81 mg total) by mouth daily.    . clopidogrel (PLAVIX) 75 MG tablet Take 1 tablet (75 mg total) by mouth daily with breakfast. 90 tablet 1  . fluticasone (FLONASE) 50 MCG/ACT nasal spray Place 2 sprays into both nostrils daily as needed for allergies or rhinitis.    . furosemide (LASIX) 20 MG tablet Take 2 tablets (40 mg total) by mouth daily. 60 tablet 11  . HYDROcodone-acetaminophen (NORCO/VICODIN) 5-325 MG tablet Take 1 tablet by mouth every 4 (four) hours as needed. (Patient taking differently: Take 1 tablet by mouth every 4 (four) hours as needed for moderate pain. ) 12 tablet 0  . loratadine (CLARITIN) 10 MG tablet Take 10 mg by mouth daily.    [name]  losartan (COZAAR) 25 MG tablet Take 0.5 tablets (12.5 mg total) by mouth daily. 45 tablet 3  . methocarbamol (ROBAXIN) 500 MG tablet Take 500 mg by mouth every 8 (eight) hours as needed for muscle spasms.    . metoprolol tartrate (LOPRESSOR) 25 MG tablet TAKE 1/2 TABLET BY MOUTH 2 TIMES DAILY (Patient taking differently: Take 12.5 mg by mouth 2 (two) times daily. ) 30 tablet 11  . nitroGLYCERIN (NITROSTAT) 0.4 MG SL tablet Place 1 tablet (0.4 mg total) under the tongue every 5 (five) minutes as needed for chest pain. 25 tablet 3  . potassium chloride (K-DUR) 10 MEQ tablet Take 1 tablet (10 mEq total) by mouth daily. 30 tablet 6  . pravastatin (PRAVACHOL) 20 MG tablet TAKE 1 TABLET (20 MG TOTAL) BY MOUTH DAILY AT 6 PM. 90 tablet 1  . pregabalin (LYRICA) 75 MG capsule Take 1 capsule (75 mg total) by mouth 2 (two) times daily. 60 capsule 0  . Red Yeast Rice Extract (RED YEAST RICE PO) Take 1,200 mg by mouth daily.    . Rivaroxaban (XARELTO) 15 MG TABS tablet Take 1 tablet (15 mg total) by mouth daily with supper. 30 tablet 6    Objective: BP 109/67 (BP Location: Right Arm)   Pulse 74   Temp (!) 97.5 F (36.4 C) (Temporal)   Resp 16   Ht 5\' 10"  (1.778 m)   Wt 75.3 kg   SpO2 97%   BMI 23.82 kg/m  Exam: General: Alert, NAD, smiling sitting up in the bed  HEENT: NCAT, MM dry, oropharynx non-erythematous, facial pallor   Cardiac: Irregularly irregular no m/g/r Lungs: Clear bilaterally, no increased WOB on RA   Abdomen: soft, tender to bilateral lower quadrants, non-distended, normoactive BS, no masses palpated.  Msk: Moves all extremities spontaneously  Ext: Warm, dry, 2+ distal pulses, no edema  Neuro: Alert, oriented. Speech appropriate. No focal neuro deficits. CN 2-12 intact. Pupils equal and reactive, EOMI. Eyelid lag on the L, chronic. Sensation bilaterally intact. Muscle strength 5/5 BUE and BLE. 2/4 patellar reflexes bilaterally.  Psych: appropriate affect Derm: No rashes noted.    Labs and Imaging: CBC BMET  Recent Labs  Lab 03/17/18 1356  WBC 9.4  HGB 9.2*  HCT 29.1*  PLT 120*   Recent Labs  Lab 03/17/18 1356  NA 141  K 4.3  CL 108  CO2 24  BUN 113*  CREATININE 1.99*  GLUCOSE 150*  CALCIUM 9.1     No results found.   Patriciaann Clan, DO 03/17/2018, 3:00 PM PGY-1, [facility] pager: [phone], text pages welcome  FPTS Upper-Level Resident Addendum  I have independently interviewed and examined the patient. I have discussed the above with the original author and agree with their documentation. Please see also any attending notes.   Bufford Lope, DO PGY-3, [facility] 03/17/2018 6:31 PM  FPTS Service pager: [phone] (text pages welcome through [place])

## 2018-03-17 NOTE — Consult Note (Addendum)
[facility] Consult  Referring Provider: Quintella Reichert (ER) Primary Care Physician:  Leeanne Rio, MD Primary Gastroenterologist: unassigned  Reason for Consultation:  Melena, anemia, hypotension, presyncope  HPI: [name] is a 84 y.o. male was brought to the ER by EMS called by his daughters as they thought patient would collapse and passed out. His systolic blood pressure was in 60s during evaluation by EMS. Patient states he has been in his usual state of health until about a week ago, when he started noticing that his stools were darker than usual. His daughters report about a 10 pound weight loss in the last several weeks attributed to loss of appetite along with difficulty swallowing solids more than liquids. Patient complains of feeling dizzy, lightheaded, but his daughter states that he has been intermittently confused. He was admitted on 03/03/18 and underwent cardiac catheterization for non-STEMI with placement of drug-eluting stent, was started on aspirin and Plavix and continued on Xarelto for history of atrial fibrillation and DVT. Patient denies heartburn and acid reflux on a regular basis. He denies abdominal pain, nausea or vomiting. No prior EGD,he reports having a colonoscopy several years ago in [place].   Past Medical History:  Diagnosis Date  . Coronary atherosclerosis of native coronary artery    a. Anterior STEMI 2009 s/p BMSx2 to mid & prox LAD and angioplasty to distal LAD. total RCA with collaterals, moderate Cx plaquing. a. EF 55-60% in 2013.  [name] Elevated troponin   . Gross hematuria   . Gross hematuria 11/18/2008   Qualifier: Diagnosis of  By: Owens Shark, RN, BSN, [name]    . Hemoptysis 05/16/2016  . HTN (hypertension)   . Myocardial infarct ([place]) 03/21/2008  . NSVT (nonsustained ventricular tachycardia) ([place])    a. Per DC summary from time of STEMI 2009.  [name] PAF (paroxysmal atrial fibrillation) ([place])   . Paroxysmal atrial flutter  ([place])    a. Abnl EKG 01/2012 concerning for atrial flutter, event monitor showed sinus bradycardia only and no pauses. Not felt to be a candidate for longterm anticoag due to hematuria.  . Phlebitis and thrombophlebitis of superficial vessels of lower extremities   . Pure hypercholesterolemia    a. Has not tolerated statins in the past.  . Right leg DVT ([place])    a. Dx 01/2014.  [name] Sinus bradycardia    a. By prior event monitor.  . Sinus bradycardia    a. By prior event monitor.   . Thrombocytopenia ([place])   . Urinary retention 06/20/2011    Past Surgical History:  Procedure Laterality Date  . CARPAL TUNNEL RELEASE    . CORONARY STENT INTERVENTION N/A 03/06/2018   Procedure: CORONARY STENT INTERVENTION;  Surgeon: Lorretta Harp, MD;  Location: [facility];  Service: Cardiovascular;  Laterality: N/A;  . CORONARY STENT PLACEMENT  2009  . LEFT HEART CATH AND CORONARY ANGIOGRAPHY N/A 03/06/2018   Procedure: LEFT HEART CATH AND CORONARY ANGIOGRAPHY;  Surgeon: Lorretta Harp, MD;  Location: [facility];  Service: Cardiovascular;  Laterality: N/A;  . REPLACEMENT TOTAL KNEE BILATERAL     Left 2014 (Dr. Eddie Dibbles); right 2012 (Dr. Redmond Pulling)  . trigger finger surgery      Prior to Admission medications   Medication Sig Start Date End Date Taking? Authorizing Provider  aspirin EC 81 MG EC tablet Take 1 tablet (81 mg total) by mouth daily. 03/07/18   Cheryln Manly, NP  clopidogrel (PLAVIX) 75 MG tablet Take 1 tablet (75  mg total) by mouth daily with breakfast. 03/08/18   Cheryln Manly, NP  fluticasone [facility]) 50 MCG/ACT nasal spray Place 2 sprays into both nostrils daily as needed for allergies or rhinitis.    [provider]  furosemide (LASIX) 20 MG tablet Take 2 tablets (40 mg total) by mouth daily. 08/25/17 12/27/25  Daune Perch, NP  HYDROcodone-acetaminophen (NORCO/VICODIN) 5-325 MG tablet Take 1 tablet by mouth every 4 (four) hours as needed. Patient taking  differently: Take 1 tablet by mouth every 4 (four) hours as needed for moderate pain.  12/27/17   Charlann Lange, PA-C  loratadine (CLARITIN) 10 MG tablet Take 10 mg by mouth daily.    [provider]  losartan (COZAAR) 25 MG tablet Take 0.5 tablets (12.5 mg total) by mouth daily. 03/17/18   Lyda Jester M, PA-C  methocarbamol (ROBAXIN) 500 MG tablet Take 500 mg by mouth every 8 (eight) hours as needed for muscle spasms.    [provider]  metoprolol tartrate (LOPRESSOR) 25 MG tablet TAKE 1/2 TABLET BY MOUTH 2 TIMES DAILY Patient taking differently: Take 12.5 mg by mouth 2 (two) times daily.  05/17/17   Burnell Blanks, MD  nitroGLYCERIN (NITROSTAT) 0.4 MG SL tablet Place 1 tablet (0.4 mg total) under the tongue every 5 (five) minutes as needed for chest pain. 10/04/17   Daune Perch, NP  potassium chloride (K-DUR) 10 MEQ tablet Take 1 tablet (10 mEq total) by mouth daily. 01/13/18   Burnell Blanks, MD  pravastatin (PRAVACHOL) 20 MG tablet TAKE 1 TABLET (20 MG TOTAL) BY MOUTH DAILY AT 6 PM. 12/20/17   Leeanne Rio, MD  pregabalin (LYRICA) 75 MG capsule Take 1 capsule (75 mg total) by mouth 2 (two) times daily. 05/18/16   Asencion Partridge, MD  Red Yeast Rice Extract (RED YEAST RICE PO) Take 1,200 mg by mouth daily.    [provider]  Rivaroxaban (XARELTO) 15 MG TABS tablet Take 1 tablet (15 mg total) by mouth daily with supper. 03/08/18   Cheryln Manly, NP    Current Facility-Administered Medications  Medication Dose Route Frequency Provider Last Rate Last Dose  . 0.9 %  sodium chloride infusion (Manually program via Guardrails IV Fluids)   Intravenous Once Quintella Reichert, MD      . pantoprazole (PROTONIX) 80 mg in sodium chloride 0.9 % 100 mL IVPB  80 mg Intravenous Once Quintella Reichert, MD      . pantoprazole (PROTONIX) 80 mg in sodium chloride 0.9 % 250 mL (0.32 mg/mL) infusion  8 mg/hr Intravenous Continuous Quintella Reichert, MD      .  Derrill Memo ON 03/21/2018] pantoprazole (PROTONIX) injection 40 mg  40 mg Intravenous Q12H Quintella Reichert, MD      . sodium chloride 0.9 % bolus 500 mL  500 mL Intravenous Once Quintella Reichert, MD 491.8 mL/hr at 03/17/18 1441 500 mL at 03/17/18 1441   Current Outpatient Medications  Medication Sig Dispense Refill  . aspirin EC 81 MG EC tablet Take 1 tablet (81 mg total) by mouth daily.    . clopidogrel (PLAVIX) 75 MG tablet Take 1 tablet (75 mg total) by mouth daily with breakfast. 90 tablet 1  . fluticasone (FLONASE) 50 MCG/ACT nasal spray Place 2 sprays into both nostrils daily as needed for allergies or rhinitis.    . furosemide (LASIX) 20 MG tablet Take 2 tablets (40 mg total) by mouth daily. 60 tablet 11  . HYDROcodone-acetaminophen (NORCO/VICODIN) 5-325 MG tablet Take 1 tablet  by mouth every 4 (four) hours as needed. (Patient taking differently: Take 1 tablet by mouth every 4 (four) hours as needed for moderate pain. ) 12 tablet 0  . loratadine (CLARITIN) 10 MG tablet Take 10 mg by mouth daily.    [name] losartan (COZAAR) 25 MG tablet Take 0.5 tablets (12.5 mg total) by mouth daily. 45 tablet 3  . methocarbamol (ROBAXIN) 500 MG tablet Take 500 mg by mouth every 8 (eight) hours as needed for muscle spasms.    . metoprolol tartrate (LOPRESSOR) 25 MG tablet TAKE 1/2 TABLET BY MOUTH 2 TIMES DAILY (Patient taking differently: Take 12.5 mg by mouth 2 (two) times daily. ) 30 tablet 11  . nitroGLYCERIN (NITROSTAT) 0.4 MG SL tablet Place 1 tablet (0.4 mg total) under the tongue every 5 (five) minutes as needed for chest pain. 25 tablet 3  . potassium chloride (K-DUR) 10 MEQ tablet Take 1 tablet (10 mEq total) by mouth daily. 30 tablet 6  . pravastatin (PRAVACHOL) 20 MG tablet TAKE 1 TABLET (20 MG TOTAL) BY MOUTH DAILY AT 6 PM. 90 tablet 1  . pregabalin (LYRICA) 75 MG capsule Take 1 capsule (75 mg total) by mouth 2 (two) times daily. 60 capsule 0  . Red Yeast Rice Extract (RED YEAST RICE PO) Take 1,200 mg by  mouth daily.    . Rivaroxaban (XARELTO) 15 MG TABS tablet Take 1 tablet (15 mg total) by mouth daily with supper. 30 tablet 6    Allergies as of 03/17/2018 - Review Complete 03/17/2018  Allergen Reaction Noted  . Prednisone Other (See Comments) 05/15/2016  . Rosuvastatin Other (See Comments) 03/19/2009    Family History  Problem Relation Age of Onset  . Cancer Sister   . Cancer Sister     Social History   Socioeconomic History  . Marital status: Married    Spouse name: Not on file  . Number of children: Not on file  . Years of education: Not on file  . Highest education level: Not on file  Occupational History  . Not on file  Social Needs  . Financial resource strain: Not on file  . Food insecurity:    Worry: Not on file    Inability: Not on file  . Transportation needs:    Medical: Not on file    Non-medical: Not on file  Tobacco Use  . Smoking status: Never Smoker  . Smokeless tobacco: Never Used  Substance and Sexual Activity  . Alcohol use: No  . Drug use: No  . Sexual activity: Not on file  Lifestyle  . Physical activity:    Days per week: Not on file    Minutes per session: Not on file  . Stress: Not on file  Relationships  . Social connections:    Talks on phone: Not on file    Gets together: Not on file    Attends religious service: Not on file    Active member of club or organization: Not on file    Attends meetings of clubs or organizations: Not on file    Relationship status: Not on file  . Intimate partner violence:    Fear of current or ex partner: Not on file    Emotionally abused: Not on file    Physically abused: Not on file    Forced sexual activity: Not on file  Other Topics Concern  . Not on file  Social History Narrative   Lives in [place]. Wife has died. Lives next door  to his daughter [name]. Also has a very supportive daughter, [name] who lives out of town. 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 Denies tobacco,  alcohol, or drug use ever in lifetime.    Review of Systems: Positive for: GI: Described in detail in HPI.    Gen:  anorexia, fatigue, weakness, malaise, involuntary weight loss, Denies any fever, chills, rigors, night sweats,and sleep disorder CV: Denies chest pain, angina, palpitations, syncope, orthopnea, PND, peripheral edema, and claudication. Resp: dyspnea,Denies cough, sputum, wheezing, coughing up blood. GU : Denies urinary burning, blood in urine, urinary frequency, urinary hesitancy, nocturnal urination, and urinary incontinence. MS: Denies joint pain or swelling.  Denies muscle weakness, cramps, atrophy.  Derm: Denies rash, itching, oral ulcerations, hives, unhealing ulcers.  Psych: Denies depression, anxiety, memory loss, suicidal ideation, hallucinations,  and confusion. Heme: Denies bruising and enlarged lymph nodes. Neuro:   dizziness,Denies any headaches or paresthesias. Endo:  Denies any problems with DM, thyroid, adrenal function.  Physical Exam: Vital signs in last 24 hours: Temp:  [97.5 F (36.4 C)] 97.5 F (36.4 C) (09/06 1352) Pulse Rate:  [68-74] 74 (09/06 1352) Resp:  [16] 16 (09/06 1352) BP: (109-122)/(56-67) 109/67 (09/06 1352) SpO2:  [97 %] 97 % (09/06 1352) Weight:  [75.3 kg] 75.3 kg (09/06 1351)    General:   Alert,  Well-developed, well-nourished, pleasant and cooperative in NAD Appears his stated age Head:  Normocephalic and atraumatic. Eyes:  Prominent pallor, no icterus.    Ears:  Normal auditory acuity. Nose:  No deformity, discharge,  or lesions. Mouth:  No deformity or lesions.  Oropharynx pink & moist. Neck:  Supple; no masses or thyromegaly. Lungs:  Clear throughout to auscultation.   No wheezes, crackles, or rhonchi. No acute distress. Heart:  Regular rate and rhythm; no murmurs, clicks, rubs,  or gallops. Extremities:  Without clubbing or edema. Neurologic:  Alert and  oriented x4;  grossly normal neurologically. Skin:  Intact without  significant lesions or rashes.Chronic lower extremity skin changes Psych:  Alert and cooperative. Normal mood and affect. Abdomen:  Soft, nontender and nondistended. No masses, hepatosplenomegaly or hernias noted. Normal bowel sounds, without guarding, and without rebound.         Lab Results: Recent Labs    03/17/18 1045 03/17/18 1356  WBC 9.9 9.4  HGB 9.3* 9.2*  HCT 28.6* 29.1*  PLT 131* 120*   BMET Recent Labs    03/17/18 1045 03/17/18 1356  NA 142 141  K 4.5 4.3  CL 106 108  CO2 25 24  GLUCOSE 119* 150*  BUN 115* 113*  CREATININE 1.94* 1.99*  CALCIUM 9.6 9.1   LFT Recent Labs    03/17/18 1045  PROT 6.1  ALBUMIN 3.9  AST 24  ALT 32  ALKPHOS 82  BILITOT 0.9  BILIDIR 0.37   PT/INR No results for input(s): LABPROT, INR in the last 72 hours.  Studies/Results: No results found.  Impression: 1.Melena,acute drop in hemoglobin, FOBT positive stool On aspirin,Plavix and Xarelto Baseline hemoglobin 13.8 on discharge on 03/07/18 Hemoglobin 9.3/9.2 today Elevated BUN/creatinine ratio of 115/1.94 compatible with upper GI bleeding  2. Impaired renal function, creatinine 1.99 and GFR 28 3.Multiple comorbidities-recent non-STEMI, hypertension, congestive heart failure, atrial fibrillation, prior CVA   Plan: 1.Protonix 80 mg IV 1 dose, followed by Protonix IV 8 mg per hour to be continued 2.2 units PRBC transfusion has been ordered 3. H&H monitoring and transfuse to keep hemoglobin around 10  Spoke with Cardiology  PA over the phone, regarding need to hold anticoagulation and antiplatelets in the setting of acute GI bleed. As per Cardiology PA, Dr. Quay Burow from Cardiology to evaluate and make further recommendations on anticoagulation/antiplatelet as patient has a new drug-eluting stent placed on 03/06/18. Will need endoscopy at some point, ideally Plavix needs to be on hold for 5 days and Xarelto needs to be on hold for at least 3 days if therapy is to be  performed/anticipated during endoscopy.  Clinical picture compatible with an upper GI bleed likely secondary to peptic ulcer disease, other etiologies as AV malformation, esophagitis/gastritis, malignancy less likely. Discussed the same with the patient and his 2 daughters at bedside. They verbalize understanding.   LOS: 0 days   Ronnette Juniper, MD  03/17/2018, 3:10 PM  Pager [phone] If no answer or after 5 PM call [phone]

## 2018-03-17 NOTE — ED Triage Notes (Signed)
Arrived via EMS patient had near syncope event after coming home from doctor's office. BP at office 122/56 when patient was home felt general weakness. Blood pressure 41'G systolic. EMS took BP 104/48 alert and answering and following commands appropriate. Patient had cardiac stent placed one week ago.

## 2018-03-17 NOTE — ED Notes (Signed)
ED Provider at bedside. 

## 2018-03-18 ENCOUNTER — Other Ambulatory Visit: Payer: Self-pay

## 2018-03-18 DIAGNOSIS — I4891 Unspecified atrial fibrillation: Secondary | ICD-10-CM

## 2018-03-18 DIAGNOSIS — D62 Acute posthemorrhagic anemia: Secondary | ICD-10-CM

## 2018-03-18 LAB — BASIC METABOLIC PANEL
Anion gap: 7 (ref 5–15)
BUN: 84 mg/dL — ABNORMAL HIGH (ref 8–23)
CALCIUM: 9 mg/dL (ref 8.9–10.3)
CO2: 26 mmol/L (ref 22–32)
Chloride: 112 mmol/L — ABNORMAL HIGH (ref 98–111)
Creatinine, Ser: 1.57 mg/dL — ABNORMAL HIGH (ref 0.61–1.24)
GFR calc Af Amer: 43 mL/min — ABNORMAL LOW (ref >60)
GFR, EST NON AFRICAN AMERICAN: 37 mL/min — AB (ref >60)
GLUCOSE: 114 mg/dL — AB (ref 70–99)
Potassium: 4.2 mmol/L (ref 3.5–5.1)
SODIUM: 145 mmol/L (ref 135–145)

## 2018-03-18 LAB — TYPE AND SCREEN
ABO/RH(D): A POS
ANTIBODY SCREEN: NEGATIVE
UNIT DIVISION: 0
Unit division: 0

## 2018-03-18 LAB — CBC
HCT: 32.4 % — ABNORMAL LOW (ref 39.0–52.0)
Hemoglobin: 10.3 g/dL — ABNORMAL LOW (ref 13.0–17.0)
MCH: 28.5 pg (ref 26.0–34.0)
MCHC: 31.8 g/dL (ref 30.0–36.0)
MCV: 89.5 fL (ref 78.0–100.0)
Platelets: 91 10*3/uL — ABNORMAL LOW (ref 150–400)
RBC: 3.62 MIL/uL — ABNORMAL LOW (ref 4.22–5.81)
RDW: 15.2 % (ref 11.5–15.5)
WBC: 6 10*3/uL (ref 4.0–10.5)

## 2018-03-18 LAB — BPAM RBC
BLOOD PRODUCT EXPIRATION DATE: 201910012359
Blood Product Expiration Date: 201910012359
ISSUE DATE / TIME: 201909061512
ISSUE DATE / TIME: 201909061803
UNIT TYPE AND RH: 6200
Unit Type and Rh: 6200

## 2018-03-18 LAB — HEMOGLOBIN AND HEMATOCRIT, BLOOD
HCT: 32.1 % — ABNORMAL LOW (ref 39.0–52.0)
HEMATOCRIT: 30.7 % — AB (ref 39.0–52.0)
HEMOGLOBIN: 10.2 g/dL — AB (ref 13.0–17.0)
HEMOGLOBIN: 10 g/dL — AB (ref 13.0–17.0)

## 2018-03-18 MED ORDER — SODIUM CHLORIDE 0.9 % IV SOLN: INTRAVENOUS | Status: DC

## 2018-03-18 NOTE — Progress Notes (Signed)
[facility] Progress Note  Subjective: The patient is doing well today. I saw him as well as his daughter who is present today. I reviewed the cardiologists note.given the recent cardiac intervention with drug eluting stent placed it was felt that he should remain on Plavix to protect the stent, but they did agree with stopping Xarelto for now in view of the melena that the patient had. Before restarting this however they are interested in GI clearance.  Objective: Vital signs in last 24 hours: Temp:  [97 F (36.1 C)-98 F (36.7 C)] 97.7 F (36.5 C) (09/07 0758) Pulse Rate:  [69-91] 83 (09/07 1010) Resp:  [11-22] 15 (09/07 1010) BP: (97-123)/(48-75) 108/59 (09/07 1010) SpO2:  [16 %-100 %] 98 % (09/07 1010) Weight:  [75.3 kg] 75.3 kg (09/06 1351) Weight change:    PE:  No distress  Heart regular rhythm  Abdomen soft and nontender  Lab Results: Results for orders placed or performed during the hospital encounter of 03/17/18 (from the past 24 hour(s))  Sample to Blood Bank     Status: None   Collection Time: 03/17/18  1:55 PM  Result Value Ref Range   Blood Bank Specimen SAMPLE AVAILABLE FOR TESTING    Sample Expiration      03/18/2018 Performed at [facility], [place] [address], [place], [place] [address]   Type and screen [place]     Status: None   Collection Time: 03/17/18  1:55 PM  Result Value Ref Range   ABO/RH(D) A POS    Antibody Screen NEG    Sample Expiration 03/20/2018    Unit Number [id number]    Blood Component Type RED CELLS,LR    Unit division 00    Status of Unit ISSUED,FINAL    Transfusion Status OK TO TRANSFUSE    Crossmatch Result      Compatible Performed at [facility], [place] [address], [place], [place] [address]    Unit Number [id number]    Blood Component Type RED CELLS,LR    Unit division 00    Status of Unit ISSUED,FINAL    Transfusion Status OK TO TRANSFUSE    Crossmatch Result Compatible    Basic metabolic panel     Status: Abnormal   Collection Time: 03/17/18  1:56 PM  Result Value Ref Range   Sodium 141 135 - 145 mmol/L   Potassium 4.3 3.5 - 5.1 mmol/L   Chloride 108 98 - 111 mmol/L   CO2 24 22 - 32 mmol/L   Glucose, Bld 150 (H) 70 - 99 mg/dL   BUN 113 (H) 8 - 23 mg/dL   Creatinine, Ser 1.99 (H) 0.61 - 1.24 mg/dL   Calcium 9.1 8.9 - 10.3 mg/dL   GFR calc non Af Amer 28 (L) >60 mL/min   GFR calc Af Amer 32 (L) >60 mL/min   Anion gap 9 5 - 15  CBC     Status: Abnormal   Collection Time: 03/17/18  1:56 PM  Result Value Ref Range   WBC 9.4 4.0 - 10.5 K/uL   RBC 3.19 (L) 4.22 - 5.81 MIL/uL   Hemoglobin 9.2 (L) 13.0 - 17.0 g/dL   HCT 29.1 (L) 39.0 - 52.0 %   MCV 91.2 78.0 - 100.0 fL   MCH 28.8 26.0 - 34.0 pg   MCHC 31.6 30.0 - 36.0 g/dL   RDW 15.1 11.5 - 15.5 %   Platelets 120 (L) 150 - 400 K/uL  POC occult  blood, ED     Status: Abnormal   Collection Time: 03/17/18  2:23 PM  Result Value Ref Range   Fecal Occult Bld POSITIVE (A) NEGATIVE  Prepare RBC     Status: None   Collection Time: 03/17/18  2:56 PM  Result Value Ref Range   Order Confirmation      ORDER PROCESSED BY BLOOD BANK Performed at [facility], [place] [address] [place], [place] [address]   Hemoglobin and hematocrit, blood     Status: Abnormal   Collection Time: 03/18/18 12:09 AM  Result Value Ref Range   Hemoglobin 10.0 (L) 13.0 - 17.0 g/dL   HCT 30.7 (L) 39.0 - 60.4 %  Basic metabolic panel     Status: Abnormal   Collection Time: 03/18/18  4:29 AM  Result Value Ref Range   Sodium 145 135 - 145 mmol/L   Potassium 4.2 3.5 - 5.1 mmol/L   Chloride 112 (H) 98 - 111 mmol/L   CO2 26 22 - 32 mmol/L   Glucose, Bld 114 (H) 70 - 99 mg/dL   BUN 84 (H) 8 - 23 mg/dL   Creatinine, Ser 1.57 (H) 0.61 - 1.24 mg/dL   Calcium 9.0 8.9 - 10.3 mg/dL   GFR calc non Af Amer 37 (L) >60 mL/min   GFR calc Af Amer 43 (L) >60 mL/min   Anion gap 7 5 - 15  CBC     Status: Abnormal   Collection Time: 03/18/18   4:29 AM  Result Value Ref Range   WBC 6.0 4.0 - 10.5 K/uL   RBC 3.62 (L) 4.22 - 5.81 MIL/uL   Hemoglobin 10.3 (L) 13.0 - 17.0 g/dL   HCT 32.4 (L) 39.0 - 52.0 %   MCV 89.5 78.0 - 100.0 fL   MCH 28.5 26.0 - 34.0 pg   MCHC 31.8 30.0 - 36.0 g/dL   RDW 15.2 11.5 - 15.5 %   Platelets 91 (L) 150 - 400 K/uL    Studies/Results: No results found.    Assessment: Melena, anemia.  Plan:   We will proceed with EGD tomorrow. He is to remain on Plavix per cardiology. I think we can do a diagnostic EGD without difficulty. Hopefully this will give [place] some answers as to his melena and 2 when we can clear him for resumption of the other anticoagulant.    Cassell Clement 03/18/2018, 10:32 AM  Pager: [phone] If no answer or after 5 PM call [phone] Lab Results  Component Value Date   HGB 10.3 (L) 03/18/2018   HGB 10.0 (L) 03/18/2018   HGB 9.2 (L) 03/17/2018   HGB 9.3 (L) 03/17/2018   HGB 13.1 08/25/2017   HGB 13.3 08/09/2017   HCT 32.4 (L) 03/18/2018   HCT 30.7 (L) 03/18/2018   HCT 29.1 (L) 03/17/2018   HCT 28.6 (L) 03/17/2018   HCT 39.6 08/25/2017   HCT 39.6 08/09/2017   ALKPHOS 82 03/17/2018   ALKPHOS 90 08/09/2017   ALKPHOS 63 08/09/2016   AST 24 03/17/2018   AST 18 08/09/2017   AST 28 08/09/2016   ALT 32 03/17/2018   ALT 17 08/09/2017   ALT 25 08/09/2016

## 2018-03-18 NOTE — Progress Notes (Signed)
Family Medicine Teaching Service Daily Progress Note Intern Pager: [phone]  Patient name: [name] Medical record number: [id number] Date of birth: [birth year] Age: 84 y.o. Gender: male  Primary Care Provider: Leeanne Rio, MD Consultants: GI, cardiology  Code Status: Full   [name]:  84 year old man with PMH CAD s/p PCI/ DES for NSTEMI in 03/06/2018, PAD, hx CVA, sCHF, HTN, HLD, afib who presented with symptomatic anemia secondary to GI bleeding on aspirin, plavix and xarelto.   Assessment and Plan:  Symptomatic anemia  - no melena since the time of admission, hypotension stable, no further symptoms of presyncope  - hgb 9.2 down from 13.8 one week prior to admission associated with symptoms of anemia and hypotension  - hemoglobin improved to 10.3 after ONE u PRBC - hemoglobin goal is 10 for recent ACS   GI bleeding  - thought to be upper due to melena and elevated BUN - on IV protonix 40 mg BID   - GI following, plan is for endoscopy ideally after placix can be held for 5 days which wont be until a few weeks from now  - continue to hold aspirin and xarelto   CAD s/p DES to RCA 03/06/2018  Hyperlipidemia  - cardiology recommends continuing plavix, will continue to hold aspirin and xarelto  - continue pravastatin  - will continue to hold metoprolol until BP improves   Persistent Afib  - Rate controlled - continue to hold metoprolol as rate is controlled and he remains hypotenxive  - CHADS VASc 7, continue to hold xarelto which is for stroke ppx in the setting of GI bleed as mentioned above  - continue tele monitoring   Acute kidney injury on CKD 3  - crt improving from admission after blood transfusion, likely prereanal 2/2 anemia - continue to monitor BMP   HFrEF  Hypertension  - remains euvolemic on exam  - continue to hold home losartan, metoprolol and lasix in the setting of acute symptomatic anemia   Chronic ITP  -  platelets lowered from 120 yesterday to 91 today baseline is around 100  - continue to monitor CBC   FEN/GI: clear liquid diet, IV protonix 40 BID  PPx: SCDs, on Plavix   Disposition: continue stepdown monitoring for acute symptomatic anemia with hypotension in the setting of GI bleed   Subjective:  No melena since 9/3. No bright red blood per rectum, lightheadedness, chest pain, or shortness of breath. Pre syncope was noticed by his daughter, she has seen no more concern for presyncope since the time of admission. Didn't eat much this morning but this is consistent with baseline. Daughter is at bedside, she ask good questions about if or when it will be safe to resume xarelto and when we plan for EGD.   Objective: Temp:  [97 F (36.1 C)-98 F (36.7 C)] 97.7 F (36.5 C) (09/07 0758) Pulse Rate:  [68-91] 91 (09/07 0758) Resp:  [11-22] 11 (09/07 0758) BP: (97-123)/(48-75) 108/66 (09/07 0758) SpO2:  [16 %-100 %] 99 % (09/07 0758) Weight:  [75.3 kg] 75.3 kg (09/06 1351) Physical Exam: General: sitting up in bed, well appearing, no acute distress  HEENT: conjunctiva are not pale  Cardiovascular: normal rate, irregularly irregular rhythm, no murmur, no peripheral edema Respiratory: normal work of breathing, not on supplemental oxygen, lungs clear to auscultation  Abdomen: soft, non tender, non distended  Extremities: warm, no rashes over the exposed legs, arms, or abdomen  Laboratory: Recent Labs  Lab 03/17/18 1045 03/17/18 1356 03/18/18 0009 03/18/18 0429  WBC 9.9 9.4  --  6.0  HGB 9.3* 9.2* 10.0* 10.3*  HCT 28.6* 29.1* 30.7* 32.4*  PLT 131* 120*  --  91*   Recent Labs  Lab 03/17/18 1045 03/17/18 1356 03/18/18 0429  NA 142 141 145  K 4.5 4.3 4.2  CL 106 108 112*  CO2 25 24 26   BUN 115* 113* 84*  CREATININE 1.94* 1.99* 1.57*  CALCIUM 9.6 9.1 9.0  PROT 6.1  --   --   BILITOT 0.9  --   --   ALKPHOS 82  --   --   ALT 32  --   --   AST 24  --   --   GLUCOSE 119* 150*  114*   Imaging/Diagnostic Tests: Reubin Milan, MD 03/18/2018, 9:15 AM PGY-3, [facility] [place] Intern pager: [phone], text pages welcome

## 2018-03-18 NOTE — Progress Notes (Signed)
Blood transfusion completed. Pt continued to be afebrile with no s/s of  reaction noted

## 2018-03-18 NOTE — Progress Notes (Signed)
Progress Note  Patient Name: [name] Date of Encounter: 03/18/2018  Primary Cardiologist:   Lauree Chandler, MD   Subjective   Feels "Well enough to walk out."    Inpatient Medications    Scheduled Meds: . clopidogrel  75 mg Oral Q breakfast  . loratadine  10 mg Oral Daily  . [START ON 03/21/2018] pantoprazole  40 mg Intravenous Q12H  . pravastatin  20 mg Oral q1800  . pregabalin  75 mg Oral BID   Continuous Infusions: . pantoprozole (PROTONIX) infusion 8 mg/hr (03/18/18 1206)   PRN Meds: acetaminophen **OR** acetaminophen, HYDROcodone-acetaminophen   Vital Signs    Vitals:   03/18/18 0445 03/18/18 0758 03/18/18 1010 03/18/18 1119  BP: 110/62 108/66 (!) 108/59 (!) 108/59  Pulse: 69 91 83 91  Resp: 17 11 15 20   Temp: 98 F (36.7 C) 97.7 F (36.5 C)  97.8 F (36.6 C)  TempSrc: Oral Oral  Oral  SpO2: 99% 99% 98% 99%  Weight:      Height:        Intake/Output Summary (Last 24 hours) at 03/18/2018 1343 Last data filed at 03/18/2018 1120 Gross per 24 hour  Intake 1411.37 ml  Output 875 ml  Net 536.37 ml   Filed Weights   03/17/18 1351  Weight: 75.3 kg    Telemetry    Atrial fib with controlled rate - Personally Reviewed  ECG    NA - Personally Reviewed  Physical Exam   GEN: No acute distress.   Neck: No  JVD Cardiac: Irreuglar RR, no murmurs, rubs, or gallops.  Respiratory: Clear  to auscultation bilaterally. GI: Soft, nontender, non-distended  MS: No  edema; No deformity. Neuro:  Nonfocal  Psych: Normal affect  Labs    Chemistry Recent Labs  Lab 03/17/18 1045 03/17/18 1356 03/18/18 0429  NA 142 141 145  K 4.5 4.3 4.2  CL 106 108 112*  CO2 25 24 26   GLUCOSE 119* 150* 114*  BUN 115* 113* 84*  CREATININE 1.94* 1.99* 1.57*  CALCIUM 9.6 9.1 9.0  PROT 6.1  --   --   ALBUMIN 3.9  --   --   AST 24  --   --   ALT 32  --   --   ALKPHOS 82  --   --   BILITOT 0.9  --   --   GFRNONAA 29* 28* 37*  GFRAA 34* 32* 43*    ANIONGAP  --  9 7     Hematology Recent Labs  Lab 03/17/18 1045 03/17/18 1356 03/18/18 0009 03/18/18 0429  WBC 9.9 9.4  --  6.0  RBC 3.33* 3.19*  --  3.62*  HGB 9.3* 9.2* 10.0* 10.3*  HCT 28.6* 29.1* 30.7* 32.4*  MCV 86 91.2  --  89.5  MCH 27.9 28.8  --  28.5  MCHC 32.5 31.6  --  31.8  RDW 15.6* 15.1  --  15.2  PLT 131* 120*  --  91*    Cardiac EnzymesNo results for input(s): TROPONINI in the last 168 hours. No results for input(s): TROPIPOC in the last 168 hours.   BNPNo results for input(s): BNP, PROBNP in the last 168 hours.   DDimer No results for input(s): DDIMER in the last 168 hours.   Radiology    No results found.  Cardiac Studies   NA  Patient Profile     84 y.o. male  with a PMH of CAD s/p recent NSTEMI with PCI/DES to  OM 03/06/18, atrial fibrillation on xarelto, chronic systolic CHF (EF 82-08%), HLD, and CVA who presents with near syncope/hypotension, found to have a GI bleed, and is being seen today for the evaluation of anticoagulation recommendations at the request of Dr. Mingo Amber.  Assessment & Plan    ACUTE GI BLEED:  Off Xarelto and ASA.  Continues Plavix.  Transfused    CAD:  No evidence of active ischemia.    CHRONIC SYSTOLIC HF:  Seems to be euvolemic.    ATRIAL FIB:  Holding on Xarelto.  Rate is controlled.  Probably restart beta blocker in the AM.      For questions or updates, please contact [place] Please consult [email or web address] for contact info under Cardiology/STEMI.   Signed, Minus Breeding, MD  03/18/2018, 1:43 PM

## 2018-03-19 ENCOUNTER — Inpatient Hospital Stay (HOSPITAL_COMMUNITY): Payer: Medicare Other | Admitting: Certified Registered Nurse Anesthetist

## 2018-03-19 ENCOUNTER — Encounter (HOSPITAL_COMMUNITY)
Admission: EM | Disposition: A | Discharge status: Home / Self Care | Payer: Self-pay | Source: Ambulatory Visit | Attending: Family Medicine

## 2018-03-19 ENCOUNTER — Encounter (HOSPITAL_COMMUNITY): Payer: Self-pay | Admitting: Certified Registered Nurse Anesthetist

## 2018-03-19 DIAGNOSIS — K2900 Acute gastritis without bleeding: Secondary | ICD-10-CM

## 2018-03-19 DIAGNOSIS — D693 Immune thrombocytopenic purpura: Secondary | ICD-10-CM

## 2018-03-19 HISTORY — PX: ESOPHAGOGASTRODUODENOSCOPY (EGD) WITH PROPOFOL: SHX5813

## 2018-03-19 LAB — HEMOGLOBIN AND HEMATOCRIT, BLOOD
HCT: 34.2 % — ABNORMAL LOW (ref 39.0–52.0)
HEMOGLOBIN: 10.6 g/dL — AB (ref 13.0–17.0)

## 2018-03-19 LAB — CBC
HCT: 31.5 % — ABNORMAL LOW (ref 39.0–52.0)
HEMOGLOBIN: 9.8 g/dL — AB (ref 13.0–17.0)
MCH: 28.6 pg (ref 26.0–34.0)
MCHC: 31.1 g/dL (ref 30.0–36.0)
MCV: 91.8 fL (ref 78.0–100.0)
Platelets: 82 10*3/uL — ABNORMAL LOW (ref 150–400)
RBC: 3.43 MIL/uL — AB (ref 4.22–5.81)
RDW: 15.2 % (ref 11.5–15.5)
WBC: 5.4 10*3/uL (ref 4.0–10.5)

## 2018-03-19 LAB — BASIC METABOLIC PANEL
ANION GAP: 10 (ref 5–15)
BUN: 53 mg/dL — ABNORMAL HIGH (ref 8–23)
CALCIUM: 8.8 mg/dL — AB (ref 8.9–10.3)
CO2: 22 mmol/L (ref 22–32)
Chloride: 114 mmol/L — ABNORMAL HIGH (ref 98–111)
Creatinine, Ser: 1.27 mg/dL — ABNORMAL HIGH (ref 0.61–1.24)
GFR, EST AFRICAN AMERICAN: 55 mL/min — AB (ref >60)
GFR, EST NON AFRICAN AMERICAN: 48 mL/min — AB (ref >60)
Glucose, Bld: 103 mg/dL — ABNORMAL HIGH (ref 70–99)
Potassium: 4.2 mmol/L (ref 3.5–5.1)
Sodium: 146 mmol/L — ABNORMAL HIGH (ref 135–145)

## 2018-03-19 SURGERY — ESOPHAGOGASTRODUODENOSCOPY (EGD) WITH PROPOFOL
Anesthesia: Monitor Anesthesia Care

## 2018-03-19 MED ORDER — PROPOFOL 10 MG/ML IV BOLUS
INTRAVENOUS | Status: DC | PRN
  Administered 2018-03-19: 10 mg via INTRAVENOUS

## 2018-03-19 MED ORDER — PHENYLEPHRINE 40 MCG/ML (10ML) SYRINGE FOR IV PUSH (FOR BLOOD PRESSURE SUPPORT)
PREFILLED_SYRINGE | INTRAVENOUS | Status: DC | PRN
  Administered 2018-03-19 (×3): 80 ug via INTRAVENOUS

## 2018-03-19 MED ORDER — RED YEAST RICE 600 MG PO CAPS: 1200.0000 mg | ORAL_CAPSULE | Freq: Every day | ORAL | Status: DC

## 2018-03-19 MED ORDER — LACTATED RINGERS IV SOLN
INTRAVENOUS | Status: DC
  Administered 2018-03-19 (×2): via INTRAVENOUS

## 2018-03-19 MED ORDER — PANTOPRAZOLE SODIUM 40 MG PO TBEC
40.0000 mg | DELAYED_RELEASE_TABLET | Freq: Two times a day (BID) | ORAL | Status: DC
  Administered 2018-03-19 – 2018-03-22 (×7): 40 mg via ORAL
  Filled 2018-03-19 (×7): qty 1

## 2018-03-19 MED ORDER — METOPROLOL TARTRATE 12.5 MG HALF TABLET
12.5000 mg | ORAL_TABLET | Freq: Two times a day (BID) | ORAL | Status: DC
  Administered 2018-03-19 – 2018-03-22 (×7): 12.5 mg via ORAL
  Filled 2018-03-19 (×7): qty 1

## 2018-03-19 MED ORDER — PROPOFOL 500 MG/50ML IV EMUL
INTRAVENOUS | Status: DC | PRN
  Administered 2018-03-19: 100 ug/kg/min via INTRAVENOUS

## 2018-03-19 SURGICAL SUPPLY — 15 items

## 2018-03-19 NOTE — Anesthesia Preprocedure Evaluation (Signed)
Anesthesia Evaluation  Patient identified by MRN, date of birth, ID band Patient awake    Reviewed: Allergy & Precautions, NPO status , Patient's Chart, lab work & pertinent test results  Airway Mallampati: II  TM Distance: >3 FB     Dental   Pulmonary pneumonia,    breath sounds clear to auscultation       Cardiovascular hypertension, + angina + CAD, + Past MI, + Peripheral Vascular Disease and +CHF   Rhythm:Regular Rate:Normal     Neuro/Psych    GI/Hepatic Neg liver ROS, History noted CG   Endo/Other    Renal/GU negative Renal ROS     Musculoskeletal  (+) Arthritis ,   Abdominal   Peds  Hematology  (+) anemia ,   Anesthesia Other Findings   Reproductive/Obstetrics                             Anesthesia Physical Anesthesia Plan  ASA: III  Anesthesia Plan: MAC   Post-op Pain Management:    Induction: Intravenous  PONV Risk Score and Plan: Treatment may vary due to age or medical condition  Airway Management Planned: Simple Face Mask and Nasal Cannula  Additional Equipment:   Intra-op Plan:   Post-operative Plan:   Informed Consent: I have reviewed the patients History and Physical, chart, labs and discussed the procedure including the risks, benefits and alternatives for the proposed anesthesia with the patient or authorized representative who has indicated his/her understanding and acceptance.   Dental advisory given  Plan Discussed with: CRNA and Anesthesiologist  Anesthesia Plan Comments:         Anesthesia Quick Evaluation

## 2018-03-19 NOTE — Transfer of Care (Signed)
Immediate Anesthesia Transfer of Care Note  Patient: [name]  Procedure(s) Performed: ESOPHAGOGASTRODUODENOSCOPY (EGD) WITH PROPOFOL (N/A )  Patient Location: endo  Anesthesia Type:MAC  Level of Consciousness: awake, alert  and oriented  Airway & Oxygen Therapy: Patient Spontanous Breathing and Patient connected to nasal cannula oxygen  Post-op Assessment: Report given to RN and Post -op Vital signs reviewed and stable  Post vital signs: Reviewed and stable  Last Vitals:  Vitals Value Taken Time  BP 113/64 03/19/2018 10:32 AM  Temp    Pulse 88 03/19/2018 10:33 AM  Resp 19 03/19/2018 10:33 AM  SpO2 100 % 03/19/2018 10:33 AM  Vitals shown include unvalidated device data.  Last Pain:  Vitals:   03/19/18 1029  TempSrc: Oral  PainSc: 0-No pain         Complications: No apparent anesthesia complications

## 2018-03-19 NOTE — Anesthesia Postprocedure Evaluation (Signed)
Anesthesia Post Note  Patient: [name]  Procedure(s) Performed: ESOPHAGOGASTRODUODENOSCOPY (EGD) WITH PROPOFOL (N/A )     Patient location during evaluation: PACU Anesthesia Type: MAC Level of consciousness: awake Pain management: pain level controlled Vital Signs Assessment: post-procedure vital signs reviewed and stable Respiratory status: spontaneous breathing Cardiovascular status: stable Anesthetic complications: no    Last Vitals:  Vitals:   03/19/18 1050 03/19/18 1100  BP: (!) 115/56 104/63  Pulse:  77  Resp: (!) 25 18  Temp:  36.8 C  SpO2: 100% 100%    Last Pain:  Vitals:   03/19/18 1100  TempSrc: Oral  PainSc:                  [name]

## 2018-03-19 NOTE — Progress Notes (Signed)
Progress Note  Patient Name: [name] Date of Encounter: 03/19/2018  Primary Cardiologist:   Lauree Chandler, MD   Subjective   Endoscopy today .  Feels OK.  Denies chest pain or SOB.   Inpatient Medications    Scheduled Meds: . [MAR Hold] clopidogrel  75 mg Oral Q breakfast  . [MAR Hold] loratadine  10 mg Oral Daily  . [MAR Hold] pantoprazole  40 mg Intravenous Q12H  . [MAR Hold] pravastatin  20 mg Oral q1800  . [MAR Hold] pregabalin  75 mg Oral BID   Continuous Infusions: . lactated ringers 20 mL/hr at 03/19/18 0917  . pantoprozole (PROTONIX) infusion 8 mg/hr (03/19/18 0737)   PRN Meds: [MAR Hold] acetaminophen **OR** [MAR Hold] acetaminophen, [MAR Hold] HYDROcodone-acetaminophen   Vital Signs    Vitals:   03/19/18 0412 03/19/18 0448 03/19/18 0847 03/19/18 0910  BP:  106/70 115/85 132/65  Pulse:  93 88 89  Resp:  15 18 15   Temp: 97.7 F (36.5 C)   98.4 F (36.9 C)  TempSrc: Oral   Oral  SpO2:  100% 100% 100%  Weight:    75.3 kg  Height:    5\' 10"  (1.778 m)    Intake/Output Summary (Last 24 hours) at 03/19/2018 1029 Last data filed at 03/19/2018 1024 Gross per 24 hour  Intake 733.55 ml  Output 925 ml  Net -191.45 ml   Filed Weights   03/17/18 1351 03/19/18 0910  Weight: 75.3 kg 75.3 kg    Telemetry    Atrial fib.   - Personally Reviewed  ECG    NA - Personally Reviewed  Physical Exam   GEN: No  acute distress.   Neck: No  JVD Cardiac: Irregular RR, no murmurs, rubs, or gallops.  Respiratory: Clear   to auscultation bilaterally. GI: Soft, nontender, non-distended, normal bowel sounds  MS:  No edema; No deformity. Neuro:   Nonfocal  Psych: Oriented and appropriate    Labs    Chemistry Recent Labs  Lab 03/17/18 1045 03/17/18 1356 03/18/18 0429 03/19/18 0704  NA 142 141 145 146*  K 4.5 4.3 4.2 4.2  CL 106 108 112* 114*  CO2 25 24 26 22   GLUCOSE 119* 150* 114* 103*  BUN 115* 113* 84* 53*  CREATININE 1.94* 1.99*  1.57* 1.27*  CALCIUM 9.6 9.1 9.0 8.8*  PROT 6.1  --   --   --   ALBUMIN 3.9  --   --   --   AST 24  --   --   --   ALT 32  --   --   --   ALKPHOS 82  --   --   --   BILITOT 0.9  --   --   --   GFRNONAA 29* 28* 37* 48*  GFRAA 34* 32* 43* 55*  ANIONGAP  --  9 7 10      Hematology Recent Labs  Lab 03/17/18 1356  03/18/18 0429 03/18/18 1633 03/19/18 0704  WBC 9.4  --  6.0  --  5.4  RBC 3.19*  --  3.62*  --  3.43*  HGB 9.2*   < > 10.3* 10.2* 9.8*  HCT 29.1*   < > 32.4* 32.1* 31.5*  MCV 91.2  --  89.5  --  91.8  MCH 28.8  --  28.5  --  28.6  MCHC 31.6  --  31.8  --  31.1  RDW 15.1  --  15.2  --  15.2  PLT 120*  --  91*  --  82*   < > = values in this interval not displayed.    Cardiac EnzymesNo results for input(s): TROPONINI in the last 168 hours. No results for input(s): TROPIPOC in the last 168 hours.   BNPNo results for input(s): BNP, PROBNP in the last 168 hours.   DDimer No results for input(s): DDIMER in the last 168 hours.   Radiology    No results found.  Cardiac Studies   NA  Patient Profile     84 y.o. male  with a PMH of CAD s/p recent NSTEMI with PCI/DES to OM 03/06/18, atrial fibrillation on xarelto, chronic systolic CHF (EF 82-99%), HLD, and CVA who presents with near syncope/hypotension, found to have a GI bleed, and is being seen today for the evaluation of anticoagulation recommendations at the request of Dr. Mingo Amber.  Assessment & Plan    ACUTE GI BLEED:  Off Xarelto and ASA.  Continues Plavix.  Transfused.  Hold the Xarelto for a few more days per the endoscopy note.  Continue to hold ASA.  CAD: Manage as above.   No evidence of active ischemia.    CHRONIC SYSTOLIC HF:   No change in therapy   Seems to be euvolemic.    ATRIAL FIB:   Resume Xarelto in tow days.  Restarted low dose beta blocker.   For questions or updates, please contact [place] Please consult [email or web address] for contact info under Cardiology/STEMI.   Signed, Minus Breeding, MD  03/19/2018, 10:29 AM

## 2018-03-19 NOTE — Progress Notes (Signed)
PHARMACIST - PHYSICIAN ORDER COMMUNICATION  CONCERNING: P&T Medication Policy on Herbal Medications  DESCRIPTION:  This patient's order for:  Red yeast Caps  has been noted.  This product(s) is classified as an "herbal" or natural product. Due to a lack of definitive safety studies or FDA approval, nonstandard manufacturing practices, plus the potential risk of unknown drug-drug interactions while on inpatient medications, the Pharmacy and Therapeutics Committee does not permit the use of "herbal" or natural products of this type within [facility].   ACTION TAKEN: The pharmacy department is unable to verify this order at this time and your patient has been informed of this safety policy. Please reevaluate patient's clinical condition at discharge and address if the herbal or natural product(s) should be resumed at that time.  Yelitza Reach A. Levada Dy, PharmD, [place] Pager: [phone] Please utilize Amion for appropriate phone number to reach the unit pharmacist ([place])

## 2018-03-19 NOTE — Progress Notes (Signed)
Family Medicine Teaching Service Daily Progress Note Intern Pager: [phone]  Patient name: [name] Medical record number: [id number] Date of birth: Mar 27, 1924 Age: 84 y.o. Gender: male  Primary Care Provider: Leeanne Rio, MD Consultants: GI, cardiology  Code Status: Full   [name]:  85 year old man with PMH CAD s/p PCI/ DES for NSTEMI in 03/06/2018, PAD, hx CVA, sCHF, HTN, HLD, afib who presented with symptomatic anemia secondary to GI bleeding on aspirin, plavix and xarelto.   Assessment and Plan:  Symptomatic anemia  - no melena since the time of admission, low bp stable, no further symptoms of presyncope  - at the time of admission hgb 9.2 down from 13.8 one week prior to admission associated with symptoms of anemia and hypotension  - hemoglobin improved to 10.3 after ONE u PRBC, remains stable at 9.8 today  - hemoglobin goal is 10 for recent ACS   Upper GI bleeding  - on IV protonix gtt - GI following, plan is for endoscopy today  - continue to hold aspirin and xarelto   CAD s/p DES to RCA 03/06/2018  Hyperlipidemia  - cardiology recommends continuing plavix, will continue to hold aspirin and xarelto  - continue pravastatin  - will continue to hold metoprolol until BP improves   Persistent Afib  - Rate controlled - continue to hold metoprolol as rate is controlled and he remains hypotensive  - CHADS VASc 7, continue to hold xarelto which is for stroke ppx in the setting of GI bleed as mentioned above  - continue tele monitoring   Acute kidney injury on CKD 3  - resolved   HFrEF  Hypertension  - remains euvolemic on exam  - continue to hold home losartan, metoprolol and lasix in the setting of acute symptomatic anemia   Chronic ITP  - platelets 82 today, baseline is around 100  - continue to monitor CBC   FEN/GI: clear liquid diet will be resumed after EGD, IV protonix gtt PPx: SCDs, on Plavix   Disposition:  transfer to telemetry monitoring for continued hypotension related to resolving symptomatic anemia in the setting of GI bleed   Subjective:  Feeling well today. Mentioned to his daughter that he noticed he is feeling better than prior to admission. Has not had a bowel movement since the time of admission, none since 9/3 in fact. He hasn't eaten much since admission ( 3 bites of jello was the only thing he was interested in yesterday). Denies lightheadedness, chest pain, or shortness of breath. Two daughters and one son are at bedside, they are updated on the plan for EGD today.   Objective: Temp:  [97.7 F (36.5 C)-98.1 F (36.7 C)] (P) 97.7 F (36.5 C) (09/08 0412) Pulse Rate:  [83-93] 93 (09/08 0448) Resp:  [10-20] 15 (09/08 0448) BP: (106-115)/(59-78) 106/70 (09/08 0448) SpO2:  [97 %-100 %] 100 % (09/08 0448) Physical Exam: General: sitting up in bed, well appearing, no acute distress  HEENT: conjunctiva are not pale  Cardiovascular: normal rate, irregularly irregular rhythm, no murmur, no peripheral edema Respiratory: normal work of breathing, right basilar rhonchi, not on supplemental oxygen Abdomen: soft, non tender, non distended  Extremities: warm, bruises over the exposed lower legs which daughter notes were present prior to arrival, no rashes over the exposed legs, arms, or abdomen    Laboratory: Recent Labs  Lab 03/17/18 1045  03/17/18 1356 03/18/18 0009 03/18/18 0429 03/18/18 1633  WBC 9.9  --  9.4  --  6.0  --   HGB 9.3*   < > 9.2* 10.0* 10.3* 10.2*  HCT 28.6*   < > 29.1* 30.7* 32.4* 32.1*  PLT 131*  --  120*  --  91*  --    < > = values in this interval not displayed.   Recent Labs  Lab 03/17/18 1045 03/17/18 1356 03/18/18 0429  NA 142 141 145  K 4.5 4.3 4.2  CL 106 108 112*  CO2 25 24 26   BUN 115* 113* 84*  CREATININE 1.94* 1.99* 1.57*  CALCIUM 9.6 9.1 9.0  PROT 6.1  --   --   BILITOT 0.9  --   --   ALKPHOS 82  --   --   ALT 32  --   --   AST 24  --    --   GLUCOSE 119* 150* 114*   Imaging/Diagnostic Tests: Reubin Milan, MD 03/19/2018, 8:12 AM PGY-3, [place] [facility] [place] Intern pager: [phone], text pages welcome

## 2018-03-19 NOTE — Op Note (Signed)
[facility] Patient Name: Jaquon Gingerich Procedure Date : 03/19/2018 MRN: [id number] Attending MD: Wonda Horner , MD Date of Birth: 1924/03/10 CSN: [id number] Age: 84 Admit Type: Inpatient Procedure:                Upper GI endoscopy Indications:              Melena Providers:                Wonda Horner, MD, Vista Lawman, RN, Cherylynn Ridges, Technician Referring MD:              Medicines:                Propofol per Anesthesia Complications:            No immediate complications. Estimated Blood Loss:     Estimated blood loss: none. Procedure:                Pre-Anesthesia Assessment:                           - Prior to the procedure, a History and Physical                            was performed, and patient medications and                            allergies were reviewed. The patient's tolerance of                            previous anesthesia was also reviewed. The risks                            and benefits of the procedure and the sedation                            options and risks were discussed with the patient.                            All questions were answered, and informed consent                            was obtained. Prior Anticoagulants: The patient has                            taken Plavix (clopidogrel), last dose was 1 day                            prior to procedure. Had also been on Xeralto but                            this was held a few days ago. ASA Grade Assessment:  III - A patient with severe systemic disease. After                            reviewing the risks and benefits, the patient was                            deemed in satisfactory condition to undergo the                            procedure.                           After obtaining informed consent, the endoscope was                            passed under direct vision. Throughout the          procedure, the patient's blood pressure, pulse, and                            oxygen saturations were monitored continuously. The                            [id number]-[id number] ([id number]) Olympus Adult EGD was introduced                            through the mouth, and advanced to the second part                            of duodenum. The upper GI endoscopy was                            accomplished without difficulty. The patient                            tolerated the procedure well. Scope In: Scope Out: Findings:      The examined esophagus was normal.      Diffuse moderate inflammation characterized by erosions, erythema and       granularity was found in the entire examined stomach. Biopsy not done       due to Plavix. Rule out H pylori. Maybe aggravated by aspirin.      The examined duodenum was normal. Impression:               - Normal esophagus.                           - Acute gastritis.                           - Normal examined duodenum.                           - No specimens collected. Recommendation:           - Resume regular diet.                           -  Continue present medications.                           - No active bleeding. Would continue PPI. Would                            hold Xeralto for a few more days. Will check                            serology for H pylori. Procedure Code(s):        --- Professional ---                           [phone], Esophagogastroduodenoscopy, flexible,                            transoral; diagnostic, including collection of                            specimen(s) by brushing or washing, when performed                            (separate procedure) Diagnosis Code(s):        --- Professional ---                           K29.00, Acute gastritis without bleeding                           K92.1, Melena (includes Hematochezia) CPT copyright 2017 American Medical Association. All rights reserved. The codes documented in this  report are preliminary and upon coder review may  be revised to meet current compliance requirements. Wonda Horner, MD 03/19/2018 10:39:41 AM This report has been signed electronically. Number of Addenda: 0

## 2018-03-19 NOTE — H&P (Addendum)
The patient presents to the endoscopy unit for EGD to evaluate melena. He had been on Xeralto which was discontinued but he remains on Plavix because of a recently placed drug eluting stent. No further melena at this time. Cardiology wants to make sure that the other anticoagulant can be restarted.  Physical no distress  Heart regular rhythm  Lungs clear  Abdomen soft and nontender  Impression: Melena  Plan: EGD

## 2018-03-20 ENCOUNTER — Encounter (HOSPITAL_COMMUNITY): Payer: Self-pay | Admitting: Gastroenterology

## 2018-03-20 DIAGNOSIS — K2901 Acute gastritis with bleeding: Principal | ICD-10-CM

## 2018-03-20 DIAGNOSIS — D62 Acute posthemorrhagic anemia: Secondary | ICD-10-CM

## 2018-03-20 LAB — CBC
HCT: 30.5 % — ABNORMAL LOW (ref 39.0–52.0)
HEMOGLOBIN: 9.5 g/dL — AB (ref 13.0–17.0)
MCH: 29 pg (ref 26.0–34.0)
MCHC: 31.1 g/dL (ref 30.0–36.0)
MCV: 93 fL (ref 78.0–100.0)
Platelets: 85 10*3/uL — ABNORMAL LOW (ref 150–400)
RBC: 3.28 MIL/uL — AB (ref 4.22–5.81)
RDW: 15.7 % — ABNORMAL HIGH (ref 11.5–15.5)
WBC: 6.3 10*3/uL (ref 4.0–10.5)

## 2018-03-20 LAB — BASIC METABOLIC PANEL
ANION GAP: 8 (ref 5–15)
BUN: 42 mg/dL — ABNORMAL HIGH (ref 8–23)
CALCIUM: 8.8 mg/dL — AB (ref 8.9–10.3)
CO2: 22 mmol/L (ref 22–32)
CREATININE: 1.3 mg/dL — AB (ref 0.61–1.24)
Chloride: 114 mmol/L — ABNORMAL HIGH (ref 98–111)
GFR calc non Af Amer: 46 mL/min — ABNORMAL LOW (ref >60)
GFR, EST AFRICAN AMERICAN: 54 mL/min — AB (ref >60)
Glucose, Bld: 116 mg/dL — ABNORMAL HIGH (ref 70–99)
Potassium: 4.1 mmol/L (ref 3.5–5.1)
SODIUM: 144 mmol/L (ref 135–145)

## 2018-03-20 LAB — HEMOGLOBIN AND HEMATOCRIT, BLOOD
HCT: 34.3 % — ABNORMAL LOW (ref 39.0–52.0)
Hemoglobin: 10.6 g/dL — ABNORMAL LOW (ref 13.0–17.0)

## 2018-03-20 MED ORDER — POLYETHYLENE GLYCOL 3350 17 G PO PACK: 17.0000 g | PACK | Freq: Every day | ORAL | Status: DC | PRN

## 2018-03-20 MED ORDER — SENNA 8.6 MG PO TABS: 1.0000 | ORAL_TABLET | Freq: Every day | ORAL | Status: DC | PRN

## 2018-03-20 NOTE — Progress Notes (Signed)
The patient is doing well today and states he feels great. His daughter says he looks and is acting great. There was a 1 g drop in hemoglobin of uncertain etiology. Not enough yet to establish a trend. We will continue to observe for further bleeding.

## 2018-03-20 NOTE — Progress Notes (Signed)
Family Medicine Teaching Service Daily Progress Note Intern Pager: [phone]  Patient name: [name] Medical record number: [id number] Date of birth: Aug 24, 1923 Age: 84 y.o. Gender: male  Primary Care Provider: Leeanne Rio, MD Consultants: GI, Cards Code Status: Full  [name]:  9/6 Admitted 9/8 EGD  Assessment and Plan: 84 year old man with PMH CAD s/p PCI/ DES for NSTEMI in 03/06/2018, PAD, hx CVA, sCHF, HTN, HLD, afib who presented with symptomatic anemia secondary to GI bleeding while on aspirin, plavix and xarelto.   Symptomatic anemia, improving  - No melena since admission, low BP stable, no further symptoms of presyncope  - At the time of admission hgb 9.2 down from 13.8 one week prior to admission associated with symptoms of anemia and hypotension  - Hgb 10.6 (9/8) > 9.5 (9/9) - Hemoglobin goal is 10 for recent ACS   Upper GI bleeding: EGD 03/19/2018 showed gastritis, normal esophagus and duodenum  - PO Protonix - GI following - continue to hold aspirin and xarelto - per cardiology may restart in a few days, consider restarting Xarelto at lower dose - H. Pylori serology ordered 03/20/2018  CAD: s/p DES to RCA 03/06/2018, history of Hyperlipidemia  - Cardiology recommends continuing plavix, will continue to hold Aspirin and Xarelto - Continue Pravastatin - Metoprolol restarted  Persistent Afib: Rate controlled - Metoprolol restarted - CHADS VASc 7, continue to hold xarelto which is for stroke ppx in the setting of GI bleed as mentioned above  - continue tele monitoring   Acute kidney injury on CKD 3  - resolved   HFrEF: with history of Hypertension  - remains euvolemic on exam  - continue to hold home losartan and lasix in the setting of acute symptomatic anemia   Chronic ITP  - platelets 82 > 85 (9/9), baseline ~100  - continue to monitor CBC   FEN/GI: clear liquid diet will be resumed after EGD, IV protonix  gtt PPx: SCDs, on Plavix   Disposition: tele, transfer home with stable hemoglobin  Subjective:  Patient is seen this morning sitting upright in his recliner eating breakfast.  He states he feels much better today and has a lot of energy.  He also reports that he had a bowel movement yesterday for the first time in about a week.  Today he denies headaches, dizziness, fatigue, chest pain, abdominal pain, nausea, vomiting, diarrhea, and weakness.  Objective: Temp:  [97.7 F (36.5 C)-98.4 F (36.9 C)] 97.9 F (36.6 C) (09/09 0818) Pulse Rate:  [68-95] 75 (09/09 0818) Resp:  [12-25] 16 (09/09 0533) BP: (59-132)/(20-85) 110/63 (09/09 0818) SpO2:  [96 %-100 %] 98 % (09/09 0818) Weight:  [75.3 kg] 75.3 kg (09/08 0910)  Physical Exam  Constitutional: He is oriented to person, place, and time. He appears well-developed and well-nourished. No distress.  HENT:  Head: Normocephalic and atraumatic.  Eyes: Conjunctivae and EOM are normal.  Neck: Normal range of motion. Neck supple.  Cardiovascular: Normal rate and normal heart sounds.  A. fib  Pulmonary/Chest: Effort normal and breath sounds normal. No respiratory distress. He has no wheezes.  Abdominal: Soft. Bowel sounds are normal. He exhibits no distension.  Musculoskeletal: Normal range of motion.  Neurological: He is alert and oriented to person, place, and time.  Skin: Skin is warm. Capillary refill takes less than 2 seconds.  Psychiatric: He has a normal mood and affect.   Laboratory: Recent Labs  Lab 03/18/18 [phone]  03/19/18 9798  03/19/18 1703 03/20/18 0504  WBC 6.0  --  5.4  --  6.3  HGB 10.3*   < > 9.8* 10.6* 9.5*  HCT 32.4*   < > 31.5* 34.2* 30.5*  PLT 91*  --  82*  --  85*   < > = values in this interval not displayed.   Recent Labs  Lab 03/17/18 1045  03/18/18 0429 03/19/18 0704 03/20/18 0504  NA 142   < > 145 146* 144  K 4.5   < > 4.2 4.2 4.1  CL 106   < > 112* 114* 114*  CO2 25   < > 26 22 22   BUN 115*   < >  84* 53* 42*  CREATININE 1.94*   < > 1.57* 1.27* 1.30*  CALCIUM 9.6   < > 9.0 8.8* 8.8*  PROT 6.1  --   --   --   --   BILITOT 0.9  --   --   --   --   ALKPHOS 82  --   --   --   --   ALT 32  --   --   --   --   AST 24  --   --   --   --   GLUCOSE 119*   < > 114* 103* 116*   < > = values in this interval not displayed.    Imaging/Diagnostic Tests: EGD 9/8: - Normal esophagus and duodenum, Acute gastritis. - Recs: Resume regular diet, continue current medications. - No active bleeding. Would continue PPI. Hold Xarelto for a few more days. Check serology for H pylori.  Dg Chest 2 View  Result Date: 03/03/2018 CLINICAL DATA:  Shortness of breath since 1500 hours, history coronary disease post MI, hypertension EXAM: CHEST - 2 VIEW COMPARISON:  09/13/2017 FINDINGS: Enlargement of cardiac silhouette. Mediastinal contours and pulmonary vascularity normal. Atherosclerotic calcifications aorta. RIGHT basilar atelectasis versus infiltrate. Remaining lungs clear. No pleural effusion or pneumothorax. Bones unremarkable. IMPRESSION: Enlargement of cardiac silhouette. RIGHT basilar atelectasis versus infiltrate. Electronically Signed   By: Lavonia Dana M.D.   On: 03/03/2018 18:06   Daisy Floro, DO 03/20/2018, 8:19 AM PGY-1, [place] Intern pager: [phone], text pages welcome

## 2018-03-20 NOTE — Care Management Important Message (Signed)
Important Message  Patient Details  Name: [name] MRN: [id number] Date of Birth: 1923/11/03   Medicare Important Message Given:  Yes    Salimatou Simone Montine Circle 03/20/2018, 4:06 PM

## 2018-03-21 ENCOUNTER — Inpatient Hospital Stay (HOSPITAL_COMMUNITY): Payer: Medicare Other

## 2018-03-21 DIAGNOSIS — R42 Dizziness and giddiness: Secondary | ICD-10-CM

## 2018-03-21 LAB — H. PYLORI ANTIGEN, STOOL: H. PYLORI STOOL AG, EIA: NEGATIVE

## 2018-03-21 LAB — HEMOGLOBIN AND HEMATOCRIT, BLOOD
HCT: 29.7 % — ABNORMAL LOW (ref 39.0–52.0)
HCT: 31.4 % — ABNORMAL LOW (ref 39.0–52.0)
Hemoglobin: 9.2 g/dL — ABNORMAL LOW (ref 13.0–17.0)
Hemoglobin: 9.8 g/dL — ABNORMAL LOW (ref 13.0–17.0)

## 2018-03-21 MED ORDER — FAMOTIDINE 20 MG PO TABS
20.0000 mg | ORAL_TABLET | Freq: Two times a day (BID) | ORAL | Status: DC
  Administered 2018-03-21 – 2018-03-22 (×3): 20 mg via ORAL
  Filled 2018-03-21 (×3): qty 1

## 2018-03-21 NOTE — Progress Notes (Signed)
He is doing well. No signs of bleeding. H and H stable. I think he can go home. Hold Xeralto a few more days. Check on the H pylori. Follow up with clinic.

## 2018-03-21 NOTE — Procedures (Signed)
ELECTROENCEPHALOGRAM REPORT   Patient: [name]       Room #: 3H78X EEG No. ID: [id number] Age: 84 y.o.        Sex: male Referring Physician: Mingo Amber Report Date:  03/21/2018        Interpreting Physician: Alexis Goodell  History: [name] is an 84 y.o. male with episodic lightheadedness and dizziness  Medications:  Plavix, Pepcid, Claritin, Lopressor, Protonix, Lyrica  Conditions of Recording:  This is a 21 channel routine scalp EEG performed with bipolar and monopolar montages arranged in accordance to the international 10/20 system of electrode placement. One channel was dedicated to EKG recording.  The patient is in the awake, drowsy and asleep states.  Description:  The waking background activity consists of a low voltage, symmetrical, fairly well organized, 6-7 Hz theta activity, seen from the parieto-occipital and posterior temporal regions.  Low voltage fast activity, poorly organized, is seen anteriorly and is at times superimposed on more posterior regions.  A mixture of theta and alpha rhythms are seen from the central and temporal regions. The patient drowses with slowing to irregular, low voltage theta and beta activity.   The patient goes in to a light sleep with symmetrical sleep spindles, vertex central sharp transients and irregular slow activity.   No epileptiform activity is noted.   Hyperventilation and intermittent photic stimulation were not performed.  IMPRESSION: This is an abnormal EEG secondary to posterior background slowing.  This finding may be seen with a diffuse gray matter disturbance that is etiologically nonspecific, but may include a dementia, among other possibilities.  No epileptiform activity is noted.     Alexis Goodell, MD Neurology [phone] 03/21/2018, 5:15 PM

## 2018-03-21 NOTE — Progress Notes (Signed)
EEG completed; results pending.    

## 2018-03-21 NOTE — Progress Notes (Signed)
Family Medicine Teaching Service Daily Progress Note Intern Pager: [phone]  Patient name: [name] Medical record number: [id number] Date of birth: 13-Sep-1923 Age: 84 y.o. Gender: male  Primary Care Provider: Leeanne Rio, MD Consultants: GI, Cards Code Status: Full  [name]:  9/6 Admitted 9/8 EGD  Assessment and Plan: 84 year old man with PMH CAD s/p PCI/ DES for NSTEMI in 03/06/2018, PAD, hx CVA, sCHF, HTN, HLD, afib who presented with symptomatic anemia secondary to GI bleeding while on aspirin, plavix and xarelto.   Symptomatic anemia, improving  - No melena since admission,low BPstable, no further symptoms of presyncope  -At the time of admissionhgb 9.2 down from 13.8 one week prior to admission associated with symptoms of anemia and hypotension  - Hgb 10.6 (9/8) > 9.5 (9/9) > 10.6 (9/9) > 9.2 (9/10) - Hemoglobin goal is 10 for recent ACS  UpperGI bleeding: EGD 03/19/2018 showed gastritis, normal esophagus and duodenum  - PO Protonix, added PO Famotidine 9/10 - GI following - Continue to hold aspirin and xarelto - per cardiology may restart in a few days, consider restarting Xarelto at lower dose - H. Pylori serology ordered 03/20/2018  Episodes of Dizziness: patient had 2 episodes of feeling lightheaded and dizzy, as if he might "float away" and his tongue felt heavy. Differential Diagnosis includes hypotension (BP at the time was 104/59), a cardiac arrythmia dropping cerebral perfusion, TIA because pt has afib and is no longer on Xarelto, and Seizures. - Order MRI, EEG  - Neuro consult  CAD: s/p DES to RCA 03/06/2018, history of Hyperlipidemia  - Cardiology recommends continuing plavix, will continue to hold Aspirin and Xarelto - Continue Pravastatin - Metoprolol restarted  Persistent Afib: Rate controlled - Metoprolol restarted - CHADS VASc 5 (high risk of stroke); HAS-BLED 3 (high risk of bleed) - Continue to hold  Xarelto for stroke ppx in the setting of GI bleed as mentioned above  - continue tele monitoring   Acute kidney injury on CKD 3  -Resolved  HFrEF: with history of Hypertension  - remains euvolemic on exam  - continue to hold home losartan and lasix in the setting of acute symptomatic anemia   Chronic ITP  - platelets82 > 85 (9/9),baseline ~100  - continue to monitor CBC   FEN/GI: clear liquid dietwill be resumed after EGD, IV protonixgtt PPx: SCDs, on Plavix   Disposition:tele, transfer home with stable hemoglobin  Subjective:  Patient is seen this morning sitting upright in his recliner.  His 2 daughters are in the room with him.  He reports feeling well this morning, even better than yesterday.  He denies headaches, dizziness, chest pain, shortness of breath, nausea, vomiting, diarrhea, constipation, and weaknesses.  Around 1 PM the patient was sitting in his recliner when he had an episode of "feeling like he might fly away", and that he felt dizzy and lightheaded and as if his tongue felt heavy. At the time he had only had a few crackers to eat recently and some cookies earlier than that, but blood glucose was not drawn. His BP was 104/59 at the time. He had been sitting in his recliner and had not recently been ambulating. The episode self resolved within 10 minutes and the patient returned to normal.  Objective: Temp:  [97.7 F (36.5 C)-98.2 F (36.8 C)] 98.1 F (36.7 C) (09/10 1300) Pulse Rate:  [69-95] 71 (09/10 1300) Resp:  [16-18] 18 (09/10 1300) BP: (103-110)/(56-69) 104/59 (09/10  1300) SpO2:  [96 %-100 %] 97 % (09/10 1300)  Physical Exam  Constitutional: He is oriented to person, place, and time. He appears well-developed and well-nourished. No distress.  HENT:  Head: Normocephalic and atraumatic.  Eyes: Conjunctivae and EOM are normal.  Neck: Normal range of motion.  Cardiovascular:  Irregularly irregular, rate controlled  Pulmonary/Chest: Effort  normal and breath sounds normal.  Abdominal: Soft. Bowel sounds are normal. He exhibits no distension. There is no tenderness.  Musculoskeletal: Normal range of motion. He exhibits no edema.  Neurological: He is alert and oriented to person, place, and time.  Skin: Skin is warm. Capillary refill takes less than 2 seconds.  Psychiatric: He has a normal mood and affect. His behavior is normal.   Laboratory: Recent Labs  Lab 03/18/18 0429  03/19/18 0704  03/20/18 0504 03/20/18 1627 03/21/18 0354  WBC 6.0  --  5.4  --  6.3  --   --   HGB 10.3*   < > 9.8*   < > 9.5* 10.6* 9.2*  HCT 32.4*   < > 31.5*   < > 30.5* 34.3* 29.7*  PLT 91*  --  82*  --  85*  --   --    < > = values in this interval not displayed.   Recent Labs  Lab 03/17/18 1045  03/18/18 0429 03/19/18 0704 03/20/18 0504  NA 142   < > 145 146* 144  K 4.5   < > 4.2 4.2 4.1  CL 106   < > 112* 114* 114*  CO2 25   < > 26 22 22   BUN 115*   < > 84* 53* 42*  CREATININE 1.94*   < > 1.57* 1.27* 1.30*  CALCIUM 9.6   < > 9.0 8.8* 8.8*  PROT 6.1  --   --   --   --   BILITOT 0.9  --   --   --   --   ALKPHOS 82  --   --   --   --   ALT 32  --   --   --   --   AST 24  --   --   --   --   GLUCOSE 119*   < > 114* 103* 116*   < > = values in this interval not displayed.    H. Pylori Stool Ag (03/20/2018): pending H. Pylori IgM, IgG, IgA Antibodies (03/20/2018): pending  Imaging/Diagnostic Tests: EGD 9/8: - Normal esophagus and duodenum, Acute gastritis. - Recs: Resume regular diet, continue current medications. - No active bleeding. Would continue PPI. Hold Xarelto for a few more days. Check serology for H pylori.  Daisy Floro, DO 03/21/2018, 1:59 PM PGY-1, [place] Intern pager: [phone], text pages welcome

## 2018-03-22 ENCOUNTER — Inpatient Hospital Stay (HOSPITAL_COMMUNITY): Payer: Medicare Other

## 2018-03-22 DIAGNOSIS — I5032 Chronic diastolic (congestive) heart failure: Secondary | ICD-10-CM

## 2018-03-22 LAB — H PYLORI, IGM, IGG, IGA AB
H Pylori IgG: 0.73 Index Value (ref 0.00–0.79)
H. Pylogi, Igm Abs: <9 units (ref 0.0–8.9)

## 2018-03-22 LAB — CBC
HEMATOCRIT: 30.4 % — AB (ref 39.0–52.0)
Hemoglobin: 9.4 g/dL — ABNORMAL LOW (ref 13.0–17.0)
MCH: 28.8 pg (ref 26.0–34.0)
MCHC: 30.9 g/dL (ref 30.0–36.0)
MCV: 93.3 fL (ref 78.0–100.0)
Platelets: 75 10*3/uL — ABNORMAL LOW (ref 150–400)
RBC: 3.26 MIL/uL — ABNORMAL LOW (ref 4.22–5.81)
RDW: 15.9 % — AB (ref 11.5–15.5)
WBC: 4.7 10*3/uL (ref 4.0–10.5)

## 2018-03-22 MED ORDER — IOHEXOL 350 MG/ML SOLN: 50.0000 mL | Freq: Once | INTRAVENOUS | Status: DC | PRN

## 2018-03-22 MED ORDER — IOPAMIDOL (ISOVUE-370) INJECTION 76%
INTRAVENOUS | Status: AC
  Administered 2018-03-22: 10:00:00
  Filled 2018-03-22: qty 50

## 2018-03-22 MED ORDER — POLYETHYLENE GLYCOL 3350 17 G PO PACK: 17.0000 g | PACK | Freq: Every day | ORAL | 0 refills | Status: DC | PRN

## 2018-03-22 MED ORDER — PANTOPRAZOLE SODIUM 40 MG PO TBEC: 40.0000 mg | DELAYED_RELEASE_TABLET | Freq: Two times a day (BID) | ORAL | 0 refills | Status: DC

## 2018-03-22 MED ORDER — FAMOTIDINE 20 MG PO TABS: 20.0000 mg | ORAL_TABLET | Freq: Two times a day (BID) | ORAL | 0 refills | Status: DC

## 2018-03-22 MED ORDER — IOPAMIDOL (ISOVUE-370) INJECTION 76%
50.0000 mL | Freq: Once | INTRAVENOUS | Status: AC | PRN
  Administered 2018-03-22: 50 mL via INTRAVENOUS

## 2018-03-22 MED ORDER — SENNA 8.6 MG PO TABS: 1.0000 | ORAL_TABLET | Freq: Every day | ORAL | 0 refills | Status: DC | PRN

## 2018-03-22 NOTE — Evaluation (Signed)
Physical Therapy Evaluation Patient Details Name: [name] MRN: [id number] DOB: 29-Jun-1924 Today's Date: 03/22/2018   History of Present Illness  Pt is a 84 y.o. M with significant PMH for CAD s/p PCI for NSTEMI in 03/06/2018, PAD, hx CVA, CHF, HTN, afib who presented with symptomatic anemia secondary to GI bleeding. EGD shows no active bleeding.  Clinical Impression  Pt admitted with above diagnosis. Pt currently with functional limitations due to the deficits listed below (see PT Problem List). Patient is independent at baseline, lives alone, and has excellent family support from children. Currently ambulating 150 feet with no assistive device and up to minimal assistance for balance. Demonstrates deficits in both static and dynamic balance. Denies dizziness or lightheadedness throughout. Pt will benefit from skilled PT to increase their independence and safety with mobility to allow discharge to the venue listed below.        Follow Up Recommendations Outpatient PT;Supervision for mobility/OOB (pt daughter declining follow up for pt, however, this is still my recommendation)    Equipment Recommendations  None recommended by PT    Recommendations for Other Services       Precautions / Restrictions Precautions Precautions: Fall Restrictions Weight Bearing Restrictions: No      Mobility  Bed Mobility               General bed mobility comments: OOB in recliner  Transfers Overall transfer level: Modified independent                  Ambulation/Gait Ambulation/Gait assistance: Min guard;Min assist Gait Distance (Feet): 150 Feet Assistive device: None Gait Pattern/deviations: Step-through pattern;Decreased stride length;Decreased step length - left;Trunk flexed Gait velocity: decreased   General Gait Details: patient with trunk flexed throughout gait (baseline) and one episode of lateral loss of balance with cross over step requiring min assist by PT  to correct  Stairs            Wheelchair Mobility    Modified Rankin (Stroke Patients Only)       Balance Overall balance assessment: Needs assistance Sitting-balance support: No upper extremity supported;Feet supported Sitting balance-Leahy Scale: Good     Standing balance support: No upper extremity supported;During functional activity Standing balance-Leahy Scale: Fair       Tandem Stance - Right Leg: 0   Rhomberg - Eyes Opened: 10                   Pertinent Vitals/Pain Pain Assessment: No/denies pain    Home Living Family/patient expects to be discharged to:: Private residence Living Arrangements: Alone Available Help at Discharge: Family Type of Home: House Home Access: Stairs to enter Entrance Stairs-Rails: Psychiatric nurse of Steps: 6 Home Layout: One level Home Equipment: Cane - single point      Prior Function Level of Independence: Independent with assistive device(s)         Comments: uses cane for outdoor ambulation, drives, [employer] manager        Extremity/Trunk Assessment   Upper Extremity Assessment Upper Extremity Assessment: Overall WFL for tasks assessed    Lower Extremity Assessment Lower Extremity Assessment: Overall WFL for tasks assessed    Cervical / Trunk Assessment Cervical / Trunk Assessment: Kyphotic  Communication   Communication: HOH  Cognition Arousal/Alertness: Awake/alert Behavior During Therapy: WFL for tasks assessed/performed Overall Cognitive Status: Within Functional Limits for tasks assessed  General Comments: pt daughter tending to speak for him      General Comments General comments (skin integrity, edema, etc.): pt daughter present    Exercises     Assessment/Plan    PT Assessment Patient needs continued PT services  PT Problem List Decreased strength;Decreased activity tolerance;Decreased  balance;Decreased mobility       PT Treatment Interventions DME instruction;Functional mobility training;Gait training;Stair training;Therapeutic activities;Therapeutic exercise;Balance training;Patient/family education    PT Goals (Current goals can be found in the Care Plan section)  Acute Rehab PT Goals Patient Stated Goal: go home PT Goal Formulation: With patient/family Time For Goal Achievement: 04/05/18 Potential to Achieve Goals: Good    Frequency Min 3X/week   Barriers to discharge        Co-evaluation               AM-PAC PT "6 Clicks" Daily Activity  Outcome Measure Difficulty turning over in bed (including adjusting bedclothes, sheets and blankets)?: None Difficulty moving from lying on back to sitting on the side of the bed? : None Difficulty sitting down on and standing up from a chair with arms (e.g., wheelchair, bedside commode, etc,.)?: None Help needed moving to and from a bed to chair (including a wheelchair)?: A Little Help needed walking in hospital room?: A Little Help needed climbing 3-5 steps with a railing? : A Little 6 Click Score: 21    End of Session Equipment Utilized During Treatment: Gait belt Activity Tolerance: Patient tolerated treatment well Patient left: in chair;with call bell/phone within reach;with family/visitor present Nurse Communication: Mobility status PT Visit Diagnosis: Unsteadiness on feet (R26.81);Other abnormalities of gait and mobility (R26.89);Difficulty in walking, not elsewhere classified (R26.2)    Time: 1410-3013 PT Time Calculation (min) (ACUTE ONLY): 14 min   Charges:   PT Evaluation $PT Eval Moderate Complexity: 1 Mod        Ellamae Sia, [place], DPT Acute Rehabilitation Services Pager [phone] Office [phone]   Willy Eddy 03/22/2018, 1:10 PM

## 2018-03-22 NOTE — Discharge Instructions (Signed)
Please only take your plavix after discharge, your follow up providers will restart Xarelto as needed. Please go to your follow up appointments.

## 2018-03-22 NOTE — Discharge Summary (Signed)
[facility] Discharge Summary  Patient name: [name] Medical record number: [id number] Date of birth: 02/12/1924 Age: 84 y.o. Gender: male Date of Admission: 03/17/2018  Date of Discharge: 03/22/2018 Admitting Physician: [name], MD  Primary Care Provider: Leeanne Rio, MD Consultants: GI, Cards, Neuro  Indication for Hospitalization: Symptomatic Anemia  Discharge Diagnoses/Problem List:  CAD s/p PCI/DES for NSTEMI PAD  CVA CHF HTN HLD A. fib  Disposition: Discharge home  Discharge Condition: Stable  Discharge Exam:  Physical Exam  Constitutional: He is oriented to person, place, and time. He appears well-developed and well-nourished. No distress.  HENT:  Head: Normocephalic and atraumatic.  Eyes: EOM are normal. No scleral icterus.  Neck: Normal range of motion.  Cardiovascular: Intact distal pulses.  Murmur (Grade 2 systolic) heard. Irregularly irregular, rate controlled  Pulmonary/Chest: Effort normal and breath sounds normal. No respiratory distress.  Abdominal: Soft. Bowel sounds are normal. He exhibits no distension.  Musculoskeletal: Normal range of motion. He exhibits no edema or deformity.  Neurological: He is alert and oriented to person, place, and time.  Skin: Skin is warm. Capillary refill takes less than 2 seconds.  Psychiatric: He has a normal mood and affect. His behavior is normal.   Brief Hospital Course:  [name] is a 84 year old male admitted 03/17/2018 for symptomatic anemia secondary to recently starting aspirin, plavix, and Xarelto 15mg  after a PCI/DES. On admission hemoglobin was 9.3 (baseline 13) and FOBT was positive. Elevated BUN:Cr Ratio of 115:1.94 were consistent with upper GI bleed. Cardiology was consulted and recommended stopping aspirin and Xarelto while continuing Plavix. The patient was transfused 2 units PRBC on 03/17/2018, hemoglobin improved and remained stable throughout  the rest of the hospital admission. The patient had soft blood pressure on admission, so Metoprolol, Lasix, and Losartan were initially held. Metoprolol was restarted at half dose for A. Fib. GI was consulted. An EGD was performed 03/19/2018 and showed gastritis without active bleed, for which the patient was started on Famotidine and Protonix. Due to the patient being on Plavix at the time of the EGD a biopsy was not taken to test for. H. Pylori, so H. Pylori stool antigen and IgG, IgM, and IgA antibodies were ordered.  On 9/10 the patient was set to discharge when he reported having a brief episode of dizziness, lightheadedness, "feeling like he might fly away", and tongue heaviness. Neurology was consulted and head MRI, Head/Neck CTA, and EEG were ordered. These tests showed signs of chronic disease without acute process. The patient no long had any episodes of lightheadedness and he was medically cleared for discharge with close outpatient follow up on 03/22/2018.  Issues for Follow Up:  1. May restart Xarelto at lower dose 2.5mg  BID with Aspirin 81. 2. Follow up with neurology outpatient regarding episode of lightheadedness. 3. Restart blood pressure medicines as tolerated (Lasix; Metoprolol was restarted at 12.5 BID, originally on 25mg  BID). 4. Ensure patient follows up with cardiology. 5. No indication from GI for a follow up.  6. May be able to de-escalate PPI and H2-Blocker therapy, which are currently at twice daily dosing.  Significant Procedures:  EGD - 9/8 EEG - 9/10  Significant Labs and Imaging:  Recent Labs  Lab 03/19/18 0704  03/20/18 0504  03/21/18 0354 03/21/18 1629 03/22/18 0319  WBC 5.4  --  6.3  --   --   --  4.7  HGB 9.8*   < > 9.5*   < >  9.2* 9.8* 9.4*  HCT 31.5*   < > 30.5*   < > 29.7* 31.4* 30.4*  PLT 82*  --  85*  --   --   --  75*   < > = values in this interval not displayed.   Recent Labs  Lab 03/17/18 1045 03/17/18 1356 03/18/18 0429 03/19/18 0704  03/20/18 0504  NA 142 141 145 146* 144  K 4.5 4.3 4.2 4.2 4.1  CL 106 108 112* 114* 114*  CO2 25 24 26 22 22   GLUCOSE 119* 150* 114* 103* 116*  BUN 115* 113* 84* 53* 42*  CREATININE 1.94* 1.99* 1.57* 1.27* 1.30*  CALCIUM 9.6 9.1 9.0 8.8* 8.8*  ALKPHOS 82  --   --   --   --   AST 24  --   --   --   --   ALT 32  --   --   --   --   ALBUMIN 3.9  --   --   --   --     Ct Angio Head W Or Wo Contrast  Result Date: 03/22/2018 CLINICAL DATA:  84 y/o  M; syncope with normal neuro exam. EXAM: CT ANGIOGRAPHY HEAD AND NECK TECHNIQUE: Multidetector CT imaging of the head and neck was performed using the standard protocol during bolus administration of intravenous contrast. Multiplanar CT image reconstructions and MIPs were obtained to evaluate the vascular anatomy. Carotid stenosis measurements (when applicable) are obtained utilizing NASCET criteria, using the distal internal carotid diameter as the denominator. CONTRAST:  27mL ISOVUE-370 IOPAMIDOL (ISOVUE-370) INJECTION 76% COMPARISON:  08/09/2016 CT head.  08/10/2016 MRI and MRA head. FINDINGS: CT HEAD FINDINGS Brain: Right posterior frontal, right inferolateral frontal, left lateral temporal, bilateral parietal, and left occipital areas of encephalomalacia compatible with chronic infarction. No acute stroke, hemorrhage, or focal mass effect. Advanced chronic microvascular ischemic changes and moderate volume loss of the brain. Vascular: As below. Skull: Normal. Negative for fracture or focal lesion. Sinuses: Mild right maxillary sinus mucosal thickening. Visualized paranasal sinuses and mastoid air cells are otherwise normally aerated. Orbits: Bilateral intra-ocular lens replacement. Review of the MIP images confirms the above findings CTA NECK FINDINGS Aortic arch: Standard branching. Imaged portion shows no evidence of aneurysm or dissection. No significant stenosis of the major arch vessel origins. Moderate calcific atherosclerosis of the thoracic aorta.  Right carotid system: No evidence of dissection, stenosis (50% or greater) or occlusion. Mild non stenotic calcific atherosclerosis of the carotid bifurcation. Left carotid system: No evidence of dissection, stenosis (50% or greater) or occlusion. Mild non stenotic calcific atherosclerosis of the carotid bifurcation. Vertebral arteries: Codominant. No evidence of dissection, stenosis (50% or greater) or occlusion. Skeleton: Advanced cervical spondylosis with multilevel disc and facet degenerative changes. C3-C5 as well as C6-7 partial interbody fusion. No high-grade bony canal stenosis. Other neck: Negative. Upper chest: Negative. Review of the MIP images confirms the above findings CTA HEAD FINDINGS Anterior circulation: No significant stenosis, proximal occlusion, aneurysm, or vascular malformation. Posterior circulation: Left V4 calcified plaque with mild stenosis. No large vessel occlusion, aneurysm, or significant stenosis is identified. Venous sinuses: As permitted by contrast timing, patent. Left temporal DVA. Anatomic variants: Patent anterior and right posterior communicating arteries. No left posterior communicating artery identified, likely hypoplastic or absent. Review of the MIP images confirms the above findings IMPRESSION: 1. Patent carotid and vertebral arteries. No dissection, aneurysm, or hemodynamically significant stenosis utilizing NASCET criteria. 2. Patent anterior and posterior intracranial circulation. No large vessel  occlusion, aneurysm, or significant stenosis. 3. No acute intracranial abnormality on noncontrast CT of head. 4. Calcific atherosclerosis of the aorta, carotid bifurcations, carotid siphons, and left V4 segment without significant stenosis. 5. Advanced spondylosis of cervical spine. No high-grade bony canal stenosis. Electronically Signed   By: Kristine Garbe M.D.   On: 03/22/2018 02:09   Dg Chest 2 View  Result Date: 03/03/2018 CLINICAL DATA:  Shortness of breath  since 1500 hours, history coronary disease post MI, hypertension EXAM: CHEST - 2 VIEW COMPARISON:  09/13/2017 FINDINGS: Enlargement of cardiac silhouette. Mediastinal contours and pulmonary vascularity normal. Atherosclerotic calcifications aorta. RIGHT basilar atelectasis versus infiltrate. Remaining lungs clear. No pleural effusion or pneumothorax. Bones unremarkable. IMPRESSION: Enlargement of cardiac silhouette. RIGHT basilar atelectasis versus infiltrate. Electronically Signed   By: Lavonia Dana M.D.   On: 03/03/2018 18:06   Ct Angio Neck W Or Wo Contrast  Result Date: 03/22/2018 CLINICAL DATA:  84 y/o  M; syncope with normal neuro exam. EXAM: CT ANGIOGRAPHY HEAD AND NECK TECHNIQUE: Multidetector CT imaging of the head and neck was performed using the standard protocol during bolus administration of intravenous contrast. Multiplanar CT image reconstructions and MIPs were obtained to evaluate the vascular anatomy. Carotid stenosis measurements (when applicable) are obtained utilizing NASCET criteria, using the distal internal carotid diameter as the denominator. CONTRAST:  60mL ISOVUE-370 IOPAMIDOL (ISOVUE-370) INJECTION 76% COMPARISON:  08/09/2016 CT head.  08/10/2016 MRI and MRA head. FINDINGS: CT HEAD FINDINGS Brain: Right posterior frontal, right inferolateral frontal, left lateral temporal, bilateral parietal, and left occipital areas of encephalomalacia compatible with chronic infarction. No acute stroke, hemorrhage, or focal mass effect. Advanced chronic microvascular ischemic changes and moderate volume loss of the brain. Vascular: As below. Skull: Normal. Negative for fracture or focal lesion. Sinuses: Mild right maxillary sinus mucosal thickening. Visualized paranasal sinuses and mastoid air cells are otherwise normally aerated. Orbits: Bilateral intra-ocular lens replacement. Review of the MIP images confirms the above findings CTA NECK FINDINGS Aortic arch: Standard branching. Imaged portion shows  no evidence of aneurysm or dissection. No significant stenosis of the major arch vessel origins. Moderate calcific atherosclerosis of the thoracic aorta. Right carotid system: No evidence of dissection, stenosis (50% or greater) or occlusion. Mild non stenotic calcific atherosclerosis of the carotid bifurcation. Left carotid system: No evidence of dissection, stenosis (50% or greater) or occlusion. Mild non stenotic calcific atherosclerosis of the carotid bifurcation. Vertebral arteries: Codominant. No evidence of dissection, stenosis (50% or greater) or occlusion. Skeleton: Advanced cervical spondylosis with multilevel disc and facet degenerative changes. C3-C5 as well as C6-7 partial interbody fusion. No high-grade bony canal stenosis. Other neck: Negative. Upper chest: Negative. Review of the MIP images confirms the above findings CTA HEAD FINDINGS Anterior circulation: No significant stenosis, proximal occlusion, aneurysm, or vascular malformation. Posterior circulation: Left V4 calcified plaque with mild stenosis. No large vessel occlusion, aneurysm, or significant stenosis is identified. Venous sinuses: As permitted by contrast timing, patent. Left temporal DVA. Anatomic variants: Patent anterior and right posterior communicating arteries. No left posterior communicating artery identified, likely hypoplastic or absent. Review of the MIP images confirms the above findings IMPRESSION: 1. Patent carotid and vertebral arteries. No dissection, aneurysm, or hemodynamically significant stenosis utilizing NASCET criteria. 2. Patent anterior and posterior intracranial circulation. No large vessel occlusion, aneurysm, or significant stenosis. 3. No acute intracranial abnormality on noncontrast CT of head. 4. Calcific atherosclerosis of the aorta, carotid bifurcations, carotid siphons, and left V4 segment without significant stenosis. 5. Advanced spondylosis of  cervical spine. No high-grade bony canal stenosis.  Electronically Signed   By: Kristine Garbe M.D.   On: 03/22/2018 02:09   [name] Contrast  Result Date: 03/22/2018 CLINICAL DATA:  84 y/o  M; syncope. EXAM: MRI HEAD WITHOUT CONTRAST TECHNIQUE: Multiplanar, multiecho pulse sequences of the brain and surrounding structures were obtained without intravenous contrast. COMPARISON:  03/22/2018 CTA of head. 08/10/2017 MRI and MRA of head. 08/09/2016 CT head. FINDINGS: Brain: Right posterolateral frontal, right inferolateral frontal, left lateral temporal, biparietal, and bilateral occipital foci of encephalomalacia compatible chronic infarction. Stable advanced chronic microvascular ischemic changes and moderate volume loss of the brain. There is hemosiderin staining of the chronic infarctions in the bilateral parietal and right posterolateral frontal lobes. No reduced diffusion to suggest acute or early subacute infarction. No interval susceptibility hypointensity to indicate acute hemorrhage. No focal mass effect, hydrocephalus, extra-axial collection, or herniation. Vascular: Normal flow voids. Skull and upper cervical spine: Normal marrow signal. Sinuses/Orbits: Negative. Other: Bilateral intra-ocular lens replacement. IMPRESSION: 1. No acute intracranial abnormality identified. 2. Multiple chronic infarctions, advanced chronic microvascular ischemic changes, and volume loss of the brain are stable in distribution from the prior studies. Electronically Signed   By: Kristine Garbe M.D.   On: 03/22/2018 02:16    Results/Tests Pending at Time of Discharge:  H pylori IgG, IgM, and IgA Antibodies  Discharge Medications:  Allergies as of 03/22/2018      Reactions   Prednisone Other (See Comments)   Does not feel right   Rosuvastatin Other (See Comments)   Aches      Medication List    STOP taking these medications   aspirin 81 MG EC tablet   furosemide 20 MG tablet Commonly known as:  LASIX   methocarbamol 500 MG  tablet Commonly known as:  ROBAXIN   nitroGLYCERIN 0.4 MG SL tablet Commonly known as:  NITROSTAT   potassium chloride 10 MEQ tablet Commonly known as:  K-DUR   Red Yeast Rice 600 MG Caps   Rivaroxaban 15 MG Tabs tablet Commonly known as:  XARELTO     TAKE these medications   clopidogrel 75 MG tablet Commonly known as:  PLAVIX Take 1 tablet (75 mg total) by mouth daily with breakfast.   famotidine 20 MG tablet Commonly known as:  PEPCID Take 1 tablet (20 mg total) by mouth 2 (two) times daily.   fluticasone 50 MCG/ACT nasal spray Commonly known as:  FLONASE Place 2 sprays into both nostrils daily as needed for allergies or rhinitis.   HYDROcodone-acetaminophen 5-325 MG tablet Commonly known as:  NORCO/VICODIN Take 1 tablet by mouth every 4 (four) hours as needed. What changed:  reasons to take this   loratadine 10 MG tablet Commonly known as:  CLARITIN Take 10 mg by mouth daily.   losartan 25 MG tablet Commonly known as:  COZAAR Take 0.5 tablets (12.5 mg total) by mouth daily.   metoprolol tartrate 25 MG tablet Commonly known as:  LOPRESSOR TAKE 1/2 TABLET BY MOUTH 2 TIMES DAILY What changed:  See the new instructions.   pantoprazole 40 MG tablet Commonly known as:  PROTONIX Take 1 tablet (40 mg total) by mouth 2 (two) times daily.   polyethylene glycol packet Commonly known as:  MIRALAX / GLYCOLAX Take 17 g by mouth daily as needed for mild constipation, moderate constipation or severe constipation.   pravastatin 20 MG tablet Commonly known as:  PRAVACHOL TAKE 1 TABLET (20 MG TOTAL) BY MOUTH DAILY AT 6  PM. What changed:  when to take this   pregabalin 75 MG capsule Commonly known as:  LYRICA Take 1 capsule (75 mg total) by mouth 2 (two) times daily.   senna 8.6 MG Tabs tablet Commonly known as:  SENOKOT Take 1 tablet (8.6 mg total) by mouth daily as needed for mild constipation.       Discharge Instructions: Please refer to Patient Instructions  section of EMR for full details.  Patient was counseled important signs and symptoms that should prompt return to medical care, changes in medications, dietary instructions, activity restrictions, and follow up appointments.   Follow-Up Appointments: Family Med Follow up with Dr. Mingo Amber - 03/28/2018, 10:50am Cardiology Follow up with B. Rosita Fire - 03/31/2018, 9:00am Urology with Leander Rams - 04/21/2018 Podiatry with Tretha Sciara - 06/27/2018   Daisy Floro, DO 03/22/2018, 3:17 PM PGY-1, [place]

## 2018-03-23 ENCOUNTER — Telehealth: Payer: Self-pay | Admitting: Cardiology

## 2018-03-23 NOTE — Telephone Encounter (Signed)
° °  Patient's daughter [name] calling to report patient "does not seem well" since discharge from the hospital 9/11. states patient seems a little confused, tired and sleepy. Last seen by Ellen Henri  No chest Pain Not SOB  Blood Pressures from today  94/48 HR 82   94/48 HR 83 94/43 HR 81

## 2018-03-23 NOTE — Telephone Encounter (Signed)
Spoke to patient's daughter who is concerned because patient was recently hospitalized for internal bleeding, received transfusion, and no has weakness, confusion, slurred speech.  He also has developed a rash on his abdomen, possibly caused by new start medications.  I recommended a revisit to the ED, but patient is refusing.  I was able to arrange appt with Vin on Tuesday 9/17.  The patient's daughter was thankful.

## 2018-03-24 ENCOUNTER — Telehealth: Payer: Self-pay | Admitting: *Deleted

## 2018-03-24 NOTE — Telephone Encounter (Signed)
Patient's daughter calling due to he is having swelling in his feet.  Has not had any swelling since having stent placed and being discharged from hospital.  Swelling started yesterday and they are concerned. Has cardiology appt next Tuesday and hospital followup with Mingo Amber also next Tuesday.  Wt. Was 166.4 lbs on  03/17/18 and is 173.8 lbs today.  Denies any shortness of breath.  Will route to Dr. Mingo Amber for advice due to McIntyre/Hensel out of office.  Will call daughter back. Burna Forts, BSN, RN-BC

## 2018-03-24 NOTE — Telephone Encounter (Signed)
Called and spoke to [name] his daughter.  Swelling began in feet/ankles last night.  Lasix being held since DC from hospital.  Previously on 40 mg daily.    BP has been running 65L systolic at home.  Plan is to restart 20 mg rather than 40 to help with swelling and any fluid overload he's been having but not to drop his pressure too much.  Daughter appreciated call.  I will see him on Tuesday.  Cardiology also scheduled for Tuesday.

## 2018-03-27 NOTE — Telephone Encounter (Signed)
Thank you [name].

## 2018-03-28 ENCOUNTER — Ambulatory Visit: Payer: Medicare Other | Admitting: Physician Assistant

## 2018-03-28 ENCOUNTER — Encounter: Payer: Self-pay | Admitting: Physician Assistant

## 2018-03-28 ENCOUNTER — Other Ambulatory Visit: Payer: Self-pay

## 2018-03-28 ENCOUNTER — Ambulatory Visit: Payer: Medicare Other | Admitting: Family Medicine

## 2018-03-28 ENCOUNTER — Encounter: Payer: Self-pay | Admitting: Family Medicine

## 2018-03-28 VITALS — BP 122/62 | HR 80 | Ht 70.0 in | Wt 175.0 lb

## 2018-03-28 VITALS — BP 106/62 | HR 87 | Temp 98.0°F | Ht 70.0 in | Wt 175.2 lb

## 2018-03-28 DIAGNOSIS — I951 Orthostatic hypotension: Secondary | ICD-10-CM | POA: Diagnosis not present

## 2018-03-28 DIAGNOSIS — K922 Gastrointestinal hemorrhage, unspecified: Secondary | ICD-10-CM

## 2018-03-28 DIAGNOSIS — I4892 Unspecified atrial flutter: Secondary | ICD-10-CM | POA: Diagnosis not present

## 2018-03-28 DIAGNOSIS — I214 Non-ST elevation (NSTEMI) myocardial infarction: Secondary | ICD-10-CM

## 2018-03-28 DIAGNOSIS — I482 Chronic atrial fibrillation, unspecified: Secondary | ICD-10-CM

## 2018-03-28 DIAGNOSIS — I952 Hypotension due to drugs: Secondary | ICD-10-CM

## 2018-03-28 DIAGNOSIS — I2510 Atherosclerotic heart disease of native coronary artery without angina pectoris: Secondary | ICD-10-CM | POA: Diagnosis not present

## 2018-03-28 DIAGNOSIS — I5022 Chronic systolic (congestive) heart failure: Secondary | ICD-10-CM

## 2018-03-28 DIAGNOSIS — I481 Persistent atrial fibrillation: Secondary | ICD-10-CM | POA: Diagnosis not present

## 2018-03-28 DIAGNOSIS — I4819 Other persistent atrial fibrillation: Secondary | ICD-10-CM

## 2018-03-28 MED ORDER — RIVAROXABAN 2.5 MG PO TABS: 2.5000 mg | ORAL_TABLET | Freq: Two times a day (BID) | ORAL | 1 refills | Status: DC

## 2018-03-28 MED ORDER — FUROSEMIDE 20 MG PO TABS: ORAL_TABLET | ORAL | 3 refills | Status: DC

## 2018-03-28 NOTE — Progress Notes (Signed)
Cardiology Office Note    Date:  03/28/2018   ID:  [name], DOB Nov 26, 1923, MRN [id number]  PCP:  Leeanne Rio, MD  Cardiologist:  Dr. Angelena Form  Chief Complaint: Hospital follow up   History of Present Illness:   [name] is a 84 y.o. male  with a PMH of CAD s/p recent NSTEMI with PCI/DES to OM 03/06/18, atrial fibrillation on xarelto, chronic systolic CHF (EF 74-12%), HLD, and CVA presents for follow up.   Admitted to the hospital from 03/03/18-03/07/18 after presenting with an NSTEMI. He underwent a LHC on 03/06/18 and was found to have CTO of RCA with bidirectional collaterals (similar to prior cath), 99% stenosis of OM 1 managed with PCI/DES, and patent LAD stents. He was recommended for triple therapy with aspirin x1 month, plavix, and xarelto for afib.   However presents with near syncope/hypotension, found to have a GI bleed.  Off Xarelto and ASA.  Continues Plavix.  Transfused. An EGD was performed 03/19/2018 and showed gastritis without active bleed, for which the patient was started on Famotidine and Protonix. Antihypertensive adjusted for low BP. Had dizziness episode. Neurology was consulted and head MRI, Head/Neck CTA, and EEG were ordered. These tests showed signs of chronic disease without acute process.   Here today follow up with daughter.  Denies recurrent bleeding.  Blood pressures been running in 90s over 60 at home.  No dizziness, chest pain, shortness of breath, palpitation, syncope or melena.  He has noted intermittent orthopnea and lower extremity edema.  Based on home scale he has gained 2 to 3 pounds.  He has not started his Xarelto yet.  Has appointment with PCP later today.  Past Medical History:  Diagnosis Date  . Coronary atherosclerosis of native coronary artery    a. Anterior STEMI 2009 s/p BMSx2 to mid & prox LAD and angioplasty to distal LAD. total RCA with collaterals, moderate Cx plaquing. a. EF 55-60% in 2013.  [name] Elevated  troponin   . Gross hematuria   . Gross hematuria 11/18/2008   Qualifier: Diagnosis of  By: Owens Shark, RN, BSN, [name]    . Hemoptysis 05/16/2016  . HTN (hypertension)   . Myocardial infarct ([place]) 03/21/2008  . NSVT (nonsustained ventricular tachycardia) ([place])    a. Per DC summary from time of STEMI 2009.  [name] PAF (paroxysmal atrial fibrillation) ([place])   . Paroxysmal atrial flutter ([place])    a. Abnl EKG 01/2012 concerning for atrial flutter, event monitor showed sinus bradycardia only and no pauses. Not felt to be a candidate for longterm anticoag due to hematuria.  . Phlebitis and thrombophlebitis of superficial vessels of lower extremities   . Pure hypercholesterolemia    a. Has not tolerated statins in the past.  . Right leg DVT ([place])    a. Dx 01/2014.  [name] Sinus bradycardia    a. By prior event monitor.  . Sinus bradycardia    a. By prior event monitor.   . Thrombocytopenia ([place])   . Urinary retention 06/20/2011    Past Surgical History:  Procedure Laterality Date  . CARPAL TUNNEL RELEASE    . CORONARY STENT INTERVENTION N/A 03/06/2018   Procedure: CORONARY STENT INTERVENTION;  Surgeon: Lorretta Harp, MD;  Location: [facility];  Service: Cardiovascular;  Laterality: N/A;  . CORONARY STENT PLACEMENT  2009  . ESOPHAGOGASTRODUODENOSCOPY (EGD) WITH PROPOFOL N/A 03/19/2018   Procedure: ESOPHAGOGASTRODUODENOSCOPY (EGD) WITH PROPOFOL;  Surgeon: Wonda Horner, MD;  [facility]  ENDOSCOPY;  Service: Endoscopy;  Laterality: N/A;  . LEFT HEART CATH AND CORONARY ANGIOGRAPHY N/A 03/06/2018   Procedure: LEFT HEART CATH AND CORONARY ANGIOGRAPHY;  Surgeon: Lorretta Harp, MD;  Location: [facility];  Service: Cardiovascular;  Laterality: N/A;  . REPLACEMENT TOTAL KNEE BILATERAL     Left 2014 (Dr. Eddie Dibbles); right 2012 (Dr. Redmond Pulling)  . trigger finger surgery      Current Medications: Prior to Admission medications   Medication Sig Start Date End Date Taking? Authorizing Provider    clopidogrel (PLAVIX) 75 MG tablet Take 1 tablet (75 mg total) by mouth daily with breakfast. 03/08/18   Reino Bellis B, NP  famotidine (PEPCID) 20 MG tablet Take 1 tablet (20 mg total) by mouth 2 (two) times daily. 03/22/18   Shirley, [place], DO  fluticasone (FLONASE) 50 MCG/ACT nasal spray Place 2 sprays into both nostrils daily as needed for allergies or rhinitis.    [provider]  HYDROcodone-acetaminophen (NORCO/VICODIN) 5-325 MG tablet Take 1 tablet by mouth every 4 (four) hours as needed. Patient taking differently: Take 1 tablet by mouth every 4 (four) hours as needed for moderate pain.  12/27/17   Charlann Lange, PA-C  loratadine (CLARITIN) 10 MG tablet Take 10 mg by mouth daily.    [provider]  losartan (COZAAR) 25 MG tablet Take 0.5 tablets (12.5 mg total) by mouth daily. 03/17/18   Lyda Jester M, PA-C  metoprolol tartrate (LOPRESSOR) 25 MG tablet TAKE 1/2 TABLET BY MOUTH 2 TIMES DAILY Patient taking differently: Take 12.5 mg by mouth 2 (two) times daily with a meal.  05/17/17   Burnell Blanks, MD  pantoprazole (PROTONIX) 40 MG tablet Take 1 tablet (40 mg total) by mouth 2 (two) times daily. 03/22/18   Shirley, [place], DO  polyethylene glycol [facility] / GLYCOLAX) packet Take 17 g by mouth daily as needed for mild constipation, moderate constipation or severe constipation. 03/22/18   Shirley, [place], DO  pravastatin (PRAVACHOL) 20 MG tablet TAKE 1 TABLET (20 MG TOTAL) BY MOUTH DAILY AT 6 PM. Patient taking differently: Take 20 mg by mouth daily after supper.  12/20/17   Leeanne Rio, MD  pregabalin (LYRICA) 75 MG capsule Take 1 capsule (75 mg total) by mouth 2 (two) times daily. 05/18/16   Asencion Partridge, MD  senna (SENOKOT) 8.6 MG TABS tablet Take 1 tablet (8.6 mg total) by mouth daily as needed for mild constipation. 03/22/18   Shirley, [place], DO    Allergies:   Prednisone and Rosuvastatin   Social History   Socioeconomic History  . Marital  status: Married    Spouse name: Not on file  . Number of children: Not on file  . Years of education: Not on file  . Highest education level: Not on file  Occupational History  . Not on file  Social Needs  . Financial resource strain: Not on file  . Food insecurity:    Worry: Not on file    Inability: Not on file  . Transportation needs:    Medical: Not on file    Non-medical: Not on file  Tobacco Use  . Smoking status: Never Smoker  . Smokeless tobacco: Never Used  Substance and Sexual Activity  . Alcohol use: No  . Drug use: No  . Sexual activity: Not on file  Lifestyle  . Physical activity:    Days per week: Not on file    Minutes per session: Not on file  . Stress: Not  on file  Relationships  . Social connections:    Talks on phone: Not on file    Gets together: Not on file    Attends religious service: Not on file    Active member of club or organization: Not on file    Attends meetings of clubs or organizations: Not on file    Relationship status: Not on file  Other Topics Concern  . Not on file  Social History Narrative   Lives in [place]. Wife has died. Lives next door to his daughter [name]. Also has a very supportive daughter, [name] who lives out of town. 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 Denies tobacco, alcohol, or drug use ever in lifetime.     Family History:  The patient's family history includes Cancer in his sister and sister.   ROS:   Please see the history of present illness.    ROS All other systems reviewed and are negative.   PHYSICAL EXAM:   VS:  BP 122/62   Pulse 80   Ht 5\' 10"  (1.778 m)   Wt 175 lb (79.4 kg)   BMI 25.11 kg/m    GEN: Thin frail male n no acute distress  HEENT: normal  Neck: no JVD, carotid bruits, or masses Cardiac: Irregularly irregular; no murmurs, rubs, or gallops, trace lower extremity edema  Respiratory:  clear to auscultation bilaterally, normal work of breathing GI: soft, nontender,  nondistended, + BS MS: no deformity or atrophy  Skin: warm and dry, no rash Neuro:  Alert and Oriented x 3, Strength and sensation are intact Psych: euthymic mood, full affect  Wt Readings from Last 3 Encounters:  03/28/18 175 lb 3.2 oz (79.5 kg)  03/28/18 175 lb (79.4 kg)  03/19/18 166 lb (75.3 kg)      Studies/Labs Reviewed:   EKG:  EKG is ordered today.  The ekg ordered today demonstrates atrial fibrillation at rate of 80 bpm  Recent Labs: 08/25/2017: NT-Pro BNP 2,120 03/03/2018: B Natriuretic Peptide 526.6 03/17/2018: ALT 32 03/20/2018: BUN 42; Creatinine, Ser 1.30; Potassium 4.1; Sodium 144 03/22/2018: Hemoglobin 9.4; Platelets 75   Lipid Panel    Component Value Date/Time   CHOL 104 03/04/2018 0048   CHOL 117 08/09/2017 0840   TRIG 22 03/04/2018 0048   HDL 33 (L) 03/04/2018 0048   HDL 41 08/09/2017 0840   CHOLHDL 3.2 03/04/2018 0048   VLDL 4 03/04/2018 0048   LDLCALC 67 03/04/2018 0048   LDLCALC 65 08/09/2017 0840    Additional studies/ records that were reviewed today include:   As above   ASSESSMENT & PLAN:    1. Persistent atrial fibrillation -Rate is stable.  No recurrent bleeding.  He will call his GI doctor today to find out when safe to restart Xarelto.  He also has appointment with PCP later today.  2.  Hypotension -Blood pressure stable here however low at home.  Will continue metoprolol at low-dose.  Discontinue losartan.  Recheck in a few weeks.  3.  CAD status post recent stent -No angina.  Continue Plavix and statin.  4.  Chronic systolic heart failure -Intermittent lower extremity edema with orthopnea.  Mild volume overload by exam.  Increase Lasix for few days as below.  5.  Recent GI bleed Denies recurrent bleeding.  Has appointment with PCP later today.  He will check his CBC management at that time.  He will call his GI doc regarding Xarelto.   Medication Adjustments/Labs and Tests Ordered:  Current medicines are reviewed at length with  the patient today.  Concerns regarding medicines are outlined above.  Medication changes, Labs and Tests ordered today are listed in the Patient Instructions below. Patient Instructions  Medication Instructions:  Your physician has recommended you make the following change in your medication:  1. STOP LOSARTAN.  2. CONTINUE TAKING METOPROLOL.  3. START LASIX 20 MG TWICE DAILY FOR 2 DAYS THEN TAKE 20 MG ONCE DAILY.   Labwork: None ordered  Testing/Procedures: None ordered  Follow-Up: Your physician wants you to follow-up in: 4-6 [place] DR. Angelena Form.  Any Other Special Instructions Will Be Listed Below (If Applicable).  PLEASE CONTINUING TAKING XARELTO AND ASK GI DOCTOR WHAT DATE IT SHOULD BE CONTINUED.   If you need a refill on your cardiac medications before your next appointment, please call your pharmacy.      Jarrett Soho, [place]  03/28/2018 1:28 PM    [facility] [place], [place], [place]  [address] Phone: [phone]; Fax: [phone]

## 2018-03-28 NOTE — Progress Notes (Signed)
Subjective:    [name] is a 84 y.o. male who presents to [facility] today for hospital FU:  1.  Hospital FU:  Patient recently admitted for GI bleed.  He was previously on aspirin, Plavix, Xarelto following a NSTEMI at the end of August.  See hospital course for full details.  He was discharged home just on Plavix.  There is recommendation to restart his Xarelto and to have him follow-up to see gastroenterology.  He is not yet seen them.  He did see his cardiologist earlier today.  Since being discharged from the hospital he has had some episodes of confusion last week but these have completely resolved since the weekend.  His blood pressure has been good.  He has been taking his medications as prescribed.  He has had a daily bowel movement with no melena or hematochezia.  At the cardiology appointment today he was instructed to increase his furosemide to 40 mg for 2 days and then drop back to 20 mg.  He has noticed increased leg swelling over the past several days.  His daughter called here on Friday due to the leg swelling and we stopped restarted his home furosemide at that point.  It has been held at discharge.  He has had no chest pain.  No shortness of breath.  No palpitations.    ROS as above per HPI.     The following portions of the patient's history were reviewed and updated as appropriate: allergies, current medications, past medical history, family and social history, and problem list. Patient is a nonsmoker.    PMH reviewed.  Past Medical History:  Diagnosis Date  . Coronary atherosclerosis of native coronary artery    a. Anterior STEMI 2009 s/p BMSx2 to mid & prox LAD and angioplasty to distal LAD. total RCA with collaterals, moderate Cx plaquing. a. EF 55-60% in 2013.  [name] Elevated troponin   . Gross hematuria   . Gross hematuria 11/18/2008   Qualifier: Diagnosis of  By: Owens Shark, RN, BSN, [name]    . Hemoptysis 05/16/2016  . HTN (hypertension)   . Myocardial infarct  ([place]) 03/21/2008  . NSVT (nonsustained ventricular tachycardia) ([place])    a. Per DC summary from time of STEMI 2009.  [name] PAF (paroxysmal atrial fibrillation) ([place])   . Paroxysmal atrial flutter ([place])    a. Abnl EKG 01/2012 concerning for atrial flutter, event monitor showed sinus bradycardia only and no pauses. Not felt to be a candidate for longterm anticoag due to hematuria.  . Phlebitis and thrombophlebitis of superficial vessels of lower extremities   . Pure hypercholesterolemia    a. Has not tolerated statins in the past.  . Right leg DVT ([place])    a. Dx 01/2014.  [name] Sinus bradycardia    a. By prior event monitor.  . Sinus bradycardia    a. By prior event monitor.   . Thrombocytopenia ([place])   . Urinary retention 06/20/2011   Past Surgical History:  Procedure Laterality Date  . CARPAL TUNNEL RELEASE    . CORONARY STENT INTERVENTION N/A 03/06/2018   Procedure: CORONARY STENT INTERVENTION;  Surgeon: Lorretta Harp, MD;  Location: [facility];  Service: Cardiovascular;  Laterality: N/A;  . CORONARY STENT PLACEMENT  2009  . ESOPHAGOGASTRODUODENOSCOPY (EGD) WITH PROPOFOL N/A 03/19/2018   Procedure: ESOPHAGOGASTRODUODENOSCOPY (EGD) WITH PROPOFOL;  Surgeon: Wonda Horner, MD;  Location: [facility] ENDOSCOPY;  Service: Endoscopy;  Laterality: N/A;  . LEFT HEART CATH AND CORONARY ANGIOGRAPHY N/A 03/06/2018  Procedure: LEFT HEART CATH AND CORONARY ANGIOGRAPHY;  Surgeon: Lorretta Harp, MD;  Location: [facility];  Service: Cardiovascular;  Laterality: N/A;  . REPLACEMENT TOTAL KNEE BILATERAL     Left 2014 (Dr. Eddie Dibbles); right 2012 (Dr. Redmond Pulling)  . trigger finger surgery      Medications reviewed. Current Outpatient Medications  Medication Sig Dispense Refill  . clopidogrel (PLAVIX) 75 MG tablet Take 1 tablet (75 mg total) by mouth daily with breakfast. 90 tablet 1  . famotidine (PEPCID) 20 MG tablet Take 1 tablet (20 mg total) by mouth 2 (two) times daily. 60 tablet 0  . fluticasone  (FLONASE) 50 MCG/ACT nasal spray Place 2 sprays into both nostrils daily as needed for allergies or rhinitis.    . furosemide (LASIX) 20 MG tablet TAKE 20 MG BY MOUTH TWICE DAILY FOR 2 DAYS, THEN TAKE 20 MG ONCE DAILY. 90 tablet 3  . HYDROcodone-acetaminophen (NORCO/VICODIN) 5-325 MG tablet Take 1 tablet by mouth every 4 (four) hours as needed. 12 tablet 0  . loratadine (CLARITIN) 10 MG tablet Take 10 mg by mouth daily.    [name] losartan (COZAAR) 25 MG tablet Take 0.5 tablets (12.5 mg total) by mouth daily. 45 tablet 3  . metoprolol tartrate (LOPRESSOR) 25 MG tablet TAKE 1/2 TABLET BY MOUTH 2 TIMES DAILY 30 tablet 11  . pantoprazole (PROTONIX) 40 MG tablet Take 1 tablet (40 mg total) by mouth 2 (two) times daily. 60 tablet 0  . polyethylene glycol (MIRALAX / GLYCOLAX) packet Take 17 g by mouth daily as needed for mild constipation, moderate constipation or severe constipation. 14 each 0  . pravastatin (PRAVACHOL) 20 MG tablet TAKE 1 TABLET (20 MG TOTAL) BY MOUTH DAILY AT 6 PM. 90 tablet 1  . pregabalin (LYRICA) 75 MG capsule Take 1 capsule (75 mg total) by mouth 2 (two) times daily. 60 capsule 0  . senna (SENOKOT) 8.6 MG TABS tablet Take 1 tablet (8.6 mg total) by mouth daily as needed for mild constipation. 30 each 0   No current facility-administered medications for this visit.      Objective:   Physical Exam BP 106/62   Pulse 87   Temp 98 F (36.7 C) (Oral)   Ht 5\' 10"  (1.778 m)   Wt 175 lb 3.2 oz (79.5 kg)   SpO2 97%   BMI 25.14 kg/m  Gen:  Alert, cooperative patient who appears stated age in no acute distress.  Vital signs reviewed. HEENT: EOMI,  MMM Cardiac: Irregularly irregular rhythm.  Not tachycardic. Pulm:  Clear to auscultation bilaterally with good air movement.  No wheezes or rales noted.   Abd:  Soft/nondistended/nontender.   Exts: Bilateral lower extremity edema.  3+ edema left leg to mid shin.  2+ right ankles on the right. Skin: Few scattered senile purpura but no  actual bruising noted. Neuro: Fully alert and oriented.  Conversant and pleasant.  No results found for this or any previous visit (from the past 72 hour(s)).

## 2018-03-28 NOTE — Assessment & Plan Note (Signed)
He was previously on Xarelto plus beta-blocker for his atrial fibrillation.  He is currently in atrial fibrillation but not tachycardic/not RVR.

## 2018-03-28 NOTE — Patient Instructions (Signed)
Medication Instructions:  Your physician has recommended you make the following change in your medication:  1. STOP LOSARTAN.  2. CONTINUE TAKING METOPROLOL.  3. START LASIX 20 MG TWICE DAILY FOR 2 DAYS THEN TAKE 20 MG ONCE DAILY.   Labwork: None ordered  Testing/Procedures: None ordered  Follow-Up: Your physician wants you to follow-up in: 4-6 [place] DR. Angelena Form.  Any Other Special Instructions Will Be Listed Below (If Applicable).  PLEASE CONTINUING TAKING XARELTO AND ASK GI DOCTOR WHAT DATE IT SHOULD BE CONTINUED.   If you need a refill on your cardiac medications before your next appointment, please call your pharmacy.

## 2018-03-28 NOTE — Patient Instructions (Signed)
It was good to see you again today.   Start taking Xarelto 2.5 mg twice a day.  If you see any black stool, blood, bad bruising, sudden drop in his blood pressure stop this and call us.  If his blood pressure stays down go to the emergency department.  I am going to refer you to a gastroenterologist today.  Make sure he stopped the losartan.  Take the Lasix 40 mg for the next 2 days.  If he continues to have swelling in his legs he can continue at 40 beyond that.  Rechecking some labs today.  Come back on Tuesday and will repeat these.  Otherwise everything looks good!

## 2018-03-29 ENCOUNTER — Encounter: Payer: Self-pay | Admitting: Family Medicine

## 2018-03-29 LAB — CBC
HEMATOCRIT: 31 % — AB (ref 37.5–51.0)
HEMOGLOBIN: 9.7 g/dL — AB (ref 13.0–17.7)
MCH: 28.3 pg (ref 26.6–33.0)
MCHC: 31.3 g/dL — ABNORMAL LOW (ref 31.5–35.7)
MCV: 90 fL (ref 79–97)
Platelets: 127 10*3/uL — ABNORMAL LOW (ref 150–450)
RBC: 3.43 x10E6/uL — AB (ref 4.14–5.80)
RDW: 14.3 % (ref 12.3–15.4)
WBC: 4.4 10*3/uL (ref 3.4–10.8)

## 2018-03-29 LAB — COMPREHENSIVE METABOLIC PANEL
ALK PHOS: 93 IU/L (ref 39–117)
ALT: 19 IU/L (ref 0–44)
AST: 18 IU/L (ref 0–40)
Albumin/Globulin Ratio: 2 (ref 1.2–2.2)
Albumin: 4 g/dL (ref 3.2–4.6)
BILIRUBIN TOTAL: 1 mg/dL (ref 0.0–1.2)
BUN/Creatinine Ratio: 17 (ref 10–24)
BUN: 29 mg/dL (ref 10–36)
CHLORIDE: 108 mmol/L — AB (ref 96–106)
CO2: 23 mmol/L (ref 20–29)
CREATININE: 1.69 mg/dL — AB (ref 0.76–1.27)
Calcium: 8.9 mg/dL (ref 8.6–10.2)
GFR calc Af Amer: 40 mL/min/{1.73_m2} — ABNORMAL LOW (ref >59)
GFR calc non Af Amer: 35 mL/min/{1.73_m2} — ABNORMAL LOW (ref >59)
Globulin, Total: 2 g/dL (ref 1.5–4.5)
Glucose: 162 mg/dL — ABNORMAL HIGH (ref 65–99)
Potassium: 4 mmol/L (ref 3.5–5.2)
Sodium: 146 mmol/L — ABNORMAL HIGH (ref 134–144)
TOTAL PROTEIN: 6 g/dL (ref 6.0–8.5)

## 2018-03-29 NOTE — Assessment & Plan Note (Signed)
On plavix due to stent placement.  Tricky situation, as also with a fib and on coumadin.  Ultimately, risk of CVA with coumadin may be less than risk of GI bleed, especially since he's already had a bleed (albeit at that time, he was on ASA/Plavix/Warfarin.).   - Recommended to start 2.5 mg Xarelto in hospital on DC.  Both for CAD and CVA ppx.  This is a low dose for CVA ppx with a fib.   - again, await input from GI regarding risk of increasing Xarelto to full dose CVA ppx versus completely stopping xarelto.

## 2018-03-29 NOTE — Assessment & Plan Note (Signed)
Intermittent.  Hopefully less common now that losartan has been DC'ed.

## 2018-03-29 NOTE — Assessment & Plan Note (Signed)
Reason for hospitalization.  No bleeding/melena since DC.  - See above for discussion regarding blood thinners.  Awaiting input from GI.  Referral supposedly placed in hospital -- family hasn't been contacted and I don't see anything.  Will place one today.

## 2018-03-30 ENCOUNTER — Telehealth: Payer: Self-pay | Admitting: Family Medicine

## 2018-03-30 NOTE — Telephone Encounter (Signed)
Called and spoke with patient's daughter regarding labs.  Discussed moderate elevation of SCr/drop in GFR.   This is most likely combination of relative hypotension plus use of losartan.  Patient's losartan has been stopped.  Recommendation is to come back tomorrow versus Monday to have serum creatinine checked.  Sister will call other sister who is patient's primary caregiver and have her schedule follow-up appointment.  Otherwise he is doing well.  All questions answered

## 2018-03-31 ENCOUNTER — Ambulatory Visit: Payer: Medicare Other | Admitting: Cardiology

## 2018-03-31 ENCOUNTER — Other Ambulatory Visit: Payer: Medicare Other

## 2018-03-31 DIAGNOSIS — K922 Gastrointestinal hemorrhage, unspecified: Secondary | ICD-10-CM

## 2018-04-01 LAB — COMPREHENSIVE METABOLIC PANEL
ALT: 15 IU/L (ref 0–44)
AST: 19 IU/L (ref 0–40)
Albumin/Globulin Ratio: 1.9 (ref 1.2–2.2)
Albumin: 3.9 g/dL (ref 3.2–4.6)
Alkaline Phosphatase: 86 IU/L (ref 39–117)
BILIRUBIN TOTAL: 0.9 mg/dL (ref 0.0–1.2)
BUN/Creatinine Ratio: 16 (ref 10–24)
BUN: 26 mg/dL (ref 10–36)
CHLORIDE: 104 mmol/L (ref 96–106)
CO2: 21 mmol/L (ref 20–29)
Calcium: 9.2 mg/dL (ref 8.6–10.2)
Creatinine, Ser: 1.64 mg/dL — ABNORMAL HIGH (ref 0.76–1.27)
GFR calc Af Amer: 42 mL/min/{1.73_m2} — ABNORMAL LOW (ref >59)
GFR calc non Af Amer: 36 mL/min/{1.73_m2} — ABNORMAL LOW (ref >59)
GLUCOSE: 123 mg/dL — AB (ref 65–99)
Globulin, Total: 2.1 g/dL (ref 1.5–4.5)
Potassium: 3.7 mmol/L (ref 3.5–5.2)
Sodium: 146 mmol/L — ABNORMAL HIGH (ref 134–144)
Total Protein: 6 g/dL (ref 6.0–8.5)

## 2018-04-01 LAB — CBC
HEMATOCRIT: 31.1 % — AB (ref 37.5–51.0)
HEMOGLOBIN: 9.9 g/dL — AB (ref 13.0–17.7)
MCH: 28.3 pg (ref 26.6–33.0)
MCHC: 31.8 g/dL (ref 31.5–35.7)
MCV: 89 fL (ref 79–97)
Platelets: 116 10*3/uL — ABNORMAL LOW (ref 150–450)
RBC: 3.5 x10E6/uL — ABNORMAL LOW (ref 4.14–5.80)
RDW: 13.8 % (ref 12.3–15.4)
WBC: 4.1 10*3/uL (ref 3.4–10.8)

## 2018-04-04 ENCOUNTER — Other Ambulatory Visit: Payer: Medicare Other

## 2018-04-06 ENCOUNTER — Other Ambulatory Visit: Payer: Self-pay | Admitting: Family Medicine

## 2018-04-06 ENCOUNTER — Telehealth: Payer: Self-pay

## 2018-04-06 ENCOUNTER — Telehealth: Payer: Self-pay | Admitting: Cardiovascular Disease

## 2018-04-06 DIAGNOSIS — N183 Chronic kidney disease, stage 3 unspecified: Secondary | ICD-10-CM

## 2018-04-06 NOTE — Telephone Encounter (Signed)
New message   Pt c/o medication issue:  1. Name of Medication: robaxin  2. How are you currently taking this medication (dosage and times per day)?take as needed   3. Are you having a reaction (difficulty breathing--STAT)?no   4. What is your medication issue?patient's daughter states that he has a crick in his neck. Patient's daughter wants to know if it ok to take this medication?

## 2018-04-06 NOTE — Telephone Encounter (Signed)
I reviewed with Tana Coast, PharmD and OK for pt to take this.  I spoke with pt's daughter and made her aware.

## 2018-04-06 NOTE — Telephone Encounter (Signed)
Called patient's daughter [name].  Reviewed most recent lab results.  Creatinine has been stable.  Since 20 September she has been doing 40 mg of Lasix each day.  Her father's weight has fluctuated between 171 pounds to 173 pounds.  She reports his dyspnea is doing well.  She reports his lower extremity edema is markedly improved.  Per Dr. Cindra Presume note he was to increase Lasix to 40 mg for 3 days and then decreased back to 20.  Recommended decreasing to 20 starting today.  Repeat BMP tomorrow.

## 2018-04-06 NOTE — Telephone Encounter (Signed)
Patient daughter, [name], called and left message. Has not heard from latest lab results on 9/20 and also wants to ask question regarding Lasix.  Call back is [phone]  Danley Danker, RN [facility] [place])

## 2018-04-07 ENCOUNTER — Emergency Department (HOSPITAL_COMMUNITY): Payer: Medicare Other

## 2018-04-07 ENCOUNTER — Emergency Department (HOSPITAL_COMMUNITY)
Admission: EM | Admit: 2018-04-07 | Discharge: 2018-04-07 | Disposition: A | Discharge status: Home / Self Care | Payer: Medicare Other | Attending: Emergency Medicine | Admitting: Emergency Medicine

## 2018-04-07 ENCOUNTER — Other Ambulatory Visit: Payer: Self-pay

## 2018-04-07 ENCOUNTER — Encounter (HOSPITAL_COMMUNITY): Payer: Self-pay | Admitting: *Deleted

## 2018-04-07 ENCOUNTER — Other Ambulatory Visit: Payer: Medicare Other

## 2018-04-07 DIAGNOSIS — Z79899 Other long term (current) drug therapy: Secondary | ICD-10-CM | POA: Insufficient documentation

## 2018-04-07 DIAGNOSIS — Y929 Unspecified place or not applicable: Secondary | ICD-10-CM | POA: Insufficient documentation

## 2018-04-07 DIAGNOSIS — I110 Hypertensive heart disease with heart failure: Secondary | ICD-10-CM | POA: Diagnosis not present

## 2018-04-07 DIAGNOSIS — X58XXXA Exposure to other specified factors, initial encounter: Secondary | ICD-10-CM | POA: Insufficient documentation

## 2018-04-07 DIAGNOSIS — Y999 Unspecified external cause status: Secondary | ICD-10-CM | POA: Diagnosis not present

## 2018-04-07 DIAGNOSIS — Y939 Activity, unspecified: Secondary | ICD-10-CM | POA: Insufficient documentation

## 2018-04-07 DIAGNOSIS — I5043 Acute on chronic combined systolic (congestive) and diastolic (congestive) heart failure: Secondary | ICD-10-CM | POA: Diagnosis not present

## 2018-04-07 DIAGNOSIS — S161XXA Strain of muscle, fascia and tendon at neck level, initial encounter: Secondary | ICD-10-CM | POA: Diagnosis not present

## 2018-04-07 LAB — I-STAT TROPONIN, ED
TROPONIN I, POC: 0.06 ng/mL (ref 0.00–0.08)
Troponin i, poc: 0.05 ng/mL (ref 0.00–0.08)

## 2018-04-07 LAB — CBC
HEMATOCRIT: 32.7 % — AB (ref 39.0–52.0)
HEMOGLOBIN: 10.1 g/dL — AB (ref 13.0–17.0)
MCH: 28.4 pg (ref 26.0–34.0)
MCHC: 30.9 g/dL (ref 30.0–36.0)
MCV: 91.9 fL (ref 78.0–100.0)
Platelets: 98 10*3/uL — ABNORMAL LOW (ref 150–400)
RBC: 3.56 MIL/uL — ABNORMAL LOW (ref 4.22–5.81)
RDW: 14.6 % (ref 11.5–15.5)
WBC: 6.1 10*3/uL (ref 4.0–10.5)

## 2018-04-07 LAB — BASIC METABOLIC PANEL
ANION GAP: 8 (ref 5–15)
BUN: 22 mg/dL (ref 8–23)
CALCIUM: 8.7 mg/dL — AB (ref 8.9–10.3)
CHLORIDE: 103 mmol/L (ref 98–111)
CO2: 28 mmol/L (ref 22–32)
Creatinine, Ser: 1.63 mg/dL — ABNORMAL HIGH (ref 0.61–1.24)
GFR calc non Af Amer: 35 mL/min — ABNORMAL LOW (ref >60)
GFR, EST AFRICAN AMERICAN: 41 mL/min — AB (ref >60)
Glucose, Bld: 141 mg/dL — ABNORMAL HIGH (ref 70–99)
Potassium: 3.3 mmol/L — ABNORMAL LOW (ref 3.5–5.1)
SODIUM: 139 mmol/L (ref 135–145)

## 2018-04-07 LAB — SEDIMENTATION RATE: SED RATE: 27 mm/h — AB (ref 0–16)

## 2018-04-07 MED ORDER — POTASSIUM CHLORIDE CRYS ER 20 MEQ PO TBCR
20.0000 meq | EXTENDED_RELEASE_TABLET | Freq: Once | ORAL | Status: AC
  Administered 2018-04-07: 20 meq via ORAL
  Filled 2018-04-07: qty 1

## 2018-04-07 MED ORDER — OXYCODONE-ACETAMINOPHEN 5-325 MG PO TABS
1.0000 | ORAL_TABLET | Freq: Once | ORAL | Status: AC
  Administered 2018-04-07: 1 via ORAL
  Filled 2018-04-07: qty 1

## 2018-04-07 MED ORDER — POTASSIUM CHLORIDE ER 20 MEQ PO TBCR: 20.0000 meq | EXTENDED_RELEASE_TABLET | Freq: Every day | ORAL | 0 refills | Status: DC

## 2018-04-07 NOTE — ED Triage Notes (Signed)
Pt in c/o neck pain, pain started in his left shoulder and now he has pain to both sides of his neck, pt was admitted to the hospital earlier in the month for stent placement

## 2018-04-07 NOTE — ED Triage Notes (Signed)
Pain is worse with movement, when patient points to area of pain he points to anterior neck under his jaw line

## 2018-04-07 NOTE — Discharge Instructions (Signed)
The blood work, chest xray and EKG were reassuring.   Take the muscle relaxer for pain. He can also have tylenol for pain. No NSAIDs like aleve, motrin.   Start taking potassium 56mEq daily while he is taking the lasix. I sent this to the pharmacy. Let his regular doctor know about it for recheck.   Come back to the ER for any new or worsening symptoms like chest pain, problems breathing, vision problems.

## 2018-04-07 NOTE — ED Provider Notes (Signed)
[facility] EMERGENCY DEPARTMENT Provider Note   CSN: [id number] Arrival date & time: 04/07/18  7893     History   Chief Complaint Chief Complaint  Patient presents with  . Neck Pain    HPI [name] is a 84 y.o. male.  HPI   [name] is a 84yo male with a history of NSTEMI (DES placed 02/2018), HTN, HLD, Afib, CHF (EF 30-35%), GI bleed, DDD presents to the emergency department for evaluation of left sided jaw neck pain. Per patient's son at bedside, he was complaining of left shoulder pain two days ago. Shortly after he reported that he was having left anterior neck/jaw pain. Patient states pain is worsened with neck movement. This morning, while he was eating breakfast pain became acutely worse.  Now opening his mouth aggravates his symptoms.  They tried calling his PCP yesterday and gave him a muscle relaxer and some Tylenol which he states helped somewhat.  Patient denies chest pain, shortness of breath, nausea/vomiting, diaphoresis, lightheadedness, dizziness, numbness, weakness, headache, vision loss, fever/chills, back pain, posterior neck pain.   Past Medical History:  Diagnosis Date  . Coronary atherosclerosis of native coronary artery    a. Anterior STEMI 2009 s/p BMSx2 to mid & prox LAD and angioplasty to distal LAD. total RCA with collaterals, moderate Cx plaquing. a. EF 55-60% in 2013.  [name] Elevated troponin   . Gross hematuria   . Gross hematuria 11/18/2008   Qualifier: Diagnosis of  By: Owens Shark, RN, BSN, [name]    . Hemoptysis 05/16/2016  . HTN (hypertension)   . Myocardial infarct ([place]) 03/21/2008  . NSVT (nonsustained ventricular tachycardia) ([place])    a. Per DC summary from time of STEMI 2009.  [name] PAF (paroxysmal atrial fibrillation) ([place])   . Paroxysmal atrial flutter ([place])    a. Abnl EKG 01/2012 concerning for atrial flutter, event monitor showed sinus bradycardia only and no pauses. Not felt to be a candidate for longterm anticoag  due to hematuria.  . Phlebitis and thrombophlebitis of superficial vessels of lower extremities   . Pure hypercholesterolemia    a. Has not tolerated statins in the past.  . Right leg DVT ([place])    a. Dx 01/2014.  [name] Sinus bradycardia    a. By prior event monitor.  . Sinus bradycardia    a. By prior event monitor.   . Thrombocytopenia ([place])   . Urinary retention 06/20/2011    Patient Active Problem List   Diagnosis Date Noted  . Acute superficial gastritis   . Acute blood loss anemia   . GI bleed 03/17/2018  . Acute GI bleeding   . Persistent atrial fibrillation ([place])   . Leukocytosis   . Toe pain, bilateral 11/30/2017  . Toenail fungus 11/30/2017  . Sinus congestion 09/13/2017  . Cough 04/25/2017  . Orthostatic hypotension 11/26/2016  . Laceration of right lower extremity 11/26/2016  . Laceration of right forearm 11/01/2016  . Health care maintenance 09/17/2016  . PAD (peripheral artery disease) ([place]) 08/13/2016  . Cerebrovascular accident (CVA) ([place]) 08/09/2016  . Cold extremity without peripheral vascular disease   . Abnormal CT of the chest 07/21/2016  . Acute on chronic combined systolic and diastolic congestive heart failure ([place]) 06/17/2016  . Tinea pedis 05/27/2016  . NSTEMI (non-ST elevated myocardial infarction) ([place]) 05/16/2016  . Chronic atrial fibrillation ([place]) 05/16/2016  . Chronic ITP (idiopathic thrombocytopenia) ([facility])   . CHF (congestive heart failure), NYHA class II, chronic, diastolic ([place])   .  CAP (community acquired pneumonia) 05/15/2016  . Nail dystrophy 09/01/2015  . Microscopic hematuria 09/28/2014  . Gait instability 09/27/2014  . Coronary atherosclerosis of native coronary artery   . Paroxysmal atrial flutter ([place])   . Pure hypercholesterolemia   . HTN (hypertension)   . Hyperlipidemia 04/23/2013  . Atrial arrhythmia 04/23/2013  . Left carpal tunnel syndrome 04/06/2013  . Left hand pain 04/06/2013  . Numbness and tingling in left hand  04/06/2013  . Benign prostate hyperplasia 10/04/2011  . Bladder neck obstruction 10/04/2011  . Pain in joint of right shoulder 09/29/2011  . Impingement syndrome of shoulder region 07/05/2011  . Levator scapula syndrome 07/05/2011  . Enlarged prostate with lower urinary tract symptoms (LUTS) 06/09/2011  . Hematuria, unspecified 06/09/2011  . Congestive heart failure with left ventricular diastolic dysfunction ([place]) 05/25/2011  . Coronary artery disease involving coronary bypass graft with angina pectoris ([place]) 05/25/2011  . Degenerative disk disease 12/20/2010    Past Surgical History:  Procedure Laterality Date  . CARPAL TUNNEL RELEASE    . CORONARY STENT INTERVENTION N/A 03/06/2018   Procedure: CORONARY STENT INTERVENTION;  Surgeon: Lorretta Harp, MD;  Location: [facility];  Service: Cardiovascular;  Laterality: N/A;  . CORONARY STENT PLACEMENT  2009  . ESOPHAGOGASTRODUODENOSCOPY (EGD) WITH PROPOFOL N/A 03/19/2018   Procedure: ESOPHAGOGASTRODUODENOSCOPY (EGD) WITH PROPOFOL;  Surgeon: Wonda Horner, MD;  Location[facility] ENDOSCOPY;  Service: Endoscopy;  Laterality: N/A;  . LEFT HEART CATH AND CORONARY ANGIOGRAPHY N/A 03/06/2018   Procedure: LEFT HEART CATH AND CORONARY ANGIOGRAPHY;  Surgeon: Lorretta Harp, MD;  Location: [facility];  Service: Cardiovascular;  Laterality: N/A;  . REPLACEMENT TOTAL KNEE BILATERAL     Left 2014 (Dr. Eddie Dibbles); right 2012 (Dr. Redmond Pulling)  . trigger finger surgery          Home Medications    Prior to Admission medications   Medication Sig Start Date End Date Taking? Authorizing Provider  clopidogrel (PLAVIX) 75 MG tablet Take 1 tablet (75 mg total) by mouth daily with breakfast. 03/08/18   Reino Bellis B, NP  famotidine (PEPCID) 20 MG tablet Take 1 tablet (20 mg total) by mouth 2 (two) times daily. 03/22/18   Shirley, [place], DO  fluticasone (FLONASE) 50 MCG/ACT nasal spray Place 2 sprays into both nostrils daily as needed for allergies  or rhinitis.    [provider]  furosemide (LASIX) 20 MG tablet TAKE 20 MG BY MOUTH TWICE DAILY FOR 2 DAYS, THEN TAKE 20 MG ONCE DAILY. 03/28/18   Bhagat, Crista Luria, PA  HYDROcodone-acetaminophen (NORCO/VICODIN) 5-325 MG tablet Take 1 tablet by mouth every 4 (four) hours as needed. 12/27/17   Charlann Lange, PA-C  loratadine (CLARITIN) 10 MG tablet Take 10 mg by mouth daily.    [provider]  losartan (COZAAR) 25 MG tablet Take 0.5 tablets (12.5 mg total) by mouth daily. 03/17/18   Lyda Jester M, PA-C  metoprolol tartrate (LOPRESSOR) 25 MG tablet TAKE 1/2 TABLET BY MOUTH 2 TIMES DAILY 05/17/17   Burnell Blanks, MD  pantoprazole (PROTONIX) 40 MG tablet Take 1 tablet (40 mg total) by mouth 2 (two) times daily. 03/22/18   Shirley, [place], DO  polyethylene glycol [facility] / GLYCOLAX) packet Take 17 g by mouth daily as needed for mild constipation, moderate constipation or severe constipation. 03/22/18   Shirley, [place], DO  pravastatin (PRAVACHOL) 20 MG tablet TAKE 1 TABLET (20 MG TOTAL) BY MOUTH DAILY AT 6 PM. 12/20/17   Ardelia Mems [place]  J, MD  pregabalin (LYRICA) 75 MG capsule Take 1 capsule (75 mg total) by mouth 2 (two) times daily. 05/18/16   Asencion Partridge, MD  rivaroxaban (XARELTO) 2.5 MG TABS tablet Take 1 tablet (2.5 mg total) by mouth 2 (two) times daily. 03/28/18   [name], MD  senna (SENOKOT) 8.6 MG TABS tablet Take 1 tablet (8.6 mg total) by mouth daily as needed for mild constipation. 03/22/18   Shirley, [place], DO    Family History Family History  Problem Relation Age of Onset  . Cancer Sister   . Cancer Sister     Social History Social History   Tobacco Use  . Smoking status: Never Smoker  . Smokeless tobacco: Never Used  Substance Use Topics  . Alcohol use: No  . Drug use: No     Allergies   Prednisone and Rosuvastatin   Review of Systems Review of Systems  Constitutional: Negative for chills, diaphoresis and fever.  HENT:  Negative for congestion, ear pain and sore throat.        Jaw pain  Eyes: Negative for visual disturbance.  Respiratory: Negative for shortness of breath.   Cardiovascular: Negative for chest pain.  Gastrointestinal: Negative for abdominal pain, nausea and vomiting.  Genitourinary: Negative for difficulty urinating.  Musculoskeletal: Positive for neck pain. Negative for back pain.  Skin: Negative for rash.  Neurological: Negative for dizziness, weakness, light-headedness, numbness and headaches.     Physical Exam Updated Vital Signs BP 139/72 (BP Location: Right Arm)   Pulse 82   Temp 97.9 F (36.6 C) (Oral)   Resp 20   SpO2 94%   Physical Exam  Constitutional: He is oriented to person, place, and time. He appears well-developed and well-nourished. No distress.  NAD, non-toxic appearing.   HENT:  Head: Normocephalic and atraumatic.  Mouth/Throat: Oropharynx is clear and moist.  No tenderness over the mandible or TMJ. Bilateral TMs with good cone of light. No pain over temporal arteries. No temporal artery bruit.   Eyes: Pupils are equal, round, and reactive to light. Conjunctivae are normal. Right eye exhibits no discharge. Left eye exhibits no discharge.  Neck:  Limited neck ROM due to pain, particularly with neck extension and with left neck rotation. No cervical lymphadenopathy. No masses appreciated in the neck. Tender to palpation over superior trapezius muscle fibers bilaterally. No bruising or rash noted on the skin.   Cardiovascular: Normal rate, regular rhythm and intact distal pulses.  Pulmonary/Chest: Effort normal and breath sounds normal. No stridor. No respiratory distress. He has no wheezes. He has no rales.  Abdominal: Soft. Bowel sounds are normal. There is no tenderness.  Neurological: He is alert and oriented to person, place, and time. Coordination normal.  Strength 5/5 in bilateral UE and LE.   Skin: Skin is warm and dry. He is not diaphoretic.  Psychiatric:  He has a normal mood and affect. His behavior is normal.  Nursing note and vitals reviewed.  ED Treatments / Results  Labs (all labs ordered are listed, but only abnormal results are displayed) Labs Reviewed  CBC - Abnormal; Notable for the following components:      Result Value   RBC 3.56 (*)    Hemoglobin 10.1 (*)    HCT 32.7 (*)    Platelets 98 (*)    All other components within normal limits  BASIC METABOLIC PANEL - Abnormal; Notable for the following components:   Potassium 3.3 (*)    Glucose, Bld 141 (*)  Creatinine, Ser 1.63 (*)    Calcium 8.7 (*)    GFR calc non Af Amer 35 (*)    GFR calc Af Amer 41 (*)    All other components within normal limits  SEDIMENTATION RATE - Abnormal; Notable for the following components:   Sed Rate 27 (*)    All other components within normal limits  C-REACTIVE PROTEIN  I-STAT TROPONIN, ED  I-STAT TROPONIN, ED    EKG EKG Interpretation  Date/Time:  Friday April 07 2018 07:54:31 EDT Ventricular Rate:  86 PR Interval:    QRS Duration: 108 QT Interval:  392 QTC Calculation: 469 R Axis:   -92 Text Interpretation:  Atrial fibrillation Incomplete right bundle branch block Possible Right ventricular hypertrophy Possible Anterolateral infarct , age undetermined Abnormal ECG Confirmed by Quintella Reichert [phone]) on 04/07/2018 8:07:23 AM   Radiology Dg Chest 2 View  Result Date: 04/07/2018 CLINICAL DATA:  c/o neck pain x 2 days, pain started in his left shoulder and now he has pain to both sides of his neck, pt was admitted to the hospital earlier in the month for stent placement; previous 2 stents placed in 2009 EXAM: CHEST - 2 VIEW COMPARISON:  03/03/2018 FINDINGS: Mild enlargement of the cardiopericardial silhouette, stable. No mediastinal or hilar masses. No evidence of adenopathy. Lungs are hyperexpanded but clear. No pleural effusion or pneumothorax. Skeletal structures are demineralized but grossly intact. IMPRESSION: 1. No acute  cardiopulmonary disease. 2. Mild stable cardiomegaly. Electronically Signed   By: Lajean Manes M.D.   On: 04/07/2018 09:26    Procedures Procedures (including critical care time)  Medications Ordered in ED Medications  potassium chloride SA (K-DUR,KLOR-CON) CR tablet 20 mEq (has no administration in time range)  oxyCODONE-acetaminophen (PERCOCET/ROXICET) 5-325 MG per tablet 1 tablet (1 tablet Oral Given 04/07/18 6962)     Initial Impression / Assessment and Plan / ED Course  I have reviewed the triage vital signs and the nursing notes.  Pertinent labs & imaging results that were available during my care of the patient were reviewed by me and considered in my medical decision making (see chart for details).    84yo male presents with left-sided anterior neck pain and jaw pain with eating this morning. No CP, SOB, lightheadedness, diaphoresis, n/v. On exam, he is afebrile and VSS. Neck pain is worse with neck rotation and with palpation.   Given patient has left sided symptoms and just had an NSTEMI with DES placed, will get cardiac workup to evaluate for ACS. Also will get ESR/CRP to evaluate further for potential temporal arteritis given he is elderly and has described what sounds like jaw claudication.   Results reviewed. Delta troponin negative, EKG without ischemic changes and CXR unremarkable. Given this, doubt ACS. His sedimentation rate is elevated at 27, although this is appropriate for patient's age. I doubt temporal arteritis given ESR, no vision changes, no headache, no constitutional symptoms and no temporal artery pain or bruit on exam. I discussed this patient with Dr. Ralene Bathe.  His symptoms of neck pain worsened with movement is likely musculoskeletal.  I have counseled patient that he can continue taking his muscle relaxer.  Patient's potassium was marginally low (3.3), he recently started back on his diuretic.  Plan to start him on 20 mEq of potassium daily and to follow-up with  his PCP for recheck of his lab work.  I discussed strict return precautions with patient and his family member at bedside who agree and appear reliable.  This was a shared visit with Dr. Ralene Bathe who also saw the patient and agrees with plan to discharge home with close PCP follow-up.  Final Clinical Impressions(s) / ED Diagnoses   Final diagnoses:  Acute strain of neck muscle, initial encounter    ED Discharge Orders         Ordered    potassium chloride 20 MEQ TBCR  Daily,   Status:  Discontinued     04/07/18 1231    Potassium Chloride ER 20 MEQ TBCR  Daily     04/07/18 1233           Bernarda Caffey 04/12/18 1131    Quintella Reichert, MD 04/13/18 [id number]

## 2018-04-10 ENCOUNTER — Encounter (HOSPITAL_COMMUNITY): Payer: Self-pay | Admitting: Emergency Medicine

## 2018-04-10 ENCOUNTER — Other Ambulatory Visit: Payer: Self-pay

## 2018-04-10 ENCOUNTER — Emergency Department (HOSPITAL_COMMUNITY)
Admission: EM | Admit: 2018-04-10 | Discharge: 2018-04-11 | Disposition: A | Discharge status: Home / Self Care | Payer: Medicare Other | Attending: Emergency Medicine | Admitting: Emergency Medicine

## 2018-04-10 DIAGNOSIS — I5032 Chronic diastolic (congestive) heart failure: Secondary | ICD-10-CM | POA: Diagnosis not present

## 2018-04-10 DIAGNOSIS — I110 Hypertensive heart disease with heart failure: Secondary | ICD-10-CM | POA: Insufficient documentation

## 2018-04-10 DIAGNOSIS — Z79899 Other long term (current) drug therapy: Secondary | ICD-10-CM | POA: Insufficient documentation

## 2018-04-10 DIAGNOSIS — R339 Retention of urine, unspecified: Secondary | ICD-10-CM | POA: Diagnosis present

## 2018-04-10 LAB — URINALYSIS, ROUTINE W REFLEX MICROSCOPIC
BACTERIA UA: NONE SEEN
BILIRUBIN URINE: NEGATIVE
Glucose, UA: NEGATIVE mg/dL
KETONES UR: NEGATIVE mg/dL
LEUKOCYTES UA: NEGATIVE
NITRITE: NEGATIVE
Protein, ur: NEGATIVE mg/dL
Specific Gravity, Urine: 1.009 (ref 1.005–1.030)
pH: 7 (ref 5.0–8.0)

## 2018-04-10 LAB — CBC WITH DIFFERENTIAL/PLATELET
ABS IMMATURE GRANULOCYTES: 0 10*3/uL (ref 0.0–0.1)
Basophils Absolute: 0 10*3/uL (ref 0.0–0.1)
Basophils Relative: 0 %
EOS PCT: 1 %
Eosinophils Absolute: 0.1 10*3/uL (ref 0.0–0.7)
HEMATOCRIT: 32.9 % — AB (ref 39.0–52.0)
Hemoglobin: 10.2 g/dL — ABNORMAL LOW (ref 13.0–17.0)
IMMATURE GRANULOCYTES: 1 %
LYMPHS ABS: 0.8 10*3/uL (ref 0.7–4.0)
LYMPHS PCT: 12 %
MCH: 28.2 pg (ref 26.0–34.0)
MCHC: 31 g/dL (ref 30.0–36.0)
MCV: 90.9 fL (ref 78.0–100.0)
Monocytes Absolute: 0.8 10*3/uL (ref 0.1–1.0)
Monocytes Relative: 11 %
NEUTROS ABS: 5.3 10*3/uL (ref 1.7–7.7)
Neutrophils Relative %: 75 %
Platelets: 131 10*3/uL — ABNORMAL LOW (ref 150–400)
RBC: 3.62 MIL/uL — AB (ref 4.22–5.81)
RDW: 14 % (ref 11.5–15.5)
WBC: 7.1 10*3/uL (ref 4.0–10.5)

## 2018-04-10 LAB — COMPREHENSIVE METABOLIC PANEL
ALT: 14 U/L (ref 0–44)
AST: 18 U/L (ref 15–41)
Albumin: 3.5 g/dL (ref 3.5–5.0)
Alkaline Phosphatase: 80 U/L (ref 38–126)
Anion gap: 9 (ref 5–15)
BUN: 18 mg/dL (ref 8–23)
CHLORIDE: 101 mmol/L (ref 98–111)
CO2: 26 mmol/L (ref 22–32)
CREATININE: 1.44 mg/dL — AB (ref 0.61–1.24)
Calcium: 9.2 mg/dL (ref 8.9–10.3)
GFR calc Af Amer: 47 mL/min — ABNORMAL LOW (ref >60)
GFR calc non Af Amer: 41 mL/min — ABNORMAL LOW (ref >60)
Glucose, Bld: 113 mg/dL — ABNORMAL HIGH (ref 70–99)
Potassium: 4.1 mmol/L (ref 3.5–5.1)
Sodium: 136 mmol/L (ref 135–145)
Total Bilirubin: 1.8 mg/dL — ABNORMAL HIGH (ref 0.3–1.2)
Total Protein: 6.5 g/dL (ref 6.5–8.1)

## 2018-04-10 LAB — I-STAT CG4 LACTIC ACID, ED
LACTIC ACID, VENOUS: 1.28 mmol/L (ref 0.5–1.9)
LACTIC ACID, VENOUS: 1.4 mmol/L (ref 0.5–1.9)

## 2018-04-10 NOTE — ED Triage Notes (Signed)
Pt presents with frequent urination with foul odor; also states he has been incontinent which is unusual

## 2018-04-11 ENCOUNTER — Ambulatory Visit: Payer: Medicare Other

## 2018-04-11 NOTE — ED Provider Notes (Signed)
[facility]ERGENCY DEPARTMENT Provider Note   CSN: [id number] Arrival date & time: 04/10/18  1750     History   Chief Complaint Chief Complaint  Patient presents with  . Urinary Frequency    HPI [name] is a 84 y.o. male presenting with his daughter for urinary frequency.  Patient's daughter states that she became concerned because the patient urinated 4 times in a 2-hour period between noon and 2 PM.  Patient was able to make it to the bathroom on each of these occasions however had trouble controlling his urine, urine present in patient's underwear.  Patient's daughter states that she also feels that the patient's urine smelled strongly today and was concerned for infection.  Of note patient has started multiple new medications recently including restarting his Lasix last week and beginning Robaxin over the weekend.  Patient is alert and oriented x3 today.  Patient is aware of why he is here and how he got here, patient's daughter is staying with the patient due to recent loss of wife and the daughters do not like leaving him alone for too long due to recent medical events.  Patient states that he feels well and is in no pain today.  Patient denies dysuria, hematuria, abdominal pain, nausea/vomiting, chest pain, back pain, recent falls or trauma.  HPI  Past Medical History:  Diagnosis Date  . Coronary atherosclerosis of native coronary artery    a. Anterior STEMI 2009 s/p BMSx2 to mid & prox LAD and angioplasty to distal LAD. total RCA with collaterals, moderate Cx plaquing. a. EF 55-60% in 2013.  [name] Elevated troponin   . Gross hematuria   . Gross hematuria 11/18/2008   Qualifier: Diagnosis of  By: Owens Shark, RN, BSN, [name]    . Hemoptysis 05/16/2016  . HTN (hypertension)   . Myocardial infarct ([place]) 03/21/2008  . NSVT (nonsustained ventricular tachycardia) ([place])    a. Per DC summary from time of STEMI 2009.  [name] PAF (paroxysmal atrial fibrillation)  ([place])   . Paroxysmal atrial flutter ([place])    a. Abnl EKG 01/2012 concerning for atrial flutter, event monitor showed sinus bradycardia only and no pauses. Not felt to be a candidate for longterm anticoag due to hematuria.  . Phlebitis and thrombophlebitis of superficial vessels of lower extremities   . Pure hypercholesterolemia    a. Has not tolerated statins in the past.  . Right leg DVT ([place])    a. Dx 01/2014.  [name] Sinus bradycardia    a. By prior event monitor.  . Sinus bradycardia    a. By prior event monitor.   . Thrombocytopenia ([place])   . Urinary retention 06/20/2011    Patient Active Problem List   Diagnosis Date Noted  . Acute superficial gastritis   . Acute blood loss anemia   . GI bleed 03/17/2018  . Acute GI bleeding   . Persistent atrial fibrillation   . Leukocytosis   . Toe pain, bilateral 11/30/2017  . Toenail fungus 11/30/2017  . Sinus congestion 09/13/2017  . Cough 04/25/2017  . Orthostatic hypotension 11/26/2016  . Laceration of right lower extremity 11/26/2016  . Laceration of right forearm 11/01/2016  . Health care maintenance 09/17/2016  . PAD (peripheral artery disease) ([place]) 08/13/2016  . Cerebrovascular accident (CVA) ([place]) 08/09/2016  . Cold extremity without peripheral vascular disease   . Abnormal CT of the chest 07/21/2016  . Acute on chronic combined systolic and diastolic congestive heart failure ([place]) 06/17/2016  .  Tinea pedis 05/27/2016  . NSTEMI (non-ST elevated myocardial infarction) ([place]) 05/16/2016  . Chronic atrial fibrillation 05/16/2016  . Chronic ITP (idiopathic thrombocytopenia) (H[facility])   . CHF (congestive heart failure), NYHA class II, chronic, diastolic ([place])   . CAP (community acquired pneumonia) 05/15/2016  . Nail dystrophy 09/01/2015  . Microscopic hematuria 09/28/2014  . Gait instability 09/27/2014  . Coronary atherosclerosis of native coronary artery   . Paroxysmal atrial flutter ([place])   . Pure hypercholesterolemia   . HTN  (hypertension)   . Hyperlipidemia 04/23/2013  . Atrial arrhythmia 04/23/2013  . Left carpal tunnel syndrome 04/06/2013  . Left hand pain 04/06/2013  . Numbness and tingling in left hand 04/06/2013  . Benign prostate hyperplasia 10/04/2011  . Bladder neck obstruction 10/04/2011  . Pain in joint of right shoulder 09/29/2011  . Impingement syndrome of shoulder region 07/05/2011  . Levator scapula syndrome 07/05/2011  . Enlarged prostate with lower urinary tract symptoms (LUTS) 06/09/2011  . Hematuria, unspecified 06/09/2011  . Congestive heart failure with left ventricular diastolic dysfunction ([place]) 05/25/2011  . Coronary artery disease involving coronary bypass graft with angina pectoris ([place]) 05/25/2011  . Degenerative disk disease 12/20/2010    Past Surgical History:  Procedure Laterality Date  . CARPAL TUNNEL RELEASE    . CORONARY STENT INTERVENTION N/A 03/06/2018   Procedure: CORONARY STENT INTERVENTION;  Surgeon: Lorretta Harp, MD;  Location: [facility];  Service: Cardiovascular;  Laterality: N/A;  . CORONARY STENT PLACEMENT  2009  . ESOPHAGOGASTRODUODENOSCOPY (EGD) WITH PROPOFOL N/A 03/19/2018   Procedure: ESOPHAGOGASTRODUODENOSCOPY (EGD) WITH PROPOFOL;  Surgeon: Wonda Horner, MD;  Location[facility] ENDOS[facility];  Service: Endoscopy;  Laterality: N/A;  . LEFT HEART CATH AND CORONARY ANGIOGRAPHY N/A 03/06/2018   Procedure: LEFT HEART CATH AND CORONARY ANGIOGRAPHY;  Surgeon: Lorretta Harp, MD;  Location: [facility];  Service: Cardiovascular;  Laterality: N/A;  . REPLACEMENT TOTAL KNEE BILATERAL     Left 2014 (Dr. Eddie Dibbles); right 2012 (Dr. Redmond Pulling)  . trigger finger surgery          Home Medications    Prior to Admission medications   Medication Sig Start Date End Date Taking? Authorizing Provider  clopidogrel (PLAVIX) 75 MG tablet Take 1 tablet (75 mg total) by mouth daily with breakfast. 03/08/18   Reino Bellis B, NP  famotidine (PEPCID) 20 MG tablet Take 1  tablet (20 mg total) by mouth 2 (two) times daily. 03/22/18   Shirley, [place], DO  fluticasone (FLONASE) 50 MCG/ACT nasal spray Place 2 sprays into both nostrils daily as needed for allergies or rhinitis.    [provider]  furosemide (LASIX) 20 MG tablet TAKE 20 MG BY MOUTH TWICE DAILY FOR 2 DAYS, THEN TAKE 20 MG ONCE DAILY. Patient taking differently: Take 20 mg by mouth daily.  03/28/18   Bhagat, Crista Luria, PA  HYDROcodone-acetaminophen (NORCO/VICODIN) 5-325 MG tablet Take 1 tablet by mouth every 4 (four) hours as needed. Patient taking differently: Take 1 tablet by mouth every 4 (four) hours as needed for moderate pain.  12/27/17   Charlann Lange, PA-C  loratadine (CLARITIN) 10 MG tablet Take 10 mg by mouth daily as needed for allergies.     [provider]  losartan (COZAAR) 25 MG tablet Take 0.5 tablets (12.5 mg total) by mouth daily. Patient not taking: Reported on 04/07/2018 03/17/18   Lyda Jester M, PA-C  metoprolol tartrate (LOPRESSOR) 25 MG tablet TAKE 1/2 TABLET BY MOUTH 2 TIMES DAILY Patient taking differently:  Take 12.5 mg by mouth 2 (two) times daily.  05/17/17   Burnell Blanks, MD  pantoprazole (PROTONIX) 40 MG tablet Take 1 tablet (40 mg total) by mouth 2 (two) times daily. 03/22/18   Shirley, [place], DO  polyethylene glycol [facility] / GLYCOLAX) packet Take 17 g by mouth daily as needed for mild constipation, moderate constipation or severe constipation. 03/22/18   Shirley, [place], DO  Potassium Chloride ER 20 MEQ TBCR Take 20 mEq by mouth daily. 04/07/18   Glyn Ade, PA-C  pravastatin (PRAVACHOL) 20 MG tablet TAKE 1 TABLET (20 MG TOTAL) BY MOUTH DAILY AT 6 PM. Patient taking differently: Take 20 mg by mouth every evening.  12/20/17   Leeanne Rio, MD  pregabalin (LYRICA) 75 MG capsule Take 1 capsule (75 mg total) by mouth 2 (two) times daily. 05/18/16   Asencion Partridge, MD  rivaroxaban (XARELTO) 2.5 MG TABS tablet Take 1 tablet (2.5 mg total)  by mouth 2 (two) times daily. 03/28/18   [name], MD  senna (SENOKOT) 8.6 MG TABS tablet Take 1 tablet (8.6 mg total) by mouth daily as needed for mild constipation. 03/22/18   Shirley, [place], DO    Family History Family History  Problem Relation Age of Onset  . Cancer Sister   . Cancer Sister     Social History Social History   Tobacco Use  . Smoking status: Never Smoker  . Smokeless tobacco: Never Used  Substance Use Topics  . Alcohol use: No  . Drug use: No     Allergies   Prednisone and Rosuvastatin   Review of Systems Review of Systems  Constitutional: Negative.  Negative for chills and fever.  HENT: Negative.  Negative for rhinorrhea and sore throat.   Eyes: Negative.  Negative for visual disturbance.  Respiratory: Negative.  Negative for cough and shortness of breath.   Cardiovascular: Negative.  Negative for chest pain.  Gastrointestinal: Negative.  Negative for abdominal pain, blood in stool, diarrhea, nausea and vomiting.  Genitourinary: Positive for frequency. Negative for dysuria, flank pain, hematuria, scrotal swelling and testicular pain.  Musculoskeletal: Negative.  Negative for arthralgias and myalgias.  Skin: Negative.  Negative for rash.  Neurological: Negative.  Negative for dizziness, weakness and headaches.     Physical Exam Updated Vital Signs BP 101/68   Pulse 82   Temp 99 F (37.2 C) (Oral)   Resp 17   SpO2 97%   Physical Exam  Constitutional: He is oriented to person, place, and time. He appears well-developed and well-nourished. No distress.  HENT:  Head: Normocephalic and atraumatic.  Right Ear: External ear normal.  Left Ear: External ear normal.  Nose: Nose normal.  Eyes: Pupils are equal, round, and reactive to light. EOM are normal.  Neck: Trachea normal and normal range of motion. No tracheal deviation present.  Cardiovascular: Normal rate, regular rhythm, normal heart sounds and intact distal pulses.  Pulses:       Dorsalis pedis pulses are 2+ on the right side, and 2+ on the left side.       Posterior tibial pulses are 2+ on the right side, and 2+ on the left side.  Pulmonary/Chest: Effort normal and breath sounds normal. No respiratory distress.  Abdominal: Soft. Bowel sounds are normal. There is no tenderness. There is no rigidity, no rebound, no guarding and no CVA tenderness.  Genitourinary:  Genitourinary Comments: Rectal exam chaperoned by Ginny Forth.  Rectal tone normal, squeeze intact.  Musculoskeletal: Normal range of  motion.       Cervical back: Normal.       Thoracic back: Normal.       Lumbar back: Normal.  Feet:  Right Foot:  Protective Sensation: 3 sites tested. 3 sites sensed.  Left Foot:  Protective Sensation: 3 sites tested. 3 sites sensed.  Neurological: He is alert and oriented to person, place, and time. No sensory deficit. GCS eye subscore is 4. GCS verbal subscore is 5. GCS motor subscore is 6.  Mental Status: Alert, oriented, thought content appropriate, able to give a coherent history. Speech fluent without evidence of aphasia. Able to follow 2 step commands without difficulty. Cranial Nerves: II: Peripheral visual fields grossly normal, pupils equal, round, reactive to light III,IV, VI: ptosis not present, extra-ocular motions intact bilaterally V,VII: smile symmetric, eyebrows raise symmetric, facial light touch sensation equal VIII: hearing grossly normal to voice X: uvula elevates symmetrically XI: bilateral shoulder shrug symmetric and strong XII: midline tongue extension without fassiculations Motor: Normal tone. 5/5 strength in upper and lower extremities bilaterally including strong and equal grip strength and dorsiflexion/plantar flexion Sensory: Sensation intact to light touch in all extremities. Cerebellar: normal finger-to-nose with bilateral upper extremities. Normal heel-to -shin balance bilaterally of the lower extremity. No pronator drift.  CV: distal  pulses palpable throughout  Skin: Skin is warm and dry. Capillary refill takes less than 2 seconds.  Psychiatric: He has a normal mood and affect. His behavior is normal.   ED Treatments / Results  Labs (all labs ordered are listed, but only abnormal results are displayed) Labs Reviewed  COMPREHENSIVE METABOLIC PANEL - Abnormal; Notable for the following components:      Result Value   Glucose, Bld 113 (*)    Creatinine, Ser 1.44 (*)    Total Bilirubin 1.8 (*)    GFR calc non Af Amer 41 (*)    GFR calc Af Amer 47 (*)    All other components within normal limits  CBC WITH DIFFERENTIAL/PLATELET - Abnormal; Notable for the following components:   RBC 3.62 (*)    Hemoglobin 10.2 (*)    HCT 32.9 (*)    Platelets 131 (*)    All other components within normal limits  URINALYSIS, ROUTINE W REFLEX MICROSCOPIC - Abnormal; Notable for the following components:   Hgb urine dipstick SMALL (*)    All other components within normal limits  I-STAT CG4 LACTIC ACID, ED  I-STAT CG4 LACTIC ACID, ED    EKG None  Radiology No results found.  Procedures Procedures (including critical care time)  Medications Ordered in ED Medications - No data to display   Initial Impression / Assessment and Plan / ED Course  I have reviewed the triage vital signs and the nursing notes.  Pertinent labs & imaging results that were available during my care of the patient were reviewed by me and considered in my medical decision making (see chart for details).  Clinical Course as of Apr 11 33  Mon Apr 10, 2018  2200 Rectal exam chaperoned by Ginny Forth RN.   [BM]  2207 Case discussed with Dr. Ralene Bathe.  Dr. Ralene Bathe to see patient.   [BM]    Clinical Course User Index [BM] Deliah Boston, PA-C   Patient presenting for urinary frequency that began today.  Bladder scan today shows retention of 390 mL approximately 20 minutes post void.  No focal neuro deficits on exam.  No history of recent fall or trauma.   Rectal tone is strong  with normal squeeze.  Doubt cauda equina.  Family concerned for UTI upon arrival.  Urinalysis is reassuring.  CMP shows increased creatinine, appears baseline.  CBC nonacute.  Lactic acid within normal limits.  Patient denying any and all pain.  Patient is afebrile, not tachycardic, not hypotensive, well-appearing in no acute distress.  Abdomen is nontender on examination today.  Foley has been placed by nursing staff, leg bag has been given.  Plan of care has been discussed with patient and family member at length who agree with discharge with Foley catheter and urology follow-up.  Per patient's daughter patient is established with Dr. Rosana Hoes at [facility] urology here in [place].  It is possible that patient is experiencing urinary retention due to recent medication changes including use of Robaxin.  At this time there does not appear to be any evidence of an acute emergency medical condition and the patient appears stable for discharge with appropriate outpatient follow up. Diagnosis was discussed with patient who verbalizes understanding of care plan and is agreeable to discharge. I have discussed return precautions with patient and daughter who verbalize understanding of return precautions. Patient strongly encouraged to follow-up with their PCP. All questions answered.  Patient has been seen and evaluated by Dr. Ralene Bathe who agrees with plan of care, discharge and urology follow-up with Foley catheter at this time.   Note: Portions of this report may have been transcribed using voice recognition software. Every effort was made to ensure accuracy; however, inadvertent computerized transcription errors may still be present.  Final Clinical Impressions(s) / ED Diagnoses   Final diagnoses:  Urinary retention    ED Discharge Orders    None       Gari Crown 04/11/18 0050    Quintella Reichert, MD 04/11/18 1408

## 2018-04-11 NOTE — Discharge Instructions (Addendum)
Please return to the Emergency Department for any new or worsening symptoms or if your symptoms do not improve. Please be sure to follow up with your Primary Care Physician as soon as possible regarding your visit today. If you do not have a Primary Doctor please use the resources below to establish one. The bladder scan today shows urinary retention.  This can be due to multiple factors including medications such as Robaxin.  The Foley today is to help alleviate the urinary retention.  Please call Dr. Shann Medal [facility] first thing tomorrow morning to schedule an appointment as soon as possible for further evaluation.  Contact a health care provider if: You develop a low-grade fever. You experience spasms or leakage of urine with the spasms. Get help right away if: You develop chills or fever. Your catheter stops draining urine. Your catheter falls out. You start to develop increased bleeding that does not respond to rest and increased fluid intake. Contact a health care provider if: Your urine is cloudy. Your urine smells unusually bad. Your urine is not draining into the bag. Your catheter gets clogged. Your catheter starts to leak. Your bladder feels full. Get help right away if: You have redness, swelling, or pain where the catheter enters your body. You have fluid, pus, or a bad smell coming from the area where the catheter enters your body. The area where the catheter enters your body feels warm to the touch. You have a fever. You have pain in your abdomen, legs, lower back, or bladder. You see blood fill the catheter. Your urine is pink or red. You have nausea, vomiting, or chills. Your catheter gets pulled out.  Do not take your medicine if  develop an itchy rash, swelling in your mouth or lips, or difficulty breathing.   RESOURCE GUIDE  Chronic Pain Problems: Contact [facility]  [phone] Patients need to be referred by their primary  care doctor.  Insufficient Money for Medicine: Contact [employer] Way:  call "211" or [place] [phone].  No Primary Care Doctor: Call [facility] Connect  [phone] - can help you locate a primary care doctor that  accepts your insurance, provides certain services, etc. Physician Referral Service- [phone]  Agencies that provide inexpensive medical care: [facility]  [facility]  [phone] [facility]  [phone] [facility]  [phone] [facility]  [phone] [facility]  [phone]  [place] Providers: [facility]- [address], [address]  [phone], Mon-Fri 9am-7pm, Sat 9am-1pm [place], [address] [place]  [place], [address] [place]  [place]- [address]  [place], [address], [phone]  Only accepts [place] patients after they have their name  applied to their card  Self Pay (no insurance) in [facility]: Sickle Cell Patients: Dr Kevan Ny, [facility]  [place], [facility]- [facility] [place]- [address], [facility]- see information above (Speak to [employer] if you do not have insurance)       -  [facility]- [address], [place] [facility] [place],  [place], [place]  Dr Vista Lawman-  [address], [address], [place], [facility]- [address] [phone]       -  [facility]- [address], [place], also [name] [phone]       -    [facility]- [address], [place], 1st & 3rd Saturday   every month, 10am-1pm  1) Find a Doctor and Pay Out of Pocket Although you won't have to find out who is covered by your insurance plan, it is a good idea to ask around and get recommendations. You will then need to call the office and see if the doctor you have chosen will accept you as a new patient and what types of options they offer for patients who are self-pay. Some doctors offer discounts or will set up payment plans for their patients who do not have insurance, but you will need to ask so you aren't surprised when you get to your appointment.  2) Contact Your Local Health Department Not all health departments have doctors that can see patients for sick visits, but many do, so it is worth a call to see if yours does. If you don't know where your local health department is, you can check in your phone book. The CDC also has a tool to help you locate your state's health department, and many state websites also have listings of all of their local health departments.  3) Find a [facility] If your illness is not likely to be very severe or complicated, you may want to try a walk in clinic. These are popping up all over the country in pharmacies, drugstores, and shopping centers. They're usually staffed by nurse practitioners or physician assistants that have been trained to treat common illnesses and complaints. They're usually fairly quick and inexpensive. However, if you have serious medical issues or chronic medical problems, these are probably not your best option  [facility] [place], [facility], [address] [place], phone [phone] or [phone].  Monday - Friday, call for an appointment. [place], [facility], [address], [place], phone [phone] or [phone].  Monday - Friday, call for an appointment.  Abuse/Neglect: [place] [phone] [place] [phone] (After Hours)  Emergency Shelter:  [facility] [phone]  Maternity Homes: Room at the [place] [phone] [place] [phone]  MRSA Hotline #:   618 430 7812  [facility] Dept. [address] [place]  [facility] Phone:  [phone]                                  Phone:  [phone]                   Phone:  [phone]  [place], [place]- [phone]       -     [facility] in [place], [address]                                  [phone], [place] [phone] or [phone] (After Hours)   [place]  Substance Abuse Resources: [facility] and Drug Services  [phone] [place] [phone] [facility] [place] [phone] Chinita Pester [phone] Residential & Outpatient Substance Abuse Program  [phone]  Psychological Services: [place]  [phone] [place]  [place], [place]. [address] [place], [place]: [phone] or [phone], PicCapture[employer]uy  Dental Assistance  If unable to pay or uninsured, contact:  [facility] or [facility]. to become qualified for the adult dental clinic.  Patients with Medicaid: [facility] [phone], [place] [address] [phone]  If unable to pay, or uninsured, contact [facility] [phone]) or [place] [phone] in [place], [place] in [facility]) to become qualified for the adult dental clinic   Other [place]- [place], [place], [place], [address], [place], [place], 2nd and 4th Thursday of the month at 6:30am.  10 clients each day by appointment, can sometimes see walk-in patients if someone does not show for an appointment. [facility]- [address] Hillard Danker [place], [place], [address], [place], [place], [place], [address], [place] Department- [phone] [place] [facility] [phone]

## 2018-04-14 ENCOUNTER — Ambulatory Visit: Payer: Medicare Other | Admitting: Family Medicine

## 2018-04-14 VITALS — BP 125/60 | HR 78 | Temp 97.7°F | Wt 170.2 lb

## 2018-04-14 DIAGNOSIS — R338 Other retention of urine: Secondary | ICD-10-CM | POA: Diagnosis not present

## 2018-04-14 DIAGNOSIS — M25531 Pain in right wrist: Secondary | ICD-10-CM

## 2018-04-14 MED ORDER — PANTOPRAZOLE SODIUM 40 MG PO TBEC: 40.0000 mg | DELAYED_RELEASE_TABLET | Freq: Two times a day (BID) | ORAL | 0 refills | Status: DC

## 2018-04-14 MED ORDER — SENNA 8.6 MG PO TABS: 1.0000 | ORAL_TABLET | Freq: Every day | ORAL | 0 refills | Status: AC | PRN

## 2018-04-14 MED ORDER — FAMOTIDINE 20 MG PO TABS: 20.0000 mg | ORAL_TABLET | Freq: Two times a day (BID) | ORAL | 0 refills | Status: DC

## 2018-04-14 NOTE — Patient Instructions (Signed)
It was great to meet you today! Thank you for letting me participate in your care!  Today, we discussed your right wrist pain that could have been due to gout. It appears to be responding well to the steroid shot you received at [facility] as it is no longer painful, it is not red, and the swelling has improved. Please keep a look out if the pain, redness, and swelling return.  I'm glad you are no longer having urinary issues. Please return to the urologist if they do return.  I have [facility] [place].  Be well, Harolyn Rutherford, DO PGY-2, [facility]

## 2018-04-14 NOTE — Assessment & Plan Note (Signed)
Arthritis vs Gout. Patient is responding to the corticosteroid shot. Given his improvement I believe it is reasonable to monitor at this time to see if it resolves the issue. If it returns or has signs of redness, increased swelling, warmth, or pain we could start on Colchicine for acute flare.

## 2018-04-14 NOTE — Assessment & Plan Note (Signed)
Resolved. Patient had adequate follow up with urology, removed foley, stopped Robaxin, and is no longer having any urinary issues.

## 2018-04-14 NOTE — Progress Notes (Signed)
Subjective: No chief complaint on file.   HPI: [name] is a 84 y.o. presenting to clinic today to discuss the following:  Follow Up for ED visit due to Urinary Retention Patient was recently sent to the ED after he became confused and stopped urinating. He was recently started on Robaxin for what was thought to be a cervical radiculopathy causing left arm and neck pain. In the ED his U/A was negative for UTI but he did have some urinary retention. Robaxin was stopped and a foley catheter was placed. He was urology outpatient and the foley has been removed and he no longer has any urinary symptoms and no retention.  Right Wrist Pain Patient was recently seen by [facility] orthopedic for right wrist pain and there was consideration if it was gout vs arthritis. He was given an injection of corticosteroid in his wrist and he states that his pain has resolved. He denies any redness, heat, or tenderness today. His swelling he believes is not completely resolved but improved greatly after the injection.  ROS noted in HPI.   Past Medical, Surgical, Social, and Family History Reviewed & Updated per EMR.   Pertinent Historical Findings include:   Social History   Tobacco Use  Smoking Status Never Smoker  Smokeless Tobacco Never Used    Objective: BP 125/60   Pulse 78   Temp 97.7 F (36.5 C) (Oral)   Wt 170 lb 3.2 oz (77.2 kg)   SpO2 97%   BMI 24.42 kg/m  Vitals and nursing notes reviewed  Physical Exam Gen: Alert and Oriented x 3, NAD HEENT: Normocephalic, atraumatic CV: RRR, no murmurs, normal S1, S2 split Resp: CTAB, no wheezing, rales, or rhonchi, comfortable work of breathing MSK: Moves all four extremities; non-erythematous, non-tender right wrist, mild swelling in the right hand Ext: no clubbing, cyanosis, or edema Neuro: No gross deficits Skin: warm, dry, intact, no rashes  No results found for this or any previous visit (from the past 72  hour(s)).  Assessment/Plan:  Right wrist pain Arthritis vs Gout. Patient is responding to the corticosteroid shot. Given his improvement I believe it is reasonable to monitor at this time to see if it resolves the issue. If it returns or has signs of redness, increased swelling, warmth, or pain we could start on Colchicine for acute flare.  Acute urinary retention Resolved. Patient had adequate follow up with urology, removed foley, stopped Robaxin, and is no longer having any urinary issues.   PATIENT EDUCATION PROVIDED: See AVS    Diagnosis and plan along with any newly prescribed medication(s) were discussed in detail with this patient today. The patient verbalized understanding and agreed with the plan. Patient advised if symptoms worsen return to clinic or ER.   Health Maintainance:   No orders of the defined types were placed in this encounter.   Meds ordered this encounter  Medications  . famotidine (PEPCID) 20 MG tablet    Sig: Take 1 tablet (20 mg total) by mouth 2 (two) times daily.    Dispense:  60 tablet    Refill:  0  . pantoprazole (PROTONIX) 40 MG tablet    Sig: Take 1 tablet (40 mg total) by mouth 2 (two) times daily.    Dispense:  60 tablet    Refill:  0  . senna (SENOKOT) 8.6 MG TABS tablet    Sig: Take 1 tablet (8.6 mg total) by mouth daily as needed for mild constipation.  Dispense:  30 each    Refill:  0     Harolyn Rutherford, DO 04/14/2018, 8:49 AM PGY-2 [facility]

## 2018-04-24 ENCOUNTER — Ambulatory Visit: Payer: Medicare Other | Admitting: Cardiovascular Disease

## 2018-04-24 ENCOUNTER — Encounter: Payer: Self-pay | Admitting: Cardiovascular Disease

## 2018-04-24 VITALS — BP 128/70 | HR 96 | Ht 70.0 in | Wt 169.4 lb

## 2018-04-24 DIAGNOSIS — E785 Hyperlipidemia, unspecified: Secondary | ICD-10-CM | POA: Diagnosis not present

## 2018-04-24 DIAGNOSIS — I255 Ischemic cardiomyopathy: Secondary | ICD-10-CM

## 2018-04-24 DIAGNOSIS — I4819 Other persistent atrial fibrillation: Secondary | ICD-10-CM | POA: Diagnosis not present

## 2018-04-24 DIAGNOSIS — I5022 Chronic systolic (congestive) heart failure: Secondary | ICD-10-CM

## 2018-04-24 DIAGNOSIS — I2510 Atherosclerotic heart disease of native coronary artery without angina pectoris: Secondary | ICD-10-CM

## 2018-04-24 NOTE — Progress Notes (Signed)
Chief Complaint  Patient presents with  . Follow-up    CAD     History of Present Illness: 84 yo male with history of CAD, ischemic cardiomyopathy, hyperlipidemia, atrial fibrillation/flutter, DVT, CVA in 2018 who is here today for cardiac follow up. He is known to have CAD with first cardiac cath in 2010 at which time his LAD was occluded and two bare metal stents were placed in the mid LAD. Hid RCA was occluded then. He has atrial fibrillation/flutter and a DVT in 2015 and has been on Xarelto. He was seen in the ED in August and September 2016 for dizziness felt to be due to his muscle relaxer. Head CT and EKG were ok. Seen in ED October 2017 with swollen legs. Venous dopplers negative for DVT. Treated for cellulitis and given Lasix. EKG that day with atrial fib. He was restarted on Xarelto. He was admitted to [facility] November 2017 with pneumonia, hemoptysis and CHF. He was diuresed with IV Lasix. His Xarelto was stopped due to hemoptysis and anemia and he then had a stroke in January 2018. His Xarelto was restarted. Echo January 2018 with normal LVEF=55-60%, moderate MR. He was admitted to [facility] in August 2019 with a NSTEMI. Cardiac cath 03/06/18 with chronically occluded RCA, patent LAD stents and severe stenosis in the obtuse marginal branch treated with a drug eluting stent. Echo 03/06/18 with LVEF=30-35%, mild MR. He was discharged on ASA, Plavix and Xarelto with plans for stopping ASA at one month. Admitted 03/17/18 with acute GI bleeding. EGD showed gastritis but no active bleeding. He was started on Pepcid and Protonix. He was seen by Neurology due to dizziness. Head MRI, Head/Neck CTA and EEG with no acute issues. His ASA was stopped after one month. He has been maintained on Plavix and Xarelto with no further GI bleeding. Losartan has been held due to hypotension.   He is here today for follow up. The patient denies any chest pain, dyspnea, palpitations, lower extremity edema, orthopnea, PND,  dizziness, near syncope or syncope. He feels great. His weight has been stable at home per daily logs that he brings in today. BP has been in the 102-585 systolic range.    Primary Care Physician: Leeanne Rio, MD  Past Medical History:  Diagnosis Date  . Coronary atherosclerosis of native coronary artery    a. Anterior STEMI 2009 s/p BMSx2 to mid & prox LAD and angioplasty to distal LAD. total RCA with collaterals, moderate Cx plaquing. a. EF 55-60% in 2013.  [name] Elevated troponin   . Gross hematuria   . Gross hematuria 11/18/2008   Qualifier: Diagnosis of  By: Owens Shark, RN, BSN, [name]    . Hemoptysis 05/16/2016  . HTN (hypertension)   . Myocardial infarct ([place]) 03/21/2008  . NSVT (nonsustained ventricular tachycardia) ([place])    a. Per DC summary from time of STEMI 2009.  [name] PAF (paroxysmal atrial fibrillation) ([place])   . Paroxysmal atrial flutter ([place])    a. Abnl EKG 01/2012 concerning for atrial flutter, event monitor showed sinus bradycardia only and no pauses. Not felt to be a candidate for longterm anticoag due to hematuria.  . Phlebitis and thrombophlebitis of superficial vessels of lower extremities   . Pure hypercholesterolemia    a. Has not tolerated statins in the past.  . Right leg DVT ([place])    a. Dx 01/2014.  [name] Sinus bradycardia    a. By prior event monitor.  . Sinus bradycardia    a.  By prior event monitor.   . Thrombocytopenia ([place])   . Urinary retention 06/20/2011    Past Surgical History:  Procedure Laterality Date  . CARPAL TUNNEL RELEASE    . CORONARY STENT INTERVENTION N/A 03/06/2018   Procedure: CORONARY STENT INTERVENTION;  Surgeon: Lorretta Harp, MD;  Location: [facility];  Service: Cardiovascular;  Laterality: N/A;  . CORONARY STENT PLACEMENT  2009  . ESOPHAGOGASTRODUODENOSCOPY (EGD) WITH PROPOFOL N/A 03/19/2018   Procedure: ESOPHAGOGASTRODUODENOSCOPY (EGD) WITH PROPOFOL;  Surgeon: Wonda Horner, MD;  Location[facility] ENDOSCOPY;  Service: Endoscopy;   Laterality: N/A;  . LEFT HEART CATH AND CORONARY ANGIOGRAPHY N/A 03/06/2018   Procedure: LEFT HEART CATH AND CORONARY ANGIOGRAPHY;  Surgeon: Lorretta Harp, MD;  Location: [facility];  Service: Cardiovascular;  Laterality: N/A;  . REPLACEMENT TOTAL KNEE BILATERAL     Left 2014 (Dr. Eddie Dibbles); right 2012 (Dr. Redmond Pulling)  . trigger finger surgery      Current Outpatient Medications  Medication Sig Dispense Refill  . clopidogrel (PLAVIX) 75 MG tablet Take 1 tablet (75 mg total) by mouth daily with breakfast. 90 tablet 1  . famotidine (PEPCID) 20 MG tablet Take 1 tablet (20 mg total) by mouth 2 (two) times daily. 60 tablet 0  . fluticasone (FLONASE) 50 MCG/ACT nasal spray Place 2 sprays into both nostrils daily as needed for allergies or rhinitis.    . furosemide (LASIX) 20 MG tablet TAKE 20 MG BY MOUTH TWICE DAILY FOR 2 DAYS, THEN TAKE 20 MG ONCE DAILY. 90 tablet 3  . loratadine (CLARITIN) 10 MG tablet Take 10 mg by mouth daily as needed for allergies.     . metoprolol tartrate (LOPRESSOR) 25 MG tablet TAKE 1/2 TABLET BY MOUTH 2 TIMES DAILY 30 tablet 11  . pantoprazole (PROTONIX) 40 MG tablet Take 1 tablet (40 mg total) by mouth 2 (two) times daily. 60 tablet 0  . polyethylene glycol (MIRALAX / GLYCOLAX) packet Take 17 g by mouth daily as needed for mild constipation, moderate constipation or severe constipation. 14 each 0  . Potassium Chloride ER 20 MEQ TBCR Take 20 mEq by mouth daily. 21 tablet 0  . pravastatin (PRAVACHOL) 20 MG tablet TAKE 1 TABLET (20 MG TOTAL) BY MOUTH DAILY AT 6 PM. 90 tablet 1  . pregabalin (LYRICA) 75 MG capsule Take 1 capsule (75 mg total) by mouth 2 (two) times daily. 60 capsule 0  . rivaroxaban (XARELTO) 2.5 MG TABS tablet Take 1 tablet (2.5 mg total) by mouth 2 (two) times daily. 60 tablet 1  . senna (SENOKOT) 8.6 MG TABS tablet Take 1 tablet (8.6 mg total) by mouth daily as needed for mild constipation. 30 each 0   No current facility-administered medications for  this visit.     Allergies  Allergen Reactions  . Prednisone Other (See Comments)    Does not feel right  . Rosuvastatin Other (See Comments)    Aches    Social History   Socioeconomic History  . Marital status: Married    Spouse name: Not on file  . Number of children: Not on file  . Years of education: Not on file  . Highest education level: Not on file  Occupational History  . Not on file  Social Needs  . Financial resource strain: Not on file  . Food insecurity:    Worry: Not on file    Inability: Not on file  . Transportation needs:    Medical: Not on file  Non-medical: Not on file  Tobacco Use  . Smoking status: Never Smoker  . Smokeless tobacco: Never Used  Substance and Sexual Activity  . Alcohol use: No  . Drug use: No  . Sexual activity: Not on file  Lifestyle  . Physical activity:    Days per week: Not on file    Minutes per session: Not on file  . Stress: Not on file  Relationships  . Social connections:    Talks on phone: Not on file    Gets together: Not on file    Attends religious service: Not on file    Active member of club or organization: Not on file    Attends meetings of clubs or organizations: Not on file    Relationship status: Not on file  . Intimate partner violence:    Fear of current or ex partner: Not on file    Emotionally abused: Not on file    Physically abused: Not on file    Forced sexual activity: Not on file  Other Topics Concern  . Not on file  Social History Narrative   Lives in [place]. Wife has died. Lives next door to his daughter [name]. Also has a very supportive daughter, [name] who lives out of town. 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 Denies tobacco, alcohol, or drug use ever in lifetime.    Family History  Problem Relation Age of Onset  . Cancer Sister   . Cancer Sister     Review of Systems:  As stated in the HPI and otherwise negative.   BP 128/70   Pulse 96   Ht 5\' 10"   (1.778 m)   Wt 169 lb 6.4 oz (76.8 kg)   SpO2 96%   BMI 24.31 kg/m   Physical Examination:  General: Well developed, well nourished, NAD  HEENT: OP clear, mucus membranes moist  SKIN: warm, dry. No rashes. Neuro: No focal deficits  Musculoskeletal: Muscle strength 5/5 all ext  Psychiatric: Mood and affect normal  Neck: No JVD, no carotid bruits, no thyromegaly, no lymphadenopathy.  Lungs:Clear bilaterally, no wheezes, rhonci, crackles Cardiovascular: Regular rate and rhythm. No murmurs, gallops or rubs. Abdomen:Soft. Bowel sounds present. Non-tender.  Extremities: No lower extremity edema. Pulses are 2 + in the bilateral DP/PT.  Echo  August 2019: Left ventricle: The cavity size was normal. Wall thickness was   increased in a pattern of moderate LVH. Systolic function was   moderately to severely reduced. The estimated ejection fraction   was in the range of 30% to 35%. Diffuse hypokinesis. The study is   not technically sufficient to allow evaluation of LV diastolic   function. - Aortic valve: Valve area (VTI): 1.92 cm^2. Valve area (Vmax):   2.04 cm^2. - Mitral valve: There was mild regurgitation. - Left atrium: The atrium was mildly dilated. - Right ventricle: Poorly visualized. Grossly appears enlarged with   decreased function. - Right atrium: Poorly visualized. Grossly appears enlarged. - Atrial septum: No defect or patent foramen ovale was identified. - Pulmonary arteries: Systolic pressure was mildly increased. PA   peak pressure: 36 mm Hg (S). - LVEF is certaintly decreased from Jan 2018 study. Would recommend   limited study with echocontrast to more precisely evaluate   current current function.  EKG:  EKG is not ordered today. The ekg ordered today demonstrates    Recent Labs: 08/25/2017: NT-Pro BNP 2,120 03/03/2018: B Natriuretic Peptide 526.6 04/10/2018: ALT 14; BUN 18; Creatinine, Ser 1.44;  Hemoglobin 10.2; Platelets 131; Potassium 4.1; Sodium 136   Lipid  Panel     Component Value Date/Time   CHOL 104 03/04/2018 0048   CHOL 117 08/09/2017 0840   TRIG 22 03/04/2018 0048   HDL 33 (L) 03/04/2018 0048   HDL 41 08/09/2017 0840   CHOLHDL 3.2 03/04/2018 0048   VLDL 4 03/04/2018 0048   LDLCALC 67 03/04/2018 0048   LDLCALC 65 08/09/2017 0840      Wt Readings from Last 3 Encounters:  04/24/18 169 lb 6.4 oz (76.8 kg)  04/14/18 170 lb 3.2 oz (77.2 kg)  03/28/18 175 lb 3.2 oz (79.5 kg)     Other studies Reviewed: Additional studies/ records that were reviewed today include: . Review of the above records demonstrates:    Assessment and Plan:   1. CAD without angina: No chest pain. Continue Plavix, statin and beta blocker. He is not on ASA since he is on Xarelto.   2. Atrial fibrillation, persistent: Rate controlled atrial fibrillation today. Continue beta blocker and Xarelto.    3. Hyperlipidemia: Lipids well controlled. Continue statin.   4. Chronic combined systolic and diastolic CHF/Ischemic cardiomyopathy: Weight is stable. Volume status is ok. Continue beta blocker. He has not tolerate the low dose Losartan due to hypotension. Will not restart an ARB or Ace-inh. Continue Lasix.   Current medicines are reviewed at length with the patient today.  The patient does not have concerns regarding medicines.  The following changes have been made:  no change  Labs/ tests ordered today include:   No orders of the defined types were placed in this encounter.   Disposition:   FU with my care team in 6 months.   Signed, Lauree Chandler, MD 04/24/2018 2:04 PM    [facility] [address], [place], [place]  [address] Phone: [phone]; Fax: [phone]

## 2018-04-24 NOTE — Patient Instructions (Signed)

## 2018-04-25 ENCOUNTER — Other Ambulatory Visit: Payer: Self-pay

## 2018-04-25 MED ORDER — POTASSIUM CHLORIDE ER 20 MEQ PO TBCR: 20.0000 meq | EXTENDED_RELEASE_TABLET | Freq: Every day | ORAL | 11 refills | Status: DC

## 2018-05-01 ENCOUNTER — Other Ambulatory Visit: Payer: Self-pay | Admitting: Cardiovascular Disease

## 2018-05-10 ENCOUNTER — Other Ambulatory Visit: Payer: Self-pay | Admitting: Family Medicine

## 2018-05-10 NOTE — Telephone Encounter (Signed)
Receiving both PPI and H2 blocker for gastritis and GI bleed on blood thinner.  I will refill but needs appointment with Ardelia Mems to determine if both still indicated.

## 2018-05-18 ENCOUNTER — Other Ambulatory Visit: Payer: Self-pay | Admitting: Cardiovascular Disease

## 2018-05-19 ENCOUNTER — Other Ambulatory Visit: Payer: Self-pay | Admitting: Family Medicine

## 2018-05-22 ENCOUNTER — Telehealth: Payer: Self-pay | Admitting: Cardiovascular Disease

## 2018-05-22 NOTE — Telephone Encounter (Signed)
New message:        *STAT* If patient is at the pharmacy, call can be transferred to refill team.   1. Which medications need to be refilled? (please list name of each medication and dose if known) rivaroxaban (XARELTO) 2.5 MG TABS tablet  2. Which pharmacy/location (including street and city if local pharmacy) is medication to be sent to?[id number] - [place], [place] - [address]  3. Do they need a 30 day or 90 day supply? 30    If unable to fill this medication for any reason please call the patient's daughter [name] at [phone]

## 2018-05-22 NOTE — Telephone Encounter (Signed)
Per medication record and OV from Dr. Mingo Amber; he has been ordering the Xarelto 2.5mg  for the pt. The med was started by Dr. Mingo Amber on 03/28/18 and last ordered by Dr. Mingo Amber on 03/28/18, too. Called daughter [name] and notified her of this so that she is aware. She stated that she forgot that Dr. Cindra Presume office has been handling this and she will call their office at this time.

## 2018-05-23 NOTE — Telephone Encounter (Signed)
Pt daughter [name] is calling and checking on the status of her father xarelto refilled. [facility] [address]. PT daughter would like to know if this can be sent with more than one refill. Daughter would like for someone to call her when this has been sent. The best number to call her is [phone].

## 2018-05-24 NOTE — Telephone Encounter (Signed)
Red team, can you call daughter and clarify what dose patient is taking? The 2.5mg  dose is a little unusual so I just want to be sure it's correct.  Thanks, Leeanne Rio, MD

## 2018-05-24 NOTE — Telephone Encounter (Signed)
Called patient's daughter [name] and was told that she gives patient 2.5 mg bid of Xarelto. One in the am and one in the pm.  [facility] [place] [place]  Marland KitchenOzella Almond, CMA

## 2018-05-25 NOTE — Telephone Encounter (Signed)
Pt's daughter is calling back stating the pharmacy still has not received this Rx. Daughter says he needs to pick this Rx up from the pharmacy today. Please call her once this has been done.

## 2018-05-29 ENCOUNTER — Other Ambulatory Visit: Payer: Self-pay | Admitting: Family Medicine

## 2018-06-16 ENCOUNTER — Other Ambulatory Visit: Payer: Self-pay | Admitting: Family Medicine

## 2018-06-27 ENCOUNTER — Other Ambulatory Visit: Payer: Self-pay | Admitting: Family Medicine

## 2018-07-25 ENCOUNTER — Other Ambulatory Visit: Payer: Self-pay | Admitting: Family Medicine

## 2018-08-15 ENCOUNTER — Telehealth: Payer: Self-pay | Admitting: Cardiovascular Disease

## 2018-08-15 DIAGNOSIS — I739 Peripheral vascular disease, unspecified: Secondary | ICD-10-CM

## 2018-08-15 NOTE — Telephone Encounter (Signed)
New Message   Pt c/o Shortness Of Breath: STAT if SOB developed within the last 24 hours or pt is noticeably SOB on the phone  1. Are you currently SOB (can you hear that pt is SOB on the phone)? Yes   2. How long have you been experiencing SOB? Today it started   3. Are you SOB when sitting or when up moving around? Just when moving around   4. Are you currently experiencing any other symptoms? Tightness in his legs

## 2018-08-15 NOTE — Telephone Encounter (Signed)
Spoke with the patient's daughter [name], per DPR, she stated the patient has had about 8 lbs of weight gain in about 4 days. He weighted 170, four days ago and now he weighs 178. He also has noticed his knees are starting to swell and he has a pressure sensation. The daughter was worried because he had similar situation in the past when he had a clot in his legs. The patient has also has had more difficulty with SOB on exertion lately, however, it resolves with relaxation. The daughter is going to double the patients lasix tomorrow to see if this helps. Advised the patient to go to the hospital if he experiences leg pain. Sending to Dr. Angelena Form for recommendations.

## 2018-08-16 ENCOUNTER — Ambulatory Visit (HOSPITAL_COMMUNITY)
Admission: RE | Admit: 2018-08-16 | Discharge: 2018-08-16 | Disposition: A | Discharge status: Home / Self Care | Payer: Medicare Other | Source: Ambulatory Visit | Attending: Cardiology | Admitting: Cardiology

## 2018-08-16 ENCOUNTER — Other Ambulatory Visit: Payer: Self-pay | Admitting: Cardiovascular Disease

## 2018-08-16 DIAGNOSIS — R0600 Dyspnea, unspecified: Secondary | ICD-10-CM

## 2018-08-16 DIAGNOSIS — Z86718 Personal history of other venous thrombosis and embolism: Secondary | ICD-10-CM

## 2018-08-16 NOTE — Telephone Encounter (Signed)
Spoke with the [name] to follow up with the pt and see how he is doing per Dr. Camillia Herter request and she reports that he is still a little SOB and she says that...  pt is having increased edema in the left knee only, it is a little warm, no redness but pain in his calf when he walks... I advised her that we can make an appt with Dr. Angelena Form tomorrow but will forward to Dr. Angelena Form and see if he has any further recommendations prior to that appt.

## 2018-08-16 NOTE — Telephone Encounter (Signed)
Can we check on him today? If his swelling is not improving with increased dose of Lasix, I can see him tomorrow. One of my TAVR hold blocks can be opened if needed. Thanks, chris

## 2018-08-16 NOTE — Telephone Encounter (Signed)
I can see him tomorrow. I would have a low suspicion for a DVT since he is on Xarelto. We can arrange a lower ext venous doppler though today to exclude DVT. Thanks, chris

## 2018-08-16 NOTE — Telephone Encounter (Signed)
Pt daughter [name] advised and agreed for an appt today for LED of his Left leg at 4pm today at our [facility] office and pt to see Dr. Angelena Form 08/17/18 at 3:20pm.

## 2018-08-17 ENCOUNTER — Encounter: Payer: Self-pay | Admitting: Cardiovascular Disease

## 2018-08-17 ENCOUNTER — Ambulatory Visit: Payer: Medicare Other | Admitting: Cardiovascular Disease

## 2018-08-17 VITALS — BP 120/84 | HR 80 | Ht 70.0 in | Wt 179.4 lb

## 2018-08-17 DIAGNOSIS — I2510 Atherosclerotic heart disease of native coronary artery without angina pectoris: Secondary | ICD-10-CM | POA: Diagnosis not present

## 2018-08-17 DIAGNOSIS — I5043 Acute on chronic combined systolic (congestive) and diastolic (congestive) heart failure: Secondary | ICD-10-CM

## 2018-08-17 DIAGNOSIS — I4819 Other persistent atrial fibrillation: Secondary | ICD-10-CM | POA: Diagnosis not present

## 2018-08-17 NOTE — Progress Notes (Signed)
Chief Complaint  Patient presents with  . Follow-up    CHF   History of Present Illness: 85 yo male with history of CAD, ischemic cardiomyopathy, hyperlipidemia, atrial fibrillation/flutter, DVT, CVA in 2018 who is here today for cardiac follow up. He is known to have CAD with first cardiac cath in 2010 at which time his LAD was occluded and two bare metal stents were placed in the mid LAD. Hid RCA was occluded then. He has atrial fibrillation/flutter and a DVT in 2015 and has been on Xarelto. He was seen in the ED in August and September 2016 for dizziness felt to be due to his muscle relaxer. Head CT and EKG were ok. Seen in ED October 2017 with swollen legs. Venous dopplers negative for DVT. Treated for cellulitis and given Lasix. EKG that day with atrial fib. His Xarelto which had been used for treatment of DVT was restarted at that time. He was admitted to [facility] November 2017 with pneumonia, hemoptysis and CHF. He was diuresed with IV Lasix. His Xarelto was stopped due to hemoptysis and anemia and he then had a stroke in January 2018. His Xarelto was restarted. Echo January 2018 with normal LVEF=55-60%, moderate MR. He was admitted to [facility] in August 2019 with a NSTEMI. Cardiac cath 03/06/18 with chronically occluded RCA, patent LAD stents and severe stenosis in the obtuse marginal branch treated with a drug eluting stent. Echo 03/06/18 with LVEF=30-35%, mild MR. He was discharged on ASA, Plavix and Xarelto with plans for stopping ASA at one month. Admitted 03/17/18 with acute GI bleeding. EGD showed gastritis but no active bleeding. He was started on Pepcid and Protonix. He was seen by Neurology due to dizziness. Head MRI, Head/Neck CTA and EEG with no acute issues. His ASA was stopped after one month. He has been maintained on Plavix and Xarelto with no further GI bleeding. Losartan has been held due to hypotension.   He is here today for follow up. He called our office this week and reported worsened  lower extremity edema and pain in his left calf. We arranged lower ext venous dopplers on 08/16/18 that were negative for DVT. The patient denies any chest pain, palpitations, orthopnea, PND, dizziness, near syncope or syncope. He tells me that had been wearing new compression stockings. His left knee was swollen but when he took off these stocking yesterday the swelling resolved. He is up 10 lbs over last 4 months. He has slight worsening of dyspnea on exertion.    Primary Care Physician: Leeanne Rio, MD  Past Medical History:  Diagnosis Date  . Coronary atherosclerosis of native coronary artery    a. Anterior STEMI 2009 s/p BMSx2 to mid & prox LAD and angioplasty to distal LAD. total RCA with collaterals, moderate Cx plaquing. a. EF 55-60% in 2013.  [name] Elevated troponin   . Gross hematuria   . Gross hematuria 11/18/2008   Qualifier: Diagnosis of  By: Owens Shark, RN, BSN, [name]    . Hemoptysis 05/16/2016  . HTN (hypertension)   . Myocardial infarct ([place]) 03/21/2008  . NSVT (nonsustained ventricular tachycardia) ([place])    a. Per DC summary from time of STEMI 2009.  [name] PAF (paroxysmal atrial fibrillation) ([place])   . Paroxysmal atrial flutter ([place])    a. Abnl EKG 01/2012 concerning for atrial flutter, event monitor showed sinus bradycardia only and no pauses. Not felt to be a candidate for longterm anticoag due to hematuria.  . Phlebitis and thrombophlebitis of superficial  vessels of lower extremities   . Pure hypercholesterolemia    a. Has not tolerated statins in the past.  . Right leg DVT ([place])    a. Dx 01/2014.  [name] Sinus bradycardia    a. By prior event monitor.  . Sinus bradycardia    a. By prior event monitor.   . Thrombocytopenia ([place])   . Urinary retention 06/20/2011    Past Surgical History:  Procedure Laterality Date  . CARPAL TUNNEL RELEASE    . CORONARY STENT INTERVENTION N/A 03/06/2018   Procedure: CORONARY STENT INTERVENTION;  Surgeon: Lorretta Harp, MD;  Location: [place] [facility];  Service: Cardiovascular;  Laterality: N/A;  . CORONARY STENT PLACEMENT  2009  . ESOPHAGOGASTRODUODENOSCOPY (EGD) WITH PROPOFOL N/A 03/19/2018   Procedure: ESOPHAGOGASTRODUODENOSCOPY (EGD) WITH PROPOFOL;  Surgeon: Wonda Horner, MD;  Location: [facility] ENDOSCOPY;  Service: Endoscopy;  Laterality: N/A;  . LEFT HEART CATH AND CORONARY ANGIOGRAPHY N/A 03/06/2018   Procedure: LEFT HEART CATH AND CORONARY ANGIOGRAPHY;  Surgeon: Lorretta Harp, MD;  Location: [place] [facility];  Service: Cardiovascular;  Laterality: N/A;  . REPLACEMENT TOTAL KNEE BILATERAL     Left 2014 (Dr. Eddie Dibbles); right 2012 (Dr. Redmond Pulling)  . trigger finger surgery      Current Outpatient Medications  Medication Sig Dispense Refill  . clopidogrel (PLAVIX) 75 MG tablet Take 1 tablet (75 mg total) by mouth daily with breakfast. 90 tablet 1  . famotidine (PEPCID) 20 MG tablet TAKE 1 TABLET BY MOUTH TWICE A DAY 60 tablet 5  . fluticasone (FLONASE) 50 MCG/ACT nasal spray Place 2 sprays into both nostrils daily as needed for allergies or rhinitis.    . furosemide (LASIX) 20 MG tablet TAKE 1 TABLET BY MOUTH EVERY DAY 30 tablet 1  . loratadine (CLARITIN) 10 MG tablet Take 10 mg by mouth daily as needed for allergies.     . metoprolol tartrate (LOPRESSOR) 25 MG tablet TAKE 1/2 TABLET BY MOUTH TWICE A DAY 30 tablet 11  . pantoprazole (PROTONIX) 40 MG tablet TAKE 1 TABLET BY MOUTH TWICE A DAY 60 tablet 5  . polyethylene glycol (MIRALAX / GLYCOLAX) packet Take 17 g by mouth daily as needed for mild constipation, moderate constipation or severe constipation. 14 each 0  . Potassium Chloride ER 20 MEQ TBCR Take 20 mEq by mouth daily. 30 tablet 11  . pravastatin (PRAVACHOL) 20 MG tablet TAKE 1 TABLET (20 MG TOTAL) BY MOUTH DAILY AT 6 PM. 90 tablet 1  . pregabalin (LYRICA) 75 MG capsule Take 1 capsule (75 mg total) by mouth 2 (two) times daily. 60 capsule 0  . senna (SENOKOT) 8.6 MG TABS tablet Take 1 tablet (8.6 mg total) by mouth  daily as needed for mild constipation. 30 each 0  . XARELTO 2.5 MG TABS tablet TAKE 1 TABLET BY MOUTH TWICE A DAY 60 tablet 2   No current facility-administered medications for this visit.     Allergies  Allergen Reactions  . Prednisone Other (See Comments)    Does not feel right  . Rosuvastatin Other (See Comments)    Aches    Social History   Socioeconomic History  . Marital status: Married    Spouse name: Not on file  . Number of children: Not on file  . Years of education: Not on file  . Highest education level: Not on file  Occupational History  . Not on file  Social Needs  . Financial resource strain: Not on file  .  Food insecurity:    Worry: Not on file    Inability: Not on file  . Transportation needs:    Medical: Not on file    Non-medical: Not on file  Tobacco Use  . Smoking status: Never Smoker  . Smokeless tobacco: Never Used  Substance and Sexual Activity  . Alcohol use: No  . Drug use: No  . Sexual activity: Not on file  Lifestyle  . Physical activity:    Days per week: Not on file    Minutes per session: Not on file  . Stress: Not on file  Relationships  . Social connections:    Talks on phone: Not on file    Gets together: Not on file    Attends religious service: Not on file    Active member of club or organization: Not on file    Attends meetings of clubs or organizations: Not on file    Relationship status: Not on file  . Intimate partner violence:    Fear of current or ex partner: Not on file    Emotionally abused: Not on file    Physically abused: Not on file    Forced sexual activity: Not on file  Other Topics Concern  . Not on file  Social History Narrative   Lives in [place]. Wife has died. Lives next door to his daughter [name]. Also has a very supportive daughter, [name] who lives out of town. 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 Denies tobacco, alcohol, or drug use ever in lifetime.    Family History    Problem Relation Age of Onset  . Cancer Sister   . Cancer Sister     Review of Systems:  As stated in the HPI and otherwise negative.   BP 120/84   Pulse 80   Ht 5\' 10"  (1.778 m)   Wt 179 lb 6.4 oz (81.4 kg)   SpO2 96%   BMI 25.74 kg/m   Physical Examination:  General: Well developed, well nourished, NAD  HEENT: OP clear, mucus membranes moist  SKIN: warm, dry. No rashes. Neuro: No focal deficits  Musculoskeletal: Muscle strength 5/5 all ext  Psychiatric: Mood and affect normal  Neck: No JVD, no carotid bruits, no thyromegaly, no lymphadenopathy.  Lungs:Clear bilaterally, no wheezes, rhonci, crackles Cardiovascular: Irreg irreg. No murmurs, gallops or rubs. Abdomen:Soft. Bowel sounds present. Non-tender.  Extremities: Trace lower extremity edema. Pulses are 2 + in the bilateral DP/PT.  Echo  August 2019: Left ventricle: The cavity size was normal. Wall thickness was   increased in a pattern of moderate LVH. Systolic function was   moderately to severely reduced. The estimated ejection fraction   was in the range of 30% to 35%. Diffuse hypokinesis. The study is   not technically sufficient to allow evaluation of LV diastolic   function. - Aortic valve: Valve area (VTI): 1.92 cm^2. Valve area (Vmax):   2.04 cm^2. - Mitral valve: There was mild regurgitation. - Left atrium: The atrium was mildly dilated. - Right ventricle: Poorly visualized. Grossly appears enlarged with   decreased function. - Right atrium: Poorly visualized. Grossly appears enlarged. - Atrial septum: No defect or patent foramen ovale was identified. - Pulmonary arteries: Systolic pressure was mildly increased. PA   peak pressure: 36 mm Hg (S). - LVEF is certaintly decreased from Jan 2018 study. Would recommend   limited study with echocontrast to more precisely evaluate   current current function.  EKG:  EKG is ordered  today. The ekg ordered today demonstrates  Atrial fib, rate 80 bpm. Incomplete  RBBB. Poor R wave progression precordial leads.   Recent Labs: 08/25/2017: NT-Pro BNP 2,120 03/03/2018: B Natriuretic Peptide 526.6 04/10/2018: ALT 14; BUN 18; Creatinine, Ser 1.44; Hemoglobin 10.2; Platelets 131; Potassium 4.1; Sodium 136   Lipid Panel     Component Value Date/Time   CHOL 104 03/04/2018 0048   CHOL 117 08/09/2017 0840   TRIG 22 03/04/2018 0048   HDL 33 (L) 03/04/2018 0048   HDL 41 08/09/2017 0840   CHOLHDL 3.2 03/04/2018 0048   VLDL 4 03/04/2018 0048   LDLCALC 67 03/04/2018 0048   LDLCALC 65 08/09/2017 0840      Wt Readings from Last 3 Encounters:  08/17/18 179 lb 6.4 oz (81.4 kg)  04/24/18 169 lb 6.4 oz (76.8 kg)  04/14/18 170 lb 3.2 oz (77.2 kg)     Other studies Reviewed: Additional studies/ records that were reviewed today include: . Review of the above records demonstrates:    Assessment and Plan:   1. CAD without angina: He has no chest pain. He is not on ASA since he is on Xarelto. Continue Plavix, statin and beta blocker.   2. Atrial fibrillation, persistent: He is in atrial fib today. Rate controlled at 80 bpm. Will continue beta blocker and Xarelto.     3. Hyperlipidemia: Continue statin.   4. Acute on chronic combined systolic and diastolic CHF/Ischemic cardiomyopathy: LVEF=30-35% by echo August 2019. He has since has revascularization of the obtuse marginal. There is chronic occlusion of the RCA dating back to 2010. He has not tolerated Ace-inh or ARB due to hypotension. He has been on a beta blocker.  Given volume overload, will increase Lasix to 40 mg daily for 4 days and KDur to 40 meq daily while taking the increased dose of Lasix. Follow daily weights. Salt and fluid restriction. Continue beta blocker. Resume normal dose of Lasix after 4 days if weight is down. BMET today  Current medicines are reviewed at length with the patient today.  The patient does not have concerns regarding medicines.  The following changes have been made:  no  change  Labs/ tests ordered today include:   Orders Placed This Encounter  Procedures  . Basic Metabolic Panel (BMET)    Disposition:   FU with my care team in 6 months.   Signed, Lauree Chandler, MD 08/17/2018 4:00 PM    [facility] [place], [place], [place]  [address] Phone: [phone]; Fax: [phone]

## 2018-08-17 NOTE — Patient Instructions (Signed)
Medication Instructions:  Your physician has recommended you make the following change in your medication: Double furosemide and potassium doses for the next 4 days.    If you need a refill on your cardiac medications before your next appointment, please call your pharmacy.   Lab work: Lab work to be done today--BMP If you have labs (blood work) drawn today and your tests are completely normal, you will receive your results only by: [name] MyChart Message (if you have MyChart) OR . A paper copy in the mail If you have any lab test that is abnormal or we need to change your treatment, we will call you to review the results.  Testing/Procedures: none  Follow-Up: Follow up with Dr. Angelena Form as planned on April 16,2020

## 2018-08-18 LAB — BASIC METABOLIC PANEL
BUN / CREAT RATIO: 23 (ref 10–24)
BUN: 36 mg/dL (ref 10–36)
CALCIUM: 9.7 mg/dL (ref 8.6–10.2)
CO2: 23 mmol/L (ref 20–29)
CREATININE: 1.57 mg/dL — AB (ref 0.76–1.27)
Chloride: 102 mmol/L (ref 96–106)
GFR calc non Af Amer: 38 mL/min/{1.73_m2} — ABNORMAL LOW (ref >59)
GFR, EST AFRICAN AMERICAN: 44 mL/min/{1.73_m2} — AB (ref >59)
GLUCOSE: 99 mg/dL (ref 65–99)
Potassium: 4.1 mmol/L (ref 3.5–5.2)
Sodium: 143 mmol/L (ref 134–144)

## 2018-08-22 NOTE — Addendum Note (Signed)
Addended by: Rose Phi on: 08/22/2018 10:50 AM   Modules accepted: Orders

## 2018-08-27 ENCOUNTER — Other Ambulatory Visit (HOSPITAL_COMMUNITY): Payer: Self-pay | Admitting: Cardiology

## 2018-09-05 ENCOUNTER — Other Ambulatory Visit: Payer: Self-pay | Admitting: Cardiology

## 2018-09-05 MED ORDER — FUROSEMIDE 20 MG PO TABS: 20.0000 mg | ORAL_TABLET | Freq: Every day | ORAL | 3 refills | Status: DC

## 2018-09-05 NOTE — Addendum Note (Signed)
Addended by: Derl Barrow on: 09/05/2018 12:43 PM   Modules accepted: Orders

## 2018-09-12 ENCOUNTER — Telehealth: Payer: Self-pay | Admitting: Cardiovascular Disease

## 2018-09-12 MED ORDER — POTASSIUM CHLORIDE ER 20 MEQ PO TBCR: 20.0000 meq | EXTENDED_RELEASE_TABLET | Freq: Every day | ORAL | 3 refills | Status: DC

## 2018-09-12 NOTE — Telephone Encounter (Signed)
New message   Pt c/o swelling: STAT is pt has developed SOB within 24 hours  1) How much weight have you gained and in what time span? Since  09/09/18 has gained 2lbs   2) If swelling, where is the swelling located? In left leg   3) Are you currently taking a fluid pill? Yes   4) Are you currently SOB? No   5) Do you have a log of your daily weights (if so, list)? Yes, can provide if needed   6) Have you gained 3 pounds in a day or 5 pounds in a week? 5 lbs in a weeks   7) Have you traveled recently? No

## 2018-09-12 NOTE — Telephone Encounter (Signed)
I spoke with pt's daughter. She reports pt has gained 2 lbs per day since Saturday for total of 8 lbs since Saturday. Currently weighs 180 lbs.  No shortness of breath. Left leg swelling. Pt has had this in past and was negative for DVT on 08/16/18. Has not checked BP but is not having any dizziness. At last office visit lasix and potassium were doubled for 4 days due to weight gain. Pt then returned to normal dose and has been OK until this past Saturday.  I instructed daughter to have pt take extra dose of lasix and potassium daily for next 3 days.  Watch salt intake. Instructed to call back if no improvement.

## 2018-09-12 NOTE — Telephone Encounter (Signed)
Agree. Thanks

## 2018-09-27 ENCOUNTER — Telehealth: Payer: Self-pay | Admitting: Cardiovascular Disease

## 2018-09-27 DIAGNOSIS — I5043 Acute on chronic combined systolic (congestive) and diastolic (congestive) heart failure: Secondary | ICD-10-CM

## 2018-09-27 MED ORDER — FUROSEMIDE 40 MG PO TABS: 40.0000 mg | ORAL_TABLET | Freq: Every day | ORAL | 2 refills | Status: DC

## 2018-09-27 MED ORDER — POTASSIUM CHLORIDE CRYS ER 20 MEQ PO TBCR: 40.0000 meq | EXTENDED_RELEASE_TABLET | Freq: Every day | ORAL | 2 refills | Status: DC

## 2018-09-27 NOTE — Telephone Encounter (Signed)
Spoke with pts daughter, [name] (DPR on file)  She reports that pt has gained 5 pounds since Friday 3/13, denies SOB or Ch Pain Pt c/o knee pain and L ankle swelling that is worsening  Will route to Scientist, clinical (histocompatibility and immunogenetics) for advisement.

## 2018-09-27 NOTE — Addendum Note (Signed)
Addended by: Thompson Grayer on: 09/27/2018 12:10 PM   Modules accepted: Orders

## 2018-09-27 NOTE — Telephone Encounter (Signed)
I would increase his Lasix to 40 mg every day and Kdur to 40 meq every day and check BMET in one week to ten days. If does not resolve, will need to be seen in the office. cdm

## 2018-09-27 NOTE — Telephone Encounter (Signed)
Pt's daughter notified. Will send new prescriptions to  [facility] [place]. Pt will come in for BMP on 10/06/18.

## 2018-09-27 NOTE — Addendum Note (Signed)
Addended byEarley Favor, Mardene Celeste L on: 09/27/2018 12:12 PM   Modules accepted: Orders

## 2018-09-27 NOTE — Telephone Encounter (Signed)
New Message   Pt c/o swelling: STAT is pt has developed SOB within 24 hours  1) How much weight have you gained and in what time span?Since Friday he has gained 5 lbs 174-179  2) If swelling, where is the swelling located? Legs  3) Are you currently taking a fluid pill? Yes   4) Are you currently SOB? A little   5) Do you have a log of your daily weights (if so, list)? Yes  6) Have you gained 3 pounds in a day or 5 pounds in a week? Yes  7) Have you traveled recently? No   Pt is complaining of knee and ankle pain

## 2018-10-02 NOTE — Telephone Encounter (Signed)
Daughter called to report that swelling has gone down, but Dad complains about tightness and weakness in his knees, to the point where they give out on him

## 2018-10-05 ENCOUNTER — Telehealth: Payer: Self-pay | Admitting: Cardiovascular Disease

## 2018-10-05 NOTE — Telephone Encounter (Signed)
New Message   Patients daughter is calling on his behalf. He is suppose to come in for lab works due to the increase in his lasix. She is wanting to know is it imperative that he come in tomorrow due to the covid19. Please call.

## 2018-10-05 NOTE — Telephone Encounter (Signed)
Family was called and explained why test was ordered, family agreed that she felt it was important to recheck his kidney function and will bring for lab draw as ordered.

## 2018-10-06 ENCOUNTER — Other Ambulatory Visit: Payer: Self-pay

## 2018-10-06 ENCOUNTER — Other Ambulatory Visit: Payer: Medicare Other | Admitting: *Deleted

## 2018-10-06 ENCOUNTER — Telehealth: Payer: Self-pay | Admitting: Cardiovascular Disease

## 2018-10-06 DIAGNOSIS — I5043 Acute on chronic combined systolic (congestive) and diastolic (congestive) heart failure: Secondary | ICD-10-CM

## 2018-10-06 LAB — BASIC METABOLIC PANEL
BUN / CREAT RATIO: 20 (ref 10–24)
BUN: 44 mg/dL — ABNORMAL HIGH (ref 10–36)
CHLORIDE: 101 mmol/L (ref 96–106)
CO2: 23 mmol/L (ref 20–29)
CREATININE: 2.22 mg/dL — AB (ref 0.76–1.27)
Calcium: 9.3 mg/dL (ref 8.6–10.2)
GFR calc Af Amer: 29 mL/min/{1.73_m2} — ABNORMAL LOW (ref >59)
GFR calc non Af Amer: 25 mL/min/{1.73_m2} — ABNORMAL LOW (ref >59)
GLUCOSE: 144 mg/dL — AB (ref 65–99)
POTASSIUM: 4.2 mmol/L (ref 3.5–5.2)
SODIUM: 142 mmol/L (ref 134–144)

## 2018-10-06 NOTE — Telephone Encounter (Signed)
See lab Results note ./cy

## 2018-10-06 NOTE — Telephone Encounter (Signed)
New message   Patient's daughter is calling for lab results.

## 2018-10-07 ENCOUNTER — Other Ambulatory Visit: Payer: Self-pay | Admitting: Family Medicine

## 2018-10-09 NOTE — Telephone Encounter (Signed)
Thanks. cdm 

## 2018-10-10 ENCOUNTER — Telehealth: Payer: Self-pay

## 2018-10-10 ENCOUNTER — Other Ambulatory Visit: Payer: Self-pay

## 2018-10-10 MED ORDER — RIVAROXABAN 2.5 MG PO TABS: 2.5000 mg | ORAL_TABLET | Freq: Two times a day (BID) | ORAL | 0 refills | Status: DC

## 2018-10-10 NOTE — Telephone Encounter (Signed)
   Primary Cardiologist:  Lauree Chandler, MD   Patients daughter, [name] contacted. (DPR on file)  History reviewed.  No symptoms to suggest any unstable cardiac conditions.  Based on discussion, with current pandemic situation, we will be postponing this appointment for [name] with a plan for f/u in 6 wks or sooner if feasible/necessary.  If symptoms change, he has been instructed to contact our office.   Routing to C19 CANCEL pool for tracking (P CV DIV CV19 CANCEL - reason for visit "other.") and assigning priority (1 = 4-6 wks, 2 = 6-12 wks, 3 = >12 wks).    I instructed [name] to call back with weight log for pt on Friday of this week as his Lasix med was recently changed so that [place] could advise on if the dose needs to be readjusted. [name] and pt agreeable that pt does not need to be seen at this time but will call with any acute symptoms.   Wilma Flavin, RN  10/10/2018 3:57 PM         .

## 2018-10-16 NOTE — Telephone Encounter (Signed)
Called pt to set up evisit, pt declined and would rather wait for an OV. Pt is aware that it will be around August, instructed them to call the office in July if they have not heard from [place].

## 2018-10-16 NOTE — Telephone Encounter (Signed)
85 yr old with recent chf needs f/u. See if he can do doximity if not a phone visit should be made

## 2018-10-19 NOTE — Telephone Encounter (Signed)
Called and spoke to patient's daughter [name] regarding follow up appointment and she agreed to help patient with VIDEO Visit with Ermalinda Barrios, PA via [place] on 4/14.      Virtual Visit Pre-Appointment Phone Call  TELEPHONE CALL NOTE  DARSHAN SOLANKI has been deemed a candidate for a follow-up tele-health visit to limit community exposure during the Covid-19 pandemic. I spoke with the patient via phone to ensure availability of phone/video source, confirm preferred email & phone number, and discuss instructions and expectations.  I reminded DATRON BRAKEBILL to be prepared with any vital sign and/or heart rhythm information that could potentially be obtained via home monitoring, at the time of his visit. I reminded JHAMAL PLUCINSKI to expect a phone call at the time of his visit if his visit.  Did the patient verbally acknowledge consent to treatment? [place], RN 10/19/2018 6:42 PM   CONSENT FOR TELE-HEALTH VISIT - PLEASE REVIEW  I hereby voluntarily request, consent and authorize [facility] and its employed or contracted physicians, physician assistants, nurse practitioners or other licensed health care professionals (the Practitioner), to provide me with telemedicine health care services (the "Services") as deemed necessary by the treating Practitioner. I acknowledge and consent to receive the Services by the Practitioner via telemedicine. I understand that the telemedicine visit will involve communicating with the Practitioner through live audiovisual communication technology and the disclosure of certain medical information by electronic transmission. I acknowledge that I have been given the opportunity to request an in-person assessment or other available alternative prior to the telemedicine visit and am voluntarily participating in the telemedicine visit.  I understand that I have the right to withhold or withdraw my consent to the use of telemedicine in  the course of my care at any time, without affecting my right to future care or treatment, and that the Practitioner or I may terminate the telemedicine visit at any time. I understand that I have the right to inspect all information obtained and/or recorded in the course of the telemedicine visit and may receive copies of available information for a reasonable fee.  I understand that some of the potential risks of receiving the Services via telemedicine include:  [name] Delay or interruption in medical evaluation due to technological equipment failure or disruption; . Information transmitted may not be sufficient (e.g. poor resolution of images) to allow for appropriate medical decision making by the Practitioner; and/or  . In rare instances, security protocols could fail, causing a breach of personal health information.  Furthermore, I acknowledge that it is my responsibility to provide information about my medical history, conditions and care that is complete and accurate to the best of my ability. I acknowledge that Practitioner's advice, recommendations, and/or decision may be based on factors not within their control, such as incomplete or inaccurate data provided by me or distortions of diagnostic images or specimens that may result from electronic transmissions. I understand that the practice of medicine is not an exact science and that Practitioner makes no warranties or guarantees regarding treatment outcomes. I acknowledge that I will receive a copy of this consent concurrently upon execution via email to the email address I last provided but may also request a printed copy by calling the office of [place].    I understand that my insurance will be billed for this visit.   I have read or had this consent read to me. . I understand the contents of this consent, which adequately  explains the benefits and risks of the Services being provided via telemedicine.  . I have been provided ample  opportunity to ask questions regarding this consent and the Services and have had my questions answered to my satisfaction. . I give my informed consent for the services to be provided through the use of telemedicine in my medical care  By participating in this telemedicine visit I agree to the above.

## 2018-10-23 NOTE — Progress Notes (Signed)
Virtual Visit via telephone   This visit type was conducted due to national recommendations for restrictions regarding the COVID-19 Pandemic (e.g. social distancing) in an effort to limit this patient's exposure and mitigate transmission in our community.  Due to his co-morbid illnesses, this patient is at least at moderate risk for complications without adequate follow up.  This format is felt to be most appropriate for this patient at this time.  All issues noted in this document were discussed and addressed.    Please refer to the patient's chart for his consent to telehealth for [facility].   Evaluation Performed:  Follow-up visit CHF  Date:  10/24/2018   ID:  [name], DOB 02/15/1924, MRN [id number]  Patient Location: Home  Provider Location: Home  PCP:  Alan Rio, MD  Cardiologist:  Alan Chandler, MD  Electrophysiologist:  None   Chief Complaint:  F/u CHF  History of Present Illness:    [name] is a 85 y.o. male who presents via audio/video conferencing for a telehealth visit today.    Patient has history of CAD S/P cath 2010 BMS x2 LAD and RCA was occluded.NSTEMI with Cath 02/2018 CTO RCA, patent LAD stents and severe stenosis OM branch treated with DES.ICM, Echo LVEF 30-35% mild MR treated with ASA, Plavix and Xarelto with plans to stop ASA after one month. GI bleed 03/2009 gastritis. ASA stopped. Also has PAF on Xarelto, HLD, DVT, CVA 2018,   Last saw Dr. Angelena [name] 08/17/18 and has worsening edema,and 10 lb weight gain. Lasix was increased to 40 mg daily for 4 days then back to 20 mg daily. Called in 09/12/18 with 8 lb weight gain and lasix increased again for 3 days.09/27/18 swelling down but complained of tightness and weakness of his knees. 08/17/18 Crt 1.57, 10/06/18 Crt 2.22.  Patient and daughter, [name], were unable to get video to work so converted to telephone visit. Swelling down now and weight 177 lbs today. When weight gets  up to 179 lb. Or more he gets short of breath and complains of leg swelling and tightness in his legs. His daughter has given him extra lasix and potassium. Denies dyspnea, dizziness or presyncope. Allergies bothering him some.   The patient does not have symptoms concerning for COVID-19 infection (fever, chills, cough, or new shortness of breath).    Past Medical History:  Diagnosis Date  . Coronary atherosclerosis of native coronary artery    a. Anterior STEMI 2009 s/p BMSx2 to mid & prox LAD and angioplasty to distal LAD. total RCA with collaterals, moderate Cx plaquing. a. EF 55-60% in 2013.  [name] Elevated troponin   . Gross hematuria   . Gross hematuria 11/18/2008   Qualifier: Diagnosis of  By: Alan Shark, RN, BSN, [name]    . Hemoptysis 05/16/2016  . HTN (hypertension)   . Myocardial infarct ([place]) 03/21/2008  . NSVT (nonsustained ventricular tachycardia) ([place])    a. Per DC summary from time of STEMI 2009.  [name] PAF (paroxysmal atrial fibrillation) ([place])   . Paroxysmal atrial flutter ([place])    a. Abnl EKG 01/2012 concerning for atrial flutter, event monitor showed sinus bradycardia only and no pauses. Not felt to be a candidate for longterm anticoag due to hematuria.  . Phlebitis and thrombophlebitis of superficial vessels of lower extremities   . Pure hypercholesterolemia    a. Has not tolerated statins in the past.  . Right leg DVT ([place])    a. Dx 01/2014.  [name]  Sinus bradycardia    a. By prior event monitor.  . Sinus bradycardia    a. By prior event monitor.   . Thrombocytopenia ([place])   . Urinary retention 06/20/2011   Past Surgical History:  Procedure Laterality Date  . CARPAL TUNNEL RELEASE    . CORONARY STENT INTERVENTION N/A 03/06/2018   Procedure: CORONARY STENT INTERVENTION;  Surgeon: Alan Harp, MD;  Location: [place] [facility];  Service: Cardiovascular;  Laterality: N/A;  . CORONARY STENT PLACEMENT  2009  . ESOPHAGOGASTRODUODENOSCOPY (EGD) WITH PROPOFOL N/A 03/19/2018    Procedure: ESOPHAGOGASTRODUODENOSCOPY (EGD) WITH PROPOFOL;  Surgeon: Alan Horner, MD;  Location: [facility] ENDOSCOPY;  Service: Endoscopy;  Laterality: N/A;  . LEFT HEART CATH AND CORONARY ANGIOGRAPHY N/A 03/06/2018   Procedure: LEFT HEART CATH AND CORONARY ANGIOGRAPHY;  Surgeon: Alan Harp, MD;  Location: [place] [facility];  Service: Cardiovascular;  Laterality: N/A;  . REPLACEMENT TOTAL KNEE BILATERAL     Left 2014 (Dr. Eddie [name]); right 2012 (Dr. Redmond [name])  . trigger finger surgery       Current Meds  Medication Sig  . clopidogrel (PLAVIX) 75 MG tablet TAKE 1 TABLET (75 MG TOTAL) BY MOUTH DAILY WITH BREAKFAST.  . famotidine (PEPCID) 20 MG tablet TAKE 1 TABLET BY MOUTH TWICE A DAY  . fluticasone (FLONASE) 50 MCG/ACT nasal spray Place 2 sprays into both nostrils daily as needed for allergies or rhinitis.  . furosemide (LASIX) 40 MG tablet Take 1 tablet (40 mg total) by mouth daily.  [name] loratadine (CLARITIN) 10 MG tablet Take 10 mg by mouth daily as needed for allergies.   . metoprolol tartrate (LOPRESSOR) 25 MG tablet TAKE 1/2 TABLET BY MOUTH TWICE A DAY  . pantoprazole (PROTONIX) 40 MG tablet TAKE 1 TABLET BY MOUTH TWICE A DAY  . polyethylene glycol (MIRALAX / GLYCOLAX) packet Take 17 g by mouth daily as needed for mild constipation, moderate constipation or severe constipation.  . potassium chloride SA (K-DUR,KLOR-CON) 20 MEQ tablet Take 2 tablets (40 mEq total) by mouth daily.  . pravastatin (PRAVACHOL) 20 MG tablet TAKE 1 TABLET (20 MG TOTAL) BY MOUTH DAILY AT 6 PM.  . pregabalin (LYRICA) 75 MG capsule Take 1 capsule (75 mg total) by mouth 2 (two) times daily.  . rivaroxaban (XARELTO) 2.5 MG TABS tablet Take 1 tablet (2.5 mg total) by mouth 2 (two) times daily.  [name] senna (SENOKOT) 8.6 MG TABS tablet Take 1 tablet (8.6 mg total) by mouth daily as needed for mild constipation.     Allergies:   Prednisone and Rosuvastatin   Social History   Tobacco Use  . Smoking status: Never Smoker  .  Smokeless tobacco: Never Used  Substance Use Topics  . Alcohol use: No  . Drug use: No     Family Hx: The patient's family history includes Cancer in his sister and sister.  ROS:   Please see the history of present illness.    Seasonal allergies All other systems reviewed and are negative.   Prior CV studies:   The following studies were reviewed today:  Echo  August 2019: Left ventricle: The cavity size was normal. Wall thickness was   increased in a pattern of moderate LVH. Systolic function was   moderately to severely reduced. The estimated ejection fraction   was in the range of 30% to 35%. Diffuse hypokinesis. The study is   not technically sufficient to allow evaluation of LV diastolic   function. - Aortic valve: Valve area (  VTI): 1.92 cm^2. Valve area (Vmax):   2.04 cm^2. - Mitral valve: There was mild regurgitation. - Left atrium: The atrium was mildly dilated. - Right ventricle: Poorly visualized. Grossly appears enlarged with   decreased function. - Right atrium: Poorly visualized. Grossly appears enlarged. - Atrial septum: No defect or patent foramen ovale was identified. - Pulmonary arteries: Systolic pressure was mildly increased. PA   peak pressure: 36 mm Hg (S). - LVEF is certaintly decreased from Jan 2018 study. Would recommend   limited study with echocontrast to more precisely evaluate   current current function.   Labs/Other Tests and Data Reviewed:    EKG:  An ECG dated 08/17/18 was personally reviewed today and demonstrated:  Atrial fibrillation at 80/m with IRBBB and poor r wave progression anteriorly  Recent Labs: 03/03/2018: B Natriuretic Peptide 526.6 04/10/2018: ALT 14; Hemoglobin 10.2; Platelets 131 10/06/2018: BUN 44; Creatinine, Ser 2.22; Potassium 4.2; Sodium 142   Recent Lipid Panel Lab Results  Component Value Date/Time   CHOL 104 03/04/2018 12:48 AM   CHOL 117 08/09/2017 08:40 AM   TRIG 22 03/04/2018 12:48 AM   HDL 33 (L) 03/04/2018  12:48 AM   HDL 41 08/09/2017 08:40 AM   CHOLHDL 3.2 03/04/2018 12:48 AM   LDLCALC 67 03/04/2018 12:48 AM   LDLCALC 65 08/09/2017 08:40 AM    Wt Readings from Last 3 Encounters:  10/24/18 177 lb 3.2 oz (80.4 kg)  08/17/18 179 lb 6.4 oz (81.4 kg)  04/24/18 169 lb 6.4 oz (76.8 kg)     Objective:    Vital Signs:  BP 123/70   Pulse 75   Ht 5\' 10"  (1.778 m)   Wt 177 lb 3.2 oz (80.4 kg)   BMI 25.43 kg/m      ASSESSMENT & PLAN:    Chronic systolic and diastolic CHF EF 09-60% on echo 02/2018 with recent adjustments in lasix for weight gain and edema- edema stable today.May be getting extra salt in his diet and his daughter will do better.  We will arrange for home health to go to his home and draw blood work as his creatinine went up 10/06/18-2.22.  Continued to take extra Lasix and potassium as needed for weight gain or fluid buildup.  2 g sodium diet discussed in detail with daughter.  CADCAD S/P cath 2010 BMS x2 LAD and RCA was occluded.NSTEMI with Cath 02/2018 CTO RCA, patent LAD stents and severe stenosis OM branch treated with  DES. On Plavix statin and beta blocker. No angina  Persistent Afib on Xarelto and beta blocker  History of hypotension-hasn't tolerated ACEI/ARB  COVID-19 Education: The signs and symptoms of COVID-19 were discussed with the patient and how to seek care for testing (follow up with PCP or arrange E-visit).  The importance of social distancing was discussed today.  Time:   Today, I have spent 17 minutes with the patient and his daughter with telehealth technology discussing the above problems.     Medication Adjustments/Labs and Tests Ordered: Current medicines are reviewed at length with the patient today.  Concerns regarding medicines are outlined above.  Tests Ordered: No orders of the defined types were placed in this encounter.   Medication Changes: No orders of the defined types were placed in this encounter.   Disposition:  Follow up in 3  month(s) with myself or Dr. Angelena [name]  Signed, Ermalinda Barrios, PA-C  10/24/2018 12:04 PM    [place]

## 2018-10-24 ENCOUNTER — Encounter: Payer: Self-pay | Admitting: Physician Assistant

## 2018-10-24 ENCOUNTER — Other Ambulatory Visit: Payer: Self-pay

## 2018-10-24 ENCOUNTER — Telehealth (INDEPENDENT_AMBULATORY_CARE_PROVIDER_SITE_OTHER): Payer: Medicare Other | Admitting: Physician Assistant

## 2018-10-24 VITALS — BP 123/70 | HR 75 | Ht 70.0 in | Wt 177.2 lb

## 2018-10-24 DIAGNOSIS — I5043 Acute on chronic combined systolic (congestive) and diastolic (congestive) heart failure: Secondary | ICD-10-CM | POA: Diagnosis not present

## 2018-10-24 DIAGNOSIS — E782 Mixed hyperlipidemia: Secondary | ICD-10-CM

## 2018-10-24 DIAGNOSIS — I25709 Atherosclerosis of coronary artery bypass graft(s), unspecified, with unspecified angina pectoris: Secondary | ICD-10-CM

## 2018-10-24 DIAGNOSIS — I10 Essential (primary) hypertension: Secondary | ICD-10-CM

## 2018-10-24 DIAGNOSIS — I951 Orthostatic hypotension: Secondary | ICD-10-CM

## 2018-10-24 DIAGNOSIS — I4819 Other persistent atrial fibrillation: Secondary | ICD-10-CM

## 2018-10-24 NOTE — Patient Instructions (Addendum)
Medication Instructions:  Your physician recommends that you continue on your current medications as directed. Please refer to the Current Medication list given to you today.  If you need a refill on your cardiac medications before your next appointment, please call your pharmacy.   Lab work: We will have [place] draw your labs (BMET, BNP)  If you have labs (blood work) drawn today and your tests are completely normal, you will receive your results only by: [name] MyChart Message (if you have MyChart) OR . A paper copy in the mail If you have any lab test that is abnormal or we need to change your treatment, we will call you to review the results.  Testing/Procedures: None ordered  Follow-Up: . Follow up with Dr. Angelena Form in July  Any Other Special Instructions Will Be Listed Below (If Applicable). Two Gram Sodium Diet 2000 mg  What is Sodium? Sodium is a mineral found naturally in many foods. The most significant source of sodium in the diet is table salt, which is about 40% sodium.  Processed, convenience, and preserved foods also contain a large amount of sodium.  The body needs only 500 mg of sodium daily to function,  A normal diet provides more than enough sodium even if you do not use salt.  Why Limit Sodium? A build up of sodium in the body can cause thirst, increased blood pressure, shortness of breath, and water retention.  Decreasing sodium in the diet can reduce edema and risk of heart attack or stroke associated with high blood pressure.  Keep in mind that there are many other factors involved in these health problems.  Heredity, obesity, lack of exercise, cigarette smoking, stress and what you eat all play a role.  General Guidelines:  Do not add salt at the table or in cooking.  One teaspoon of salt contains over 2 grams of sodium.  Read food labels  Avoid processed and convenience foods  Ask your dietitian before eating any foods not dicussed in the menu  planning guidelines  Consult your physician if you wish to use a salt substitute or a sodium containing medication such as antacids.  Limit milk and milk products to 16 oz (2 cups) per day.  Shopping Hints:  READ LABELS!! "Dietetic" does not necessarily mean low sodium.  Salt and other sodium ingredients are often added to foods during processing.   Menu Planning Guidelines Food Group Choose More Often Avoid  Beverages (see also the milk group All fruit juices, low-sodium, salt-free vegetables juices, low-sodium carbonated beverages Regular vegetable or tomato juices, commercially softened water used for drinking or cooking  Breads and Cereals Enriched white, wheat, rye and pumpernickel bread, hard rolls and dinner rolls; muffins, cornbread and waffles; most dry cereals, cooked cereal without added salt; unsalted crackers and breadsticks; low sodium or homemade bread crumbs Bread, rolls and crackers with salted tops; quick breads; instant hot cereals; pancakes; commercial bread stuffing; self-rising flower and biscuit mixes; regular bread crumbs or cracker crumbs  Desserts and Sweets Desserts and sweets mad with mild should be within allowance Instant pudding mixes and cake mixes  Fats Butter or margarine; vegetable oils; unsalted salad dressings, regular salad dressings limited to 1 Tbs; light, sour and heavy cream Regular salad dressings containing bacon fat, bacon bits, and salt pork; snack dips made with instant soup mixes or processed cheese; salted nuts  Fruits Most fresh, frozen and canned fruits Fruits processed with salt or sodium-containing ingredient (some dried fruits are processed  with sodium sulfites        Vegetables Fresh, frozen vegetables and low- sodium canned vegetables Regular canned vegetables, sauerkraut, pickled vegetables, and others prepared in brine; frozen vegetables in sauces; vegetables seasoned with ham, bacon or salt pork  Condiments, Sauces, Miscellaneous   Salt substitute with physician's approval; pepper, herbs, spices; vinegar, lemon or lime juice; hot pepper sauce; garlic powder, onion powder, low sodium soy sauce (1 Tbs.); low sodium condiments (ketchup, chili sauce, mustard) in limited amounts (1 tsp.) fresh ground horseradish; unsalted tortilla chips, pretzels, potato chips, popcorn, salsa (1/4 cup) Any seasoning made with salt including garlic salt, celery salt, onion salt, and seasoned salt; sea salt, rock salt, kosher salt; meat tenderizers; monosodium glutamate; mustard, regular soy sauce, barbecue, sauce, chili sauce, teriyaki sauce, steak sauce, Worcestershire sauce, and most flavored vinegars; canned gravy and mixes; regular condiments; salted snack foods, olives, picles, relish, horseradish sauce, catsup   Food preparation: Try these seasonings Meats:    Pork Sage, onion Serve with applesauce  Chicken Poultry seasoning, thyme, parsley Serve with cranberry sauce  Lamb Curry powder, rosemary, garlic, thyme Serve with mint sauce or jelly  Veal Marjoram, basil Serve with current jelly, cranberry sauce  Beef Pepper, bay leaf Serve with dry mustard, unsalted chive butter  Fish [place] leaf, dill Serve with unsalted lemon butter, unsalted parsley butter  Vegetables:    Asparagus Lemon juice   Broccoli Lemon juice   Carrots Mustard dressing parsley, mint, nutmeg, glazed with unsalted butter and sugar   Green beans Marjoram, lemon juice, nutmeg,dill seed   Tomatoes Basil, marjoram, onion   Spice /blend for [employer]" 4 tsp ground thyme 1 tsp ground sage 3 tsp ground rosemary 4 tsp ground marjoram   Test your knowledge 1. A product that says "Salt Free" may still contain sodium. True or False 2. Garlic Powder and Hot Pepper Sauce an be used as alternative seasonings.True or False 3. Processed foods have more sodium than fresh foods.  True or False 4. Canned Vegetables have less sodium than froze True or False  WAYS TO DECREASE YOUR SODIUM  INTAKE 1. Avoid the use of added salt in cooking and at the table.  Table salt (and other prepared seasonings which contain salt) is probably one of the greatest sources of sodium in the diet.  Unsalted foods can gain flavor from the sweet, sour, and butter taste sensations of herbs and spices.  Instead of using salt for seasoning, try the following seasonings with the foods listed.  Remember: how you use them to enhance natural food flavors is limited only by your creativity... Allspice-Meat, fish, eggs, fruit, peas, red and yellow vegetables Almond Extract-Fruit baked goods Anise Seed-Sweet breads, fruit, carrots, beets, cottage cheese, cookies (tastes like licorice) Basil-Meat, fish, eggs, vegetables, rice, vegetables salads, soups, sauces Bay Leaf-Meat, fish, stews, poultry Burnet-Salad, vegetables (cucumber-like flavor) Caraway Seed-Bread, cookies, cottage cheese, meat, vegetables, cheese, rice Cardamon-Baked goods, fruit, soups Celery Powder or seed-Salads, salad dressings, sauces, meatloaf, soup, bread.Do not use  celery salt Chervil-Meats, salads, fish, eggs, vegetables, cottage cheese (parsley-like flavor) Chili Power-Meatloaf, chicken cheese, corn, eggplant, egg dishes Chives-Salads cottage cheese, egg dishes, soups, vegetables, sauces Cilantro-Salsa, casseroles Cinnamon-Baked goods, fruit, pork, lamb, chicken, carrots Cloves-Fruit, baked goods, fish, pot roast, green beans, beets, carrots Coriander-Pastry, cookies, meat, salads, cheese (lemon-orange flavor) Cumin-Meatloaf, fish,cheese, eggs, cabbage,fruit pie (caraway flavor) Curry Powder-Meat, fruit, eggs, fish, poultry, cottage cheese, vegetables Dill Seed-Meat, cottage cheese, poultry, vegetables, fish, salads, bread Fennel Seed-Bread, cookies, apples, pork,  eggs, fish, beets, cabbage, cheese, Licorice-like flavor Garlic-(buds or powder) Salads, meat, poultry, fish, bread, butter, vegetables, potatoes.Do not  use garlic  salt Ginger-Fruit, vegetables, baked goods, meat, fish, poultry Horseradish Root-Meet, vegetables, butter Lemon Juice or Extract-Vegetables, fruit, tea, baked goods, fish salads Mace-Baked goods fruit, vegetables, fish, poultry (taste like nutmeg) Maple Extract-Syrups Marjoram-Meat, chicken, fish, vegetables, breads, green salads (taste like Sage) Mint-Tea, lamb, sherbet, vegetables, desserts, carrots, cabbage Mustard, Dry or Seed-Cheese, eggs, meats, vegetables, poultry Nutmeg-Baked goods, fruit, chicken, eggs, vegetables, desserts Onion Powder-Meat, fish, poultry, vegetables, cheese, eggs, bread, rice salads (Do not use   Onion salt) Orange Extract-Desserts, baked goods Oregano-Pasta, eggs, cheese, onions, pork, lamb, fish, chicken, vegetables, green salads Paprika-Meat, fish, poultry, eggs, cheese, vegetables Parsley Flakes-Butter, vegetables, meat fish, poultry, eggs, bread, salads (certain forms may   Contain sodium Pepper-Meat fish, poultry, vegetables, eggs Peppermint Extract-Desserts, baked goods Poppy Seed-Eggs, bread, cheese, fruit dressings, baked goods, noodles, vegetables, cottage  [employer], poultry, meat, fish, cauliflower, turnips,eggs bread Saffron-Rice, bread, veal, chicken, fish, eggs Sage-Meat, fish, poultry, onions, eggplant, tomateos, pork, stews Savory-Eggs, salads, poultry, meat, rice, vegetables, soups, pork Tarragon-Meat, poultry, fish, eggs, butter, vegetables (licorice-like flavor)  Thyme-Meat, poultry, fish, eggs, vegetables, (clover-like flavor), sauces, soups Tumeric-Salads, butter, eggs, fish, rice, vegetables (saffron-like flavor) Vanilla Extract-Baked goods, candy Vinegar-Salads, vegetables, meat marinades Walnut Extract-baked goods, candy  2. Choose your Foods Wisely   The following is a list of foods to avoid which are high in sodium:  Meats-Avoid all smoked, canned, salt cured, dried and kosher meat and  fish as well as Anchovies   [employer] meats:Bologna, Liverwurst, Pastrami Canned meat or fish  Marinated herring Caviar    Pepperoni Corned Beef   Pizza Dried chipped beef  Salami Frozen breaded fish or meat Salt pork Frankfurters or hot dogs  Sardines Gefilte fish   Sausage Ham (boiled ham, Proscuitto Smoked butt    spiced ham)   Spam      TV Dinners Vegetables Canned vegetables (Regular) Relish Canned mushrooms  Sauerkraut Olives    Tomato juice Pickles  Bakery and Dessert Products Canned puddings  Cream pies Cheesecake   Decorated cakes Cookies  Beverages/Juices Tomato juice, regular  Gatorade   V-8 vegetable juice, regular  Breads and Cereals Biscuit mixes   Salted potato chips, corn chips, pretzels Bread stuffing mixes  Salted crackers and rolls Pancake and waffle mixes Self-rising flour  Seasonings Accent    Meat sauces Barbecue sauce  Meat tenderizer Catsup    Monosodium glutamate (MSG) Celery salt   Onion salt Chili sauce   Prepared mustard Garlic salt   Salt, seasoned salt, sea salt Gravy mixes   Soy sauce Horseradish   Steak sauce Ketchup   Tartar sauce Lite salt    Teriyaki sauce Marinade mixes   Worcestershire sauce  Others Baking powder   Cocoa and cocoa mixes Baking soda   Commercial casserole mixes Candy-caramels, chocolate  Dehydrated soups    Bars, fudge,nougats  Instant rice and pasta mixes Canned broth or soup  Maraschino cherries Cheese, aged and processed cheese and cheese spreads  Learning Assessment Quiz  Indicated T (for True) or F (for False) for each of the following statements:  1. _____ Fresh fruits and vegetables and unprocessed grains are generally low in sodium 2. _____ Water may contain a considerable amount of sodium, depending on the source 3. _____ You can always tell if a food is high in sodium by tasting it  4. _____ Certain laxatives my be high in sodium and should be avoided unless prescribed   by a  physician or pharmacist 5. _____ Salt substitutes may be used freely by anyone on a sodium restricted diet 6. _____ Sodium is present in table salt, food additives and as a natural component of   most foods 7. _____ Table salt is approximately 90% sodium 8. _____ Limiting sodium intake may help prevent excess fluid accumulation in the body 9. _____ On a sodium-restricted diet, seasonings such as bouillon soy sauce, and    cooking wine should be used in place of table salt 10. _____ On an ingredient list, a product which lists monosodium glutamate as the first   ingredient is an appropriate food to include on a low sodium diet  Circle the best answer(s) to the following statements (Hint: there may be more than one correct answer)  11. On a low-sodium diet, some acceptable snack items are:    A. Olives  F. Bean dip   K. Grapefruit juice    B. Salted Pretzels G. Commercial Popcorn   L. Canned peaches    C. Carrot Sticks  H. Bouillon   M. Unsalted nuts   D. [place] fries  I. Peanut butter crackers N. Salami   E. Sweet pickles J. Tomato Juice   O. Pizza  12.  Seasonings that may be used freely on a reduced - sodium diet include   A. Lemon wedges F.Monosodium glutamate K. Celery seed    B.Soysauce   G. Pepper   L. Mustard powder   C. Sea salt  H. Cooking wine  M. Onion flakes   D. Vinegar  E. Prepared horseradish N. Salsa   E. Sage   J. Worcestershire sauce  O. Chutney

## 2018-10-25 ENCOUNTER — Encounter: Payer: Self-pay | Admitting: Cardiovascular Disease

## 2018-10-26 ENCOUNTER — Ambulatory Visit: Payer: Medicare Other | Admitting: Cardiovascular Disease

## 2018-10-27 ENCOUNTER — Telehealth: Payer: Self-pay | Admitting: Physician Assistant

## 2018-10-27 NOTE — Telephone Encounter (Signed)
She evaulated him for [facility], she will see him 1x a week for 1 week and 2x's a week  for one week , and 1x a week for 1 week and collect labs as requested and took them to [facility].

## 2018-11-03 ENCOUNTER — Telehealth: Payer: Self-pay

## 2018-11-03 NOTE — Telephone Encounter (Signed)
Called and made patient's daughter aware that the BMET and BNP drawn by [facility] on 10/25/18 came back stable. Made her aware that Cr improved at 1.8. Instructed for patient to continue with current treatment plan.

## 2018-11-03 NOTE — Telephone Encounter (Signed)
-----   Message from Imogene Burn, PA-C sent at 11/02/2018  4:51 PM EDT ----- Crt down to 1.8 so is better. No changes. Other labs stable

## 2018-11-30 ENCOUNTER — Other Ambulatory Visit: Payer: Self-pay | Admitting: Family Medicine

## 2018-12-07 ENCOUNTER — Telehealth: Payer: Self-pay | Admitting: Cardiovascular Disease

## 2018-12-07 NOTE — Telephone Encounter (Signed)
Attempted to return call but there was no answer. Will try again at another time.

## 2018-12-07 NOTE — Telephone Encounter (Signed)
Daughter of patient called. She reported that he has patches on his left leg that has skin flaking off, and underneath there is fluid leaking out of it. It started with one patch, but now there is a second patch. IT started when he took off his compression sock.

## 2018-12-08 NOTE — Telephone Encounter (Signed)
Called and spoke to [name]. Made her aware of recommendations to take an extra Lasix and potassium for 3 days then back to regular dose to see if that helps then continue to take extra as needed. Reiterated 2 gm sodium diet. She states that she feels like he is starting to do better and will hold off on virtual visit at this time. She states that she will let us know if his Sx change or worsen.

## 2018-12-08 NOTE — Telephone Encounter (Signed)
Follow up:   Patient daughter returning your call back from yesterday.

## 2018-12-08 NOTE — Telephone Encounter (Signed)
Called and spoke to the patient's daughter (DPR on file) She states that the patient was having some increased swelling in his legs and his weight was up to 178 lbs. She states that she had been giving him lasix 20 mg QD and k-dur 20 mEq QD. She states that she gave him an extra 20 of lasix and k-dur yesterday and today and his weight is down to 176.4 and swelling has improved some. She states that when he was wearing the compression stockings that it rubbed a couple of places on his legs. She states that there was some fluid leaking out but she put neosporin on it and it has gotten better. She states that the patient does not have any SOB or any other Sx.

## 2018-12-08 NOTE — Telephone Encounter (Signed)
Patient can take an extras Lasix and potassium for 3 days then back to regular dose to see if that helps then continue to take extra as needed. Reiterate 2 gm sodium diet. Offer evisit if they would like. Thanks, Selinda Eon

## 2018-12-11 ENCOUNTER — Other Ambulatory Visit: Payer: Self-pay | Admitting: Family Medicine

## 2018-12-21 ENCOUNTER — Other Ambulatory Visit: Payer: Self-pay | Admitting: Family Medicine

## 2018-12-21 NOTE — Telephone Encounter (Signed)
Pt scheduled and daughter informed. Deseree Kennon Holter, CMA

## 2018-12-21 NOTE — Telephone Encounter (Signed)
Will refill but please contact patient to schedule virtual visit to follow up Thanks Leeanne Rio, MD

## 2019-01-04 ENCOUNTER — Other Ambulatory Visit: Payer: Self-pay | Admitting: Family Medicine

## 2019-01-08 ENCOUNTER — Telehealth (INDEPENDENT_AMBULATORY_CARE_PROVIDER_SITE_OTHER): Payer: Medicare Other | Admitting: Family Medicine

## 2019-01-08 ENCOUNTER — Other Ambulatory Visit: Payer: Self-pay

## 2019-01-08 DIAGNOSIS — E782 Mixed hyperlipidemia: Secondary | ICD-10-CM

## 2019-01-08 DIAGNOSIS — K219 Gastro-esophageal reflux disease without esophagitis: Secondary | ICD-10-CM

## 2019-01-08 DIAGNOSIS — J309 Allergic rhinitis, unspecified: Secondary | ICD-10-CM

## 2019-01-08 NOTE — Progress Notes (Signed)
[place] [place] Telemedicine Visit  Patient consented to have virtual visit. Method of visit: Telephone  Encounter participants: Patient: [name] - located at home Provider: Chrisandra Netters - located at office Others (if applicable): daughter [name]  Chief Complaint: medication refills  HPI:  GERD - taking pepcid 20mg  twice daily as well as protonix 40mg  twice daily. This regimen helps control his GERD. Needs protonix refilled.  Allergies - taking flonase as well as claritin. Sees ENT to get ears cleaned out. Allergies are doing well on this regimen.  Hyperlipidemia - taking pravastatin 20mg  daily. Current on lipids. Tolerating well. Needs refilled.  Follows regularly with cardiology who manage his hypertension, CHF, afib.  ROS: per HPI  Pertinent PMHx: history of CAD, hypertension, chronic afib, ITP, CHF, prior CVA, hyperlipidemia, PAD   Exam:  Respiratory: Patient speaking normally in full sentences without any respiratory distress evident.   Assessment/Plan:  GERD (gastroesophageal reflux disease) Doing well on both protonix and pepcid. Continue these medications.   Allergic rhinitis Doing well on flonase and claritin. Continue these medications.   Hyperlipidemia Current on lipids. Refill pravastatin.   Follow up in 3 months  Time spent during visit with patient: 14 minutes

## 2019-01-10 ENCOUNTER — Telehealth: Payer: Self-pay | Admitting: Cardiovascular Disease

## 2019-01-10 NOTE — Telephone Encounter (Signed)
I can see him either way they feel comfortable. I feel it is safe to do it in the office if they are willing. Thanks, chris

## 2019-01-10 NOTE — Telephone Encounter (Signed)
I spoke with pt's daughter.  She reports the following weights Today-178.4 lbs 6/30-177.0 6/29-176.6 6/28-177.O  Prior to this most weights are around 176.  When pt gets to 178 lbs he starts having shortness of breath when walking and increased swelling in left leg. In the past lasix and potassium have been doubled for 3 days when this happens.  Current dose is Lasix 20 mg and potassium 20 mg daily.   Pt had oozing from legs where scab formed in late May but this has improved.   Pt is currently scheduled for phone visit with Dr. Angelena Form on July 6,2020.  Daughter is hesitant about bringing pt into the office but realizes it may be best for in office visit.  I explained to pt's daughter the safety measures that were being done in the office.  She is asking if Dr. Angelena Form feels in office visit would be best.  Daughter aware blood work may also be needed and she would like to have done in our office if it is needed. Will send to Dr. Angelena Form for review/recommendations.

## 2019-01-10 NOTE — Telephone Encounter (Signed)
  Pt c/o swelling: STAT is pt has developed SOB within 24 hours  1) How much weight have you gained and in what time span? 3 pounds  2) If swelling, where is the swelling located? Legs, feet  3) Are you currently taking a fluid pill? yes  4) Are you currently SOB? No, just a little out of breath when being active  5) Do you have a log of your daily weights (if so, list)? yes  6) Have you gained 3 pounds in a day or 5 pounds in a week? Yes, 3 pounds  7) Have you traveled recently? No  Daughter says patients blood pressure is 109/66, 105/58, 101/59. Patient is experiencing fatigue

## 2019-01-10 NOTE — Telephone Encounter (Signed)
Pt had last visit with Ermalinda Barrios, PA.  Will send message to her to see if OK to increase lasix and potassium for a few days.

## 2019-01-10 NOTE — Telephone Encounter (Signed)
Yes he can double up on his lasix and potassium for 3 days then back to regular dose. 2 gm sodium diet. Thanks!!

## 2019-01-10 NOTE — Telephone Encounter (Signed)
    COVID-19 Pre-Screening Questions:  . In the past 7 to 10 days have you had a cough,  shortness of breath, headache, congestion, fever (100 or greater) body aches, chills, sore throat, or sudden loss of taste or sense of smell? no . Have you been around anyone with known Covid 19. no . Have you been around anyone who is awaiting Covid 19 test results in the past 7 to 10 days? no . Have you been around anyone who has been exposed to Covid 19, or has mentioned symptoms of Covid 19 within the past 7 to 10 days? no  If you have any concerns/questions about symptoms patients report during screening (either on the phone or at [facility]). Contact the provider seeing the patient or DOD for further guidance.  If neither are available contact a member of the leadership team.       I spoke with pt's daughter and gave her information from Dr. Angelena Form and Ermalinda Barrios, PA.  Daughter would like in office visit.  Daughter answered above screening questions for pt.  She is aware pt will need to wear mask to appointment.

## 2019-01-11 DIAGNOSIS — K219 Gastro-esophageal reflux disease without esophagitis: Secondary | ICD-10-CM

## 2019-01-11 DIAGNOSIS — J309 Allergic rhinitis, unspecified: Secondary | ICD-10-CM | POA: Insufficient documentation

## 2019-01-11 HISTORY — DX: Gastro-esophageal reflux disease without esophagitis: K21.9

## 2019-01-11 HISTORY — DX: Allergic rhinitis, unspecified: J30.9

## 2019-01-11 MED ORDER — FLUTICASONE PROPIONATE 50 MCG/ACT NA SUSP: 2.0000 | Freq: Every day | NASAL | 3 refills | Status: DC | PRN

## 2019-01-11 MED ORDER — PANTOPRAZOLE SODIUM 40 MG PO TBEC: 40.0000 mg | DELAYED_RELEASE_TABLET | Freq: Two times a day (BID) | ORAL | 1 refills | Status: DC

## 2019-01-11 MED ORDER — PRAVASTATIN SODIUM 20 MG PO TABS: 20.0000 mg | ORAL_TABLET | Freq: Every day | ORAL | 1 refills | Status: AC

## 2019-01-11 NOTE — Assessment & Plan Note (Signed)
Current on lipids. Refill pravastatin.

## 2019-01-11 NOTE — Assessment & Plan Note (Signed)
Doing well on flonase and claritin. Continue these medications.

## 2019-01-11 NOTE — Assessment & Plan Note (Signed)
Doing well on both protonix and pepcid. Continue these medications.

## 2019-01-15 ENCOUNTER — Ambulatory Visit (INDEPENDENT_AMBULATORY_CARE_PROVIDER_SITE_OTHER): Payer: Medicare Other | Admitting: Cardiovascular Disease

## 2019-01-15 ENCOUNTER — Encounter: Payer: Self-pay | Admitting: Cardiovascular Disease

## 2019-01-15 ENCOUNTER — Other Ambulatory Visit: Payer: Self-pay

## 2019-01-15 VITALS — BP 138/70 | HR 75 | Ht 70.0 in | Wt 178.8 lb

## 2019-01-15 DIAGNOSIS — I25709 Atherosclerosis of coronary artery bypass graft(s), unspecified, with unspecified angina pectoris: Secondary | ICD-10-CM | POA: Diagnosis not present

## 2019-01-15 DIAGNOSIS — E782 Mixed hyperlipidemia: Secondary | ICD-10-CM | POA: Diagnosis not present

## 2019-01-15 DIAGNOSIS — I5043 Acute on chronic combined systolic (congestive) and diastolic (congestive) heart failure: Secondary | ICD-10-CM

## 2019-01-15 DIAGNOSIS — I10 Essential (primary) hypertension: Secondary | ICD-10-CM

## 2019-01-15 DIAGNOSIS — I255 Ischemic cardiomyopathy: Secondary | ICD-10-CM

## 2019-01-15 MED ORDER — FUROSEMIDE 40 MG PO TABS: 40.0000 mg | ORAL_TABLET | Freq: Every day | ORAL | 11 refills | Status: DC

## 2019-01-15 NOTE — Progress Notes (Signed)
Chief Complaint  Patient presents with   Follow-up    CAD   History of Present Illness: 85 yo male with history of CAD, ischemic cardiomyopathy, chronic systolic CHF, hyperlipidemia, atrial fibrillation/flutter, DVT and CVA in 2018 who is here today for cardiac follow up. He is known to have CAD with first cardiac cath in 2010 at which time his LAD was occluded and two bare metal stents were placed in the mid LAD. Hid RCA was occluded then. He has atrial fibrillation/flutter and a DVT in 2015 and has been on Xarelto. He was seen in the ED in August and September 2016 for dizziness felt to be due to his muscle relaxer. Head CT and EKG were ok. Seen in ED October 2017 with swollen legs. Venous dopplers negative for DVT. Treated for cellulitis and given Lasix. EKG that day with atrial fib. His Xarelto which had been used for treatment of DVT was restarted at that time. He was admitted to [facility] November 2017 with pneumonia, hemoptysis and CHF. He was diuresed with IV Lasix. His Xarelto was stopped due to hemoptysis and anemia and he then had a stroke in January 2018. His Xarelto was restarted. Echo January 2018 with normal LVEF=55-60%, moderate MR. He was admitted to [facility] in August 2019 with a NSTEMI. Cardiac cath 03/06/18 with chronically occluded RCA, patent LAD stents and severe stenosis in the obtuse marginal branch treated with a drug eluting stent. Echo 03/06/18 with LVEF=30-35%, mild MR. He was discharged on ASA, Plavix and Xarelto with plans for stopping ASA at one month. Admitted 03/17/18 with acute GI bleeding. EGD showed gastritis but no active bleeding. He was started on Pepcid and Protonix. He was seen by Neurology due to dizziness. Head MRI, Head/Neck CTA and EEG with no acute issues. His ASA was stopped after one month. He has been maintained on Plavix and Xarelto with no further GI bleeding. Losartan has been held due to hypotension. He was seen in our office in February 2020 with c/o lower  extremity edema and pain left calf. We arranged lower ext venous dopplers on 08/16/18 that were negative for DVT. His edema resolved with Lasix.   He is here today for follow up. The patient denies any chest pain, palpitations, orthopnea, PND, dizziness, near syncope or syncope. He is dyspneic and has LE edema if his weight gets up to 178 pounds. He has been taking Lasix 20 mg per day and has taken up to 40 mg of Lasix for 3-4 day blocks as told by our office. He always has symptom improvement with 40 mg of Lasix. This has not been used every day due to renal insufficiency.    Primary Care Physician: Leeanne Rio, MD  Past Medical History:  Diagnosis Date   Coronary atherosclerosis of native coronary artery    a. Anterior STEMI 2009 s/p BMSx2 to mid & prox LAD and angioplasty to distal LAD. total RCA with collaterals, moderate Cx plaquing. a. EF 55-60% in 2013.   Elevated troponin    Gross hematuria    Gross hematuria 11/18/2008   Qualifier: Diagnosis of  By: Owens Shark, RN, BSN, [name]     Hemoptysis 05/16/2016   HTN (hypertension)    Myocardial infarct ([place]) 03/21/2008   NSVT (nonsustained ventricular tachycardia) ([place])    a. Per DC summary from time of STEMI 2009.   PAF (paroxysmal atrial fibrillation) ([facility])    Paroxysmal atrial flutter ([place])    a. Abnl EKG 01/2012 concerning for atrial  flutter, event monitor showed sinus bradycardia only and no pauses. Not felt to be a candidate for longterm anticoag due to hematuria.   Phlebitis and thrombophlebitis of superficial vessels of lower extremities    Pure hypercholesterolemia    a. Has not tolerated statins in the past.   Right leg DVT ([place])    a. Dx 01/2014.   Sinus bradycardia    a. By prior event monitor.   Sinus bradycardia    a. By prior event monitor.    Thrombocytopenia ([place])    Urinary retention 06/20/2011    Past Surgical History:  Procedure Laterality Date   CARPAL TUNNEL RELEASE     CORONARY STENT  INTERVENTION N/A 03/06/2018   Procedure: CORONARY STENT INTERVENTION;  Surgeon: Lorretta Harp, MD;  Location: [facility];  Service: Cardiovascular;  Laterality: N/A;   CORONARY STENT PLACEMENT  2009   ESOPHAGOGASTRODUODENOSCOPY (EGD) WITH PROPOFOL N/A 03/19/2018   Procedure: ESOPHAGOGASTRODUODENOSCOPY (EGD) WITH PROPOFOL;  Surgeon: Wonda Horner, MD;  Location: [facility] ENDOSCOPY;  Service: Endoscopy;  Laterality: N/A;   LEFT HEART CATH AND CORONARY ANGIOGRAPHY N/A 03/06/2018   Procedure: LEFT HEART CATH AND CORONARY ANGIOGRAPHY;  Surgeon: Lorretta Harp, MD;  Location: [facility];  Service: Cardiovascular;  Laterality: N/A;   REPLACEMENT TOTAL KNEE BILATERAL     Left 2014 (Dr. Eddie Dibbles); right 2012 (Dr. Redmond Pulling)   trigger finger surgery      Current Outpatient Medications  Medication Sig Dispense Refill   clopidogrel (PLAVIX) 75 MG tablet TAKE 1 TABLET (75 MG TOTAL) BY MOUTH DAILY WITH BREAKFAST. 90 tablet 3   famotidine (PEPCID) 20 MG tablet TAKE 1 TABLET BY MOUTH TWICE A DAY 60 tablet 5   fluticasone (FLONASE) 50 MCG/ACT nasal spray Place 2 sprays into both nostrils daily as needed for allergies or rhinitis. 16 g 3   furosemide (LASIX) 40 MG tablet Take 1 tablet (40 mg total) by mouth daily. 30 tablet 11   loratadine (CLARITIN) 10 MG tablet Take 10 mg by mouth daily as needed for allergies.      metoprolol tartrate (LOPRESSOR) 25 MG tablet TAKE 1/2 TABLET BY MOUTH TWICE A DAY 30 tablet 11   nitroGLYCERIN (NITROSTAT) 0.4 MG SL tablet Place 0.4 mg under the tongue every 5 (five) minutes as needed for chest pain.     pantoprazole (PROTONIX) 40 MG tablet Take 1 tablet (40 mg total) by mouth 2 (two) times daily. 180 tablet 1   potassium chloride SA (K-DUR) 20 MEQ tablet Take 20 mEq by mouth daily.     pravastatin (PRAVACHOL) 20 MG tablet Take 1 tablet (20 mg total) by mouth daily at 6 PM. 90 tablet 1   pregabalin (LYRICA) 75 MG capsule Take 1 capsule (75 mg total) by  mouth 2 (two) times daily. 60 capsule 0   rivaroxaban (XARELTO) 2.5 MG TABS tablet Take 1 tablet (2.5 mg total) by mouth 2 (two) times daily. 180 tablet 0   senna (SENOKOT) 8.6 MG TABS tablet Take 1 tablet (8.6 mg total) by mouth daily as needed for mild constipation. 30 each 0   No current facility-administered medications for this visit.     Allergies  Allergen Reactions   Prednisone Other (See Comments)    Does not feel right   Rosuvastatin Other (See Comments)    Aches    Social History   Socioeconomic History   Marital status: Married    Spouse name: Not on file   Number of  children: Not on file   Years of education: Not on file   Highest education level: Not on file  Occupational History   Not on file  Social Needs   Financial resource strain: Not on file   Food insecurity    Worry: Not on file    Inability: Not on file   Transportation needs    Medical: Not on file    Non-medical: Not on file  Tobacco Use   Smoking status: Never Smoker   Smokeless tobacco: Never Used  Substance and Sexual Activity   Alcohol use: No   Drug use: No   Sexual activity: Not on file  Lifestyle   Physical activity    Days per week: Not on file    Minutes per session: Not on file   Stress: Not on file  Relationships   Social connections    Talks on phone: Not on file    Gets together: Not on file    Attends religious service: Not on file    Active member of club or organization: Not on file    Attends meetings of clubs or organizations: Not on file    Relationship status: Not on file   Intimate partner violence    Fear of current or ex partner: Not on file    Emotionally abused: Not on file    Physically abused: Not on file    Forced sexual activity: Not on file  Other Topics Concern   Not on file  Social History Narrative   Lives in [place]. Wife has died. Lives next door to his daughter [name]. Also has a very supportive daughter, [name] who lives out  of town. 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 Denies tobacco, alcohol, or drug use ever in lifetime.    Family History  Problem Relation Age of Onset   Cancer Sister    Cancer Sister     Review of Systems:  As stated in the HPI and otherwise negative.   BP 138/70    Pulse 75    Ht 5\' 10"  (1.778 m)    Wt 178 lb 12.8 oz (81.1 kg)    SpO2 95%    BMI 25.66 kg/m   Physical Examination:  General: Well developed, well nourished, NAD  HEENT: OP clear, mucus membranes moist  SKIN: warm, dry. No rashes. Neuro: No focal deficits  Musculoskeletal: Muscle strength 5/5 all ext  Psychiatric: Mood and affect normal  Neck: No JVD, no carotid bruits, no thyromegaly, no lymphadenopathy.  Lungs:Clear bilaterally, no wheezes, rhonci, crackles Cardiovascular: Irreg irreg.  No murmurs, gallops or rubs. Abdomen:Soft. Bowel sounds present. Non-tender.  Extremities: No lower extremity edema. Pulses are 2 + in the bilateral DP/PT.   Echo  August 2019: Left ventricle: The cavity size was normal. Wall thickness was   increased in a pattern of moderate LVH. Systolic function was   moderately to severely reduced. The estimated ejection fraction   was in the range of 30% to 35%. Diffuse hypokinesis. The study is   not technically sufficient to allow evaluation of LV diastolic   function. - Aortic valve: Valve area (VTI): 1.92 cm^2. Valve area (Vmax):   2.04 cm^2. - Mitral valve: There was mild regurgitation. - Left atrium: The atrium was mildly dilated. - Right ventricle: Poorly visualized. Grossly appears enlarged with   decreased function. - Right atrium: Poorly visualized. Grossly appears enlarged. - Atrial septum: No defect or patent foramen ovale was identified. - Pulmonary  arteries: Systolic pressure was mildly increased. PA   peak pressure: 36 mm Hg (S). - LVEF is certaintly decreased from Jan 2018 study. Would recommend   limited study with echocontrast to more  precisely evaluate   current current function.  EKG:  EKG is not ordered today. The ekg ordered today demonstrates    Recent Labs: 03/03/2018: B Natriuretic Peptide 526.6 04/10/2018: ALT 14; Hemoglobin 10.2; Platelets 131 10/06/2018: BUN 44; Creatinine, Ser 2.22; Potassium 4.2; Sodium 142   Lipid Panel     Component Value Date/Time   CHOL 104 03/04/2018 0048   CHOL 117 08/09/2017 0840   TRIG 22 03/04/2018 0048   HDL 33 (L) 03/04/2018 0048   HDL 41 08/09/2017 0840   CHOLHDL 3.2 03/04/2018 0048   VLDL 4 03/04/2018 0048   LDLCALC 67 03/04/2018 0048   LDLCALC 65 08/09/2017 0840      Wt Readings from Last 3 Encounters:  01/15/19 178 lb 12.8 oz (81.1 kg)  10/24/18 177 lb 3.2 oz (80.4 kg)  08/17/18 179 lb 6.4 oz (81.4 kg)     Other studies Reviewed: Additional studies/ records that were reviewed today include: . Review of the above records demonstrates:    Assessment and Plan:   1. CAD without angina: No chest pain. He is not on ASA since he is on Xarelto. Will continue Plavix, beta blocker and statin. .   2. Atrial fibrillation, persistent: He is in atrial fib today. Rate controlled. Continue Xarelto and beta blocker.      3. Hyperlipidemia: Continue statin.   4. Acute on chronic combined systolic and diastolic CHF/Ischemic cardiomyopathy: LVEF=30-35% by echo August 2019. He has since has revascularization of the obtuse marginal. There is chronic occlusion of the RCA dating back to 2010. He has not tolerated Ace-inh or ARB due to hypotension. He has been on a beta blocker. He has had recent volume overload that responds to increased doses of lasix. Will change Lasix to 40 mg daily. BMET today. Follow daily weights. Salt and fluid restriction. Will continue beta blocker.   Current medicines are reviewed at length with the patient today.  The patient does not have concerns regarding medicines.  The following changes have been made:  no change  Labs/ tests ordered today  include:   Orders Placed This Encounter  Procedures   Basic metabolic panel    Disposition:   FU with me in 3 months.   Signed, Lauree Chandler, MD 01/15/2019 3:04 PM    [facility] [address], [place], [place]  [address] Phone: [phone]; Fax: [phone]

## 2019-01-15 NOTE — Patient Instructions (Signed)
Medication Instructions:  1) INCREASE LASIX to 40 mg daily  Labwork: TODAY: BMET  Testing/Procedures: None  Follow-Up: Your provider recommends that you schedule a follow-up appointment in 3 months with Dr. Angelena Form. You will be called to arrange this appointment when the schedule is finalized.

## 2019-01-16 ENCOUNTER — Telehealth: Payer: Self-pay

## 2019-01-16 DIAGNOSIS — I5022 Chronic systolic (congestive) heart failure: Secondary | ICD-10-CM

## 2019-01-16 LAB — BASIC METABOLIC PANEL
BUN/Creatinine Ratio: 17 (ref 10–24)
BUN: 32 mg/dL (ref 10–36)
CO2: 24 mmol/L (ref 20–29)
Calcium: 9.1 mg/dL (ref 8.6–10.2)
Chloride: 107 mmol/L — ABNORMAL HIGH (ref 96–106)
Creatinine, Ser: 1.92 mg/dL — ABNORMAL HIGH (ref 0.76–1.27)
GFR calc Af Amer: 34 mL/min/{1.73_m2} — ABNORMAL LOW (ref >59)
GFR calc non Af Amer: 30 mL/min/{1.73_m2} — ABNORMAL LOW (ref >59)
Glucose: 146 mg/dL — ABNORMAL HIGH (ref 65–99)
Potassium: 4.2 mmol/L (ref 3.5–5.2)
Sodium: 145 mmol/L — ABNORMAL HIGH (ref 134–144)

## 2019-01-16 NOTE — Telephone Encounter (Signed)
-----   Message from Burnell Blanks, MD sent at 01/16/2019 11:21 AM EDT ----- Can we let him know that his renal function is stable and he should continue the current dose of lasix. I would like another BMET in 10 days.  Thanks, chris

## 2019-01-16 NOTE — Telephone Encounter (Signed)
[name] (DPR on file) has been notified of the result and recommendations. She verbalized understanding.  BMET scheduled for 7/17. She will come up with him for labs. All questions (if any) were answered.

## 2019-01-26 ENCOUNTER — Other Ambulatory Visit: Payer: Medicare Other | Admitting: *Deleted

## 2019-01-26 ENCOUNTER — Other Ambulatory Visit: Payer: Self-pay

## 2019-01-26 DIAGNOSIS — I5022 Chronic systolic (congestive) heart failure: Secondary | ICD-10-CM

## 2019-01-27 LAB — BASIC METABOLIC PANEL
BUN/Creatinine Ratio: 16 (ref 10–24)
BUN: 32 mg/dL (ref 10–36)
CO2: 24 mmol/L (ref 20–29)
Calcium: 9.4 mg/dL (ref 8.6–10.2)
Chloride: 101 mmol/L (ref 96–106)
Creatinine, Ser: 1.98 mg/dL — ABNORMAL HIGH (ref 0.76–1.27)
GFR calc Af Amer: 33 mL/min/{1.73_m2} — ABNORMAL LOW (ref >59)
GFR calc non Af Amer: 28 mL/min/{1.73_m2} — ABNORMAL LOW (ref >59)
Glucose: 157 mg/dL — ABNORMAL HIGH (ref 65–99)
Potassium: 4 mmol/L (ref 3.5–5.2)
Sodium: 143 mmol/L (ref 134–144)

## 2019-03-16 ENCOUNTER — Emergency Department (HOSPITAL_COMMUNITY)
Admission: EM | Admit: 2019-03-16 | Discharge: 2019-03-17 | Disposition: A | Discharge status: Home / Self Care | Payer: Medicare Other | Attending: Emergency Medicine | Admitting: Emergency Medicine

## 2019-03-16 ENCOUNTER — Emergency Department (HOSPITAL_COMMUNITY): Payer: Medicare Other

## 2019-03-16 ENCOUNTER — Other Ambulatory Visit: Payer: Self-pay

## 2019-03-16 ENCOUNTER — Encounter (HOSPITAL_COMMUNITY): Payer: Self-pay | Admitting: Emergency Medicine

## 2019-03-16 DIAGNOSIS — I110 Hypertensive heart disease with heart failure: Secondary | ICD-10-CM | POA: Insufficient documentation

## 2019-03-16 DIAGNOSIS — Z7902 Long term (current) use of antithrombotics/antiplatelets: Secondary | ICD-10-CM | POA: Insufficient documentation

## 2019-03-16 DIAGNOSIS — W01198A Fall on same level from slipping, tripping and stumbling with subsequent striking against other object, initial encounter: Secondary | ICD-10-CM | POA: Diagnosis not present

## 2019-03-16 DIAGNOSIS — S0990XA Unspecified injury of head, initial encounter: Secondary | ICD-10-CM | POA: Diagnosis not present

## 2019-03-16 DIAGNOSIS — S61412A Laceration without foreign body of left hand, initial encounter: Secondary | ICD-10-CM | POA: Diagnosis not present

## 2019-03-16 DIAGNOSIS — Y999 Unspecified external cause status: Secondary | ICD-10-CM | POA: Insufficient documentation

## 2019-03-16 DIAGNOSIS — M25562 Pain in left knee: Secondary | ICD-10-CM | POA: Insufficient documentation

## 2019-03-16 DIAGNOSIS — Y929 Unspecified place or not applicable: Secondary | ICD-10-CM | POA: Insufficient documentation

## 2019-03-16 DIAGNOSIS — M25561 Pain in right knee: Secondary | ICD-10-CM | POA: Insufficient documentation

## 2019-03-16 DIAGNOSIS — Z23 Encounter for immunization: Secondary | ICD-10-CM | POA: Insufficient documentation

## 2019-03-16 DIAGNOSIS — Y939 Activity, unspecified: Secondary | ICD-10-CM | POA: Insufficient documentation

## 2019-03-16 DIAGNOSIS — I2510 Atherosclerotic heart disease of native coronary artery without angina pectoris: Secondary | ICD-10-CM | POA: Insufficient documentation

## 2019-03-16 DIAGNOSIS — S61411A Laceration without foreign body of right hand, initial encounter: Secondary | ICD-10-CM | POA: Diagnosis not present

## 2019-03-16 DIAGNOSIS — Z96653 Presence of artificial knee joint, bilateral: Secondary | ICD-10-CM | POA: Diagnosis not present

## 2019-03-16 DIAGNOSIS — I5032 Chronic diastolic (congestive) heart failure: Secondary | ICD-10-CM | POA: Insufficient documentation

## 2019-03-16 DIAGNOSIS — Z79899 Other long term (current) drug therapy: Secondary | ICD-10-CM | POA: Diagnosis not present

## 2019-03-16 DIAGNOSIS — Z955 Presence of coronary angioplasty implant and graft: Secondary | ICD-10-CM | POA: Diagnosis not present

## 2019-03-16 DIAGNOSIS — W19XXXA Unspecified fall, initial encounter: Secondary | ICD-10-CM

## 2019-03-16 DIAGNOSIS — Z7901 Long term (current) use of anticoagulants: Secondary | ICD-10-CM | POA: Diagnosis not present

## 2019-03-16 NOTE — ED Notes (Signed)
Daughter please call [phone]

## 2019-03-16 NOTE — ED Triage Notes (Signed)
Patient slipped and fell on wet floor this afternoon , denies LOC / ambulatory , presents with left proximal forearm swelling , seen at an urgent care this evening dressings applied at bilateral hands skin tears .

## 2019-03-17 ENCOUNTER — Emergency Department (HOSPITAL_COMMUNITY): Payer: Medicare Other

## 2019-03-17 MED ORDER — TETANUS-DIPHTH-ACELL PERTUSSIS 5-2.5-18.5 LF-MCG/0.5 IM SUSP
0.5000 mL | Freq: Once | INTRAMUSCULAR | Status: AC
  Administered 2019-03-17: 0.5 mL via INTRAMUSCULAR
  Filled 2019-03-17: qty 0.5

## 2019-03-17 NOTE — Discharge Instructions (Addendum)
1. Medications: usual home medications 2. Treatment: rest, drink plenty of fluids, keep wounds clean with warm soap and water.  Do not remove steri strips for 3-5 days. Afterwards, apply neosporin to skin tears 3. Follow Up: Please followup with your primary doctor in 3 days for wound check; Please return to the ER for headaches, vision changes, vomiting, increasing pain, signs of infection or other concerns

## 2019-03-17 NOTE — ED Notes (Signed)
ED Provider at bedside. 

## 2019-03-17 NOTE — ED Provider Notes (Addendum)
[facility] EMERGENCY DEPARTMENT Provider Note   CSN: [id number] Arrival date & time: 03/16/19  2045     History   Chief Complaint Chief Complaint  Patient presents with   Fall    HPI [name] is a 85 y.o. male with a hx of CAD, hypertension, MI, paroxysmal A. Fib, thrombocytopenia, chronic anticoagulation (both Plavix and Xarelto) presents to the Emergency Department complaining of acute, persistent skin tears to the bilateral hands, bilateral knee pain and arm pain after fall onset around 3 PM.  Patient reports he spilled a glass of water on the floor and slipped, falling into the trash can.  He reports he did hit his head but has not had a loss of consciousness.  Daughter at bedside reports patient was covered in blood when she arrived.  She is unsure where all of his skin tears came from.  She reports he has been alert and oriented throughout his time with her.  He has been ambulatory with some pain but without difficulty.  He is not more off balance than usual and does normally use a cane to walk.  Patient was initially taken to urgent care in [place], wounds are wrapped and patient referred to the emergency department.  No other treatments prior to arrival..        The history is provided by the patient, a relative and medical records. No language interpreter was used.    Past Medical History:  Diagnosis Date   Coronary atherosclerosis of native coronary artery    a. Anterior STEMI 2009 s/p BMSx2 to mid & prox LAD and angioplasty to distal LAD. total RCA with collaterals, moderate Cx plaquing. a. EF 55-60% in 2013.   Elevated troponin    Gross hematuria    Gross hematuria 11/18/2008   Qualifier: Diagnosis of  By: Owens Shark, RN, BSN, [name]     Hemoptysis 05/16/2016   HTN (hypertension)    Myocardial infarct ([place]) 03/21/2008   NSVT (nonsustained ventricular tachycardia) ([place])    a. Per DC summary from time of STEMI 2009.   PAF (paroxysmal  atrial fibrillation) (HCC)    Paroxysmal atrial flutter ([place])    a. Abnl EKG 01/2012 concerning for atrial flutter, event monitor showed sinus bradycardia only and no pauses. Not felt to be a candidate for longterm anticoag due to hematuria.   Phlebitis and thrombophlebitis of superficial vessels of lower extremities    Pure hypercholesterolemia    a. Has not tolerated statins in the past.   Right leg DVT ([place])    a. Dx 01/2014.   Sinus bradycardia    a. By prior event monitor.   Sinus bradycardia    a. By prior event monitor.    Thrombocytopenia ([place])    Urinary retention 06/20/2011    Patient Active Problem List   Diagnosis Date Noted   GERD (gastroesophageal reflux disease) 01/11/2019   Allergic rhinitis 01/11/2019   GI bleed 03/17/2018   Persistent atrial fibrillation    Orthostatic hypotension 11/26/2016   PAD (peripheral artery disease) ([place]) 08/13/2016   Cerebrovascular accident (CVA) ([place]) 08/09/2016   Abnormal CT of the chest 07/21/2016   NSTEMI (non-ST elevated myocardial infarction) ([place]) 05/16/2016   Chronic atrial fibrillation 05/16/2016   Chronic ITP (idiopathic thrombocytopenia) (HCC)    CHF (congestive heart failure), NYHA class II, chronic, diastolic (HCC)    Microscopic hematuria 09/28/2014   Coronary atherosclerosis of native coronary artery    Paroxysmal atrial flutter (HCC)    HTN (  hypertension)    Hyperlipidemia 04/23/2013   Atrial arrhythmia 04/23/2013   Benign prostate hyperplasia 10/04/2011   Bladder neck obstruction 10/04/2011   Enlarged prostate with lower urinary tract symptoms (LUTS) 06/09/2011   Congestive heart failure with left ventricular diastolic dysfunction ([place]) 05/25/2011   Coronary artery disease involving coronary bypass graft with angina pectoris ([place]) 05/25/2011   Degenerative disk disease 12/20/2010    Past Surgical History:  Procedure Laterality Date   CARPAL TUNNEL RELEASE     CORONARY STENT  INTERVENTION N/A 03/06/2018   Procedure: CORONARY STENT INTERVENTION;  Surgeon: Lorretta Harp, MD;  Location: [facility];  Service: Cardiovascular;  Laterality: N/A;   CORONARY STENT PLACEMENT  2009   ESOPHAGOGASTRODUODENOSCOPY (EGD) WITH PROPOFOL N/A 03/19/2018   Procedure: ESOPHAGOGASTRODUODENOSCOPY (EGD) WITH PROPOFOL;  Surgeon: Wonda Horner, MD;  Location: [facility] ENDOSCOPY;  Service: Endoscopy;  Laterality: N/A;   LEFT HEART CATH AND CORONARY ANGIOGRAPHY N/A 03/06/2018   Procedure: LEFT HEART CATH AND CORONARY ANGIOGRAPHY;  Surgeon: Lorretta Harp, MD;  Location: [facility];  Service: Cardiovascular;  Laterality: N/A;   REPLACEMENT TOTAL KNEE BILATERAL     Left 2014 (Dr. Eddie Dibbles); right 2012 (Dr. Redmond Pulling)   trigger finger surgery          Home Medications    Prior to Admission medications   Medication Sig Start Date End Date Taking? Authorizing Provider  clopidogrel (PLAVIX) 75 MG tablet TAKE 1 TABLET (75 MG TOTAL) BY MOUTH DAILY WITH BREAKFAST. 08/29/18  Yes Burnell Blanks, MD  famotidine (PEPCID) 20 MG tablet TAKE 1 TABLET BY MOUTH TWICE A DAY Patient taking differently: Take 20 mg by mouth 2 (two) times daily.  01/05/19  Yes Leeanne Rio, MD  fluticasone [facility]) 50 MCG/ACT nasal spray Place 2 sprays into both nostrils daily as needed for allergies or rhinitis. 01/11/19  Yes Leeanne Rio, MD  furosemide (LASIX) 40 MG tablet Take 1 tablet (40 mg total) by mouth daily. 01/15/19 01/10/20 Yes Burnell Blanks, MD  loratadine (CLARITIN) 10 MG tablet Take 10 mg by mouth daily as needed for allergies.    Yes [provider]  metoprolol tartrate (LOPRESSOR) 25 MG tablet TAKE 1/2 TABLET BY MOUTH TWICE A DAY Patient taking differently: Take 12.5 mg by mouth 2 (two) times daily.  05/02/18  Yes Burnell Blanks, MD  nitroGLYCERIN (NITROSTAT) 0.4 MG SL tablet Place 0.4 mg under the tongue every 5 (five) minutes as needed for chest pain.    Yes [provider]  pantoprazole (PROTONIX) 40 MG tablet Take 1 tablet (40 mg total) by mouth 2 (two) times daily. 01/11/19  Yes Leeanne Rio, MD  potassium chloride SA (K-DUR) 20 MEQ tablet Take 20 mEq by mouth daily.   Yes [provider]  pravastatin (PRAVACHOL) 20 MG tablet Take 1 tablet (20 mg total) by mouth daily at 6 PM. 01/11/19  Yes Leeanne Rio, MD  pregabalin (LYRICA) 75 MG capsule Take 1 capsule (75 mg total) by mouth 2 (two) times daily. 05/18/16  Yes Asencion Partridge, MD  rivaroxaban (XARELTO) 2.5 MG TABS tablet Take 1 tablet (2.5 mg total) by mouth 2 (two) times daily. 10/10/18  Yes Burnell Blanks, MD  senna (SENOKOT) 8.6 MG TABS tablet Take 1 tablet (8.6 mg total) by mouth daily as needed for mild constipation. 04/14/18  Yes Nuala Alpha, DO    Family History Family History  Problem Relation Age of Onset   Cancer Sister  Cancer Sister     Social History Social History   Tobacco Use   Smoking status: Never Smoker   Smokeless tobacco: Never Used  Substance Use Topics   Alcohol use: No   Drug use: No     Allergies   Prednisone and Rosuvastatin   Review of Systems Review of Systems  Constitutional: Negative for appetite change, diaphoresis, fatigue, fever and unexpected weight change.  HENT: Negative for mouth sores.   Eyes: Negative for visual disturbance.  Respiratory: Negative for cough, chest tightness, shortness of breath and wheezing.   Cardiovascular: Negative for chest pain.  Gastrointestinal: Negative for abdominal pain, constipation, diarrhea, nausea and vomiting.  Endocrine: Negative for polydipsia, polyphagia and polyuria.  Genitourinary: Negative for dysuria, frequency, hematuria and urgency.  Musculoskeletal: Positive for arthralgias and joint swelling. Negative for back pain and neck stiffness.  Skin: Positive for wound. Negative for rash.  Allergic/Immunologic: Negative for immunocompromised state.   Neurological: Negative for syncope, light-headedness and headaches.  Hematological: Does not bruise/bleed easily.  Psychiatric/Behavioral: Negative for sleep disturbance. The patient is not nervous/anxious.      Physical Exam Updated Vital Signs BP 127/65    Pulse 81    Temp 97.9 F (36.6 C) (Oral)    Resp 16    SpO2 100%   Physical Exam Vitals signs and nursing note reviewed.  Constitutional:      General: He is not in acute distress.    Appearance: He is not diaphoretic.  HENT:     Head: Normocephalic.  Eyes:     General: No scleral icterus.    Conjunctiva/sclera: Conjunctivae normal.  Neck:     Musculoskeletal: Normal range of motion.  Cardiovascular:     Rate and Rhythm: Normal rate and regular rhythm.     Pulses: Normal pulses.          Radial pulses are 2+ on the right side and 2+ on the left side.  Pulmonary:     Effort: No tachypnea, accessory muscle usage, prolonged expiration, respiratory distress or retractions.     Breath sounds: No stridor.     Comments: Equal chest rise. No increased work of breathing. Chest:     Chest wall: Swelling present. No deformity or tenderness.       Comments: No flail segment.  No respiratory distress. Abdominal:     General: There is no distension.     Palpations: Abdomen is soft.     Tenderness: There is no abdominal tenderness. There is no guarding or rebound.  Musculoskeletal:     Right shoulder: Normal.     Left shoulder: Normal.     Right wrist: He exhibits swelling. He exhibits normal range of motion and no tenderness.     Right hip: Normal.     Left hip: Normal.     Right knee: He exhibits swelling, effusion and ecchymosis. He exhibits normal range of motion. Tenderness found.     Left knee: He exhibits swelling, effusion, ecchymosis and laceration ( Skin tear over the patella). He exhibits normal range of motion. Tenderness found.     Right ankle: Normal.     Left ankle: Normal.     Cervical back: Normal.      Thoracic back: Normal.     Lumbar back: Normal.       Arms:     Comments: Moves all extremities equally and without difficulty.  Skin:    General: Skin is warm and dry.     Capillary Refill:  Capillary refill takes less than 2 seconds.     Comments: Numerous skin tears to the bilateral hands  Neurological:     Mental Status: He is alert.     GCS: GCS eye subscore is 4. GCS verbal subscore is 5. GCS motor subscore is 6.     Comments: Speech is clear and goal oriented.  Psychiatric:        Mood and Affect: Mood normal.      ED Treatments / Results   Radiology Dg Chest 2 View  Result Date: 03/17/2019 CLINICAL DATA:  [place] on wet floor. Right chest pain and bruising. Initial encounter. EXAM: CHEST - 2 VIEW COMPARISON:  04/07/2018 FINDINGS: Stable mild cardiomegaly. No evidence of mediastinal widening or tracheal deviation. Patient is slightly rotated to the left. Both lungs remain clear. Mild hyperinflation again seen. No evidence of pneumothorax or hemothorax. IMPRESSION: Stable mild cardiomegaly and COPD. No active disease. Electronically Signed   By: Marlaine Hind M.D.   On: 03/17/2019 05:33   Dg Forearm Left  Result Date: 03/16/2019 CLINICAL DATA:  Fall, arm swelling EXAM: LEFT FOREARM - 2 VIEW COMPARISON:  None. FINDINGS: Soft tissue swelling along the posterior aspect of the proximal left forearm. No underlying bony abnormality. No fracture, subluxation or dislocation. IMPRESSION: No acute bony abnormality. Electronically Signed   By: Rolm Baptise M.D.   On: 03/16/2019 21:41   Dg Wrist Complete Right  Result Date: 03/17/2019 CLINICAL DATA:  Fall on wet floor. Right wrist injury with pain and swelling. Initial encounter. EXAM: RIGHT WRIST - COMPLETE 3+ VIEW COMPARISON:  None. FINDINGS: There is no evidence of acute fracture or dislocation. Generalized osteopenia is noted. Mild degenerative changes are seen involving the radiocarpal and distal radioulnar joints. Mild sclerosis and  irregularity of the articular surface of the lunate is seen, consistent with a vascular necrosis/Kienbock's disease. IMPRESSION: No acute findings. Kienbock's disease involving the lunate. Electronically Signed   By: Marlaine Hind M.D.   On: 03/17/2019 05:40   Ct Head Wo Contrast  Result Date: 03/17/2019 CLINICAL DATA:  [place] on wet floor today. Head and neck trauma. High clinical risk. Initial encounter. EXAM: CT HEAD WITHOUT CONTRAST CT CERVICAL SPINE WITHOUT CONTRAST TECHNIQUE: Multidetector CT imaging of the head and cervical spine was performed following the standard protocol without intravenous contrast. Multiplanar CT image reconstructions of the cervical spine were also generated. COMPARISON:  Head CT on 08/09/2016 FINDINGS: Brain: No evidence of acute infarction, hemorrhage, hydrocephalus, extra-axial collection, or mass lesion/mass effect. Old right parietal MCA distribution infarct is seen, which is new since 2018 exam. Encephalomalacia in inferior right frontal lobe and posterior left temporal lobe are stable. Ad old left occipital lobe infarct is also unchanged. Stable mild-to-moderate diffuse cerebral atrophy. Vascular:  No hyperdense vessel or other acute findings. Skull: No evidence of skull fracture or other significant osseous abnormality. Sinuses/Orbits:  No acute findings. Other: None. CT CERVICAL SPINE FINDINGS Alignment: Normal.  Mild levoscoliosis noted. Skull base and vertebrae: No acute fracture. No primary bone lesion or focal pathologic process. Soft tissues and spinal canal: No prevertebral fluid or swelling. No visible canal hematoma. Disc levels: Advanced degenerative disc disease and syndesmophyte formation is seen at all cervical levels. Ankylosis is seen from levels of C3-C5 and at C6-7. Diffuse bilateral facet DJD is also seen as well as atlantoaxial degenerative changes. Upper chest: No acute findings. Other: None. IMPRESSION: 1. No acute intracranial abnormality. Old bilateral  cerebral infarcts and encephalomalacia, as described above. 2.  No evidence of acute cervical spine fracture or subluxation. Advanced diffuse degenerative spondylosis, as described above. Electronically Signed   By: Marlaine Hind M.D.   On: 03/17/2019 05:31   Ct Cervical Spine Wo Contrast  Result Date: 03/17/2019 CLINICAL DATA:  [place] on wet floor today. Head and neck trauma. High clinical risk. Initial encounter. EXAM: CT HEAD WITHOUT CONTRAST CT CERVICAL SPINE WITHOUT CONTRAST TECHNIQUE: Multidetector CT imaging of the head and cervical spine was performed following the standard protocol without intravenous contrast. Multiplanar CT image reconstructions of the cervical spine were also generated. COMPARISON:  Head CT on 08/09/2016 FINDINGS: Brain: No evidence of acute infarction, hemorrhage, hydrocephalus, extra-axial collection, or mass lesion/mass effect. Old right parietal MCA distribution infarct is seen, which is new since 2018 exam. Encephalomalacia in inferior right frontal lobe and posterior left temporal lobe are stable. Ad old left occipital lobe infarct is also unchanged. Stable mild-to-moderate diffuse cerebral atrophy. Vascular:  No hyperdense vessel or other acute findings. Skull: No evidence of skull fracture or other significant osseous abnormality. Sinuses/Orbits:  No acute findings. Other: None. CT CERVICAL SPINE FINDINGS Alignment: Normal.  Mild levoscoliosis noted. Skull base and vertebrae: No acute fracture. No primary bone lesion or focal pathologic process. Soft tissues and spinal canal: No prevertebral fluid or swelling. No visible canal hematoma. Disc levels: Advanced degenerative disc disease and syndesmophyte formation is seen at all cervical levels. Ankylosis is seen from levels of C3-C5 and at C6-7. Diffuse bilateral facet DJD is also seen as well as atlantoaxial degenerative changes. Upper chest: No acute findings. Other: None. IMPRESSION: 1. No acute intracranial abnormality. Old  bilateral cerebral infarcts and encephalomalacia, as described above. 2. No evidence of acute cervical spine fracture or subluxation. Advanced diffuse degenerative spondylosis, as described above. Electronically Signed   By: Marlaine Hind M.D.   On: 03/17/2019 05:31   Dg Knee Complete 4 Views Left  Result Date: 03/17/2019 CLINICAL DATA:  [place] on wet floor. Left knee pain. Initial encounter. EXAM: LEFT KNEE - COMPLETE 4+ VIEW COMPARISON:  None. FINDINGS: No evidence of fracture, dislocation, or joint effusion. Total knee arthroplasty is seen. Diffuse osteopenia noted as well as peripheral vascular calcification. IMPRESSION: 1. No acute findings. 2. Osteopenia and peripheral vascular calcification noted. Electronically Signed   By: Marlaine Hind M.D.   On: 03/17/2019 05:35   Dg Knee Complete 4 Views Right  Result Date: 03/17/2019 CLINICAL DATA:  [place] on wet floor. Left knee pain. Initial encounter. EXAM: RIGHT KNEE - COMPLETE 4+ VIEW COMPARISON:  None. FINDINGS: No evidence of fracture, dislocation, or joint effusion. Left knee prosthetic device is seen in place, which also extends into the femoral and tibial diaphyses. Generalized osteopenia is noted. Peripheral vascular calcification also demonstrated. IMPRESSION: 1. No acute findings. 2. Prosthetic hardware intact. 3. Generalized osteopenia and peripheral vascular calcification. Electronically Signed   By: Marlaine Hind M.D.   On: 03/17/2019 05:37    Procedures .Marland KitchenLaceration Repair  Date/Time: 03/17/2019 7:48 AM Performed by: Abigail Butts, PA-C Authorized by: Abigail Butts, PA-C   Consent:    Consent obtained:  Verbal   Consent given by:  Patient   Risks discussed:  Infection, need for additional repair, pain, poor cosmetic result and poor wound healing   Alternatives discussed:  No treatment and delayed treatment Universal protocol:    Procedure explained and questions answered to patient or proxy's satisfaction: yes     Relevant  documents present and verified: yes     Test results available  and properly labeled: yes     Imaging studies available: yes     Required blood products, implants, devices, and special equipment available: yes     Site/side marked: yes     Immediately prior to procedure, a time out was called: yes     Patient identity confirmed:  Verbally with patient and arm band Laceration details:    Location:  Leg   Leg location:  L knee Repair type:    Repair type:  Simple Pre-procedure details:    Preparation:  Patient was prepped and draped in usual sterile fashion Treatment:    Area cleansed with:  Shur-Clens   Amount of cleaning:  Standard Skin repair:    Repair method:  Steri-Strips   Number of Steri-Strips:  2 Approximation:    Approximation:  Close Post-procedure details:    Dressing:  Non-adherent dressing   Patient tolerance of procedure:  Tolerated well, no immediate complications .Marland KitchenLaceration Repair  Date/Time: 03/17/2019 7:49 AM Performed by: Abigail Butts, PA-C Authorized by: Abigail Butts, PA-C   Consent:    Consent obtained:  Verbal   Consent given by:  Patient   Risks discussed:  Infection, need for additional repair, pain, poor cosmetic result and poor wound healing   Alternatives discussed:  No treatment and delayed treatment Universal protocol:    Procedure explained and questions answered to patient or proxy's satisfaction: yes     Relevant documents present and verified: yes     Test results available and properly labeled: yes     Imaging studies available: yes     Required blood products, implants, devices, and special equipment available: yes     Site/side marked: yes     Immediately prior to procedure, a time out was called: yes     Patient identity confirmed:  Verbally with patient and arm band Laceration details:    Location:  Hand   Hand location:  L hand, dorsum Repair type:    Repair type:  Simple Exploration:    Hemostasis achieved with:   Direct pressure Treatment:    Area cleansed with:  Shur-Clens   Amount of cleaning:  Standard Skin repair:    Repair method:  Steri-Strips   Number of Steri-Strips:  1 Approximation:    Approximation:  Close Post-procedure details:    Dressing:  Non-adherent dressing   Patient tolerance of procedure:  Tolerated well, no immediate complications .Marland KitchenLaceration Repair  Date/Time: 03/17/2019 7:50 AM Performed by: Abigail Butts, PA-C Authorized by: Abigail Butts, PA-C   Consent:    Consent obtained:  Verbal   Consent given by:  Patient   Risks discussed:  Infection, need for additional repair, pain, poor cosmetic result and poor wound healing   Alternatives discussed:  No treatment and delayed treatment Universal protocol:    Procedure explained and questions answered to patient or proxy's satisfaction: yes     Relevant documents present and verified: yes     Test results available and properly labeled: yes     Imaging studies available: yes     Required blood products, implants, devices, and special equipment available: yes     Site/side marked: yes     Immediately prior to procedure, a time out was called: yes     Patient identity confirmed:  Verbally with patient Laceration details:    Location:  Hand   Hand location:  R wrist Repair type:    Repair type:  Simple Exploration:    Hemostasis achieved with:  Direct pressure  Treatment:    Area cleansed with:  Shur-Clens   Amount of cleaning:  Standard Skin repair:    Repair method:  Steri-Strips   Number of Steri-Strips:  6 Approximation:    Approximation:  Loose Post-procedure details:    Dressing:  Non-adherent dressing   Patient tolerance of procedure:  Tolerated well, no immediate complications .Marland KitchenLaceration Repair  Date/Time: 03/17/2019 7:50 AM Performed by: Abigail Butts, PA-C Authorized by: Abigail Butts, PA-C   Consent:    Consent obtained:  Verbal   Consent given by:  Patient   Risks  discussed:  Infection, need for additional repair, pain, poor cosmetic result and poor wound healing   Alternatives discussed:  No treatment and delayed treatment Universal protocol:    Procedure explained and questions answered to patient or proxy's satisfaction: yes     Relevant documents present and verified: yes     Test results available and properly labeled: yes     Imaging studies available: yes     Required blood products, implants, devices, and special equipment available: yes     Site/side marked: yes     Immediately prior to procedure, a time out was called: yes     Patient identity confirmed:  Verbally with patient and arm band Laceration details:    Location:  Hand   Hand location:  R hand, dorsum Repair type:    Repair type:  Intermediate Exploration:    Hemostasis achieved with:  Direct pressure Treatment:    Area cleansed with:  Shur-Clens   Amount of cleaning:  Standard Skin repair:    Repair method:  Steri-Strips   Number of Steri-Strips:  4 Approximation:    Approximation:  Loose Post-procedure details:    Dressing:  Non-adherent dressing   Patient tolerance of procedure:  Tolerated well, no immediate complications   (including critical care time)  Medications Ordered in ED Medications  Tdap (BOOSTRIX) injection 0.5 mL (0.5 mLs Intramuscular Given 03/17/19 2725)     Initial Impression / Assessment and Plan / ED Course  I have reviewed the triage vital signs and the nursing notes.  Pertinent labs & imaging results that were available during my care of the patient were reviewed by me and considered in my medical decision making (see chart for details).         Patient presents after fall.  No loss of consciousness however he is anticoagulated on Xarelto and is taking an antiplatelet.  Will obtain CT head neck.  He has bruising to his chest, will obtain x-rays.  Patient also with the bruising and skin tears to the right wrist and bilateral knees.  X-rays  pending.  Patient without significant pain.  He does have good range of motion.  7:43 AM CT scans without acute abnormality.  No skull fracture, cervical fracture or extremity fractures.  Skin tears cleaned, Steri-Strips applied and wounds bandaged.  Tetanus updated.  Patient is alert and oriented.  He is ambulatory with his cane at baseline.  No difficulty ambulating here.  Knee sleeve was placed.  Patient is to have follow-up in 3 days for wound check with his primary care provider.  Discussed reasons to return immediately to the emergency department with patient and daughter.  Questions answered.  Final Clinical Impressions(s) / ED Diagnoses   Final diagnoses:  Fall, initial encounter  Skin tear of left hand without complication, initial encounter  Skin tear of right hand without complication, initial encounter  Acute pain of both knees  ED Discharge Orders    None       Miaa Latterell, Gwenlyn Perking 03/17/19 0745    Persis Graffius, Jarrett Soho, PA-C 03/17/19 [place], [place], MD 03/18/19 [phone]

## 2019-03-23 ENCOUNTER — Telehealth: Payer: Self-pay

## 2019-03-23 ENCOUNTER — Telehealth: Payer: Self-pay | Admitting: Cardiovascular Disease

## 2019-03-23 NOTE — Telephone Encounter (Signed)
   [facility] Pre-operative Risk Assessment    Request for surgical clearance:  1. What type of surgery is being performed? Lumbar spine interlaminar epidural steroids injection  2. When is this surgery scheduled? TBD   3. What type of clearance is required (medical clearance vs. Pharmacy clearance to hold med vs. Both)? Both  4. Are there any medications that need to be held prior to surgery and how long? Plavix and Xarelto for 5 days  5. Practice name and name of physician performing surgery? [facility]: Normajean Glasgow, MD.  6. What is your office phone number? [phone]   7.   What is your office fax number? [phone]  8.   Anesthesia type (None, local, MAC, general) ?  Unspecified    [name] 03/23/2019, 4:38 PM  _________________________________________________________________   (provider comments below)

## 2019-03-23 NOTE — Telephone Encounter (Signed)
New message     Calling to talk to the preop nurse again about Mr Suski

## 2019-03-23 NOTE — Telephone Encounter (Signed)
Spoke with receptionist and Dr Cipriano Mile office and she said Dr Mina Marble and his nurse were off today. I told her I was returning a call from Rosser at her office.  I informed her office has not received the request and for them to resend it. She said would give Dr Mina Marble and his nurse the message. Also told her if anyone has any questions to call the office back and speak with someone in the pre-op pool. She voiced understanding.

## 2019-03-23 NOTE — Telephone Encounter (Signed)
ollow Up:   Kim from Dr Leland Her office is checking on the status of the clearance that was sent over on 03-14-19. Please fax this asap to [phone] att: Maudie Mercury

## 2019-03-23 NOTE — Telephone Encounter (Signed)
See other phone note

## 2019-03-23 NOTE — Telephone Encounter (Signed)
Kim from Dr Leland Her office called back and wanted to know if we gave the patient clearance for holding his blood thinners. She stated patient is having an injection. I informed her that we have not received a request. I advised her to fax once over and it would go to the pharmacy pre-op pool for them to evaluate she wanted to know if this would be addressed Monday and I told her I was unsure. I have her the [facility] fax number and asked her to fax over the request asap. She voiced understanding and thanked me for returning her call.

## 2019-03-26 NOTE — Telephone Encounter (Signed)
Dr. Angelena Form pt in need of lumbar injection of steroids,  Last stent 03/06/18 can he be off plavix for 5 days prior, he is not on ASA, he is on xarelto and pharmacy is addressing.  Thanks.  Please send answer to pre-op

## 2019-03-26 NOTE — Telephone Encounter (Signed)
I think he can hold his Xarelto for 5 days before the procedure despite his risk with prior DVT and stroke.   Lauree Chandler

## 2019-03-26 NOTE — Telephone Encounter (Signed)
Pharm please address Xarelto thanks 

## 2019-03-26 NOTE — Telephone Encounter (Signed)
Patient with diagnosis of afib on Xarelto for anticoagulation.    Procedure:  Lumbar spine interlaminar epidural steroids injection Date of procedure: TBD  CHADS2-VASc score of  7 (CHF, HTN, AGE,stroke/tia x 2/DVT, CAD, AGE)  CrCl 27 ml/min  Office protocol would be to hold Xarelto 4-5 days (MD requesting 5) due to spinal procedure and reduced creatine clearance. However, patient is high risk off anticoagulation due to hx of DVT and stroke. Will route to MD for input.

## 2019-03-26 NOTE — Telephone Encounter (Signed)
   Primary Cardiologist: Lauree Chandler, MD  Chart reviewed as part of pre-operative protocol coverage. Patient was contacted 03/26/2019 in reference to pre-operative risk assessment for pending surgery as outlined below.  [name] was last seen on 01/15/19 by Dr. Angelena Form for ischemic cardiomyopathy with chronic systolic HF, atrial fib, flutter, and DVT in 2015 on xarelto, and CAD with stent in last stent 03/06/18. Since that day, [name] has done well without cardiac complaints.    Therefore, based on ACC/AHA guidelines, the patient would be at acceptable risk for the planned procedure without further cardiovascular testing.  His age 85 play a factor in risk as well. He may hold plavix and xarelto for 5 days prior to procedure and resume as soon as possible post procedure.    I will route this recommendation to the requesting party via Epic fax function and remove from pre-op pool.  Please call with questions.  Cecilie Kicks, NP 03/26/2019, 2:25 PM

## 2019-03-26 NOTE — Telephone Encounter (Signed)
He can hold his Plavix for the procedure.   Lauree Chandler

## 2019-03-28 ENCOUNTER — Telehealth: Payer: Self-pay | Admitting: Cardiovascular Disease

## 2019-03-28 NOTE — Telephone Encounter (Signed)
New Message   Pt c/o swelling: STAT is pt has developed SOB within 24 hours  1) How much weight have you gained and in what time span? unknown  2) If swelling, where is the swelling located? Left foot  3) Are you currently taking a fluid pill? yes  4) Are you currently SOB? No, but he easy when he moves around  5) Do you have a log of your daily weights (if so, list)? 179.2, 179.6, 179  6) Have you gained 3 pounds in a day or 5 pounds in a week? She said he has but could not tell me when   7) Have you traveled recently? No

## 2019-03-28 NOTE — Telephone Encounter (Signed)
I spoke with pt's daughter and gave her information from Dr. McAlhany 

## 2019-03-28 NOTE — Telephone Encounter (Signed)
I would have him take lasix 40 mg BID for 3 days then go back to 40 mg daily. Also double Kdur to 40 meq daily while taking increased dose of Lasix. Salt and fluid restriction. Elevate legs. Thanks, chris

## 2019-03-28 NOTE — Telephone Encounter (Signed)
   Call back staff: [name] Clearance was faxed 2 days ago by Serita Butcher, NP. [name] Please contact the surgeon's office to ensure it has been received. . This phone note will be removed from the preop pool.  Richardson Dopp, PA-C    03/28/2019 12:25 PM

## 2019-03-28 NOTE — Telephone Encounter (Signed)
I spoke with pt's daughter who reports weight gain over last 3-4 days as listed below. Pt began feeling short of breath when walking around the house on Monday.  Lasix was increased to 40 mg daily at office visit on July 6,2020. Daughter reports pt had been doing well on this dose until Monday. Will forward to Dr. Angelena Form for review/recommendations.

## 2019-04-16 ENCOUNTER — Telehealth: Payer: Self-pay | Admitting: Cardiovascular Disease

## 2019-04-16 NOTE — Telephone Encounter (Signed)
New Message:  [name] called and said pt's other daughter(Ann) will be coming in with him for his appointment on Thursday. She said pt needs assistant with his appointment.

## 2019-04-16 NOTE — Telephone Encounter (Signed)
appt notes updated to reflect pt's daughter will be accompanying.

## 2019-04-19 ENCOUNTER — Other Ambulatory Visit: Payer: Self-pay

## 2019-04-19 ENCOUNTER — Encounter: Payer: Self-pay | Admitting: Cardiovascular Disease

## 2019-04-19 ENCOUNTER — Ambulatory Visit: Payer: Medicare Other | Admitting: Cardiovascular Disease

## 2019-04-19 VITALS — BP 124/62 | HR 82 | Ht 71.6 in | Wt 173.0 lb

## 2019-04-19 DIAGNOSIS — I4819 Other persistent atrial fibrillation: Secondary | ICD-10-CM

## 2019-04-19 DIAGNOSIS — E785 Hyperlipidemia, unspecified: Secondary | ICD-10-CM

## 2019-04-19 DIAGNOSIS — I2510 Atherosclerotic heart disease of native coronary artery without angina pectoris: Secondary | ICD-10-CM | POA: Diagnosis not present

## 2019-04-19 DIAGNOSIS — I5022 Chronic systolic (congestive) heart failure: Secondary | ICD-10-CM | POA: Diagnosis not present

## 2019-04-19 NOTE — Progress Notes (Signed)
Chief Complaint  Patient presents with   Follow-up    chronic systolic CHF   History of Present Illness: 85 yo male with history of CAD, ischemic cardiomyopathy, chronic systolic CHF, hyperlipidemia, atrial fibrillation/flutter, DVT and CVA in 2018 who is here today for cardiac follow up. He is known to have CAD with first cardiac cath in 2010 at which time his LAD was occluded and two bare metal stents were placed in the mid LAD. His RCA was occluded then. He has atrial fibrillation/flutter and a DVT in 2015 and has been on Xarelto. He was seen in the ED in August and September 2016 for dizziness felt to be due to his muscle relaxer. Head CT and EKG were ok. Seen in ED October 2017 with swollen legs. Venous dopplers negative for DVT. Treated for cellulitis and given Lasix. EKG that day with atrial fib. His Xarelto which had been used for treatment of DVT was restarted at that time. He was admitted to [facility] November 2017 with pneumonia, hemoptysis and CHF. He was diuresed with IV Lasix. His Xarelto was stopped due to hemoptysis and anemia and he then had a stroke in January 2018. His Xarelto was restarted. Echo January 2018 with normal LVEF=55-60%, moderate MR. He was admitted to [facility] in August 2019 with a NSTEMI. Cardiac cath 03/06/18 with chronically occluded RCA, patent LAD stents and severe stenosis in the obtuse marginal branch treated with a drug eluting stent. Echo 03/06/18 with LVEF=30-35%, mild MR. He was discharged on ASA, Plavix and Xarelto with plans for stopping ASA at one month. Admitted 03/17/18 with acute GI bleeding. EGD showed gastritis but no active bleeding. He was started on Pepcid and Protonix. He was seen by Neurology due to dizziness. Head MRI, Head/Neck CTA and EEG with no acute issues. His ASA was stopped after one month. He has been maintained on Plavix and Xarelto with no further GI bleeding. Losartan has been held due to hypotension. He was seen in our office in February 2020 with  c/o lower extremity edema and left calf pain. We arranged lower ext venous dopplers on 08/16/18 that were negative for DVT. His edema resolved with Lasix.   He is here today for follow up. The patient denies any chest pain, dyspnea, palpitations, lower extremity edema, orthopnea, PND, dizziness, near syncope or syncope.   Primary Care Physician: Leeanne Rio, MD  Past Medical History:  Diagnosis Date   Coronary atherosclerosis of native coronary artery    a. Anterior STEMI 2009 s/p BMSx2 to mid & prox LAD and angioplasty to distal LAD. total RCA with collaterals, moderate Cx plaquing. a. EF 55-60% in 2013.   Elevated troponin    Gross hematuria    Gross hematuria 11/18/2008   Qualifier: Diagnosis of  By: Owens Shark, RN, BSN, [name]     Hemoptysis 05/16/2016   HTN (hypertension)    Myocardial infarct ([place]) 03/21/2008   NSVT (nonsustained ventricular tachycardia) ([place])    a. Per DC summary from time of STEMI 2009.   PAF (paroxysmal atrial fibrillation) ([facility])    Paroxysmal atrial flutter ([place])    a. Abnl EKG 01/2012 concerning for atrial flutter, event monitor showed sinus bradycardia only and no pauses. Not felt to be a candidate for longterm anticoag due to hematuria.   Phlebitis and thrombophlebitis of superficial vessels of lower extremities    Pure hypercholesterolemia    a. Has not tolerated statins in the past.   Right leg DVT ([place])  a. Dx 01/2014.   Sinus bradycardia    a. By prior event monitor.   Sinus bradycardia    a. By prior event monitor.    Thrombocytopenia ([place])    Urinary retention 06/20/2011    Past Surgical History:  Procedure Laterality Date   CARPAL TUNNEL RELEASE     CORONARY STENT INTERVENTION N/A 03/06/2018   Procedure: CORONARY STENT INTERVENTION;  Surgeon: Lorretta Harp, MD;  Location: [facility];  Service: Cardiovascular;  Laterality: N/A;   CORONARY STENT PLACEMENT  2009   ESOPHAGOGASTRODUODENOSCOPY (EGD) WITH PROPOFOL  N/A 03/19/2018   Procedure: ESOPHAGOGASTRODUODENOSCOPY (EGD) WITH PROPOFOL;  Surgeon: Wonda Horner, MD;  Location: [facility] ENDOSCOPY;  Service: Endoscopy;  Laterality: N/A;   LEFT HEART CATH AND CORONARY ANGIOGRAPHY N/A 03/06/2018   Procedure: LEFT HEART CATH AND CORONARY ANGIOGRAPHY;  Surgeon: Lorretta Harp, MD;  Location: [facility];  Service: Cardiovascular;  Laterality: N/A;   REPLACEMENT TOTAL KNEE BILATERAL     Left 2014 (Dr. Eddie Dibbles); right 2012 (Dr. Redmond Pulling)   trigger finger surgery      Current Outpatient Medications  Medication Sig Dispense Refill   clopidogrel (PLAVIX) 75 MG tablet TAKE 1 TABLET (75 MG TOTAL) BY MOUTH DAILY WITH BREAKFAST. 90 tablet 3   famotidine (PEPCID) 20 MG tablet TAKE 1 TABLET BY MOUTH TWICE A DAY (Patient taking differently: Take 20 mg by mouth 2 (two) times daily. ) 60 tablet 5   fluticasone (FLONASE) 50 MCG/ACT nasal spray Place 2 sprays into both nostrils daily as needed for allergies or rhinitis. 16 g 3   furosemide (LASIX) 40 MG tablet Take 1 tablet (40 mg total) by mouth daily. 30 tablet 11   loratadine (CLARITIN) 10 MG tablet Take 10 mg by mouth daily as needed for allergies.      metoprolol tartrate (LOPRESSOR) 25 MG tablet TAKE 1/2 TABLET BY MOUTH TWICE A DAY 30 tablet 11   nitroGLYCERIN (NITROSTAT) 0.4 MG SL tablet Place 0.4 mg under the tongue every 5 (five) minutes as needed for chest pain.     pantoprazole (PROTONIX) 40 MG tablet Take 1 tablet (40 mg total) by mouth 2 (two) times daily. 180 tablet 1   potassium chloride SA (K-DUR) 20 MEQ tablet Take 20 mEq by mouth daily.     pravastatin (PRAVACHOL) 20 MG tablet Take 1 tablet (20 mg total) by mouth daily at 6 PM. 90 tablet 1   pregabalin (LYRICA) 75 MG capsule Take 1 capsule (75 mg total) by mouth 2 (two) times daily. 60 capsule 0   rivaroxaban (XARELTO) 2.5 MG TABS tablet Take 1 tablet (2.5 mg total) by mouth 2 (two) times daily. 180 tablet 0   senna (SENOKOT) 8.6 MG TABS  tablet Take 1 tablet (8.6 mg total) by mouth daily as needed for mild constipation. 30 each 0   No current facility-administered medications for this visit.     Allergies  Allergen Reactions   Prednisone Other (See Comments)    Does not feel right   Rosuvastatin Other (See Comments)    Aches    Social History   Socioeconomic History   Marital status: Married    Spouse name: Not on file   Number of children: Not on file   Years of education: Not on file   Highest education level: Not on file  Occupational History   Not on file  Social Needs   Financial resource strain: Not on file   Food insecurity  Worry: Not on file    Inability: Not on file   Transportation needs    Medical: Not on file    Non-medical: Not on file  Tobacco Use   Smoking status: Never Smoker   Smokeless tobacco: Never Used  Substance and Sexual Activity   Alcohol use: No   Drug use: No   Sexual activity: Not on file  Lifestyle   Physical activity    Days per week: Not on file    Minutes per session: Not on file   Stress: Not on file  Relationships   Social connections    Talks on phone: Not on file    Gets together: Not on file    Attends religious service: Not on file    Active member of club or organization: Not on file    Attends meetings of clubs or organizations: Not on file    Relationship status: Not on file   Intimate partner violence    Fear of current or ex partner: Not on file    Emotionally abused: Not on file    Physically abused: Not on file    Forced sexual activity: Not on file  Other Topics Concern   Not on file  Social History Narrative   Lives in [place]. Wife has died. Lives next door to his daughter [name]. Also has a very supportive daughter, [name] who lives out of town. 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 Denies tobacco, alcohol, or drug use ever in lifetime.    Family History  Problem Relation Age of Onset   Cancer  Sister    Cancer Sister     Review of Systems:  As stated in the HPI and otherwise negative.   BP 124/62    Pulse 82    Ht 5' 11.6" (1.819 m)    Wt 173 lb (78.5 kg)    BMI 23.73 kg/m   Physical Examination:  General: Well developed, well nourished, NAD  HEENT: OP clear, mucus membranes moist  SKIN: warm, dry. No rashes. Neuro: No focal deficits  Musculoskeletal: Muscle strength 5/5 all ext  Psychiatric: Mood and affect normal  Neck: No JVD, no carotid bruits, no thyromegaly, no lymphadenopathy.  Lungs:Clear bilaterally, no wheezes, rhonci, crackles Cardiovascular: Irregular irregular. No murmurs, gallops or rubs. Abdomen:Soft. Bowel sounds present. Non-tender.  Extremities: No lower extremity edema. Pulses are 2 + in the bilateral DP/PT.  Echo  August 2019: Left ventricle: The cavity size was normal. Wall thickness was   increased in a pattern of moderate LVH. Systolic function was   moderately to severely reduced. The estimated ejection fraction   was in the range of 30% to 35%. Diffuse hypokinesis. The study is   not technically sufficient to allow evaluation of LV diastolic   function. - Aortic valve: Valve area (VTI): 1.92 cm^2. Valve area (Vmax):   2.04 cm^2. - Mitral valve: There was mild regurgitation. - Left atrium: The atrium was mildly dilated. - Right ventricle: Poorly visualized. Grossly appears enlarged with   decreased function. - Right atrium: Poorly visualized. Grossly appears enlarged. - Atrial septum: No defect or patent foramen ovale was identified. - Pulmonary arteries: Systolic pressure was mildly increased. PA   peak pressure: 36 mm Hg (S). - LVEF is certaintly decreased from Jan 2018 study. Would recommend   limited study with echocontrast to more precisely evaluate   current current function.  EKG:  EKG is not ordered today. The ekg ordered today demonstrates  Recent Labs: 01/26/2019: BUN 32; Creatinine, Ser 1.98; Potassium 4.0; Sodium 143    Lipid Panel     Component Value Date/Time   CHOL 104 03/04/2018 0048   CHOL 117 08/09/2017 0840   TRIG 22 03/04/2018 0048   HDL 33 (L) 03/04/2018 0048   HDL 41 08/09/2017 0840   CHOLHDL 3.2 03/04/2018 0048   VLDL 4 03/04/2018 0048   LDLCALC 67 03/04/2018 0048   LDLCALC 65 08/09/2017 0840      Wt Readings from Last 3 Encounters:  04/19/19 173 lb (78.5 kg)  01/15/19 178 lb 12.8 oz (81.1 kg)  10/24/18 177 lb 3.2 oz (80.4 kg)     Other studies Reviewed: Additional studies/ records that were reviewed today include: . Review of the above records demonstrates:    Assessment and Plan:   1. CAD without angina: He has no chest pain. He is not on ASA since he is on Plavix and Xarelto. Continue statin, beta blocker and Plavix.    2. Atrial fibrillation, persistent: Rate controlled atrial fib today. Continue beta blocker and Xarelto.       3. Hyperlipidemia: Lipids followed in primary care Continue statin  4. Acute on chronic combined systolic and diastolic CHF/Ischemic cardiomyopathy: LVEF=30-35% by echo August 2019. He has since has revascularization of the obtuse marginal. There is chronic occlusion of the RCA dating back to 2010. He has not tolerated Ace-inh or ARB due to hypotension. He has been on a beta blocker. Weight stable today. Lower extremity edema has resolved. Continue Lasix 40 mg daily and beta blocker.   Current medicines are reviewed at length with the patient today.  The patient does not have concerns regarding medicines.  The following changes have been made:  no change  Labs/ tests ordered today include:   No orders of the defined types were placed in this encounter.   Disposition:   FU with me in 6 months.   Signed, Lauree Chandler, MD 04/19/2019 9:13 AM    [facility] [address], [place], [place]  [address] Phone: [phone]; Fax: [phone]

## 2019-04-19 NOTE — Patient Instructions (Signed)
Medication Instructions:  No changes If you need a refill on your cardiac medications before your next appointment, please call your pharmacy.   Lab work: none If you have labs (blood work) drawn today and your tests are completely normal, you will receive your results only by: . MyChart Message (if you have MyChart) OR . A paper copy in the mail If you have any lab test that is abnormal or we need to change your treatment, we will call you to review the results.  Testing/Procedures: none  Follow-Up: At [facility], you and your health needs are our priority.  As part of our continuing mission to provide you with exceptional heart care, we have created designated Provider Care Teams.  These Care Teams include your primary Cardiologist (physician) and Advanced Practice Providers (APPs -  Physician Assistants and Nurse Practitioners) who all work together to provide you with the care you need, when you need it. You will need a follow up appointment in 6 months.  Please call our office 2 months in advance to schedule this appointment.  You may see Christopher McAlhany, MD or one of the following Advanced Practice Providers on your designated Care Team:   Brittainy Simmons, PA-C Dayna Dunn, PA-C . Michele Lenze, PA-C  Any Other Special Instructions Will Be Listed Below (If Applicable).    

## 2019-04-24 ENCOUNTER — Telehealth: Payer: Self-pay | Admitting: Cardiovascular Disease

## 2019-04-24 MED ORDER — APIXABAN 2.5 MG PO TABS: 2.5000 mg | ORAL_TABLET | Freq: Two times a day (BID) | ORAL | 1 refills | Status: DC

## 2019-04-24 NOTE — Telephone Encounter (Signed)
Spoke with patients daughter, [name]. Discussed the below information with her and advised that Dr. Angelena Form would like to stop the Xarelto 2.5mg  BID and start Eliquis 2.5 mg BID.  She did ask about if he should still be on the plavix. Advised that I was pretty sure he should continue plavix with his hx of stroke and multiple stents, but I would route message to Dr. Angelena Form to confirm.   Daughter is agreeable to switch to Elqiuis and thanked me for the call.

## 2019-04-24 NOTE — Telephone Encounter (Signed)
If you wanted to stick with Xarelto, the appropriate dose for his indication (afib/DVT) would be 15mg  daily. With his declining renal function, I would probably feel more comfortable (better safety data) using Eliquis in which case he would be in 2.5mg  BID.

## 2019-04-24 NOTE — Telephone Encounter (Signed)
New Message   *STAT* If patient is at the pharmacy, call can be transferred to refill team.   1. Which medications need to be refilled? (please list name of each medication and dose if known) rivaroxaban (XARELTO) 2.5 MG TABS tablet  2. Which pharmacy/location (including street and city if local pharmacy) is medication to be sent to? [id number] - [place], [place] - [address]  3. Do they need a 30 day or 90 day supply? 90 day

## 2019-04-24 NOTE — Telephone Encounter (Signed)
Scr 1.98 on 7/17 Last OV 04/19/2019 crcl 26.43 ml/min On Xarelto 2.5mg  BID  Has hx of DVT, afib, stroke, NSTEMI 02/2018 Also with hx of hemoptysis and GI bleed (on ASA, plavix, xarelto)  PCP changed Xarelto to 2.5mg  BID after GI bleed in 03/2018 noting it was a low dose for CVA prophylaxis.   PCP was prescribing, but looks like we sent in the last Rx. Calling for refill.   Patient is over a year out from PCI. It is really not an appropriate use of xarelto for CAD anymore.   Patient noted to be in afib at last visit 04/19/2019.   Dr. Angelena Form are you ok with this dosing?

## 2019-04-24 NOTE — Telephone Encounter (Signed)
I misread your first note. I saw the 2.5 mg BID and thought he was on Eliquis. I agree with switching him to Eliquis 2.5 mg po BID with the better safety profile. Thanks, Gerald Stabs

## 2019-04-24 NOTE — Telephone Encounter (Signed)
Hi, Would you recommend 5 mg BID for stroke prevention? [name]

## 2019-04-25 NOTE — Telephone Encounter (Signed)
Yes. I would like to continue his Plavix. Thanks for helping with this. Gerald Stabs

## 2019-04-25 NOTE — Telephone Encounter (Signed)
Advised daughter that patient should continue plavix and eliquis 2.5mg  BID

## 2019-04-25 NOTE — Telephone Encounter (Signed)
Called daughter and left VM to return call.

## 2019-04-29 ENCOUNTER — Telehealth: Payer: Self-pay | Admitting: Cardiology

## 2019-04-29 NOTE — Telephone Encounter (Signed)
Pt's daughter called - since starting eliquis her father had bleed in his eye.  Then he has 3 dime size blisters without lower ext edema. They look like have blood discussed with Dr. Sallyanne Kuster . Held eliquis tonight Sunday and will send message to Dr. Angelena Form  For further instructions.  Daughter will call at 1000 if not heard before then.  He will hold in AM the eliquis until he hears from office.   He also felt bloated after taking the eliquis and this goes away but returns with each pill.

## 2019-04-30 ENCOUNTER — Telehealth: Payer: Self-pay | Admitting: Cardiovascular Disease

## 2019-04-30 MED ORDER — RIVAROXABAN 15 MG PO TABS: 15.0000 mg | ORAL_TABLET | Freq: Every day | ORAL | 6 refills | Status: AC

## 2019-04-30 NOTE — Telephone Encounter (Signed)
I spoke with patient's daughter. See telephone encounter dated 04/29/19 for details.

## 2019-04-30 NOTE — Telephone Encounter (Signed)
New Message:   Daughter said she had to call the doctor on call yesterday. Pt  Started Eliquis on Friday and had a blood vessel that had burst in his right eye. Also in  his lower left leg  Had lesions with blood and liquid in ( he has a total of four). Daughter was instructed by doctor on call yesterday to call today and talk to Triage see if pt would need to be seen today.

## 2019-04-30 NOTE — Addendum Note (Signed)
Addended by: Rodman Key on: 04/30/2019 10:02 AM   Modules accepted: Orders

## 2019-04-30 NOTE — Telephone Encounter (Signed)
He had been on Xarelto 2.5 mg po BID. We changed him to Eliquis 2.5 mg po BID last week. Now having issues with Eliquis. Can we let him know that the dose of Xarelto he had been on before was not a high enough dose to prevent stroke so if we go back to Xarelto, would need to try 15 mg a day? I would be ok with him going back to Xarelto. Can we see if he is willing to stop Eliquis and try Xarelto 15 mg once daily? Thanks, chris

## 2019-04-30 NOTE — Telephone Encounter (Addendum)
Since yesterday pt now has another small lesion on leg but the leg lesions were red and burning yesterday.  Today they continue draining a thin serosanguineous small amount drainage.  Elevating when he can.   It is not red or hot to touch and feeling better today than yesterday. He is not bloated today.  His eye is starting to clear up and overall he is feeling better.   Pt has not had Eliquis since Sunday am and this evening will start Xarelto 15 mg daily. His daughter will continue to monitor for further s/s of bleeding.

## 2019-05-01 ENCOUNTER — Telehealth: Payer: Self-pay | Admitting: Cardiovascular Disease

## 2019-05-01 NOTE — Telephone Encounter (Signed)
Ok to use - would clarify if pt is still taking pregabalin as this would be duplication of therapy and pt shouldn't need both.

## 2019-05-01 NOTE — Telephone Encounter (Signed)
Pt c/o swelling: STAT is pt has developed SOB within 24 hours  1) How much weight have you gained and in what time span? 3 lbs in one day  2) If swelling, where is the swelling located?  Left foof- another lesion appeared yesterday  3) Are you currently taking a fluid pill? Yes- only took 20 mg yesterday instead of 40 mg  4) Are you currently SOB?  Just a little  5) Do you have a log of your daily weights (if so, list)? yes  6) Have you gained 3 pounds in a day or 5 pounds in a week? Gained i3 lbs in a day  7) Have you traveled recently? no

## 2019-05-01 NOTE — Telephone Encounter (Signed)
New Message:  Pt's Orthopedic doctor wants to start pt on Gabapentin. Daughter wans to know if this alright for pt to take 1 time a day at bedtime along all of his other medicine?

## 2019-05-01 NOTE — Telephone Encounter (Signed)
If same MD is prescribing both meds and is aware, should be ok to use both.

## 2019-05-01 NOTE — Telephone Encounter (Signed)
Patient was prescribed gabapentin 300 mg at bedtime by orthopedics. Adv that this is likely safe for him with his other medications but that I will double check with our PharmD. She is aware I will call her back if pharmacy has different information.

## 2019-05-01 NOTE — Telephone Encounter (Signed)
Patients daughter reports [name] weight is up 3 pounds today from yesterday. She had him take lasix 20 mg yesterday instead usual 40 mg dose because he had a doctor appointment.  He is slightly SOB but overall okay, he is up walking around like normal.   He has taken lasix 40 mg this am.  He maintains a low sodium diet and is elevating his legs.  One of his legs continues to have small open areas that she applies a dressing to at night because of clear bloody drainage.  Leaving ota during the day.  She stated sometimes he is instructed to double lasix for 3 days. I adv to call back tomorrow to update weight. Discussed importance of maintaining daily dose of diuretic.

## 2019-05-01 NOTE — Telephone Encounter (Signed)
Clarified with patient's daughter. The same prescriber of gabapentin prescribed Lyrica.  He takes Lyrica during the day (BID) and gabapentin at bedtime.  It is for his back.  Arthritis is severe.

## 2019-05-04 ENCOUNTER — Other Ambulatory Visit: Payer: Self-pay | Admitting: Cardiovascular Disease

## 2019-05-15 ENCOUNTER — Other Ambulatory Visit: Payer: Self-pay

## 2019-05-15 ENCOUNTER — Telehealth: Payer: Self-pay | Admitting: Cardiovascular Disease

## 2019-05-15 ENCOUNTER — Inpatient Hospital Stay (HOSPITAL_COMMUNITY)
Admission: EM | Admit: 2019-05-15 | Discharge: 2019-05-18 | DRG: 378 | Disposition: A | Discharge status: Home / Self Care | Payer: Medicare Other | Attending: Family Medicine | Admitting: Family Medicine

## 2019-05-15 ENCOUNTER — Encounter (HOSPITAL_COMMUNITY): Payer: Self-pay | Admitting: Family Medicine

## 2019-05-15 DIAGNOSIS — N184 Chronic kidney disease, stage 4 (severe): Secondary | ICD-10-CM | POA: Diagnosis present

## 2019-05-15 DIAGNOSIS — I5032 Chronic diastolic (congestive) heart failure: Secondary | ICD-10-CM

## 2019-05-15 DIAGNOSIS — Z96653 Presence of artificial knee joint, bilateral: Secondary | ICD-10-CM | POA: Diagnosis present

## 2019-05-15 DIAGNOSIS — I872 Venous insufficiency (chronic) (peripheral): Secondary | ICD-10-CM | POA: Diagnosis present

## 2019-05-15 DIAGNOSIS — N401 Enlarged prostate with lower urinary tract symptoms: Secondary | ICD-10-CM | POA: Diagnosis present

## 2019-05-15 DIAGNOSIS — I10 Essential (primary) hypertension: Secondary | ICD-10-CM

## 2019-05-15 DIAGNOSIS — D509 Iron deficiency anemia, unspecified: Secondary | ICD-10-CM | POA: Diagnosis present

## 2019-05-15 DIAGNOSIS — I255 Ischemic cardiomyopathy: Secondary | ICD-10-CM | POA: Diagnosis present

## 2019-05-15 DIAGNOSIS — D62 Acute posthemorrhagic anemia: Secondary | ICD-10-CM | POA: Diagnosis present

## 2019-05-15 DIAGNOSIS — I2510 Atherosclerotic heart disease of native coronary artery without angina pectoris: Secondary | ICD-10-CM | POA: Diagnosis present

## 2019-05-15 DIAGNOSIS — Z20828 Contact with and (suspected) exposure to other viral communicable diseases: Secondary | ICD-10-CM | POA: Diagnosis present

## 2019-05-15 DIAGNOSIS — Z8719 Personal history of other diseases of the digestive system: Secondary | ICD-10-CM

## 2019-05-15 DIAGNOSIS — R55 Syncope and collapse: Secondary | ICD-10-CM | POA: Diagnosis present

## 2019-05-15 DIAGNOSIS — Z7902 Long term (current) use of antithrombotics/antiplatelets: Secondary | ICD-10-CM

## 2019-05-15 DIAGNOSIS — K922 Gastrointestinal hemorrhage, unspecified: Secondary | ICD-10-CM | POA: Diagnosis present

## 2019-05-15 DIAGNOSIS — Z8673 Personal history of transient ischemic attack (TIA), and cerebral infarction without residual deficits: Secondary | ICD-10-CM

## 2019-05-15 DIAGNOSIS — H9190 Unspecified hearing loss, unspecified ear: Secondary | ICD-10-CM | POA: Diagnosis present

## 2019-05-15 DIAGNOSIS — D649 Anemia, unspecified: Secondary | ICD-10-CM

## 2019-05-15 DIAGNOSIS — I739 Peripheral vascular disease, unspecified: Secondary | ICD-10-CM | POA: Diagnosis present

## 2019-05-15 DIAGNOSIS — G8929 Other chronic pain: Secondary | ICD-10-CM | POA: Diagnosis present

## 2019-05-15 DIAGNOSIS — D693 Immune thrombocytopenic purpura: Secondary | ICD-10-CM | POA: Diagnosis present

## 2019-05-15 DIAGNOSIS — E785 Hyperlipidemia, unspecified: Secondary | ICD-10-CM | POA: Diagnosis present

## 2019-05-15 DIAGNOSIS — Z7901 Long term (current) use of anticoagulants: Secondary | ICD-10-CM | POA: Diagnosis not present

## 2019-05-15 DIAGNOSIS — Z888 Allergy status to other drugs, medicaments and biological substances status: Secondary | ICD-10-CM

## 2019-05-15 DIAGNOSIS — K2970 Gastritis, unspecified, without bleeding: Secondary | ICD-10-CM | POA: Diagnosis not present

## 2019-05-15 DIAGNOSIS — K219 Gastro-esophageal reflux disease without esophagitis: Secondary | ICD-10-CM | POA: Diagnosis present

## 2019-05-15 DIAGNOSIS — Z955 Presence of coronary angioplasty implant and graft: Secondary | ICD-10-CM

## 2019-05-15 DIAGNOSIS — I5030 Unspecified diastolic (congestive) heart failure: Secondary | ICD-10-CM | POA: Diagnosis present

## 2019-05-15 DIAGNOSIS — I252 Old myocardial infarction: Secondary | ICD-10-CM | POA: Diagnosis not present

## 2019-05-15 DIAGNOSIS — J309 Allergic rhinitis, unspecified: Secondary | ICD-10-CM | POA: Diagnosis present

## 2019-05-15 DIAGNOSIS — I4821 Permanent atrial fibrillation: Secondary | ICD-10-CM | POA: Diagnosis present

## 2019-05-15 DIAGNOSIS — I5042 Chronic combined systolic (congestive) and diastolic (congestive) heart failure: Secondary | ICD-10-CM | POA: Diagnosis present

## 2019-05-15 DIAGNOSIS — I130 Hypertensive heart and chronic kidney disease with heart failure and stage 1 through stage 4 chronic kidney disease, or unspecified chronic kidney disease: Secondary | ICD-10-CM | POA: Diagnosis present

## 2019-05-15 DIAGNOSIS — I4819 Other persistent atrial fibrillation: Secondary | ICD-10-CM

## 2019-05-15 DIAGNOSIS — N320 Bladder-neck obstruction: Secondary | ICD-10-CM | POA: Diagnosis present

## 2019-05-15 DIAGNOSIS — Z86718 Personal history of other venous thrombosis and embolism: Secondary | ICD-10-CM

## 2019-05-15 DIAGNOSIS — K2990 Gastroduodenitis, unspecified, without bleeding: Secondary | ICD-10-CM | POA: Diagnosis not present

## 2019-05-15 DIAGNOSIS — I5020 Unspecified systolic (congestive) heart failure: Secondary | ICD-10-CM | POA: Diagnosis present

## 2019-05-15 DIAGNOSIS — Z8672 Personal history of thrombophlebitis: Secondary | ICD-10-CM

## 2019-05-15 DIAGNOSIS — Z79899 Other long term (current) drug therapy: Secondary | ICD-10-CM

## 2019-05-15 HISTORY — DX: Other persistent atrial fibrillation: I48.19

## 2019-05-15 HISTORY — DX: Anemia, unspecified: D64.9

## 2019-05-15 HISTORY — DX: Chronic kidney disease, stage 4 (severe): N18.4

## 2019-05-15 HISTORY — DX: Immune thrombocytopenic purpura: D69.3

## 2019-05-15 HISTORY — DX: Gastrointestinal hemorrhage, unspecified: K92.2

## 2019-05-15 LAB — URINALYSIS, ROUTINE W REFLEX MICROSCOPIC
Bacteria, UA: NONE SEEN
Bilirubin Urine: NEGATIVE
Glucose, UA: NEGATIVE mg/dL
Hgb urine dipstick: NEGATIVE
Ketones, ur: NEGATIVE mg/dL
Nitrite: NEGATIVE
Protein, ur: NEGATIVE mg/dL
Specific Gravity, Urine: 1.009 (ref 1.005–1.030)
pH: 6 (ref 5.0–8.0)

## 2019-05-15 LAB — BASIC METABOLIC PANEL
Anion gap: 14 (ref 5–15)
BUN: 50 mg/dL — ABNORMAL HIGH (ref 8–23)
CO2: 24 mmol/L (ref 22–32)
Calcium: 9.5 mg/dL (ref 8.9–10.3)
Chloride: 100 mmol/L (ref 98–111)
Creatinine, Ser: 2.12 mg/dL — ABNORMAL HIGH (ref 0.61–1.24)
GFR calc Af Amer: 30 mL/min — ABNORMAL LOW (ref >60)
GFR calc non Af Amer: 26 mL/min — ABNORMAL LOW (ref >60)
Glucose, Bld: 193 mg/dL — ABNORMAL HIGH (ref 70–99)
Potassium: 4 mmol/L (ref 3.5–5.1)
Sodium: 138 mmol/L (ref 135–145)

## 2019-05-15 LAB — CBC WITH DIFFERENTIAL/PLATELET
Abs Immature Granulocytes: 0.03 10*3/uL (ref 0.00–0.07)
Basophils Absolute: 0 10*3/uL (ref 0.0–0.1)
Basophils Relative: 1 %
Eosinophils Absolute: 0.1 10*3/uL (ref 0.0–0.5)
Eosinophils Relative: 1 %
HCT: 24.3 % — ABNORMAL LOW (ref 39.0–52.0)
Hemoglobin: 6.7 g/dL — CL (ref 13.0–17.0)
Immature Granulocytes: 1 %
Lymphocytes Relative: 15 %
Lymphs Abs: 0.9 10*3/uL (ref 0.7–4.0)
MCH: 20.6 pg — ABNORMAL LOW (ref 26.0–34.0)
MCHC: 27.6 g/dL — ABNORMAL LOW (ref 30.0–36.0)
MCV: 74.5 fL — ABNORMAL LOW (ref 80.0–100.0)
Monocytes Absolute: 0.6 10*3/uL (ref 0.1–1.0)
Monocytes Relative: 10 %
Neutro Abs: 4.4 10*3/uL (ref 1.7–7.7)
Neutrophils Relative %: 72 %
Platelets: 125 10*3/uL — ABNORMAL LOW (ref 150–400)
RBC: 3.26 MIL/uL — ABNORMAL LOW (ref 4.22–5.81)
RDW: 18.8 % — ABNORMAL HIGH (ref 11.5–15.5)
WBC: 6 10*3/uL (ref 4.0–10.5)
nRBC: 0 % (ref 0.0–0.2)

## 2019-05-15 LAB — CBC
HCT: 24.6 % — ABNORMAL LOW (ref 39.0–52.0)
Hemoglobin: 7.3 g/dL — ABNORMAL LOW (ref 13.0–17.0)
MCH: 21.9 pg — ABNORMAL LOW (ref 26.0–34.0)
MCHC: 29.7 g/dL — ABNORMAL LOW (ref 30.0–36.0)
MCV: 73.9 fL — ABNORMAL LOW (ref 80.0–100.0)
Platelets: 114 10*3/uL — ABNORMAL LOW (ref 150–400)
RBC: 3.33 MIL/uL — ABNORMAL LOW (ref 4.22–5.81)
RDW: 19.4 % — ABNORMAL HIGH (ref 11.5–15.5)
WBC: 6.8 10*3/uL (ref 4.0–10.5)
nRBC: 0 % (ref 0.0–0.2)

## 2019-05-15 LAB — IRON AND TIBC
Iron: 14 ug/dL — ABNORMAL LOW (ref 45–182)
Saturation Ratios: 3 % — ABNORMAL LOW (ref 17.9–39.5)
TIBC: 479 ug/dL — ABNORMAL HIGH (ref 250–450)
UIBC: 465 ug/dL

## 2019-05-15 LAB — SARS CORONAVIRUS 2 (TAT 6-24 HRS): SARS Coronavirus 2: NEGATIVE

## 2019-05-15 LAB — FERRITIN: Ferritin: 18 ng/mL — ABNORMAL LOW (ref 24–336)

## 2019-05-15 LAB — POC OCCULT BLOOD, ED: Fecal Occult Bld: POSITIVE — AB

## 2019-05-15 LAB — PREPARE RBC (CROSSMATCH)

## 2019-05-15 LAB — BRAIN NATRIURETIC PEPTIDE: B Natriuretic Peptide: 311 pg/mL — ABNORMAL HIGH (ref 0.0–100.0)

## 2019-05-15 MED ORDER — PANTOPRAZOLE SODIUM 40 MG IV SOLR
40.0000 mg | Freq: Two times a day (BID) | INTRAVENOUS | Status: DC
  Administered 2019-05-15 – 2019-05-18 (×6): 40 mg via INTRAVENOUS
  Filled 2019-05-15 (×6): qty 40

## 2019-05-15 MED ORDER — ACETAMINOPHEN 650 MG RE SUPP: 650.0000 mg | Freq: Four times a day (QID) | RECTAL | Status: DC | PRN

## 2019-05-15 MED ORDER — SODIUM CHLORIDE 0.9 % IV SOLN
10.0000 mL/h | Freq: Once | INTRAVENOUS | Status: AC
  Administered 2019-05-15: 15:00:00 10 mL/h via INTRAVENOUS

## 2019-05-15 MED ORDER — ACETAMINOPHEN 325 MG PO TABS
650.0000 mg | ORAL_TABLET | Freq: Four times a day (QID) | ORAL | Status: DC | PRN
  Administered 2019-05-17: 650 mg via ORAL
  Filled 2019-05-15: qty 2

## 2019-05-15 MED ORDER — PANTOPRAZOLE SODIUM 40 MG IV SOLR
40.0000 mg | Freq: Once | INTRAVENOUS | Status: AC
  Administered 2019-05-15: 40 mg via INTRAVENOUS
  Filled 2019-05-15: qty 40

## 2019-05-15 MED ORDER — SODIUM CHLORIDE 0.9% IV SOLUTION: Freq: Once | INTRAVENOUS | Status: DC

## 2019-05-15 MED ORDER — METOPROLOL TARTRATE 12.5 MG HALF TABLET
12.5000 mg | ORAL_TABLET | Freq: Two times a day (BID) | ORAL | Status: DC
  Administered 2019-05-16 – 2019-05-18 (×5): 12.5 mg via ORAL
  Filled 2019-05-15 (×5): qty 1

## 2019-05-15 MED ORDER — FUROSEMIDE 10 MG/ML IJ SOLN
20.0000 mg | Freq: Once | INTRAMUSCULAR | Status: AC
  Administered 2019-05-15: 20 mg via INTRAVENOUS
  Filled 2019-05-15: qty 2

## 2019-05-15 NOTE — ED Notes (Signed)
Daughters-Please call them at any time.  [name]: [phone]  [name]: [phone]

## 2019-05-15 NOTE — ED Provider Notes (Signed)
[facility] Provider Note   CSN: [id number] Arrival date & time: 05/15/19  6213     History   Chief Complaint Chief Complaint  Patient presents with  . Near Syncope    HPI [name] is a 85 y.o. male.     HPI   85 year old man with what sounds like near syncope.  Incident happened around 7 AM this morning.  Patient was shaving when he began having worsening pain in his lower back.  This is a chronic issue related to spinal stenosis.  He began feeling lightheaded and was able to get over to a nearby bed and sit down.  He looked very pale as if he may pass out but did not actually lose consciousness. No pain aside form his lower back. Was able to walk after and eat breakfast. After eating though he had to sit back down because he just didn't feel right. Reports mild dyspnea over the past several days. He weighs himself daily and this has been stable. Hx of afib and anticoagulated. No BRBPR or melena.   Past Medical History:  Diagnosis Date  . Coronary atherosclerosis of native coronary artery    a. Anterior STEMI 2009 s/p BMSx2 to mid & prox LAD and angioplasty to distal LAD. total RCA with collaterals, moderate Cx plaquing. a. EF 55-60% in 2013.  [name] Elevated troponin   . Gross hematuria   . Gross hematuria 11/18/2008   Qualifier: Diagnosis of  By: Owens Shark, RN, BSN, [name]    . Hemoptysis 05/16/2016  . HTN (hypertension)   . Myocardial infarct ([place]) 03/21/2008  . NSVT (nonsustained ventricular tachycardia) ([place])    a. Per DC summary from time of STEMI 2009.  [name] PAF (paroxysmal atrial fibrillation) ([place])   . Paroxysmal atrial flutter ([place])    a. Abnl EKG 01/2012 concerning for atrial flutter, event monitor showed sinus bradycardia only and no pauses. Not felt to be a candidate for longterm anticoag due to hematuria.  . Phlebitis and thrombophlebitis of superficial vessels of lower extremities   . Pure hypercholesterolemia    a. Has not  tolerated statins in the past.  . Right leg DVT ([place])    a. Dx 01/2014.  [name] Sinus bradycardia    a. By prior event monitor.  . Sinus bradycardia    a. By prior event monitor.   . Thrombocytopenia ([place])   . Urinary retention 06/20/2011    Patient Active Problem List   Diagnosis Date Noted  . GERD (gastroesophageal reflux disease) 01/11/2019  . Allergic rhinitis 01/11/2019  . GI bleed 03/17/2018  . Persistent atrial fibrillation ([place])   . Orthostatic hypotension 11/26/2016  . PAD (peripheral artery disease) ([place]) 08/13/2016  . Cerebrovascular accident (CVA) ([place]) 08/09/2016  . Abnormal CT of the chest 07/21/2016  . NSTEMI (non-ST elevated myocardial infarction) ([place]) 05/16/2016  . Chronic atrial fibrillation 05/16/2016  . Chronic ITP (idiopathic thrombocytopenia) ([facility])   . CHF (congestive heart failure), NYHA class II, chronic, diastolic ([place])   . Microscopic hematuria 09/28/2014  . Coronary atherosclerosis of native coronary artery   . Paroxysmal atrial flutter ([place])   . HTN (hypertension)   . Hyperlipidemia 04/23/2013  . Atrial arrhythmia 04/23/2013  . Benign prostate hyperplasia 10/04/2011  . Bladder neck obstruction 10/04/2011  . Enlarged prostate with lower urinary tract symptoms (LUTS) 06/09/2011  . Congestive heart failure with left ventricular diastolic dysfunction ([place]) 05/25/2011  . Coronary artery disease involving coronary bypass graft with angina pectoris (  [place]) 05/25/2011  . Degenerative disk disease 12/20/2010    Past Surgical History:  Procedure Laterality Date  . CARPAL TUNNEL RELEASE    . CORONARY STENT INTERVENTION N/A 03/06/2018   Procedure: CORONARY STENT INTERVENTION;  Surgeon: Lorretta Harp, MD;  Location: [facility];  Service: Cardiovascular;  Laterality: N/A;  . CORONARY STENT PLACEMENT  2009  . ESOPHAGOGASTRODUODENOSCOPY (EGD) WITH PROPOFOL N/A 03/19/2018   Procedure: ESOPHAGOGASTRODUODENOSCOPY (EGD) WITH PROPOFOL;  Surgeon: Wonda Horner,  MD;  Location[facility] ENDOSCOPY;  Service: Endoscopy;  Laterality: N/A;  . LEFT HEART CATH AND CORONARY ANGIOGRAPHY N/A 03/06/2018   Procedure: LEFT HEART CATH AND CORONARY ANGIOGRAPHY;  Surgeon: Lorretta Harp, MD;  Location: [facility];  Service: Cardiovascular;  Laterality: N/A;  . REPLACEMENT TOTAL KNEE BILATERAL     Left 2014 (Dr. Eddie Dibbles); right 2012 (Dr. Redmond Pulling)  . trigger finger surgery          Home Medications    Prior to Admission medications   Medication Sig Start Date End Date Taking? Authorizing Provider  clopidogrel (PLAVIX) 75 MG tablet TAKE 1 TABLET (75 MG TOTAL) BY MOUTH DAILY WITH BREAKFAST. 08/29/18   Burnell Blanks, MD  famotidine (PEPCID) 20 MG tablet TAKE 1 TABLET BY MOUTH TWICE A DAY Patient taking differently: Take 20 mg by mouth 2 (two) times daily.  01/05/19   Leeanne Rio, MD  fluticasone Asencion Islam) 50 MCG/ACT nasal spray Place 2 sprays into both nostrils daily as needed for allergies or rhinitis. 01/11/19   Leeanne Rio, MD  furosemide (LASIX) 40 MG tablet Take 1 tablet (40 mg total) by mouth daily. 01/15/19 01/10/20  Burnell Blanks, MD  loratadine (CLARITIN) 10 MG tablet Take 10 mg by mouth daily as needed for allergies.     [provider]  methocarbamol (ROBAXIN) 500 MG tablet Take 500 mg by mouth 2 (two) times daily as needed. 05/10/19   [provider]  metoprolol tartrate (LOPRESSOR) 25 MG tablet Take 0.5 tablets (12.5 mg total) by mouth 2 (two) times daily. 05/04/19   Burnell Blanks, MD  nitroGLYCERIN (NITROSTAT) 0.4 MG SL tablet Place 0.4 mg under the tongue every 5 (five) minutes as needed for chest pain.    [provider]  pantoprazole (PROTONIX) 40 MG tablet Take 1 tablet (40 mg total) by mouth 2 (two) times daily. 01/11/19   Leeanne Rio, MD  potassium chloride SA (K-DUR) 20 MEQ tablet Take 20 mEq by mouth daily.    [provider]  pravastatin (PRAVACHOL) 20 MG tablet  Take 1 tablet (20 mg total) by mouth daily at 6 PM. 01/11/19   Leeanne Rio, MD  pregabalin (LYRICA) 75 MG capsule Take 1 capsule (75 mg total) by mouth 2 (two) times daily. 05/18/16   Asencion Partridge, MD  Rivaroxaban (XARELTO) 15 MG TABS tablet Take 1 tablet (15 mg total) by mouth daily with supper. 04/30/19   Burnell Blanks, MD  senna (SENOKOT) 8.6 MG TABS tablet Take 1 tablet (8.6 mg total) by mouth daily as needed for mild constipation. 04/14/18   Nuala Alpha, DO    Family History Family History  Problem Relation Age of Onset  . Cancer Sister   . Cancer Sister     Social History Social History   Tobacco Use  . Smoking status: Never Smoker  . Smokeless tobacco: Never Used  Substance Use Topics  . Alcohol use: No  . Drug use: No  Allergies   Prednisone and Rosuvastatin   Review of Systems Review of Systems All systems reviewed and negative, other than as noted in HPI.   Physical Exam Updated Vital Signs BP (!) 114/58 (BP Location: Right Arm)   Pulse 63   Temp (!) 97.2 F (36.2 C) (Oral)   Resp 16   Ht 5\' 11"  (1.803 m)   Wt 80.1 kg   SpO2 100%   BMI 24.63 kg/m   Physical Exam Vitals signs and nursing note reviewed.  Constitutional:      General: He is not in acute distress.    Appearance: He is well-developed.  HENT:     Head: Normocephalic and atraumatic.  Eyes:     General:        Right eye: No discharge.        Left eye: No discharge.     Conjunctiva/sclera: Conjunctivae normal.  Neck:     Musculoskeletal: Neck supple.  Cardiovascular:     Rate and Rhythm: Normal rate. Rhythm irregular.     Heart sounds: Normal heart sounds. No murmur. No friction rub. No gallop.   Pulmonary:     Effort: Pulmonary effort is normal. No respiratory distress.     Breath sounds: Normal breath sounds.  Abdominal:     General: There is no distension.     Palpations: Abdomen is soft.     Tenderness: There is no abdominal tenderness.   Musculoskeletal:        General: No tenderness.     Comments: Mild lower extremity edema, left slightly worse than right.  Chronic stasis changes.  Skin:    General: Skin is warm and dry.  Neurological:     Mental Status: He is alert.  Psychiatric:        Behavior: Behavior normal.        Thought Content: Thought content normal.      ED Treatments / Results  Labs (all labs ordered are listed, but only abnormal results are displayed) Labs Reviewed  CBC WITH DIFFERENTIAL/PLATELET - Abnormal; Notable for the following components:      Result Value   RBC 3.26 (*)    Hemoglobin 6.7 (*)    HCT 24.3 (*)    MCV 74.5 (*)    MCH 20.6 (*)    MCHC 27.6 (*)    RDW 18.8 (*)    Platelets 125 (*)    All other components within normal limits  BASIC METABOLIC PANEL - Abnormal; Notable for the following components:   Glucose, Bld 193 (*)    BUN 50 (*)    Creatinine, Ser 2.12 (*)    GFR calc non Af Amer 26 (*)    GFR calc Af Amer 30 (*)    All other components within normal limits  URINALYSIS, ROUTINE W REFLEX MICROSCOPIC - Abnormal; Notable for the following components:   Leukocytes,Ua TRACE (*)    All other components within normal limits  BRAIN NATRIURETIC PEPTIDE - Abnormal; Notable for the following components:   B Natriuretic Peptide 311.0 (*)    All other components within normal limits  IRON AND TIBC - Abnormal; Notable for the following components:   Iron 14 (*)    TIBC 479 (*)    Saturation Ratios 3 (*)    All other components within normal limits  FERRITIN - Abnormal; Notable for the following components:   Ferritin 18 (*)    All other components within normal limits  BASIC METABOLIC PANEL - Abnormal; Notable  for the following components:   Glucose, Bld 115 (*)    BUN 45 (*)    Creatinine, Ser 2.07 (*)    GFR calc non Af Amer 27 (*)    GFR calc Af Amer 31 (*)    All other components within normal limits  CBC - Abnormal; Notable for the following components:   RBC  3.33 (*)    Hemoglobin 7.3 (*)    HCT 24.6 (*)    MCV 73.9 (*)    MCH 21.9 (*)    MCHC 29.7 (*)    RDW 19.4 (*)    Platelets 114 (*)    All other components within normal limits  CBC - Abnormal; Notable for the following components:   RBC 3.70 (*)    Hemoglobin 8.3 (*)    HCT 27.7 (*)    MCV 74.9 (*)    MCH 22.4 (*)    RDW 19.9 (*)    Platelets 110 (*)    All other components within normal limits  CBC - Abnormal; Notable for the following components:   RBC 3.96 (*)    Hemoglobin 9.0 (*)    HCT 30.0 (*)    MCV 75.8 (*)    MCH 22.7 (*)    RDW 20.0 (*)    Platelets 121 (*)    All other components within normal limits  BASIC METABOLIC PANEL - Abnormal; Notable for the following components:   Glucose, Bld 121 (*)    BUN 42 (*)    Creatinine, Ser 1.97 (*)    GFR calc non Af Amer 29 (*)    GFR calc Af Amer 33 (*)    All other components within normal limits  POC OCCULT BLOOD, ED - Abnormal; Notable for the following components:   Fecal Occult Bld POSITIVE (*)    All other components within normal limits  SARS CORONAVIRUS 2 (TAT 6-24 HRS)  CBC  CBC  TYPE AND SCREEN  PREPARE RBC (CROSSMATCH)  PREPARE RBC (CROSSMATCH)    EKG EKG Interpretation  Date/Time:  Tuesday May 15 2019 10:00:28 EST Ventricular Rate:  68 PR Interval:    QRS Duration: 120 QT Interval:  453 QTC Calculation: 482 R Axis:   -93 Text Interpretation: Atrial fibrillation Left anterior fascicular block Consider anterior infarct Nonspecific T abnormalities, lateral leads Confirmed by Virgel Manifold [phone]) on 05/15/2019 10:31:46 AM   Radiology No results found.  Procedures Procedures (including critical care time)  CRITICAL CARE Performed by: Virgel Manifold Total critical care time: 35 minutes Critical care time was exclusive of separately billable procedures and treating other patients. Critical care was necessary to treat or prevent imminent or life-threatening deterioration. Critical care  was time spent personally by me on the following activities: development of treatment plan with patient and/or surrogate as well as nursing, discussions with consultants, evaluation of patient's response to treatment, examination of patient, obtaining history from patient or surrogate, ordering and performing treatments and interventions, ordering and review of laboratory studies, ordering and review of radiographic studies, pulse oximetry and re-evaluation of patient's condition.   Medications Ordered in ED Medications - No data to display   Initial Impression / Assessment and Plan / ED Course  I have reviewed the triage vital signs and the nursing notes.  Pertinent labs & imaging results that were available during my care of the patient were reviewed by me and considered in my medical decision making (see chart for details).        89yM  with near syncope. Likely symptomatic anemia. He is anticoagulated for hx of afib. GIB? PPI. Check hemoccult. Transfuse PRBC. Admit.   Final Clinical Impressions(s) / ED Diagnoses   Final diagnoses:  Symptomatic anemia  Near syncope    ED Discharge Orders    None       Virgel Manifold, MD 05/17/19 [phone]

## 2019-05-15 NOTE — Telephone Encounter (Addendum)
Spoke to patient's daughters [name] and then [place]. This morning patient is pale, pasty looking.  SOB walking short distances and after walking back to sit on bed this morning he became dazed, staring and daughter thought he was going to pass out.  He came right back around and said I don't feel right, I feel different.  100/46, 68.  His daughter was ready to call 911.  His weight is 176.6 today, up approx 3 pounds from his usual weight. Had been feeling his usual self until this point this morning. The patient is requesting to be seen today.   I discussed with Dr. Radford Pax (DOD) to determine if patient could be seen in office today.  She recommends patient to to ER for evaluation of his symptoms this morning.  I called back and spoke with daughter, Benton Tooker and informed of this recommendation.  She will take him to [facility] ER this morning for further evaluation. Called and spoke with Wannetta Sender, cardmaster to let them know he will be coming.

## 2019-05-15 NOTE — ED Notes (Signed)
ED TO INPATIENT HANDOFF REPORT  ED Nurse Name and Phone #: Rohin Krejci [phone]  S Name/Age/Gender [name] 85 y.o. male Room/Bed: 043C/043C  Code Status   Code Status: Full Code  Home/SNF/Other Home Patient oriented to: self, place and situation Is this baseline? Yes   Triage Complete: Triage complete  Chief Complaint SOB   Triage Note Pt went to sit in a chair and had sever back pain . Family reported Pt almost passed out. Pt has pian from spinal stenosis . Pt alert on arrival on arrival to room.   Allergies Allergies  Allergen Reactions  . Prednisone Other (See Comments)    "Made me not feel right when I took it"  . Rosuvastatin Other (See Comments)    "Makes me ache"    Level of Care/Admitting Diagnosis ED Disposition    ED Disposition Condition Comment   Admit  Hospital Area: [facility] [[id number]]  Level of Care: Telemetry Cardiac [103]  Covid Evaluation: Confirmed COVID Negative  Diagnosis: GI bleed [[id number]]  Admitting Physician: Gladys Damme [[id number]]  Attending Physician: MCDIARMID, TODD D [[id number]]  Estimated length of stay: past midnight tomorrow  Certification:: I certify this patient will need inpatient services for at least 2 midnights  PT Class (Do Not Modify): Inpatient [101]  PT Acc Code (Do Not Modify): Private [1]       B Medical/Surgery History Past Medical History:  Diagnosis Date  . Abnormal CT of the chest 07/21/2016   January 2018 IMPRESSION: 1. Residual patchy ground-glass opacities throughout both lungs at the resolved areas of consolidation from the 05/16/2016 chest CT, consistent with a resolving infectious or inflammatory process. 2. Bandlike opacity in the anterior right upper lobe is new since 05/16/2016, was probably present on 06/17/2016 chest radiograph, favor evolving postinfectious/postinflammatory   . Allergic rhinitis 01/11/2019  . Atrial arrhythmia 04/23/2013   Overview:  Abnormal ekg July 2013 c/f  atrial flutter. Seen in the EP clinic. Event monitor showed sinus brady only with no pauses. Not felt to be a candidate for long term anti-coagulation 2/2 hematuria.   . Benign prostate hyperplasia 10/04/2011  . Bladder neck obstruction 10/04/2011  . Cerebrovascular accident (CVA) ([facility]) 08/09/2016  . CHF (congestive heart failure), NYHA class II, chronic, diastolic ([facility])    Echo 1601 LV with mild concentric hypertrophy, EF 55-60%, no regional wall motion abnormalities  . Chronic atrial fibrillation 05/16/2016  . Chronic ITP (idiopathic thrombocytopenia) (H[facility])   . Chronic kidney disease, stage 4 (severe) ([place]) 05/15/2019  . Congestive heart failure with left ventricular diastolic dysfunction ([place] [facility]) 05/25/2011   Overview:  TTE 02/02/12: Moderate focal basal hypertrophy of the ventricular septum. EF 55-60%. Grade 1 diastolic dysfunction. Mildly dilated LA and RA. Increased thickness of the atrial septum c/w lipomatous hypertrophy.   . Coronary artery disease involving coronary bypass graft with angina pectoris ([place]) 05/25/2011   Overview:  Cardiac cath 03/21/08: Mild-moderate reduction in LV function with an anteroapical wall motion abnormality. Total occlusion of the mid LAD with successful percutaneous angioplasty and stenting using a non drug eluting stent. Successful stenting of a moderate proximal lesion of the LAD. Successful angioplasty of distal LAD stenosis. Total occlusion of the RCA with recollateralization of t  . Coronary atherosclerosis of native coronary artery    a. Anterior STEMI 2009 s/p BMSx2 to mid & prox LAD and angioplasty to distal LAD. total RCA with collaterals, moderate Cx plaquing. a. EF 55-60% in 2013.  [name] Degenerative disk disease 12/20/2010  Sees Dr. Eddie Dibbles.  Has had MRI.   [name] Elevated troponin   . Enlarged prostate with lower urinary tract symptoms (LUTS) 06/09/2011  . GERD (gastroesophageal reflux disease) 01/11/2019  . Gross hematuria   . Gross hematuria 11/18/2008   Qualifier:  Diagnosis of  By: Owens Shark, RN, BSN, [name]    . Hemoptysis 05/16/2016  . History of GI bleed 03/17/2018   Admitted 03/17/18 with acute GI bleeding. EGD showed gastritis but no active bleeding. Aspirin stopped and Plavix and Xareltro continued.   [name] HTN (hypertension)   . Hyperlipidemia 04/23/2013  . Microscopic hematuria 09/28/2014  . Myocardial infarct ([place]) 03/21/2008  . NSTEMI (non-ST elevated myocardial infarction) ([facility]) 05/16/2016  . NSVT (nonsustained ventricular tachycardia) ([facility])    a. Per DC summary from time of STEMI 2009.  . Orthostatic hypotension 11/26/2016  . PAD (peripheral artery disease) ([facility]) 08/13/2016  . PAF (paroxysmal atrial fibrillation) ([facility])   . Paroxysmal atrial flutter ([place])    a. Abnl EKG 01/2012 concerning for atrial flutter, event monitor showed sinus bradycardia only and no pauses. Not felt to be a candidate for longterm anticoag due to hematuria.  . Persistent atrial fibrillation ([place])   . Phlebitis and thrombophlebitis of superficial vessels of lower extremities   . Pure hypercholesterolemia    a. Has not tolerated statins in the past.  . Right leg DVT ([facility])    a. Dx 01/2014.  [name] Sinus bradycardia    a. By prior event monitor.  . Sinus bradycardia    a. By prior event monitor.   . Thrombocytopenia ([facility])   . Urinary retention 06/20/2011   Past Surgical History:  Procedure Laterality Date  . CARPAL TUNNEL RELEASE    . CORONARY STENT INTERVENTION N/A 03/06/2018   Procedure: CORONARY STENT INTERVENTION;  Surgeon: Lorretta Harp, MD;  Location: [facility];  Service: Cardiovascular;  Laterality: N/A;  . CORONARY STENT PLACEMENT  2009  . ESOPHAGOGASTRODUODENOSCOPY (EGD) WITH PROPOFOL N/A 03/19/2018   Procedure: ESOPHAGOGASTRODUODENOSCOPY (EGD) WITH PROPOFOL;  Surgeon: Wonda Horner, MD;  Location: [facility] ENDOSCOPY;  Service: Endoscopy;  Laterality: N/A;  . LEFT HEART CATH AND CORONARY ANGIOGRAPHY N/A 03/06/2018   Procedure: LEFT HEART CATH AND CORONARY ANGIOGRAPHY;   Surgeon: Lorretta Harp, MD;  Location: [facility];  Service: Cardiovascular;  Laterality: N/A;  . REPLACEMENT TOTAL KNEE BILATERAL     Left 2014 (Dr. Eddie Dibbles); right 2012 (Dr. Redmond Pulling)  . trigger finger surgery       A IV Location/Drains/Wounds Patient Lines/Drains/Airways Status   Active Line/Drains/Airways    Name:   Placement date:   Placement time:   Site:   Days:   Peripheral IV 05/15/19 Right Antecubital   05/15/19    1400    Antecubital   less than 1          Intake/Output Last 24 hours  Intake/Output Summary (Last 24 hours) at 05/15/2019 2135 Last data filed at 05/15/2019 1854 Gross per 24 hour  Intake 347.5 ml  Output 325 ml  Net 22.5 ml    Labs/Imaging Results for orders placed or performed during the hospital encounter of 05/15/19 (from the past 48 hour(s))  Urinalysis, Routine w reflex microscopic     Status: Abnormal   Collection Time: 05/15/19 10:07 AM  Result Value Ref Range   Color, Urine YELLOW YELLOW   APPearance CLEAR CLEAR   Specific Gravity, Urine 1.009 1.005 - 1.030   pH 6.0 5.0 - 8.0   Glucose, UA  NEGATIVE NEGATIVE mg/dL   Hgb urine dipstick NEGATIVE NEGATIVE   Bilirubin Urine NEGATIVE NEGATIVE   Ketones, ur NEGATIVE NEGATIVE mg/dL   Protein, ur NEGATIVE NEGATIVE mg/dL   Nitrite NEGATIVE NEGATIVE   Leukocytes,Ua TRACE (A) NEGATIVE   RBC / HPF 0-5 0 - 5 RBC/hpf   WBC, UA 6-10 0 - 5 WBC/hpf   Bacteria, UA NONE SEEN NONE SEEN   Mucus PRESENT     Comment: Performed at [facility] [address], [place], [place] [address]  SARS CORONAVIRUS 2 (TAT 6-24 HRS) Nasopharyngeal Nasopharyngeal Swab     Status: None   Collection Time: 05/15/19 10:07 AM   Specimen: Nasopharyngeal Swab  Result Value Ref Range   SARS Coronavirus 2 NEGATIVE NEGATIVE    Comment: (NOTE) SARS-CoV-2 target nucleic acids are NOT DETECTED. The SARS-CoV-2 RNA is generally detectable in upper and lower respiratory specimens during the acute phase of infection.  Negative results do not preclude SARS-CoV-2 infection, do not rule out co-infections with other pathogens, and should not be used as the sole basis for treatment or other patient management decisions. Negative results must be combined with clinical observations, patient history, and epidemiological information. The expected result is Negative. Fact Sheet for Patients: SugarRoll.be Fact Sheet for Healthcare Providers: https://www.woods-mathews.com/ This test is not yet approved or cleared by the [place] FDA and  has been authorized for detection and/or diagnosis of SARS-CoV-2 by FDA under an Emergency Use Authorization (EUA). This EUA will remain  in effect (meaning this test can be used) for the duration of the COVID-19 declaration under Section 56 4(b)(1) of the Act, 21 U.S.C. section 360bbb-3(b)(1), unless the authorization is terminated or revoked sooner. Performed at [facility], [place] [address], [place], [place] [address]   CBC with Differential     Status: Abnormal   Collection Time: 05/15/19 10:49 AM  Result Value Ref Range   WBC 6.0 4.0 - 10.5 K/uL   RBC 3.26 (L) 4.22 - 5.81 MIL/uL   Hemoglobin 6.7 (LL) 13.0 - 17.0 g/dL    Comment: REPEATED TO VERIFY Reticulocyte Hemoglobin testing may be clinically indicated, consider ordering this additional test [id number] THIS CRITICAL RESULT HAS VERIFIED AND BEEN CALLED TO AUDREY MCKEOWN,RN BY ZELDA BEECH ON 11 03 2020 AT 2725, AND HAS BEEN READ BACK.     HCT 24.3 (L) 39.0 - 52.0 %   MCV 74.5 (L) 80.0 - 100.0 fL   MCH 20.6 (L) 26.0 - 34.0 pg   MCHC 27.6 (L) 30.0 - 36.0 g/dL   RDW 18.8 (H) 11.5 - 15.5 %   Platelets 125 (L) 150 - 400 K/uL   nRBC 0.0 0.0 - 0.2 %   Neutrophils Relative % 72 %   Neutro Abs 4.4 1.7 - 7.7 K/uL   Lymphocytes Relative 15 %   Lymphs Abs 0.9 0.7 - 4.0 K/uL   Monocytes Relative 10 %   Monocytes Absolute 0.6 0.1 - 1.0 K/uL   Eosinophils Relative 1 %    Eosinophils Absolute 0.1 0.0 - 0.5 K/uL   Basophils Relative 1 %   Basophils Absolute 0.0 0.0 - 0.1 K/uL   Immature Granulocytes 1 %   Abs Immature Granulocytes 0.03 0.00 - 0.07 K/uL    Comment: Performed at [facility] [address], [place], [place] [address]  Basic metabolic panel     Status: Abnormal   Collection Time: 05/15/19 10:49 AM  Result Value Ref Range   Sodium 138 135 -  145 mmol/L   Potassium 4.0 3.5 - 5.1 mmol/L   Chloride 100 98 - 111 mmol/L   CO2 24 22 - 32 mmol/L   Glucose, Bld 193 (H) 70 - 99 mg/dL   BUN 50 (H) 8 - 23 mg/dL   Creatinine, Ser 2.12 (H) 0.61 - 1.24 mg/dL   Calcium 9.5 8.9 - 10.3 mg/dL   GFR calc non Af Amer 26 (L) >60 mL/min   GFR calc Af Amer 30 (L) >60 mL/min   Anion gap 14 5 - 15    Comment: Performed at [facility] [address] [place], [place] [address]  Brain natriuretic peptide     Status: Abnormal   Collection Time: 05/15/19 10:49 AM  Result Value Ref Range   B Natriuretic Peptide 311.0 (H) 0.0 - 100.0 pg/mL    Comment: Performed at [facility] [address] [place], [place] [address]  Type and screen [place]     Status: None (Preliminary result)   Collection Time: 05/15/19 12:08 PM  Result Value Ref Range   ABO/RH(D) A POS    Antibody Screen NEG    Sample Expiration 05/18/2019,2359    Unit Number [id number]    Blood Component Type RED CELLS,LR    Unit division 00    Status of Unit ISSUED    Transfusion Status OK TO TRANSFUSE    Crossmatch Result      Compatible Performed at [facility], [place] [address] [place], [place] [address]   Prepare RBC     Status: None   Collection Time: 05/15/19 12:08 PM  Result Value Ref Range   Order Confirmation      ORDER PROCESSED BY BLOOD BANK Performed at [facility], [place] [address], [place], [place] [phone]   POC occult blood, ED     Status: Abnormal   Collection Time: 05/15/19  2:47 PM  Result Value Ref Range   Fecal Occult  Bld POSITIVE (A) NEGATIVE  Iron and TIBC     Status: Abnormal   Collection Time: 05/15/19  5:15 PM  Result Value Ref Range   Iron 14 (L) 45 - 182 ug/dL   TIBC 479 (H) 250 - 450 ug/dL   Saturation Ratios 3 (L) 17.9 - 39.5 %   UIBC 465 ug/dL    Comment: Performed at [facility], [place] [address] [place], [place] [address]  Ferritin     Status: Abnormal   Collection Time: 05/15/19  5:15 PM  Result Value Ref Range   Ferritin 18 (L) 24 - 336 ng/mL    Comment: Performed at [facility] [address], [place], [place] [address]   No results found.  Pending Labs Unresulted Labs (From admission, onward)    Start     Ordered   05/16/19 0500  CBC  Tomorrow morning,   R     05/15/19 1818   05/16/19 1324  Basic metabolic panel  Tomorrow morning,   R     05/15/19 1818   05/15/19 2123  CBC  Once,   STAT     05/15/19 2122   05/15/19 1144  Occult blood card to lab, stool RN will collect  Once,   STAT    Question:  Specimen to be collected by:  Answer:  RN will collect   05/15/19 1143          Vitals/Pain Today's Vitals   05/15/19 1930 05/15/19 2000  05/15/19 2030 05/15/19 2100  BP: (!) 108/59 (!) 106/54 108/60 109/64  Pulse: 72 79    Resp: 15 18 16 13   Temp:      TempSrc:      SpO2: 96% 99%    Weight:      Height:      PainSc:        Isolation Precautions No active isolations  Medications Medications  pantoprazole (PROTONIX) injection 40 mg (40 mg Intravenous Given 05/15/19 2128)  acetaminophen (TYLENOL) tablet 650 mg (has no administration in time range)    Or  acetaminophen (TYLENOL) suppository 650 mg (has no administration in time range)  metoprolol tartrate (LOPRESSOR) tablet 12.5 mg (0 mg Oral Hold 05/15/19 2057)  0.9 %  sodium chloride infusion (0 mL/hr Intravenous Stopped 05/15/19 1745)  pantoprazole (PROTONIX) injection 40 mg (40 mg Intravenous Given 05/15/19 1232)  furosemide (LASIX) injection 20 mg (20 mg Intravenous Given 05/15/19 2128)     Mobility walks with device High fall risk   Focused Assessments gi/neuro   R Recommendations: See Admitting Provider Note  Report given to:   Additional Notes:

## 2019-05-15 NOTE — ED Notes (Signed)
Called to give report to [place], nurse not available.

## 2019-05-15 NOTE — Telephone Encounter (Signed)
Thanks

## 2019-05-15 NOTE — Discharge Summary (Signed)
[facility] Discharge Summary  Patient name: [name] Medical record number: [id number] Date of birth: Nov 21, 1923 Age: 85 y.o. Gender: male Date of Admission: 05/15/2019  Date of Discharge: 05/18/19 Admitting Physician: Gladys Damme, MD  Primary Care Provider: Leeanne Rio, MD Consultants: GI  Indication for Hospitalization: symptomatic anemia   Discharge Diagnoses/Problem List:  Iron deficiency anemia, uncertain cause, possibly secondary to excess anticoagulant use  Atrial fibrillation on Xeralto therapy  Hypertension HFrEF  Chronic venous stasis dermatitis CKD stage IV Chronic ITP Chronic back pain Hx CVA GERD  Disposition: Home with home health  Discharge Condition: Improved  Patient reports he feels well today. No bright red blood per rectum.   Discharge Exam:   GEN: pleasant elderly male, in no acute distress CV: irregularly irregular rhythm, regular rate, no murmurs appreciated  RESP: no increased work of breathing, clear to ascultation bilaterally  ABD: bowel sounds present. soft, nontender, nondistended. MSK: no appreciable lower extremity edema SKIN: warm, dry, chronic bilateral venous stasis dermatitis NEURO: Orientation at baseline, speech normal, alert PSYCH: Normal affect and thought content   Brief Hospital Course:  [name] was admitted on 05/15/2019 for symptomatic anemia due to a GI bleed.  Hemoglobin was 6.7 on admission.  Patient's Xarelto and Plavix were held on admission, and he received 2 units of pRBCs, with an improvement in hemoglobin to 8.6 on the day of discharge.  GI was consulted and performed an EGD that did not indicate source for an active bleed. Patient is too high risk for colonoscopy and GI recommended patient have a CT ABD/Pelvis to evaluate for potential malignancy.  A CT ABD/Pelvis indicated colonic diverticulosis without definite bowel wall thickening or mass.  Patient's iron  studies showed concern for significant iron deficiency anemia.  GI recommended patient have oral iron repletion twice daily.  Patient's Plavix and Xarelto were resumed. There was some concern he may have been taking Xeralto 15 mg BID on admission, reviewed medications with family.  He is stable and appropriate for discharge at this time.  Issues for Follow Up:  1. Obtain repeat hemoglobin in 1-2 weeks. Patient to receive PT at home.  Follow-up with patient and patient family to see that this has occurred. 2. Consider Cologuard as patient is too high risk for colonoscopy. 3. Family was concerned that patient Xarelto dose was too high as it went from 2.5 mg to 15 mg.  Consult patient's cardiologist to obtain ideal therapeutic Xarelto dose for patient in the setting of his anemia. Continue to discuss transition to apixaban given patient's creatinine and risk for bleeding. Family would like to stay with [facility] for time being.  Significant Procedures: EGD and CT Ab/Pelvis  GI consultation   Significant Labs and Imaging:  Recent Labs  Lab 05/16/19 1809 05/17/19 0755 05/18/19 0355 05/18/19 1051  WBC 6.7 6.7 4.7  --   HGB 9.0* 8.9* 7.9* 8.6*  HCT 30.0* 30.1* 27.0* 29.5*  PLT 121* 133* 97*  --    Recent Labs  Lab 05/15/19 1049 05/16/19 0414 05/17/19 0508 05/18/19 0355  NA 138 144 141 140  K 4.0 4.1 3.6 3.4*  CL 100 107 104 106  CO2 24 28 26 25   GLUCOSE 193* 115* 121* 113*  BUN 50* 45* 42* 34*  CREATININE 2.12* 2.07* 1.97* 1.72*  CALCIUM 9.5 9.2 9.1 9.0    CT showed diverticular disease  EGD showed two small erosions   Results/Tests Pending at Time of Discharge: None  Discharge Medications:  Allergies as of 05/18/2019      Reactions   Prednisone Other (See Comments)   "Made me not feel right when I took it"   Rosuvastatin Other (See Comments)   "Makes me ache"      Medication List    TAKE these medications   clopidogrel 75 MG tablet Commonly known as: PLAVIX TAKE 1  TABLET (75 MG TOTAL) BY MOUTH DAILY WITH BREAKFAST. Notes to patient: 11/7 am   famotidine 20 MG tablet Commonly known as: PEPCID TAKE 1 TABLET BY MOUTH TWICE A DAY Notes to patient: 11/6 pm   ferrous sulfate 325 (65 FE) MG tablet Take 1 tablet (325 mg total) by mouth 2 (two) times daily with a meal. Notes to patient: 11/6   fluticasone 50 MCG/ACT nasal spray Commonly known as: FLONASE Place 2 sprays into both nostrils daily as needed for allergies or rhinitis.   furosemide 40 MG tablet Commonly known as: LASIX Take 1 tablet (40 mg total) by mouth daily. Notes to patient: 11/6   gabapentin 300 MG capsule Commonly known as: NEURONTIN Take 300 mg by mouth at bedtime. Notes to patient: 11/6 am   loratadine 10 MG tablet Commonly known as: CLARITIN Take 10 mg by mouth daily as needed for allergies.   methocarbamol 500 MG tablet Commonly known as: ROBAXIN Take 500 mg by mouth 2 (two) times daily as needed for muscle spasms.   metoprolol tartrate 25 MG tablet Commonly known as: LOPRESSOR Take 0.5 tablets (12.5 mg total) by mouth 2 (two) times daily. Notes to patient: 11/6 pm   nitroGLYCERIN 0.4 MG SL tablet Commonly known as: NITROSTAT Place 0.4 mg under the tongue every 5 (five) minutes as needed for chest pain.   pantoprazole 40 MG tablet Commonly known as: PROTONIX Take 1 tablet (40 mg total) by mouth 2 (two) times daily. Notes to patient: 11/6 pm   potassium chloride SA 20 MEQ tablet Commonly known as: KLOR-CON Take 20 mEq by mouth daily. Notes to patient: 11/7 am   pravastatin 20 MG tablet Commonly known as: PRAVACHOL Take 1 tablet (20 mg total) by mouth daily at 6 PM. Notes to patient: 11/6 pm   pregabalin 75 MG capsule Commonly known as: LYRICA Take 1 capsule (75 mg total) by mouth 2 (two) times daily. Notes to patient: 11/6 pm   Rivaroxaban 15 MG Tabs tablet Commonly known as: XARELTO Take 1 tablet (15 mg total) by mouth daily with supper. Notes to  patient: 11/6 dinner   senna 8.6 MG Tabs tablet Commonly known as: SENOKOT Take 1 tablet (8.6 mg total) by mouth daily as needed for mild constipation. Notes to patient: 11/7 am   Systane Ultra PF 0.4-0.3 % Soln Generic drug: Polyethyl Glyc-Propyl Glyc PF Place 1-2 drops into both eyes every morning.       Discharge Instructions: Please refer to Patient Instructions section of EMR for full details.  Patient was counseled important signs and symptoms that should prompt return to medical care, changes in medications, dietary instructions, activity restrictions, and follow up appointments.   Follow-Up Appointments: Follow-up Information    Leeanne Rio, MD .   Specialty: [facility] Medicine Contact information: [place] [address] [phone]        Burnell Blanks, MD .   Specialty: Cardiology Contact information: [place] [place] [address] [phone]          Lyndee Hensen, DO  PGY-1  Dorris Singh, MD  Faculty  Family Medicine Teaching Service

## 2019-05-15 NOTE — ED Triage Notes (Signed)
Pt went to sit in a chair and had sever back pain . Family reported Pt almost passed out. Pt has pian from spinal stenosis . Pt alert on arrival on arrival to room.

## 2019-05-15 NOTE — H&P (Addendum)
[facility] Admission History and Physical Service Pager: [phone]  Patient name: [name] Medical record number: [id number] Date of birth: 29-Jun-1924 Age: 85 y.o. Gender: male  Primary Care Provider: Leeanne Rio, MD Consultants: GI Code Status: FULL Preferred Emergency Contact: Daughter [name] [phone]  Chief Complaint: near syncope   Assessment and Plan: [name] is a 85 y.o. male presenting with near syncope, found to be acutely anemic. PMH is significant for atrial fibrillation, hypertension. HFrEF, CKD stage III, GERD,   Symptomatic anemia  Near-syncope Patient was at home this morning walking to go shave and got lightheaded. Daughter states that there was no loss of consciousness however patient looked pale. ED work-up revealed microcytic anemia hemoglobin 6.7 with MCV 74.5, BNP 311, unremarkable UA and patient is Covid negative. EKG revealed rate-controlled atrial fibrillation (HR 68). Differential diagnoses: Acute CHF exacerbation however BNP only 311 and patient is without LE edema or crackles on lung exam satting 100% on RA. Acute GI bleed seems likely with positive fecal Hemoccult. Microcytic anemia is also evident on patient's CBC and could be contributing to patient's symptoms. Valvular abnormality or worsening heart function, consider repeat ECHO. Last Echo August 2019 showing EF 30-35%.  Medication induced syncope, patient taking Roxabin daughter reports patient took this medication prior to having presyncopal episode.  Patient with history of orthostatic hypotension.  No history of vertigo. -Admit to cardiac telemetry, attending Dr. McDiarmid -Serial CBC, f/u post transfusion H/H -Consult GI ([facility]) - appreciate recs -NPO pending GI evaluation -Cardiac monitoring -Continuous pulse ox -Vitals Q4H -PT/OT eval and treat -SCDs for DVT prophylaxis -Holding Xarelto, Plavix 75mg   Atrial  fibrillation, chronic, stable Patient takes Lopressor 12.5 mg BID, Xarelto 15 mg daily. - Cardiac monitoring - Continue Lopressor - Hold Xarelto and Plavix, restart per GI recs  Hypertension Hypotensive on admission 82/68 - 119/68 and most recently 108/58. Home medications include Lopressor 12.5 mg twice daily and Lasix 40mg  daily.  - Continue home medication: Lopressor 12.5mg  BID and IV Lasix 20mg  daily - Monitor pressures  HFrEF  Chronic venous stasis lower extremity dermatitis History of combined systolic and diastolic heart failure. Most recent ECHO in  August 2019 showing EF of 30-35% with diffuse hypokinesis. Home medications include Lasix 40 mg daily, Lopressor 12.5mg  BID and K-Dur 20 mg daily.  Per chart review patient has chronic venous stasis dermatitis due to CHF.  Patient with history of right lower extremity DVT.  -IV Lasix 20 mg, hold oral Lasix while patient is NPO  -Replete potassium as needed -Continue metoprolol as above  CKD stage IV Baseline creatinine around 1.9 - 2. Creatinine 2.12 today at baseline.  -Daily BMP -Avoid nephrotoxic agents  Chronic ITP  Platelets 125 today.  Chart review patient is chronically thrombocytopenic. -Daily CBC  Chronic Back Pain Has degenerative disc disease. Takes Roxabin 500 mg twice daily, 75 mg Lyrica twice daily -Hold home Lyrica and Roxabin while NPO  CVA  Patient had CVA in Jan 2018.  Patient taking Plavix 75 mg daily, pravastatin 20 mg bedtime.  Patient and daughter report ongoing perceived decreased sensation bilateral hands. -Hold home medications while NPO  GERD Patient taking famotidine 20 mg twice daily and Protonix 40 mg twice daily.  -Hold home medications while NPO -IV Protonix 40 mg twice daily   FEN/GI: NPO, replete electrolytes as needed Prophylaxis: SCDs, Protonix   Disposition: pending medical stabilization   History of Present Illness: History provided by patient and  daughter    [name] is a 85 y.o. male presenting with near syncope and exertional dyspnea. Patient reports having shortness of breath "for a while".  Reports exertional dyspnea for about 2 weeks daughter reports, patient had a shot in his back ~2 weeks ago, he was changed from Xarelto over to Eliquis and then went back to Xarelto.  She states that his legs have been dry and "split open" referencing his chronic lower extremity edema.  Denies black stools.  Endorsed pain in his RLQ in his abdomen however denies current pain. No chest pain, he's breathing ok now at rest. He took Robaxin this morning but daughter reports this is a new medication and the patient took this medicine only 1 to 2 minutes before syncopal episode. Had some weakness this morning in his legs which patient and daughter attribute to patient's spinal stenosis.   In the ED, he was found to have an hemoglobin of 6.7 and positive fecal occult blood test.  GI was consulted.  Review Of Systems: Per HPI with the following additions:  Review of Systems  Constitutional: Positive for malaise/fatigue. Negative for fever.  HENT: Hearing loss: chronic (does not wear his hearing aids)   Respiratory: Negative for shortness of breath.   Cardiovascular: Negative for chest pain, leg swelling and PND.  Gastrointestinal: Negative for abdominal pain, blood in stool and melena.  Musculoskeletal: Positive for back pain.  Skin:       B/l LE dermatitis   Neurological: Negative for headaches.  Psychiatric/Behavioral: Negative for memory loss.    Patient Active Problem List   Diagnosis Date Noted  . Acute anemia 05/15/2019  . Chronic kidney disease, stage 4 (severe) ([place]) 05/15/2019  . Near syncope   . GERD (gastroesophageal reflux disease) 01/11/2019  . Allergic rhinitis 01/11/2019  . History of GI bleed 03/17/2018  . Persistent atrial fibrillation ([place])   . Chronic ITP (idiopathic thrombocytopenia) (H[facility])   . Coronary atherosclerosis of native coronary  artery   . HTN (hypertension)   . Hyperlipidemia 04/23/2013  . Benign prostate hyperplasia 10/04/2011  . Congestive heart failure with left ventricular diastolic dysfunction ([place]) 05/25/2011    Past Medical History: Past Medical History:  Diagnosis Date  . Abnormal CT of the chest 07/21/2016   January 2018 IMPRESSION: 1. Residual patchy ground-glass opacities throughout both lungs at the resolved areas of consolidation from the 05/16/2016 chest CT, consistent with a resolving infectious or inflammatory process. 2. Bandlike opacity in the anterior right upper lobe is new since 05/16/2016, was probably present on 06/17/2016 chest radiograph, favor evolving postinfectious/postinflammatory   . Allergic rhinitis 01/11/2019  . Atrial arrhythmia 04/23/2013   Overview:  Abnormal ekg July 2013 c/f atrial flutter. Seen in the EP clinic. Event monitor showed sinus brady only with no pauses. Not felt to be a candidate for long term anti-coagulation 2/2 hematuria.   . Benign prostate hyperplasia 10/04/2011  . Bladder neck obstruction 10/04/2011  . Cerebrovascular accident (CVA) ([place]) 08/09/2016  . CHF (congestive heart failure), NYHA class II, chronic, diastolic ([place])    Echo 2951 LV with mild concentric hypertrophy, EF 55-60%, no regional wall motion abnormalities  . Chronic atrial fibrillation 05/16/2016  . Chronic ITP (idiopathic thrombocytopenia) (H[facility])   . Chronic kidney disease, stage 4 (severe) ([place]) 05/15/2019  . Congestive heart failure with left ventricular diastolic dysfunction ([place]) 05/25/2011   Overview:  TTE 02/02/12: Moderate focal basal hypertrophy of the ventricular septum. EF 55-60%. Grade 1 diastolic dysfunction. Mildly dilated  LA and RA. Increased thickness of the atrial septum c/w lipomatous hypertrophy.   . Coronary artery disease involving coronary bypass graft with angina pectoris ([place]) 05/25/2011   Overview:  Cardiac cath 03/21/08: Mild-moderate reduction in LV function with an  anteroapical wall motion abnormality. Total occlusion of the mid LAD with successful percutaneous angioplasty and stenting using a non drug eluting stent. Successful stenting of a moderate proximal lesion of the LAD. Successful angioplasty of distal LAD stenosis. Total occlusion of the RCA with recollateralization of t  . Coronary atherosclerosis of native coronary artery    a. Anterior STEMI 2009 s/p BMSx2 to mid & prox LAD and angioplasty to distal LAD. total RCA with collaterals, moderate Cx plaquing. a. EF 55-60% in 2013.  [name] Degenerative disk disease 12/20/2010   Sees Dr. Eddie Dibbles.  Has had MRI.   [name] Elevated troponin   . Enlarged prostate with lower urinary tract symptoms (LUTS) 06/09/2011  . GERD (gastroesophageal reflux disease) 01/11/2019  . Gross hematuria   . Gross hematuria 11/18/2008   Qualifier: Diagnosis of  By: Owens Shark, RN, BSN, [name]    . Hemoptysis 05/16/2016  . History of GI bleed 03/17/2018   Admitted 03/17/18 with acute GI bleeding. EGD showed gastritis but no active bleeding. Aspirin stopped and Plavix and Xareltro continued.   [name] HTN (hypertension)   . Hyperlipidemia 04/23/2013  . Microscopic hematuria 09/28/2014  . Myocardial infarct ([place]) 03/21/2008  . NSTEMI (non-ST elevated myocardial infarction) ([place]) 05/16/2016  . NSVT (nonsustained ventricular tachycardia) ([place])    a. Per DC summary from time of STEMI 2009.  . Orthostatic hypotension 11/26/2016  . PAD (peripheral artery disease) ([place]) 08/13/2016  . PAF (paroxysmal atrial fibrillation) ([place])   . Paroxysmal atrial flutter ([place])    a. Abnl EKG 01/2012 concerning for atrial flutter, event monitor showed sinus bradycardia only and no pauses. Not felt to be a candidate for longterm anticoag due to hematuria.  . Persistent atrial fibrillation ([place])   . Phlebitis and thrombophlebitis of superficial vessels of lower extremities   . Pure hypercholesterolemia    a. Has not tolerated statins in the past.  . Right leg DVT ([place])    a. Dx  01/2014.  [name] Sinus bradycardia    a. By prior event monitor.  . Sinus bradycardia    a. By prior event monitor.   . Thrombocytopenia ([place])   . Urinary retention 06/20/2011    Past Surgical History: Past Surgical History:  Procedure Laterality Date  . CARPAL TUNNEL RELEASE    . CORONARY STENT INTERVENTION N/A 03/06/2018   Procedure: CORONARY STENT INTERVENTION;  Surgeon: Lorretta Harp, MD;  Location: [facility];  Service: Cardiovascular;  Laterality: N/A;  . CORONARY STENT PLACEMENT  2009  . ESOPHAGOGASTRODUODENOSCOPY (EGD) WITH PROPOFOL N/A 03/19/2018   Procedure: ESOPHAGOGASTRODUODENOSCOPY (EGD) WITH PROPOFOL;  Surgeon: Wonda Horner, MD;  [facility];  Service: Endoscopy;  Laterality: N/A;  . LEFT HEART CATH AND CORONARY ANGIOGRAPHY N/A 03/06/2018   Procedure: LEFT HEART CATH AND CORONARY ANGIOGRAPHY;  Surgeon: Lorretta Harp, MD;  Location: [facility];  Service: Cardiovascular;  Laterality: N/A;  . REPLACEMENT TOTAL KNEE BILATERAL     Left 2014 (Dr. Eddie Dibbles); right 2012 (Dr. Redmond Pulling)  . trigger finger surgery      Social History: Social History   Tobacco Use  . Smoking status: Never Smoker  . Smokeless tobacco: Never Used  Substance Use Topics  . Alcohol use: No  . Drug use:  No   Additional social history:  Please also refer to relevant sections of EMR.  Family History: Family History  Problem Relation Age of Onset  . Cancer Sister   . Cancer Sister     Allergies and Medications: Allergies  Allergen Reactions  . Prednisone Other (See Comments)    "Made me not feel right when I took it"  . Rosuvastatin Other (See Comments)    "Makes me ache"   No current facility-administered medications on file prior to encounter.    Current Outpatient Medications on File Prior to Encounter  Medication Sig Dispense Refill  . clopidogrel (PLAVIX) 75 MG tablet TAKE 1 TABLET (75 MG TOTAL) BY MOUTH DAILY WITH BREAKFAST. 90 tablet 3  . famotidine  (PEPCID) 20 MG tablet TAKE 1 TABLET BY MOUTH TWICE A DAY (Patient taking differently: Take 20 mg by mouth 2 (two) times daily. ) 60 tablet 5  . fluticasone (FLONASE) 50 MCG/ACT nasal spray Place 2 sprays into both nostrils daily as needed for allergies or rhinitis. 16 g 3  . furosemide (LASIX) 40 MG tablet Take 1 tablet (40 mg total) by mouth daily. 30 tablet 11  . gabapentin (NEURONTIN) 300 MG capsule Take 300 mg by mouth at bedtime.    [name] loratadine (CLARITIN) 10 MG tablet Take 10 mg by mouth daily as needed for allergies.     . methocarbamol (ROBAXIN) 500 MG tablet Take 500 mg by mouth 2 (two) times daily as needed for muscle spasms.     . metoprolol tartrate (LOPRESSOR) 25 MG tablet Take 0.5 tablets (12.5 mg total) by mouth 2 (two) times daily. 30 tablet 11  . nitroGLYCERIN (NITROSTAT) 0.4 MG SL tablet Place 0.4 mg under the tongue every 5 (five) minutes as needed for chest pain.    . pantoprazole (PROTONIX) 40 MG tablet Take 1 tablet (40 mg total) by mouth 2 (two) times daily. 180 tablet 1  . Polyethyl Glyc-Propyl Glyc PF (SYSTANE ULTRA PF) 0.4-0.3 % SOLN Place 1-2 drops into both eyes every morning.    . potassium chloride SA (K-DUR) 20 MEQ tablet Take 20 mEq by mouth daily.    . pravastatin (PRAVACHOL) 20 MG tablet Take 1 tablet (20 mg total) by mouth daily at 6 PM. 90 tablet 1  . pregabalin (LYRICA) 75 MG capsule Take 1 capsule (75 mg total) by mouth 2 (two) times daily. 60 capsule 0  . Rivaroxaban (XARELTO) 15 MG TABS tablet Take 1 tablet (15 mg total) by mouth daily with supper. 30 tablet 6  . senna (SENOKOT) 8.6 MG TABS tablet Take 1 tablet (8.6 mg total) by mouth daily as needed for mild constipation. 30 each 0   Objective: BP 114/70   Pulse 80   Temp 97.6 F (36.4 C) (Oral)   Resp 11   Ht 5\' 11"  (1.803 m)   Wt 80.1 kg   SpO2 98%   BMI 24.63 kg/m   Exam:  GEN:     alert, hard of hearing elderly male, in no acute distress   HENT:  mucus membranes moist, oropharynx without  lesions or erythema EYES:   pupils equal and reactive, EOM intact NECK:  supple, normal ROM RESP:  clear to auscultation bilaterally, no increased work of breathing  CVS:  Irregularly irregular rhythm, normal rate, no appreciable murmurs, distal pulses intact  ABD:  soft, non-tender; bowel sounds present; no palpable masses EXT:   normal ROM, no lower extremity edema NEURO: Alert and oriented to person and place,  gross sensation intact, speech normal Skin:  Pale, bilateral extremity venous stasis dermatitis  Labs and Imaging: CBC BMET  Recent Labs  Lab 05/15/19 1049  WBC 6.0  HGB 6.7*  HCT 24.3*  PLT 125*   Recent Labs  Lab 05/15/19 1049  NA 138  K 4.0  CL 100  CO2 24  BUN 50*  CREATININE 2.12*  GLUCOSE 193*  CALCIUM 9.5     EKG: Heart rate 68, atrial fibrillation  Imaging No results found.   Lyndee Hensen, MD 05/15/2019, 5:28 PM PGY-1, [place] Intern pager: [phone], text pages welcome   FPTS Upper-Level Resident Addendum I have independently interviewed and examined the patient. I have discussed the above with the original author and agree with their documentation. My edits for correction/addition/clarification are in blue. Please see also any attending notes.    Milus Banister, DO PGY-2, [facility] 05/15/2019 6:45 PM  [place] Service pager: [phone] (text pages welcome through [place])

## 2019-05-15 NOTE — Telephone Encounter (Signed)
New Message  Pt c/o Shortness Of Breath: STAT if SOB developed within the last 24 hours or pt is noticeably SOB on the phone  1. Are you currently SOB (can you hear that pt is SOB on the phone)?  Patients daughter states that he has been short of breath since yesterday. Spoke with daughter and not patient  2. How long have you been experiencing SOB? Since yesterday  3. Are you SOB when sitting or when up moving around? Moving around  4. Are you currently experiencing any other symptoms? Patients daughter says that his left foot was swollen on yesterday and one of the lesion is running.

## 2019-05-16 ENCOUNTER — Encounter (HOSPITAL_COMMUNITY): Payer: Self-pay

## 2019-05-16 ENCOUNTER — Other Ambulatory Visit: Payer: Self-pay

## 2019-05-16 DIAGNOSIS — K922 Gastrointestinal hemorrhage, unspecified: Principal | ICD-10-CM

## 2019-05-16 DIAGNOSIS — D649 Anemia, unspecified: Secondary | ICD-10-CM

## 2019-05-16 LAB — TYPE AND SCREEN
ABO/RH(D): A POS
Antibody Screen: NEGATIVE
Unit division: 0
Unit division: 0

## 2019-05-16 LAB — BASIC METABOLIC PANEL
Anion gap: 9 (ref 5–15)
BUN: 45 mg/dL — ABNORMAL HIGH (ref 8–23)
CO2: 28 mmol/L (ref 22–32)
Calcium: 9.2 mg/dL (ref 8.9–10.3)
Chloride: 107 mmol/L (ref 98–111)
Creatinine, Ser: 2.07 mg/dL — ABNORMAL HIGH (ref 0.61–1.24)
GFR calc Af Amer: 31 mL/min — ABNORMAL LOW (ref >60)
GFR calc non Af Amer: 27 mL/min — ABNORMAL LOW (ref >60)
Glucose, Bld: 115 mg/dL — ABNORMAL HIGH (ref 70–99)
Potassium: 4.1 mmol/L (ref 3.5–5.1)
Sodium: 144 mmol/L (ref 135–145)

## 2019-05-16 LAB — CBC
HCT: 27.7 % — ABNORMAL LOW (ref 39.0–52.0)
HCT: 30 % — ABNORMAL LOW (ref 39.0–52.0)
Hemoglobin: 8.3 g/dL — ABNORMAL LOW (ref 13.0–17.0)
Hemoglobin: 9 g/dL — ABNORMAL LOW (ref 13.0–17.0)
MCH: 22.4 pg — ABNORMAL LOW (ref 26.0–34.0)
MCH: 22.7 pg — ABNORMAL LOW (ref 26.0–34.0)
MCHC: 30 g/dL (ref 30.0–36.0)
MCHC: 30 g/dL (ref 30.0–36.0)
MCV: 74.9 fL — ABNORMAL LOW (ref 80.0–100.0)
MCV: 75.8 fL — ABNORMAL LOW (ref 80.0–100.0)
Platelets: 110 10*3/uL — ABNORMAL LOW (ref 150–400)
Platelets: 121 10*3/uL — ABNORMAL LOW (ref 150–400)
RBC: 3.7 MIL/uL — ABNORMAL LOW (ref 4.22–5.81)
RBC: 3.96 MIL/uL — ABNORMAL LOW (ref 4.22–5.81)
RDW: 19.9 % — ABNORMAL HIGH (ref 11.5–15.5)
RDW: 20 % — ABNORMAL HIGH (ref 11.5–15.5)
WBC: 6 10*3/uL (ref 4.0–10.5)
WBC: 6.7 10*3/uL (ref 4.0–10.5)
nRBC: 0 % (ref 0.0–0.2)
nRBC: 0 % (ref 0.0–0.2)

## 2019-05-16 LAB — BPAM RBC
Blood Product Expiration Date: 202011302359
Blood Product Expiration Date: 202011302359
ISSUE DATE / TIME: 202011031406
ISSUE DATE / TIME: 202011032325
Unit Type and Rh: 6200
Unit Type and Rh: 6200

## 2019-05-16 NOTE — Consult Note (Signed)
Referring Provider:  Family Medicine Teaching Service         Primary Care Physician:  Leeanne Rio, MD Primary Gastroenterologist: Althia Forts            Reason for Consultation:    anemia               ASSESSMENT /  PLAN    38. 85 yo male with ITP / CAD / stent / ischemic cardiomyopathy, chronic systolic heart failure / AFIB / hx of DVT, CKD4.  On chronic plavix and Xarelto.   2. Heme positive acute on chronic anemia on plavix and xarelto. No overt GI bleeding. No focal GI symptoms.  -Given advanced age, co-morbidities will start with an EGD tomorrow.to rule out upper source of occult bleeding. If this is negative then CT AP (oral contrast only) to rule out any gross colon lesions. He is at increased risk for procedures, especially colonoscopy. Last Plavix dose was yesterday. Both plavix and Xarelto are on hold   HPI:     [name] is a 85 y.o. male with multiple medical problems as listed above.[name] He presented to ED yesterday with lightheadedness, near syncope. He was found to have a hgb of 6.7, down from baseline of ~10. He is heme positive. Per daughter, patient keeps a log of every BM. She read log and there was no mention of any dark stools nor blood in stool. No bowel changes.  Except for one time yesterday patient hasn't complained of abdominal pain. Yesterday he had transient RUQ pain. No nausea, vomiting. He doesn't take NSAIDs.   Patient was admitted Sept 2019 with melena / anemia and presyncope. He underwent EGD by [facility] with findings of acute gastritis, no biopsies done. Patient takes BID Pepcid at home.    Past Medical History:  Diagnosis Date  . Abnormal CT of the chest 07/21/2016   January 2018 IMPRESSION: 1. Residual patchy ground-glass opacities throughout both lungs at the resolved areas of consolidation from the 05/16/2016 chest CT, consistent with a resolving infectious or inflammatory process. 2. Bandlike opacity in the anterior right upper lobe  is new since 05/16/2016, was probably present on 06/17/2016 chest radiograph, favor evolving postinfectious/postinflammatory   . Allergic rhinitis 01/11/2019  . Atrial arrhythmia 04/23/2013   Overview:  Abnormal ekg July 2013 c/f atrial flutter. Seen in the EP clinic. Event monitor showed sinus brady only with no pauses. Not felt to be a candidate for long term anti-coagulation 2/2 hematuria.   . Benign prostate hyperplasia 10/04/2011  . Bladder neck obstruction 10/04/2011  . Cerebrovascular accident (CVA) ([place]) 08/09/2016  . CHF (congestive heart failure), NYHA class II, chronic, diastolic ([place])    Echo 3354 LV with mild concentric hypertrophy, EF 55-60%, no regional wall motion abnormalities  . Chronic atrial fibrillation 05/16/2016  . Chronic ITP (idiopathic thrombocytopenia) (HCC)   . Chronic kidney disease, stage 4 (severe) ([place]) 05/15/2019  . Congestive heart failure with left ventricular diastolic dysfunction ([place]) 05/25/2011   Overview:  TTE 02/02/12: Moderate focal basal hypertrophy of the ventricular septum. EF 55-60%. Grade 1 diastolic dysfunction. Mildly dilated LA and RA. Increased thickness of the atrial septum c/w lipomatous hypertrophy.   . Coronary artery disease involving coronary bypass graft with angina pectoris ([place]) 05/25/2011   Overview:  Cardiac cath 03/21/08: Mild-moderate reduction in LV function with an anteroapical wall motion abnormality. Total occlusion of the mid LAD with successful percutaneous angioplasty and stenting using a non drug eluting stent. Successful  stenting of a moderate proximal lesion of the LAD. Successful angioplasty of distal LAD stenosis. Total occlusion of the RCA with recollateralization of t  . Coronary atherosclerosis of native coronary artery    a. Anterior STEMI 2009 s/p BMSx2 to mid & prox LAD and angioplasty to distal LAD. total RCA with collaterals, moderate Cx plaquing. a. EF 55-60% in 2013.  [name] Degenerative disk disease 12/20/2010   Sees Dr. Eddie Dibbles.   Has had MRI.   [name] Elevated troponin   . Enlarged prostate with lower urinary tract symptoms (LUTS) 06/09/2011  . GERD (gastroesophageal reflux disease) 01/11/2019  . Gross hematuria   . Gross hematuria 11/18/2008   Qualifier: Diagnosis of  By: Owens Shark, RN, BSN, [name]    . Hemoptysis 05/16/2016  . History of GI bleed 03/17/2018   Admitted 03/17/18 with acute GI bleeding. EGD showed gastritis but no active bleeding. Aspirin stopped and Plavix and Xareltro continued.   [name] HTN (hypertension)   . Hyperlipidemia 04/23/2013  . Microscopic hematuria 09/28/2014  . Myocardial infarct ([place]) 03/21/2008  . NSTEMI (non-ST elevated myocardial infarction) ([place]) 05/16/2016  . NSVT (nonsustained ventricular tachycardia) ([place])    a. Per DC summary from time of STEMI 2009.  . Orthostatic hypotension 11/26/2016  . PAD (peripheral artery disease) ([place]) 08/13/2016  . PAF (paroxysmal atrial fibrillation) ([place])   . Paroxysmal atrial flutter ([place])    a. Abnl EKG 01/2012 concerning for atrial flutter, event monitor showed sinus bradycardia only and no pauses. Not felt to be a candidate for longterm anticoag due to hematuria.  . Persistent atrial fibrillation ([place])   . Phlebitis and thrombophlebitis of superficial vessels of lower extremities   . Pure hypercholesterolemia    a. Has not tolerated statins in the past.  . Right leg DVT ([place])    a. Dx 01/2014.  [name] Sinus bradycardia    a. By prior event monitor.  . Sinus bradycardia    a. By prior event monitor.   . Thrombocytopenia ([place])   . Urinary retention 06/20/2011    Past Surgical History:  Procedure Laterality Date  . CARPAL TUNNEL RELEASE    . CORONARY STENT INTERVENTION N/A 03/06/2018   Procedure: CORONARY STENT INTERVENTION;  Surgeon: Lorretta Harp, MD;  Location: [facility];  Service: Cardiovascular;  Laterality: N/A;  . CORONARY STENT PLACEMENT  2009  . ESOPHAGOGASTRODUODENOSCOPY (EGD) WITH PROPOFOL N/A 03/19/2018   Procedure: ESOPHAGOGASTRODUODENOSCOPY  (EGD) WITH PROPOFOL;  Surgeon: Wonda Horner, MD;  Location: [facility] ENDOSCOPY;  Service: Endoscopy;  Laterality: N/A;  . LEFT HEART CATH AND CORONARY ANGIOGRAPHY N/A 03/06/2018   Procedure: LEFT HEART CATH AND CORONARY ANGIOGRAPHY;  Surgeon: Lorretta Harp, MD;  Location: [facility];  Service: Cardiovascular;  Laterality: N/A;  . REPLACEMENT TOTAL KNEE BILATERAL     Left 2014 (Dr. Eddie Dibbles); right 2012 (Dr. Redmond Pulling)  . trigger finger surgery      Prior to Admission medications   Medication Sig Start Date End Date Taking? Authorizing Provider  clopidogrel (PLAVIX) 75 MG tablet TAKE 1 TABLET (75 MG TOTAL) BY MOUTH DAILY WITH BREAKFAST. 08/29/18  Yes Burnell Blanks, MD  famotidine (PEPCID) 20 MG tablet TAKE 1 TABLET BY MOUTH TWICE A DAY Patient taking differently: Take 20 mg by mouth 2 (two) times daily.  01/05/19  Yes Leeanne Rio, MD  fluticasone [facility]) 50 MCG/ACT nasal spray Place 2 sprays into both nostrils daily as needed for allergies or rhinitis. 01/11/19  Yes Leeanne Rio, MD  furosemide (LASIX) 40 MG tablet Take 1 tablet (40 mg total) by mouth daily. 01/15/19 01/10/20 Yes Burnell Blanks, MD  gabapentin (NEURONTIN) 300 MG capsule Take 300 mg by mouth at bedtime.   Yes [provider]  loratadine (CLARITIN) 10 MG tablet Take 10 mg by mouth daily as needed for allergies.    Yes [provider]  methocarbamol (ROBAXIN) 500 MG tablet Take 500 mg by mouth 2 (two) times daily as needed for muscle spasms.  05/10/19  Yes [provider]  metoprolol tartrate (LOPRESSOR) 25 MG tablet Take 0.5 tablets (12.5 mg total) by mouth 2 (two) times daily. 05/04/19  Yes Burnell Blanks, MD  nitroGLYCERIN (NITROSTAT) 0.4 MG SL tablet Place 0.4 mg under the tongue every 5 (five) minutes as needed for chest pain.   Yes [provider]  pantoprazole (PROTONIX) 40 MG tablet Take 1 tablet (40 mg total) by mouth 2 (two) times daily. 01/11/19  Yes  Leeanne Rio, MD  Polyethyl Glyc-Propyl Glyc PF (SYSTANE ULTRA PF) 0.4-0.3 % SOLN Place 1-2 drops into both eyes every morning.   Yes [provider]  potassium chloride SA (K-DUR) 20 MEQ tablet Take 20 mEq by mouth daily.   Yes [provider]  pravastatin (PRAVACHOL) 20 MG tablet Take 1 tablet (20 mg total) by mouth daily at 6 PM. 01/11/19  Yes Leeanne Rio, MD  pregabalin (LYRICA) 75 MG capsule Take 1 capsule (75 mg total) by mouth 2 (two) times daily. 05/18/16  Yes Asencion Partridge, MD  Rivaroxaban (XARELTO) 15 MG TABS tablet Take 1 tablet (15 mg total) by mouth daily with supper. 04/30/19  Yes Burnell Blanks, MD  senna (SENOKOT) 8.6 MG TABS tablet Take 1 tablet (8.6 mg total) by mouth daily as needed for mild constipation. 04/14/18  Yes Nuala Alpha, DO    Current Facility-Administered Medications  Medication Dose Route Frequency Provider Last Rate Last Dose  . 0.9 %  sodium chloride infusion (Manually program via Guardrails IV Fluids)   Intravenous Once Kathrene Alu, MD      . acetaminophen (TYLENOL) tablet 650 mg  650 mg Oral Q6H PRN Lyndee Hensen, MD       Or  . acetaminophen (TYLENOL) suppository 650 mg  650 mg Rectal Q6H PRN Lyndee Hensen, MD      . metoprolol tartrate (LOPRESSOR) tablet 12.5 mg  12.5 mg Oral BID Lyndee Hensen, MD   12.5 mg at 05/16/19 1046  . pantoprazole (PROTONIX) injection 40 mg  40 mg Intravenous Q12H Lyndee Hensen, MD   40 mg at 05/16/19 1046    Allergies as of 05/15/2019 - Review Complete 05/15/2019  Allergen Reaction Noted  . Prednisone Other (See Comments) 05/15/2016  . Rosuvastatin Other (See Comments) 03/19/2009    Family History  Problem Relation Age of Onset  . Cancer Sister   . Cancer Sister     Social History   Socioeconomic History  . Marital status: Married    Spouse name: Not on file  . Number of children: Not on file  . Years of education: Not on file  . Highest education level:  Not on file  Occupational History  . Not on file  Social Needs  . Financial resource strain: Not on file  . Food insecurity    Worry: Not on file    Inability: Not on file  . Transportation needs    Medical: Not on file    Non-medical: Not on file  Tobacco  Use  . Smoking status: Never Smoker  . Smokeless tobacco: Never Used  Substance and Sexual Activity  . Alcohol use: No  . Drug use: No  . Sexual activity: Not on file  Lifestyle  . Physical activity    Days per week: Not on file    Minutes per session: Not on file  . Stress: Not on file  Relationships  . Social Herbalist on phone: Not on file    Gets together: Not on file    Attends religious service: Not on file    Active member of club or organization: Not on file    Attends meetings of clubs or organizations: Not on file    Relationship status: Not on file  . Intimate partner violence    Fear of current or ex partner: Not on file    Emotionally abused: Not on file    Physically abused: Not on file    Forced sexual activity: Not on file  Other Topics Concern  . Not on file  Social History Narrative   Lives in [place]. Wife has died. Lives next door to his daughter [name]. Also has a very supportive daughter, [name] who lives out of town. 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 Denies tobacco, alcohol, or drug use ever in lifetime.    Review of Systems: All systems reviewed and negative except where noted in HPI.  Physical Exam: Vital signs in last 24 hours: Temp:  [97.5 F (36.4 C)-98.4 F (36.9 C)] 97.5 F (36.4 C) (11/04 1125) Pulse Rate:  [72-92] 76 (11/04 1125) Resp:  [10-19] 19 (11/04 1125) BP: (106-131)/(54-71) 124/64 (11/04 1125) SpO2:  [92 %-99 %] 98 % (11/04 1125) Weight:  [77.6 kg] 77.6 kg (11/03 2224) Last BM Date: 05/15/19 General:   Alert, well-developed,  White male in NAD Psych:  Pleasant, cooperative. Normal mood and affect. Eyes:  Pupils equal, sclera clear,  no icterus.   Conjunctiva pink. Ears:  Normal auditory acuity. Nose:  No deformity, discharge,  or lesions. Neck:  Supple; no masses Lungs:  Clear throughout to auscultation.   No wheezes, crackles, or rhonchi.  Heart:  Regular rate. No lower extremity edema Abdomen:  Soft, non-distended, nontender, BS active, no palp mass   Rectal:  Deferred  Msk:  Symmetrical without gross deformities. . Neurologic:  Alert and  oriented x4;  grossly normal neurologically. Skin:  Intact without significant lesions or rashes.   Intake/Output from previous day: 11/03 0701 - 11/04 0700 In: 963.5 [I.V.:360.5; Blood:603] Out: 825 [Urine:825] Intake/Output this shift: No intake/output data recorded.  Lab Results: Recent Labs    05/15/19 1049 05/15/19 2123 05/16/19 0414  WBC 6.0 6.8 6.0  HGB 6.7* 7.3* 8.3*  HCT 24.3* 24.6* 27.7*  PLT 125* 114* 110*   BMET Recent Labs    05/15/19 1049 05/16/19 0414  NA 138 144  K 4.0 4.1  CL 100 107  CO2 24 28  GLUCOSE 193* 115*  BUN 50* 45*  CREATININE 2.12* 2.07*  CALCIUM 9.5 9.2     . CBC Latest Ref Rng & Units 05/16/2019 05/15/2019 05/15/2019  WBC 4.0 - 10.5 K/uL 6.0 6.8 6.0  Hemoglobin 13.0 - 17.0 g/dL 8.3(L) 7.3(L) 6.7(LL)  Hematocrit 39.0 - 52.0 % 27.7(L) 24.6(L) 24.3(L)  Platelets 150 - 400 K/uL 110(L) 114(L) 125(L)    . CMP Latest Ref Rng & Units 05/16/2019 05/15/2019 01/26/2019  Glucose 70 - 99 mg/dL 115(H) 193(H) 157(H)  BUN 8 -  23 mg/dL 45(H) 50(H) 32  Creatinine 0.61 - 1.24 mg/dL 2.07(H) 2.12(H) 1.98(H)  Sodium 135 - 145 mmol/L 144 138 143  Potassium 3.5 - 5.1 mmol/L 4.1 4.0 4.0  Chloride 98 - 111 mmol/L 107 100 101  CO2 22 - 32 mmol/L 28 24 24   Calcium 8.9 - 10.3 mg/dL 9.2 9.5 9.4  Total Protein 6.5 - 8.1 g/dL - - -  Total Bilirubin 0.3 - 1.2 mg/dL - - -  Alkaline Phos 38 - 126 U/L - - -  AST 15 - 41 U/L - - -  ALT 0 - 44 U/L - - -   Studies/Results: No results found.  Principal Problem:   Acute anemia Active Problems:    Coronary atherosclerosis of native coronary artery   HTN (hypertension)   Chronic ITP (idiopathic thrombocytopenia) (HCC)   Persistent atrial fibrillation (HCC)   Congestive heart failure with left ventricular diastolic dysfunction (HCC)   History of GI bleed   GERD (gastroesophageal reflux disease)   Chronic kidney disease, stage 4 (severe) ([place])   GI bleed   Symptomatic anemia    Tye Savoy, NP-C @  05/16/2019, 4:49 PM

## 2019-05-16 NOTE — Progress Notes (Signed)
RN paged [facility] and [facility] per pt's daughter request. Her phone number is in the chart for daily updates from MD.

## 2019-05-16 NOTE — Progress Notes (Signed)
Family Medicine Teaching Service Daily Progress Note Intern Pager: [phone]  Patient name: [name] Medical record number: [id number] Date of birth: 04/20/1924 Age: 85 y.o. Gender: male  Primary Care Provider: Leeanne Rio, MD Consultants: GI Code Status: FULL  [name]:  05/15/19: Admitted, [facility] consult  05/16/19: [facility] consulted   Assessment and Plan:  Symptomatic anemia  Near-syncope  Iron deficiency anemia Patient sitting upright in chair this morning without complaints.  Hemoglobin 8.3 status post 2 units PRBCs from 6.7 on admission.  Patient continues to deny any blood with urination and stooling.  He has not been short of breath, having chest pain, lightheaded, nausea or vomiting.  [facility] was consulted however practice states the patient did not establish care with them.  [facility] was consulted.  Iron studies indicate iron deficiency anemia. -GI consulted, appreciate recommendations -Every 12 hours CBC -Regular diet until GI evaluation -Vitals every 4 hours -PT/OT eval and treat -SCDs for DVT prophylaxis -Continue holding home Xarelto and Plavix  Atrial fibrillation, chronic, stable Patient takes Lopressor 12.5 mg BID, Xarelto 15 mg daily. - Cardiac monitoring - Continue Lopressor - Hold Xarelto and Plavix, restart per GI recs  Hypertension Normotensive. Home medications include Lopressor 12.5 mg twice daily and Lasix 40mg  daily.  - Continue home Lopressor, IV Lasix 20 mg - Monitor blood pressure  HFrEF  Chronic venous stasis lower extremity dermatitis, stable History of combined systolic and diastolic heart failure. Most recent [facility] August 2019 showing EF of 30-35% with diffuse hypokinesis. Home medications include Lasix 40 mg daily, Lopressor 12.5mg  BID and K-Dur 20 mg daily. Patient with history of right lower extremity DVT.  -IVLasix 20 mg -Replete potassium as needed -Continue metoprolol as  above  CKD stage IV Baseline creatinine around 1.9 - 2.  -Daily BMP -Avoid nephrotoxic agents  Chronic ITP  Platelets today 110.    Per chart review, patient is chronically thrombocytopenic. -CBC  Chronic Back Pain Has degenerative disc disease. Takes Roxabin 500 mg twice daily, 75 mg Lyrica twice daily -Hold home Lyrica and Roxabin   CVA  Patient had CVA in Jan 2018.  Patient taking Plavix 75 mg daily, pravastatin 20 mg bedtime.  Patient and daughter report ongoing perceived decreased sensation bilateral hands. -Hold home medications   GERD Patient taking famotidine 20 mg twice daily and Protonix 40 mg twice daily.  -Hold home medications  -IV Protonix 40 mg twice daily   FEN/GI:  Regular, replete electrolytes as needed Prophylaxis: SCDs, Protonix   Disposition: Pending medical stabilization,   Subjective:  No significant overnight events.  Patient with no complaints this morning.  Objective: Temp:  [97.5 F (36.4 C)-98.4 F (36.9 C)] 97.5 F (36.4 C) (11/04 1125) Pulse Rate:  [72-92] 76 (11/04 1125) Resp:  [10-19] 19 (11/04 1125) BP: (101-131)/(54-71) 124/64 (11/04 1125) SpO2:  [92 %-99 %] 98 % (11/04 1125) Weight:  [77.6 kg] 77.6 kg (11/03 2224)    Physical Exam: General: Alert elderly male was hard of hearing, in no acute distress Cardiovascular: Irregularly irregular rhythm normal rate, distal pulses intact Respiratory: Clear to auscultation bilaterally, no increased work of breathing Abdomen: Soft, nontender nondistended Extremities: Chronic venous stasis dermatitis, no appreciable lower extremity edema  Laboratory: Recent Labs  Lab 05/15/19 1049 05/15/19 2123 05/16/19 0414  WBC 6.0 6.8 6.0  HGB 6.7* 7.3* 8.3*  HCT 24.3* 24.6* 27.7*  PLT 125* 114* 110*   Recent Labs  Lab 05/15/19 1049  05/16/19 0414  NA 138 144  K 4.0 4.1  CL 100 107  CO2 24 28  BUN 50* 45*  CREATININE 2.12* 2.07*  CALCIUM 9.5 9.2  GLUCOSE 193* 115*       Imaging/Diagnostic Tests: No results found.   Lyndee Hensen, MD 05/16/2019, 3:36 PM PGY-1, [place] Intern pager: [phone], text pages welcome

## 2019-05-17 ENCOUNTER — Encounter (HOSPITAL_COMMUNITY)
Admission: EM | Disposition: A | Discharge status: Home / Self Care | Payer: Self-pay | Source: Home / Self Care | Attending: Family Medicine

## 2019-05-17 ENCOUNTER — Inpatient Hospital Stay (HOSPITAL_COMMUNITY): Payer: Medicare Other | Admitting: Certified Registered"

## 2019-05-17 ENCOUNTER — Inpatient Hospital Stay (HOSPITAL_COMMUNITY): Payer: Medicare Other

## 2019-05-17 DIAGNOSIS — K2970 Gastritis, unspecified, without bleeding: Secondary | ICD-10-CM

## 2019-05-17 DIAGNOSIS — K2990 Gastroduodenitis, unspecified, without bleeding: Secondary | ICD-10-CM

## 2019-05-17 HISTORY — PX: BIOPSY: SHX5522

## 2019-05-17 HISTORY — PX: ESOPHAGOGASTRODUODENOSCOPY (EGD) WITH PROPOFOL: SHX5813

## 2019-05-17 LAB — CBC
HCT: 30.1 % — ABNORMAL LOW (ref 39.0–52.0)
Hemoglobin: 8.9 g/dL — ABNORMAL LOW (ref 13.0–17.0)
MCH: 22.4 pg — ABNORMAL LOW (ref 26.0–34.0)
MCHC: 29.6 g/dL — ABNORMAL LOW (ref 30.0–36.0)
MCV: 75.8 fL — ABNORMAL LOW (ref 80.0–100.0)
Platelets: 133 10*3/uL — ABNORMAL LOW (ref 150–400)
RBC: 3.97 MIL/uL — ABNORMAL LOW (ref 4.22–5.81)
RDW: 20.8 % — ABNORMAL HIGH (ref 11.5–15.5)
WBC: 6.7 10*3/uL (ref 4.0–10.5)
nRBC: 0 % (ref 0.0–0.2)

## 2019-05-17 LAB — BASIC METABOLIC PANEL
Anion gap: 11 (ref 5–15)
BUN: 42 mg/dL — ABNORMAL HIGH (ref 8–23)
CO2: 26 mmol/L (ref 22–32)
Calcium: 9.1 mg/dL (ref 8.9–10.3)
Chloride: 104 mmol/L (ref 98–111)
Creatinine, Ser: 1.97 mg/dL — ABNORMAL HIGH (ref 0.61–1.24)
GFR calc Af Amer: 33 mL/min — ABNORMAL LOW (ref >60)
GFR calc non Af Amer: 29 mL/min — ABNORMAL LOW (ref >60)
Glucose, Bld: 121 mg/dL — ABNORMAL HIGH (ref 70–99)
Potassium: 3.6 mmol/L (ref 3.5–5.1)
Sodium: 141 mmol/L (ref 135–145)

## 2019-05-17 SURGERY — ESOPHAGOGASTRODUODENOSCOPY (EGD) WITH PROPOFOL
Anesthesia: Monitor Anesthesia Care

## 2019-05-17 MED ORDER — SODIUM CHLORIDE 0.9 % IV SOLN
INTRAVENOUS | Status: DC
  Administered 2019-05-17: 10:00:00 via INTRAVENOUS

## 2019-05-17 MED ORDER — PHENYLEPHRINE HCL (PRESSORS) 10 MG/ML IV SOLN
INTRAVENOUS | Status: DC | PRN
  Administered 2019-05-17: 40 ug via INTRAVENOUS

## 2019-05-17 MED ORDER — PROPOFOL 10 MG/ML IV BOLUS
INTRAVENOUS | Status: DC | PRN
  Administered 2019-05-17: 10 mg via INTRAVENOUS
  Administered 2019-05-17: 20 mg via INTRAVENOUS
  Administered 2019-05-17 (×2): 10 mg via INTRAVENOUS
  Administered 2019-05-17: 20 mg via INTRAVENOUS
  Administered 2019-05-17: 10 mg via INTRAVENOUS

## 2019-05-17 MED ORDER — SODIUM CHLORIDE 0.9 % IV SOLN
INTRAVENOUS | Status: DC | PRN
  Administered 2019-05-17: 11:00:00 via INTRAVENOUS

## 2019-05-17 SURGICAL SUPPLY — 15 items

## 2019-05-17 NOTE — Progress Notes (Signed)
   05/17/19 1000  OT Visit Information  Last OT Received On 05/17/19  Reason Eval/Treat Not Completed Patient at procedure or test/ unavailable   Plan to reattempt at a later time/date.  Tyrone Schimke, OT Acute Rehabilitation Services Pager: [phone] Office: [phone]

## 2019-05-17 NOTE — Progress Notes (Signed)
Family Medicine Teaching Service Daily Progress Note Intern Pager: [phone]  Patient name: [name] Medical record number: [id number] Date of birth: [birth year] Age: 85 y.o. Gender: male  Primary Care Provider: Leeanne Rio, MD Consultants: GI Code Status: FULL  [name]:  05/15/19: Admitted, [facility] consult  05/16/19: [facility] consulted   Assessment and Plan:  Symptomatic anemia  Near-syncope  Iron deficiency anemia  Hemoglobin 8.9, continues to be stable s/p 2 units pRBCs.  Studies indicated iron deficiency anemia.  He will likely need iron supplementation in the outpatient setting.  GI performed EGD this morning that did not show source of anemia howover did indicate to small gastric erosions.  Patient is too high risk for colonoscopy.  Obtain CT abdomen to assess for colon abnormalities.  Per conversation with daughter yesterday, patient is back at baseline.  He continues to deny any blood with urination and stooling.  He has not been short of breath or has chest pain nausea or vomiting or lightheadedness with movement.  Per GI patient can resume Xarelto and Plavix tomorrow. -GI following, appreciate recommendations -Daily CBC / BMP -Resume regular diet -Vitals per routine -PT/OT eval and treat -SCDs for DVT prophylaxis -Continue holding home Xarelto and Plavix, resume tomorrow  Atrial fibrillation, chronic, stable Patient takes Lopressor 12.5 mg BID, Xarelto 15 mg daily. - Cardiac monitoring - Continue Lopressor - Hold Xarelto and Plavix,  resume tomorrow  Hypertension Normotensive 108/91. Home medications include Lopressor 12.5 mg twice daily and Lasix 40mg  daily.  - Continue home Lopressor, IV Lasix 20 mg - Monitor blood pressure  HFrEF  Chronic venous stasis lower extremity dermatitis, stable History of combined systolic and diastolic heart failure. Most recent [facility] August 2019 showing EF of 30-35% with  diffuse hypokinesis. Home medications include Lasix 40 mg daily, Lopressor 12.5mg  BID and K-Dur 20 mg daily. Patient with history of right lower extremity DVT.  -IVLasix 20 mg -Replete potassium as needed. K 3.6 today -Continue metoprolol as above  CKD stage IV Baseline creatinine around 1.9 - 2.   Creatinine 1.97 today. -Daily BMP -Avoid nephrotoxic agents  Chronic ITP  Platelets today 133.    Per chart review, patient is chronically thrombocytopenic. -CBC  Chronic Back Pain Has degenerative disc disease. Takes Roxabin 500 mg twice daily, 75 mg Lyrica twice daily -Hold home Lyrica and Roxabin   CVA  Patient had CVA in Jan 2018.  Patient taking Plavix 75 mg daily, pravastatin 20 mg bedtime.  Patient and daughter report ongoing perceived decreased sensation bilateral hands. -Hold home medications   GERD Patient taking famotidine 20 mg twice daily and Protonix 40 mg twice daily.  -Hold home medications  -IV Protonix 40 mg twice daily   FEN/GI:  Regular, replete electrolytes as needed Prophylaxis: SCDs, Protonix   Disposition: Pending medical stabilization, likely home   Subjective:  [name] has no complaints.  No significant overnight events  Objective: Temp:  [97.5 F (36.4 C)-98 F (36.7 C)] 97.6 F (36.4 C) (11/05 0500) Pulse Rate:  [72-85] 85 (11/05 0800) Resp:  [16-19] 16 (11/05 0500) BP: (101-124)/(51-91) 108/91 (11/05 0800) SpO2:  [92 %-98 %] 92 % (11/05 0800) Weight:  [76.4 kg] 76.4 kg (11/05 0500)    Physical Exam: GEN: pleasant male, in no acute distress CV: Irregularly irregular rhythm, normal rate, no murmurs appreciated RESP: no increased work of breathing, clear to ascultation bilaterally  ABD: Bowel sounds present. soft, nontender, nondistended.  MSK:  no lower extremity edema, or cyanosis noted SKIN: warm, dry NEURO: Alert, orientation at baseline, moves all extremities appropriately    Laboratory: Recent Labs  Lab  05/15/19 2123 05/16/19 0414 05/16/19 1809  WBC 6.8 6.0 6.7  HGB 7.3* 8.3* 9.0*  HCT 24.6* 27.7* 30.0*  PLT 114* 110* 121*   Recent Labs  Lab 05/15/19 1049 05/16/19 0414 05/17/19 0508  NA 138 144 141  K 4.0 4.1 3.6  CL 100 107 104  CO2 24 28 26   BUN 50* 45* 42*  CREATININE 2.12* 2.07* 1.97*  CALCIUM 9.5 9.2 9.1  GLUCOSE 193* 115* 121*      Imaging/Diagnostic Tests: No results found.   Alan Hensen, MD 05/17/2019, 8:23 AM PGY-1, [name] Intern pager: [phone], text pages welcome

## 2019-05-17 NOTE — Progress Notes (Signed)
Pt in endoscopy and will reattempt as time and pt allow.   05/17/19 1100  PT Visit Information  Last PT Received On 05/17/19  Reason Eval/Treat Not Completed Patient at procedure or test/unavailable    Mee Hives, PT MS Acute Rehab Dept. Number: [place] [facility] [place]

## 2019-05-17 NOTE — Anesthesia Preprocedure Evaluation (Addendum)
Anesthesia Evaluation  Patient identified by MRN, date of birth, ID band Patient awake    Reviewed: Allergy & Precautions, NPO status , Patient's Chart, lab work & pertinent test results  History of Anesthesia Complications Negative for: history of anesthetic complications  Airway Mallampati: II  TM Distance: >3 FB     Dental no notable dental hx. (+) Dental Advisory Given   Pulmonary pneumonia,    breath sounds clear to auscultation       Cardiovascular hypertension, Pt. on medications and Pt. on home beta blockers + angina + CAD, + Past MI, + Peripheral Vascular Disease and +CHF  + dysrhythmias Atrial Fibrillation  Rhythm:Regular Rate:Normal     Neuro/Psych CVA, Residual Symptoms    GI/Hepatic Neg liver ROS, GERD  ,  Endo/Other    Renal/GU Renal InsufficiencyRenal disease     Musculoskeletal  (+) Arthritis ,   Abdominal   Peds  Hematology  (+) anemia ,   Anesthesia Other Findings   Reproductive/Obstetrics                            Anesthesia Physical  Anesthesia Plan  ASA: III  Anesthesia Plan: MAC   Post-op Pain Management:    Induction: Intravenous  PONV Risk Score and Plan: Treatment may vary due to age or medical condition and Propofol infusion  Airway Management Planned: Simple Face Mask, Nasal Cannula and Natural Airway  Additional Equipment:   Intra-op Plan:   Post-operative Plan:   Informed Consent: I have reviewed the patients History and Physical, chart, labs and discussed the procedure including the risks, benefits and alternatives for the proposed anesthesia with the patient or authorized representative who has indicated his/her understanding and acceptance.     Dental advisory given  Plan Discussed with: CRNA and Anesthesiologist  Anesthesia Plan Comments:        Anesthesia Quick Evaluation

## 2019-05-17 NOTE — Transfer of Care (Signed)
Immediate Anesthesia Transfer of Care Note  Patient: [name]  Procedure(s) Performed: ESOPHAGOGASTRODUODENOSCOPY (EGD) WITH PROPOFOL (N/A ) BIOPSY  Patient Location: Endoscopy Unit  Anesthesia Type:MAC  Level of Consciousness: awake, alert  and oriented  Airway & Oxygen Therapy: Patient Spontanous Breathing  Post-op Assessment: Report given to RN, Post -op Vital signs reviewed and stable and Patient moving all extremities X 4  Post vital signs: Reviewed and stable  Last Vitals:  Vitals Value Taken Time  BP 118/58 05/17/19 1128  Temp 36.6 C 05/17/19 1128  Pulse 76 05/17/19 1131  Resp 14 05/17/19 1131  SpO2 100 % 05/17/19 1131  Vitals shown include unvalidated device data.  Last Pain:  Vitals:   05/17/19 1128  TempSrc: Oral  PainSc: 0-No pain         Complications: No apparent anesthesia complications

## 2019-05-17 NOTE — Anesthesia Postprocedure Evaluation (Signed)
Anesthesia Post Note  Patient: [name]  Procedure(s) Performed: ESOPHAGOGASTRODUODENOSCOPY (EGD) WITH PROPOFOL (N/A ) BIOPSY     Patient location during evaluation: Endoscopy Anesthesia Type: MAC Level of consciousness: awake and alert Pain management: pain level controlled Vital Signs Assessment: post-procedure vital signs reviewed and stable Respiratory status: spontaneous breathing, nonlabored ventilation, respiratory function stable and patient connected to nasal cannula oxygen Cardiovascular status: blood pressure returned to baseline and stable Postop Assessment: no apparent nausea or vomiting Anesthetic complications: no    Last Vitals:  Vitals:   05/17/19 1140 05/17/19 1150  BP: (!) 128/58 126/64  Pulse: 77 79  Resp: 17 15  Temp:    SpO2: 100% 100%    Last Pain:  Vitals:   05/17/19 1150  TempSrc:   PainSc: 0-No pain                 Nasha Diss DANIEL

## 2019-05-17 NOTE — Evaluation (Signed)
Physical Therapy Evaluation Patient Details Name: [name] MRN: [id number] DOB: June 15, 1924 Today's Date: 05/17/2019   History of Present Illness  85 yo male with onset of acute anemia received EGD to determine no source of GI  bleeding but did show gastric erosions. Has been transfused 2 units PRBC.   Pt was also noted to have negative covid screen, has idiopathic thrombocytopenia.  PMHx:  CHF, a-fib, CKD 4, CVA 2018, GI bleed, chronic anticoagulation, LE venous stasis, R LE DVT,   Clinical Impression  Pt was seen to evaluate strength and mobility, and was with his daughter from [place] when PT arrived.  Pt may be impacted by EGD anesthesia but could not move in any way without help.  He is expecting to go home and when PT conferred with daughter, learned that he is able to have a child of his with him at all times.  Follow acutely for stair training and monitoring of his ability to stand up tomorrow, and will expect DC tomorrow.    Follow Up Recommendations Home health PT;Supervision for mobility/OOB;Supervision/Assistance - 24 hour(requires 24/7 to go home)    Equipment Recommendations  Rolling walker with 5" wheels(if pt does not have one)    Recommendations for Other Services       Precautions / Restrictions Precautions Precautions: Fall Precaution Comments: monitor for light headed feelings Restrictions Weight Bearing Restrictions: No      Mobility  Bed Mobility Overal bed mobility: Needs Assistance Bed Mobility: Supine to Sit;Sit to Supine     Supine to sit: Min assist Sit to supine: Min assist   General bed mobility comments: min assist to sit up side of bed with help for pivot   Transfers Overall transfer level: Needs assistance Equipment used: Rolling walker (2 wheeled);1 person hand held assist Transfers: Sit to/from Stand Sit to Stand: Mod assist         General transfer comment: mod to power up with pt not demonstrating a good follow  through on using UE's  Ambulation/Gait Ambulation/Gait assistance: Min assist Gait Distance (Feet): 70 Feet Assistive device: Straight cane;1 person hand held assist Gait Pattern/deviations: Step-through pattern;Wide base of support;Drifts right/left Gait velocity: reduced Gait velocity interpretation: <1.8 ft/sec, indicate of risk for recurrent falls General Gait Details: pt is not steady on [facility] and daughter reports he does not always need SPC at home.  Daughter is basically stating he will go home  Stairs            Wheelchair Mobility    Modified Rankin (Stroke Patients Only)       Balance Overall balance assessment: Needs assistance Sitting-balance support: Feet supported Sitting balance-Leahy Scale: Fair     Standing balance support: Bilateral upper extremity supported;During functional activity Standing balance-Leahy Scale: Poor                               Pertinent Vitals/Pain Pain Assessment: No/denies pain    Home Living Family/patient expects to be discharged to:: Private residence Living Arrangements: Children Available Help at Discharge: Family;Available 24 hours/day Type of Home: House Home Access: Stairs to enter Entrance Stairs-Rails: Psychiatric nurse of Steps: 5 Home Layout: One level Home Equipment: Cane - single point;Walker - 2 wheels;Transport chair Additional Comments: pt has access to his children who can stay with him    Prior Function Level of Independence: Independent with assistive device(s)  Comments: was out mowing lawns previously     Hand Dominance   Dominant Hand: Right    Extremity/Trunk Assessment   Upper Extremity Assessment Upper Extremity Assessment: Overall WFL for tasks assessed    Lower Extremity Assessment Lower Extremity Assessment: Generalized weakness    Cervical / Trunk Assessment Cervical / Trunk Assessment: Kyphotic  Communication   Communication: HOH   Cognition Arousal/Alertness: Awake/alert Behavior During Therapy: Impulsive Overall Cognitive Status: Within Functional Limits for tasks assessed                                 General Comments: pt is being spoken for by daughter who knows him well      General Comments General comments (skin integrity, edema, etc.): due to instability of pt with all movement, will recommend he not be home unless family can provide around the clock care.  Pt has had anesthesia and will reattempt mobility tomorrow to see if stairs are feasible and pt is safe    Exercises Other Exercises Other Exercises: hips in mild flexion contractures that impact balance   Assessment/Plan    PT Assessment Patient needs continued PT services  PT Problem List Decreased range of motion;Decreased activity tolerance;Decreased balance;Decreased mobility;Decreased coordination;Decreased knowledge of use of DME;Decreased safety awareness;Cardiopulmonary status limiting activity;Decreased skin integrity;Other (comment)(cognitive changes of anesthesia)       PT Treatment Interventions DME instruction;Gait training;Stair training;Functional mobility training;Therapeutic activities;Therapeutic exercise;Balance training;Neuromuscular re-education;Patient/family education    PT Goals (Current goals can be found in the Care Plan section)  Acute Rehab PT Goals Patient Stated Goal: none PT Goal Formulation: With patient/family Time For Goal Achievement: 05/31/19 Potential to Achieve Goals: Good    Frequency Min 3X/week   Barriers to discharge Inaccessible home environment;Decreased caregiver support home alone with stairs to enter house    Co-evaluation               AM-PAC PT "6 Clicks" Mobility  Outcome Measure Help needed turning from your back to your side while in a flat bed without using bedrails?: A Little Help needed moving from lying on your back to sitting on the side of a flat bed without  using bedrails?: A Little Help needed moving to and from a bed to a chair (including a wheelchair)?: A Lot Help needed standing up from a chair using your arms (e.g., wheelchair or bedside chair)?: A Lot Help needed to walk in hospital room?: A Lot Help needed climbing 3-5 steps with a railing? : Total 6 Click Score: 13    End of Session Equipment Utilized During Treatment: Gait belt Activity Tolerance: Patient limited by fatigue;Other (comment)(ROM of legs) Patient left: in bed;with call bell/phone within reach;with bed alarm set;with family/visitor present Nurse Communication: Mobility status PT Visit Diagnosis: Unsteadiness on feet (R26.81);Difficulty in walking, not elsewhere classified (R26.2)    Time: 0158-6825 PT Time Calculation (min) (ACUTE ONLY): 20 min   Charges:   PT Evaluation $PT Eval Moderate Complexity: 1 Mod         Ramond Dial 05/17/2019, 6:07 PM   Mee Hives, PT MS Acute Rehab Dept. Number: [place] [facility] [place]

## 2019-05-17 NOTE — Op Note (Addendum)
[facility] Patient Name: [name] Procedure Date : 05/17/2019 MRN: [id number] Attending MD: Thornton Park MD, MD Date of Birth: 10-23-1923 CSN: [id number] Age: 85 Admit Type: Inpatient Procedure:                Upper GI endoscopy Indications:              Symptomatic, acute post hemorrhagic anemia, Heme                            positive stool. No overt bleeding. Plavix and                            Xarelto are on hold.                           EGD 2019 with [facility] showed gastritis. Providers:                Thornton Park MD, MD, Ashley Jacobs, RN, Elspeth Cho Tech., Technician Referring MD:              Medicines:                Monitored Anesthesia Care Complications:            No immediate complications. Estimated blood loss:                            Minimal. Estimated Blood Loss:     Estimated blood loss was minimal. Procedure:                Pre-Anesthesia Assessment:                           - Prior to the procedure, a History and Physical                            was performed, and patient medications and                            allergies were reviewed. The patient's tolerance of                            previous anesthesia was also reviewed. The risks                            and benefits of the procedure and the sedation                            options and risks were discussed with the patient.                            All questions were answered, and informed consent                            was obtained.  Prior Anticoagulants: The patient has                            taken Xarelto (rivaroxaban), last dose was 2 days                            prior to procedure. ASA Grade Assessment: III - A                            patient with severe systemic disease. After                            reviewing the risks and benefits, the patient was                            deemed in satisfactory  condition to undergo the                            procedure.                           After obtaining informed consent, the endoscope was                            passed under direct vision. Throughout the                            procedure, the patient's blood pressure, pulse, and                            oxygen saturations were monitored continuously. The                            GIF-H190 ([id number]) Olympus gastroscope was                            introduced through the mouth, and advanced to the                            third part of duodenum. The upper GI endoscopy was                            accomplished without difficulty. The patient                            tolerated the procedure well. Scope In: Scope Out: Findings:      The esophagus was normal.      Two small erosions with no bleeding and no stigmata of recent bleeding       were found in the gastric body. Biopsies were taken from the antrum,       body, and fundus with a cold forceps for histology. Estimated blood loss       was minimal. No blood or hematin present.      The examined duodenum was normal. No blood or hematin  present.      Mucosa in the stomach and duodenum was friable. The exam was otherwise       without abnormality. Impression:               - Normal esophagus.                           - Two small gastric erosions with no bleeding and                            no stigmata of recent bleeding. Biopsied.                           - Normal examined duodenum.                           - The examination was otherwise normal. No source                            for anemia identified. Recommendation:           - Return patient to hospital ward for ongoing care.                           - Clear liquid diet. Advance as tolerated.                           - Continue present medications.                           - Continue serial hgb/hct with transfusion as                             indicated.                           - Await pathology results.                           - The patient is at high risk for colonoscopy given                            age, comorbidities, and need for Plavix and                            Xarelto. Would prefer to avoid colonoscopy. Proceed                            with CT colon as next step.                           - May resume Plavix and Xarelto tomorrow.                           These results and recommendations were discussed  with the patient's daughter and sister by telephone                            this morning. Procedure Code(s):        --- Professional ---                           [phone], Esophagogastroduodenoscopy, flexible,                            transoral; with biopsy, single or multiple Diagnosis Code(s):        --- Professional ---                           K31.89, Other diseases of stomach and duodenum                           D62, Acute posthemorrhagic anemia                           R19.5, Other fecal abnormalities CPT copyright 2019 [employer] Medical Association. All rights reserved. The codes documented in this report are preliminary and upon coder review may  be revised to meet current compliance requirements. Thornton Park MD, MD 05/17/2019 11:35:34 AM This report has been signed electronically. Number of Addenda: 0

## 2019-05-18 ENCOUNTER — Encounter (HOSPITAL_COMMUNITY): Payer: Self-pay | Admitting: Gastroenterology

## 2019-05-18 LAB — BASIC METABOLIC PANEL
Anion gap: 9 (ref 5–15)
BUN: 34 mg/dL — ABNORMAL HIGH (ref 8–23)
CO2: 25 mmol/L (ref 22–32)
Calcium: 9 mg/dL (ref 8.9–10.3)
Chloride: 106 mmol/L (ref 98–111)
Creatinine, Ser: 1.72 mg/dL — ABNORMAL HIGH (ref 0.61–1.24)
GFR calc Af Amer: 39 mL/min — ABNORMAL LOW (ref >60)
GFR calc non Af Amer: 34 mL/min — ABNORMAL LOW (ref >60)
Glucose, Bld: 113 mg/dL — ABNORMAL HIGH (ref 70–99)
Potassium: 3.4 mmol/L — ABNORMAL LOW (ref 3.5–5.1)
Sodium: 140 mmol/L (ref 135–145)

## 2019-05-18 LAB — CBC
HCT: 27 % — ABNORMAL LOW (ref 39.0–52.0)
Hemoglobin: 7.9 g/dL — ABNORMAL LOW (ref 13.0–17.0)
MCH: 22.3 pg — ABNORMAL LOW (ref 26.0–34.0)
MCHC: 29.3 g/dL — ABNORMAL LOW (ref 30.0–36.0)
MCV: 76.3 fL — ABNORMAL LOW (ref 80.0–100.0)
Platelets: 97 10*3/uL — ABNORMAL LOW (ref 150–400)
RBC: 3.54 MIL/uL — ABNORMAL LOW (ref 4.22–5.81)
RDW: 21 % — ABNORMAL HIGH (ref 11.5–15.5)
WBC: 4.7 10*3/uL (ref 4.0–10.5)
nRBC: 0 % (ref 0.0–0.2)

## 2019-05-18 LAB — HEMOGLOBIN AND HEMATOCRIT, BLOOD
HCT: 29.5 % — ABNORMAL LOW (ref 39.0–52.0)
Hemoglobin: 8.6 g/dL — ABNORMAL LOW (ref 13.0–17.0)

## 2019-05-18 LAB — SURGICAL PATHOLOGY

## 2019-05-18 MED ORDER — POTASSIUM CHLORIDE CRYS ER 20 MEQ PO TBCR
40.0000 meq | EXTENDED_RELEASE_TABLET | Freq: Once | ORAL | Status: AC
  Administered 2019-05-18: 40 meq via ORAL
  Filled 2019-05-18: qty 2

## 2019-05-18 MED ORDER — FERROUS SULFATE 325 (65 FE) MG PO TABS: 325.0000 mg | ORAL_TABLET | Freq: Two times a day (BID) | ORAL | 0 refills | Status: DC

## 2019-05-18 MED ORDER — CLOPIDOGREL BISULFATE 75 MG PO TABS
75.0000 mg | ORAL_TABLET | Freq: Every day | ORAL | Status: DC
  Administered 2019-05-18: 75 mg via ORAL
  Filled 2019-05-18: qty 1

## 2019-05-18 MED ORDER — FERROUS SULFATE 325 (65 FE) MG PO TABS: 325.0000 mg | ORAL_TABLET | Freq: Two times a day (BID) | ORAL | Status: DC

## 2019-05-18 MED ORDER — RIVAROXABAN 15 MG PO TABS: 15.0000 mg | ORAL_TABLET | Freq: Every day | ORAL | Status: DC

## 2019-05-18 NOTE — Evaluation (Signed)
Occupational Therapy Evaluation Patient Details Name: [name] MRN: [id number] DOB: [birth year] Today's Date: 05/18/2019    History of Present Illness 85 yo male with onset of acute anemia received EGD to determine no source of GI  bleeding but did show gastric erosions. Has been transfused 2 units PRBC.   Pt was also noted to have negative covid screen, has idiopathic thrombocytopenia.  PMHx:  CHF, a-fib, CKD 4, CVA 2018, GI bleed, chronic anticoagulation, LE venous stasis, R LE DVT,    Clinical Impression   Pt is assisted for showering, LB dressing and heavy meal prep and housekeeping. He struggles to stand from low surfaces at baseline. Pt has all necessary equipment at home and his family will be staying with him when he returns home initially. Daughter is declining any further OT intervention.     Follow Up Recommendations  No OT follow up    Equipment Recommendations  None recommended by OT    Recommendations for Other Services       Precautions / Restrictions Precautions Precautions: Fall Restrictions Weight Bearing Restrictions: No      Mobility Bed Mobility               General bed mobility comments: up in chair  Transfers Overall transfer level: Needs assistance Equipment used: 1 person hand held assist Transfers: Sit to/from Stand Sit to Stand: Min assist         General transfer comment: steadying assist    Balance Overall balance assessment: Needs assistance   Sitting balance-Leahy Scale: Good     Standing balance support: Single extremity supported                               ADL either performed or assessed with clinical judgement   ADL Overall ADL's : At baseline                                             Vision Baseline Vision/History: Wears glasses Wears Glasses: At all times Patient Visual Report: No change from baseline       Perception     Praxis      Pertinent Vitals/Pain  Pain Assessment: No/denies pain     Hand Dominance Right   Extremity/Trunk Assessment Upper Extremity Assessment Upper Extremity Assessment: Overall WFL for tasks assessed   Lower Extremity Assessment Lower Extremity Assessment: Defer to PT evaluation   Cervical / Trunk Assessment Cervical / Trunk Assessment: Kyphotic;Other exceptions(per daughter, pt has stenosis)   Communication Communication Communication: HOH   Cognition Arousal/Alertness: Awake/alert Behavior During Therapy: WFL for tasks assessed/performed Overall Cognitive Status: Within Functional Limits for tasks assessed                                 General Comments: difficult to accurately assess due to [facility], daughter answering questions for him   General Comments       Exercises     Shoulder Instructions      Home Living Family/patient expects to be discharged to:: Private residence Living Arrangements: Alone Available Help at Discharge: Family;Available 24 hours/day Type of Home: House Home Access: Stairs to enter [employer] of Steps: 5 Entrance Stairs-Rails: Right;Left Home Layout: One level     Bathroom Shower/Tub:  Walk-in shower   Bathroom Toilet: Standard     Home Equipment: Cane - single point;Walker - 2 wheels;Transport chair;Shower seat;Hand held shower head;Bedside commode;Adaptive equipment Adaptive Equipment: Other (Comment)(button aide, shoe buttons)        Prior Functioning/Environment Level of Independence: Needs assistance  Gait / Transfers Assistance Needed: difficulty rising from low surfaces, bed, chairs and toilet are all elevated ADL's / Homemaking Assistance Needed: daughter helps with LB dressing, showering, heavy meal prep and housekeeping   Comments: pt drives, gets his own dry cleaning        OT Problem List:        OT Treatment/Interventions:      OT Goals(Current goals can be found in the care plan section) Acute Rehab OT  Goals Patient Stated Goal: to return home  OT Frequency:     Barriers to D/C:            Co-evaluation              AM-PAC OT "6 Clicks" Daily Activity     Outcome Measure Help from another person eating meals?: None Help from another person taking care of personal grooming?: A Little Help from another person toileting, which includes using toliet, bedpan, or urinal?: A Little Help from another person bathing (including washing, rinsing, drying)?: A Lot Help from another person to put on and taking off regular upper body clothing?: A Lot Help from another person to put on and taking off regular lower body clothing?: None 6 Click Score: 18   End of Session Equipment Utilized During Treatment: Gait belt  Activity Tolerance: Patient tolerated treatment well Patient left: in chair;with call bell/phone within reach;with family/visitor present  OT Visit Diagnosis: Unsteadiness on feet (R26.81)                Time: 1140-1155 OT Time Calculation (min): 15 min Charges:  OT General Charges $OT Visit: 1 Visit OT Evaluation $OT Eval Moderate Complexity: 1 Mod  Nestor Lewandowsky, OTR/L Acute Rehabilitation Services Pager: [phone] Office: [phone]  Malka So 05/18/2019, 12:09 PM

## 2019-05-18 NOTE — Progress Notes (Addendum)
Progress Note    ASSESSMENT AND PLAN:   68. 85 yo male with ITP / CAD / stent / ischemic cardiomyopathy, chronic systolic heart failure / AFIB / hx of DVT / CKD4.    2. Heme positive acute /on chronic anemia on plavix and xarelto. He is iron deficient.  No overt GI bleeding. No focal GI symptoms. No overt GI bleeding -EGD yesterday >> friable gastric and duodenal mucosa,  two non-bleeding gastric erosions. Biopsies pending. He has diverticulosis but no gross colon lesions on CT AP done yesterday.  -Post 2 uPRBC on 11/3 with appropriate rise in hgb from to 8.9 but down overnight to 7.9 in absence of overt GI bleeding. Will repeat H+H  -Plavix and Xarelto to be resumed today -Oral iron supplements plus stool softener upon. Would give BID and get follow up CBC with PCP in 4 weeks -We will notify him of gastric biopsy results when available     SUBJECTIVE   No complaints. No GI bleeding   OBJECTIVE:     Vital signs in last 24 hours: Temp:  [97.8 F (36.6 C)-98.1 F (36.7 C)] 98.1 F (36.7 C) (11/06 0306) Pulse Rate:  [76-102] 102 (11/06 0300) Resp:  [13-18] 14 (11/06 0306) BP: (107-137)/(58-72) 120/67 (11/06 0306) SpO2:  [92 %-100 %] 98 % (11/06 0306) Weight:  [77.2 kg] 77.2 kg (11/06 0300) Last BM Date: 05/15/19 General:   Alert, well-developed male in NAD EENT:  Normal hearing, non icteric sclera, conjunctive pink.  Heart:  Regular rate Pulm: Normal respiratory effort Abdomen:  Soft, nondistended, nontender.  Normal bowel sounds.          Neurologic:  Alert and  oriented x4;  grossly normal neurologically. Psych:  Pleasant, cooperative.  Normal mood and affect.   Intake/Output from previous day: 11/05 0701 - 11/06 0700 In: 700 [P.O.:500; I.V.:200] Out: 500 [Urine:500] Intake/Output this shift: No intake/output data recorded.  Lab Results: Recent Labs    05/16/19 1809 05/17/19 0755 05/18/19 0355  WBC 6.7 6.7 4.7  HGB 9.0* 8.9* 7.9*  HCT 30.0* 30.1*  27.0*  PLT 121* 133* 97*   BMET Recent Labs    05/16/19 0414 05/17/19 0508 05/18/19 0355  NA 144 141 140  K 4.1 3.6 3.4*  CL 107 104 106  CO2 28 26 25   GLUCOSE 115* 121* 113*  BUN 45* 42* 34*  CREATININE 2.07* 1.97* 1.72*  CALCIUM 9.2 9.1 9.0    Ct Abdomen Pelvis Wo Contrast  Result Date: 05/17/2019 CLINICAL DATA:  85 year old male with anemia and heme-positive stools. EXAM: CT ABDOMEN AND PELVIS WITHOUT CONTRAST TECHNIQUE: Multidetector CT imaging of the abdomen and pelvis was performed following the standard protocol without IV contrast. COMPARISON:  11/18/2009 CT FINDINGS: Please note that parenchymal abnormalities may be missed without intravenous contrast. Lower chest: Cardiomegaly again noted. Hepatobiliary: The liver is unremarkable. Cholelithiasis noted without CT evidence of acute cholecystitis. No biliary dilatation. Pancreas: Unremarkable Spleen: Unremarkable Adrenals/Urinary Tract: Bilateral renal atrophy and multiple bilateral renal cysts are again noted. There is no evidence of hydronephrosis or urinary calculi. The adrenal glands and bladder are unremarkable. Stomach/Bowel: Stomach is within normal limits. Appendix appears normal. No evidence of definite bowel wall thickening, distention, or inflammatory changes. Colonic diverticulosis noted without evidence of acute diverticulitis. Vascular/Lymphatic: Aortic atherosclerosis. No enlarged abdominal or pelvic lymph nodes. Reproductive: Prostate enlargement again noted. Other: No ascites, focal collection or pneumoperitoneum. Musculoskeletal: No acute or suspicious bony abnormalities. Severe degenerative changes in the lumbar  spine again noted. IMPRESSION: 1. Colonic diverticulosis without definite bowel wall thickening, inflammation or mass. Correlation with the patient's colon cancer screening history is recommended. If screening is not up-to-date, appropriate screening should be considered. 2. Cholelithiasis without CT evidence of  acute cholecystitis. 3. Prostate enlargement. 4. Cardiomegaly. 5. Aortic Atherosclerosis (ICD10-I70.0). Electronically Signed   By: Margarette Canada M.D.   On: 05/17/2019 21:07       Principal Problem:   Acute anemia Active Problems:   Coronary atherosclerosis of native coronary artery   HTN (hypertension)   Chronic ITP (idiopathic thrombocytopenia) (HCC)   Persistent atrial fibrillation (HCC)   Congestive heart failure with left ventricular diastolic dysfunction (HCC)   History of GI bleed   GERD (gastroesophageal reflux disease)   Chronic kidney disease, stage 4 (severe) (HCC)   GI bleed   Symptomatic anemia   Gastritis and gastroduodenitis     LOS: 3 days   Tye Savoy ,NP 05/18/2019, 8:59 AM

## 2019-05-18 NOTE — TOC Progression Note (Addendum)
Transition of Care [facility]) - Progression Note    Patient Details  Name: [name] MRN: [id number] Date of Birth: 07-03-1924  Transition of [facility]) CM/SW Contact  Eileen Stanford, LCSW Phone Number: 05/18/2019, 12:29 PM  Clinical Narrative:   Pt's HH is set up through [facility]. Pt has a walker at home.    Expected Discharge Plan: [place] Barriers to Discharge: Continued Medical Work up  Expected Discharge Plan and Services Expected Discharge Plan: [place] In-house Referral: NA   Post Acute Care Choice: [place] arrangements for the past 2 months: Single Family Home                           HH Arranged: PT, OT [place] Agency: [place] ([facility]) Date HH Agency Contacted: 05/18/19 Time [place]: 1229 Representative spoke with at [place]: [place] ([place]) Interventions    Readmission Risk Interventions No flowsheet data found.

## 2019-05-18 NOTE — Care Management Important Message (Signed)
Important Message  Patient Details  Name: CORDERIUS SARACENI MRN: [id number] Date of Birth: Jun 22, 1924   Medicare Important Message Given:  Yes     Shelda Altes 05/18/2019, 3:01 PM

## 2019-05-18 NOTE — Plan of Care (Signed)
  Problem: Education: Goal: Knowledge of General Education information will improve Description: Including pain rating scale, medication(s)/side effects and non-pharmacologic comfort measures 05/18/2019 1127 by Ethel Rana, RN Outcome: Progressing 05/18/2019 1105 by Ethel Rana, RN Outcome: Progressing   Problem: Health Behavior/Discharge Planning: Goal: Ability to manage health-related needs will improve 05/18/2019 1127 by Ethel Rana, RN Outcome: Progressing 05/18/2019 1105 by Ethel Rana, RN Outcome: Progressing   Problem: Clinical Measurements: Goal: Ability to maintain clinical measurements within normal limits will improve 05/18/2019 1127 by Ethel Rana, RN Outcome: Progressing 05/18/2019 1105 by Ethel Rana, RN Outcome: Progressing Goal: Diagnostic test results will improve Outcome: Progressing Goal: Respiratory complications will improve Outcome: Progressing

## 2019-05-18 NOTE — Plan of Care (Signed)

## 2019-05-18 NOTE — TOC Initial Note (Signed)
Transition of Care [facility]) - Initial/Assessment Note    Patient Details  Name: [name] MRN: [id number] Date of Birth: 11/08/1923  [facility]) CM/SW Contact:    Eileen Stanford, LCSW Phone Number: 05/18/2019, 10:43 AM  Clinical Narrative:      Pt alert and oriented. Pt's daughter Deneise Lever present at bedside. Pt will d/c home. Deneise Lever will have her child stay with pt 24/7. Pt has used [facility] before- [place]. Pt's family requesting to use them again. Pt has a walker at home. Referral given to [facility]--awaiting decision.             Expected Discharge Plan: [place] Barriers to Discharge: Continued Medical Work up   Patient Goals and CMS Choice Patient states their goals for this hospitalization and ongoing recovery are:: "to get home"      Expected Discharge Plan and Services Expected Discharge Plan: [place] In-house Referral: NA   Post Acute Care Choice: [place] arrangements for the past 2 months: Single Family Home                           HH Arranged: PT, OT          Prior Living Arrangements/Services Living arrangements for the past 2 months: Single Family Home Lives with:: Adult Children Patient language and need for interpreter reviewed:: Yes Do you feel safe going back to the place where you live?: Yes      Need for Family Participation in Patient Care: Yes (Comment) Care giver support system in place?: Yes (comment)   Criminal Activity/Legal Involvement Pertinent to Current Situation/Hospitalization: No - Comment as needed  Activities of Daily Living Home Assistive Devices/Equipment: Cane (specify quad or straight) ADL Screening (condition at time of admission) Patient's cognitive ability adequate to safely complete daily activities?: Yes Is the patient deaf or have difficulty hearing?: Yes Does the patient have difficulty seeing, even when wearing glasses/contacts?: No Does the  patient have difficulty concentrating, remembering, or making decisions?: No Patient able to express need for assistance with ADLs?: Yes Does the patient have difficulty dressing or bathing?: No Independently performs ADLs?: Yes (appropriate for developmental age) Does the patient have difficulty walking or climbing stairs?: Yes Weakness of Legs: None Weakness of Arms/Hands: None  Permission Sought/Granted Permission sought to share information with : Family Supports    Share Information with NAME: Deneise Lever  Permission granted to share info w AGENCY: [facility]  Permission granted to share info w Relationship: daughter     Emotional Assessment Appearance:: Appears stated age Attitude/Demeanor/Rapport: Engaged Affect (typically observed): Accepting, Appropriate, Calm Orientation: : Oriented to Self, Oriented to Place, Oriented to  Time, Oriented to Situation Alcohol / Substance Use: Not Applicable Psych Involvement: No (comment)  Admission diagnosis:  Near syncope [R55] Symptomatic anemia [D64.9] GI bleed [K92.2] Patient Active Problem List   Diagnosis Date Noted  . Gastritis and gastroduodenitis   . Symptomatic anemia   . Acute anemia 05/15/2019  . Chronic kidney disease, stage 4 (severe) ([place]) 05/15/2019  . GI bleed 05/15/2019  . Near syncope   . GERD (gastroesophageal reflux disease) 01/11/2019  . Allergic rhinitis 01/11/2019  . History of GI bleed 03/17/2018  . Persistent atrial fibrillation ([place])   . Chronic ITP (idiopathic thrombocytopenia) (HCC)   . Coronary atherosclerosis of native coronary artery   . HTN (hypertension)   . Hyperlipidemia 04/23/2013  . Benign  prostate hyperplasia 10/04/2011  . Congestive heart failure with left ventricular diastolic dysfunction ([place]) 05/25/2011   PCP:  Leeanne Rio, MD Pharmacy:   [id number] - [place], [place] - [address] [facility] [place] [address] Phone: [phone] Fax:  [phone]     Social Determinants of Health (SDOH) Interventions    Readmission Risk Interventions No flowsheet data found.

## 2019-05-18 NOTE — Progress Notes (Signed)
NURSING PROGRESS NOTE  [name] [id number] Discharge Data: 05/18/2019 3:26 PM Attending Provider: Martyn Malay, MD [id number], Delorse Limber, MD     [name] to be D/C'd Home per MD order.  Discussed with the patient the After Visit Summary and all questions fully answered. All IV's discontinued with no bleeding noted. All belongings returned to patient for patient to take home. Pt taken downstairs via wheelchair by daughter.  Last Vital Signs:  Blood pressure (!) 105/50, pulse 85, temperature (!) 97.5 F (36.4 C), temperature source Oral, resp. rate 13, height 5\' 10"  (1.778 m), weight 77.2 kg, SpO2 96 %.  Discharge Medication List Allergies as of 05/18/2019      Reactions   Prednisone Other (See Comments)   "Made me not feel right when I took it"   Rosuvastatin Other (See Comments)   "Makes me ache"      Medication List    TAKE these medications   clopidogrel 75 MG tablet Commonly known as: PLAVIX TAKE 1 TABLET (75 MG TOTAL) BY MOUTH DAILY WITH BREAKFAST. Notes to patient: 11/7 am   famotidine 20 MG tablet Commonly known as: PEPCID TAKE 1 TABLET BY MOUTH TWICE A DAY Notes to patient: 11/6 pm   ferrous sulfate 325 (65 FE) MG tablet Take 1 tablet (325 mg total) by mouth 2 (two) times daily with a meal. Notes to patient: 11/6   fluticasone 50 MCG/ACT nasal spray Commonly known as: FLONASE Place 2 sprays into both nostrils daily as needed for allergies or rhinitis.   furosemide 40 MG tablet Commonly known as: LASIX Take 1 tablet (40 mg total) by mouth daily. Notes to patient: 11/6   gabapentin 300 MG capsule Commonly known as: NEURONTIN Take 300 mg by mouth at bedtime. Notes to patient: 11/6 am   loratadine 10 MG tablet Commonly known as: CLARITIN Take 10 mg by mouth daily as needed for allergies.   methocarbamol 500 MG tablet Commonly known as: ROBAXIN Take 500 mg by mouth 2 (two) times daily as needed for muscle spasms.   metoprolol  tartrate 25 MG tablet Commonly known as: LOPRESSOR Take 0.5 tablets (12.5 mg total) by mouth 2 (two) times daily. Notes to patient: 11/6 pm   nitroGLYCERIN 0.4 MG SL tablet Commonly known as: NITROSTAT Place 0.4 mg under the tongue every 5 (five) minutes as needed for chest pain.   pantoprazole 40 MG tablet Commonly known as: PROTONIX Take 1 tablet (40 mg total) by mouth 2 (two) times daily. Notes to patient: 11/6 pm   potassium chloride SA 20 MEQ tablet Commonly known as: KLOR-CON Take 20 mEq by mouth daily. Notes to patient: 11/7 am   pravastatin 20 MG tablet Commonly known as: PRAVACHOL Take 1 tablet (20 mg total) by mouth daily at 6 PM. Notes to patient: 11/6 pm   pregabalin 75 MG capsule Commonly known as: LYRICA Take 1 capsule (75 mg total) by mouth 2 (two) times daily. Notes to patient: 11/6 pm   Rivaroxaban 15 MG Tabs tablet Commonly known as: XARELTO Take 1 tablet (15 mg total) by mouth daily with supper. Notes to patient: 11/6 dinner   senna 8.6 MG Tabs tablet Commonly known as: SENOKOT Take 1 tablet (8.6 mg total) by mouth daily as needed for mild constipation. Notes to patient: 11/7 am   Systane Ultra PF 0.4-0.3 % Soln Generic drug: Polyethyl Glyc-Propyl Glyc PF Place 1-2 drops into both eyes every morning.  Durable Medical Equipment  (From admission, onward)         Start     Ordered   05/18/19 0859  For home use only DME Walker rolling  Once    Question:  Patient needs a walker to treat with the following condition  Answer:  Balance problem   05/18/19 0858

## 2019-05-18 NOTE — Progress Notes (Signed)
Physical Therapy Treatment Patient Details Name: [name] MRN: [id number] DOB: 09/18/1923 Today's Date: 05/18/2019    History of Present Illness 85 yo male with onset of acute anemia received EGD to determine no source of GI  bleeding but did show gastric erosions. Has been transfused 2 units PRBC.   Pt was also noted to have negative covid screen, has idiopathic thrombocytopenia.  PMHx:  CHF, a-fib, CKD 4, CVA 2018, GI bleed, chronic anticoagulation, LE venous stasis, R LE DVT,     PT Comments    On entry pt up in chair with daughter in room. Daughter reports in addition to 5 steps to enter pt also has a ramp that he can use and that stair training is not necessary. Pt agreeable to short walk with therapy. Pt is minA for sit>stand with SPC and generally min A for ambulation with SPC, however has one LoB that requires modA for steadying. Once back in room suggest RW for pt but he is not interested, so educated daughter on need for use of gait belt with pt whenever he ambulates to decreased risk of falls. Daughter in agreement. D/c plans remain appropriate.    Follow Up Recommendations  Home health PT;Supervision for mobility/OOB;Supervision/Assistance - 24 hour(requires 24/7 to go home)     Equipment Recommendations  Rolling walker with 5" wheels(if pt does not have one)       Precautions / Restrictions Precautions Precautions: Fall Restrictions Weight Bearing Restrictions: No    Mobility  Bed Mobility Pt up in recliner  Transfers Overall transfer level: Needs assistance Equipment used: Straight cane Transfers: Sit to/from Stand Sit to Stand: Min assist         General transfer comment: min A for steadying in standing and for giving him his cane  Ambulation/Gait Ambulation/Gait assistance: Min assist;Mod assist Gait Distance (Feet): 80 Feet Assistive device: Straight cane;1 person hand held assist Gait Pattern/deviations: Step-through pattern;Wide base of  support;Drifts right/left Gait velocity: reduced Gait velocity interpretation: <1.8 ft/sec, indicate of risk for recurrent falls General Gait Details: minA for steadying, pt with 1x LoB requiring ModA for balance, educated daughter in room that if he uses [facility] he will need someone with hands on him, daughter agrees to gait belt use       Balance Overall balance assessment: Needs assistance Sitting-balance support: Feet supported Sitting balance-Leahy Scale: Fair     Standing balance support: Single extremity supported Standing balance-Leahy Scale: Fair                              Cognition Arousal/Alertness: Awake/alert Behavior During Therapy: Impulsive Overall Cognitive Status: Within Functional Limits for tasks assessed                                 General Comments: pt is being spoken for by daughter who knows him well         General Comments General comments (skin integrity, edema, etc.): Pt daughter in room and reports in addition to 5 steps pt also has ramp and that stair training is not necessary. Agreeable to short ambulation but daughter is anxious to discharge      Pertinent Vitals/Pain Pain Assessment: No/denies pain    Home Living Family/patient expects to be discharged to:: Private residence Living Arrangements: Alone Available Help at Discharge: Family;Available 24 hours/day Type of Home: House Home Access: Stairs  to enter, ramp Entrance Stairs-Rails: Right;Left Home Layout: One level Home Equipment: Cane - single point;Walker - 2 wheels;Transport chair;Shower seat;Hand held shower head;Bedside commode;Adaptive equipment      Prior Function Level of Independence: Needs assistance  Gait / Transfers Assistance Needed: difficulty rising from low surfaces, bed, chairs and toilet are all elevated ADL's / Homemaking Assistance Needed: daughter helps with LB dressing, showering, heavy meal prep and housekeeping Comments: pt drives,  gets his own dry cleaning   PT Goals (current goals can now be found in the care plan section) Acute Rehab PT Goals Patient Stated Goal: none PT Goal Formulation: With patient/family Time For Goal Achievement: 05/31/19 Potential to Achieve Goals: Good Progress towards PT goals: Progressing toward goals    Frequency    Min 3X/week      PT Plan Current plan remains appropriate       AM-PAC PT "6 Clicks" Mobility   Outcome Measure  Help needed turning from your back to your side while in a flat bed without using bedrails?: A Little Help needed moving from lying on your back to sitting on the side of a flat bed without using bedrails?: A Little Help needed moving to and from a bed to a chair (including a wheelchair)?: A Lot Help needed standing up from a chair using your arms (e.g., wheelchair or bedside chair)?: A Lot Help needed to walk in hospital room?: A Lot Help needed climbing 3-5 steps with a railing? : Total 6 Click Score: 13    End of Session Equipment Utilized During Treatment: Gait belt Activity Tolerance: Patient limited by fatigue;Other (comment)(ROM of legs) Patient left: in bed;with call bell/phone within reach;with bed alarm set;with family/visitor present Nurse Communication: Mobility status PT Visit Diagnosis: Unsteadiness on feet (R26.81);Difficulty in walking, not elsewhere classified (R26.2)     Time: 9311-2162 PT Time Calculation (min) (ACUTE ONLY): 13 min  Charges:  $Gait Training: 8-22 mins                     Lalita Ebel B. Migdalia Dk PT, DPT Acute Rehabilitation Services Pager [phone] Office [phone]    [place] 05/18/2019, 2:48 PM

## 2019-05-21 ENCOUNTER — Telehealth: Payer: Self-pay | Admitting: *Deleted

## 2019-05-21 NOTE — Telephone Encounter (Signed)
Alan Blanks, MD  Ramond Dial, RPH; Leeanne Rio, MD; Rodman Key, RN        [name], Can we let him know that we will let Dr. Ardelia Mems check his H/H at the visit tomorrow in primary care. He should be on Plavix 75 once daily and Xarelto 15 mg once daily. If he has further GI bleeding, will have to consider stopping Plavix. [name]   ______________________________________________________________________________________________  spoke with patient's daughter, [name]. Reviewed recommendation to continue Plavix 75 mg every am and Xarelto 15 mg with supper daily.  Pt continues this. She is aware that primary care will check H/H today and periodically going forward as needed in order to assess his anemia. Pt states that pt is doing okay, but that he fell this am and scraped his leg on his cane.  She said he slid down onto the floor and did not hit his head.  There were no other injuries aside from scrape on his leg.

## 2019-05-22 ENCOUNTER — Ambulatory Visit: Payer: Medicare Other | Admitting: Family Medicine

## 2019-05-22 ENCOUNTER — Other Ambulatory Visit: Payer: Self-pay

## 2019-05-22 VITALS — BP 117/80 | HR 81 | Wt 177.2 lb

## 2019-05-22 DIAGNOSIS — J309 Allergic rhinitis, unspecified: Secondary | ICD-10-CM | POA: Diagnosis not present

## 2019-05-22 DIAGNOSIS — E782 Mixed hyperlipidemia: Secondary | ICD-10-CM | POA: Diagnosis not present

## 2019-05-22 DIAGNOSIS — K219 Gastro-esophageal reflux disease without esophagitis: Secondary | ICD-10-CM

## 2019-05-22 DIAGNOSIS — D649 Anemia, unspecified: Secondary | ICD-10-CM | POA: Diagnosis not present

## 2019-05-22 NOTE — Progress Notes (Signed)
  HPI:  [name] presents for hospital follow up. Patient was hospitalized from 05/15/19 to 05/18/19 with acute anemia requiring transfusion. Had upper endoscopy without findings to explain anemia. Did not have colonoscopy due to risks associated with the procedure, instead had CT abdomen which was unremarkable.   He is accompanied by his daughter who helps provide the history.   Anemia - compliant with iron 325mg  twice daily. He has also resumed his plavix and xarelto 15mg  daily. No blood in stool. Denies chest pain, shortness of breath, dizziness, or lightheadedness.  Skin tears - yesterday he lost his balance and slid down, landing on his bottom. Has bruise on R upper buttock. They think he may have bumped his head when he fell, are not entirely sure. No LOC. Feels fine now. He did tear his skin on his L arm and L hand, and one of the spots on his L forearm is still bleeding some. Daughter has performed wound care and has it wrapped with gauze. Has not taken his anticoagulants yet today.  GERD - taking pantoprazole 40mg  twice daily as well as famotidine 20mg  twice daily. These control his symptoms well.   Allergies - on flonase as needed, works well for him, needs refilled.  ROS: See HPI.  [place]: history of chronic ITP, CAD, GERD hypertension, hyperlipidemia, atrial fibrillation  PHYSICAL EXAM: BP 117/80   Pulse 81   Wt 177 lb 3.2 oz (80.4 kg)   SpO2 99%   BMI 25.43 kg/m  Gen: no acute distress, pleasant, cooperative HEENT: normocephalic, atraumatic. No bruising or tenderness on posterior head or neck. Heart: irreg irregular, no murmurs Lungs: clear to auscultation bilaterally, normal work of breathing  Neuro: alert, speech normal, grossly nonfocal Ext: No appreciable lower extremity edema bilaterally  Skin: 1cm laceration of L lateral forearm just distal to elbow which is intermittently oozing Hemostatic soft tissue laceration/flap of L hand over dorsal 2nd/3rd  knuckle Superficial bruising of the R upper buttock without any palpable hematoma  ASSESSMENT/PLAN:  Acute anemia - check CBC today. Result will guide further workup and management of anticoagulants - continue PO iron supplementation - discussed with patient's cardiologist Dr. Angelena Form. If anemia persists and we needed to stop a blood thinner in the future, Dr. Angelena Form would prefer we stop the plavix and continue on the xarelto at 15mg  daily.  GERD (gastroesophageal reflux disease) Stable. Refill PPI and H2 blocker  Allergic rhinitis Stable, refill flonase  Lacerations - wounds redressed, steri strips applied - hold plavix and xarelto today given continued oozing, while we await CBC - discussed return precautions - to go to ED or call us if more bleeding that can't be stopped or if lacerations worsen  Fall Sounds mechanical, tripped up on his cane. Unclear if he hit his head. Has been acting normally and has no signs of head injury on exam - no tenderness or bruising of head or neck. Mild bruising on R upper buttock without signs of hematoma. Will continue to monitor. Is set up for [facility] physical therapy.   [place]. Ardelia Mems, [place]

## 2019-05-22 NOTE — Patient Instructions (Signed)
Checking bloodwork today Will call with results  If wounds bleed further please call us or go to ER if it's bleeding a lot.  Be well, Dr. Ardelia Mems

## 2019-05-23 ENCOUNTER — Emergency Department (HOSPITAL_COMMUNITY)
Admission: EM | Admit: 2019-05-23 | Discharge: 2019-05-23 | Disposition: A | Discharge status: Home / Self Care | Payer: Medicare Other | Attending: Emergency Medicine | Admitting: Emergency Medicine

## 2019-05-23 ENCOUNTER — Emergency Department (HOSPITAL_COMMUNITY): Payer: Medicare Other

## 2019-05-23 ENCOUNTER — Other Ambulatory Visit: Payer: Self-pay

## 2019-05-23 DIAGNOSIS — Z7901 Long term (current) use of anticoagulants: Secondary | ICD-10-CM | POA: Diagnosis not present

## 2019-05-23 DIAGNOSIS — Y929 Unspecified place or not applicable: Secondary | ICD-10-CM | POA: Diagnosis not present

## 2019-05-23 DIAGNOSIS — Y998 Other external cause status: Secondary | ICD-10-CM | POA: Diagnosis not present

## 2019-05-23 DIAGNOSIS — N184 Chronic kidney disease, stage 4 (severe): Secondary | ICD-10-CM | POA: Diagnosis not present

## 2019-05-23 DIAGNOSIS — Y9389 Activity, other specified: Secondary | ICD-10-CM | POA: Insufficient documentation

## 2019-05-23 DIAGNOSIS — I130 Hypertensive heart and chronic kidney disease with heart failure and stage 1 through stage 4 chronic kidney disease, or unspecified chronic kidney disease: Secondary | ICD-10-CM | POA: Diagnosis not present

## 2019-05-23 DIAGNOSIS — Z79899 Other long term (current) drug therapy: Secondary | ICD-10-CM | POA: Insufficient documentation

## 2019-05-23 DIAGNOSIS — R531 Weakness: Secondary | ICD-10-CM | POA: Insufficient documentation

## 2019-05-23 DIAGNOSIS — W010XXA Fall on same level from slipping, tripping and stumbling without subsequent striking against object, initial encounter: Secondary | ICD-10-CM | POA: Diagnosis not present

## 2019-05-23 DIAGNOSIS — I5032 Chronic diastolic (congestive) heart failure: Secondary | ICD-10-CM | POA: Insufficient documentation

## 2019-05-23 DIAGNOSIS — Z20828 Contact with and (suspected) exposure to other viral communicable diseases: Secondary | ICD-10-CM | POA: Diagnosis not present

## 2019-05-23 DIAGNOSIS — Z96652 Presence of left artificial knee joint: Secondary | ICD-10-CM | POA: Diagnosis not present

## 2019-05-23 DIAGNOSIS — Z96651 Presence of right artificial knee joint: Secondary | ICD-10-CM | POA: Insufficient documentation

## 2019-05-23 DIAGNOSIS — I252 Old myocardial infarction: Secondary | ICD-10-CM | POA: Insufficient documentation

## 2019-05-23 DIAGNOSIS — R42 Dizziness and giddiness: Secondary | ICD-10-CM | POA: Diagnosis not present

## 2019-05-23 DIAGNOSIS — H539 Unspecified visual disturbance: Secondary | ICD-10-CM | POA: Insufficient documentation

## 2019-05-23 LAB — CBC
HCT: 30.6 % — ABNORMAL LOW (ref 39.0–52.0)
Hematocrit: 27.3 % — ABNORMAL LOW (ref 37.5–51.0)
Hemoglobin: 8.1 g/dL — ABNORMAL LOW (ref 13.0–17.7)
Hemoglobin: 8.7 g/dL — ABNORMAL LOW (ref 13.0–17.0)
MCH: 22.7 pg — ABNORMAL LOW (ref 26.6–33.0)
MCH: 23.1 pg — ABNORMAL LOW (ref 26.0–34.0)
MCHC: 29.7 g/dL — ABNORMAL LOW (ref 31.5–35.7)
MCHC: 28.4 g/dL — ABNORMAL LOW (ref 30.0–36.0)
MCV: 77 fL — ABNORMAL LOW (ref 79–97)
MCV: 81.2 fL (ref 80.0–100.0)
Platelets: 106 10*3/uL — ABNORMAL LOW (ref 150–450)
Platelets: 112 10*3/uL — ABNORMAL LOW (ref 150–400)
RBC: 3.57 x10E6/uL — ABNORMAL LOW (ref 4.14–5.80)
RBC: 3.77 MIL/uL — ABNORMAL LOW (ref 4.22–5.81)
RDW: 21 % — ABNORMAL HIGH (ref 11.6–15.4)
RDW: 24 % — ABNORMAL HIGH (ref 11.5–15.5)
WBC: 5.3 10*3/uL (ref 3.4–10.8)
WBC: 6 10*3/uL (ref 4.0–10.5)
nRBC: 0 % (ref 0.0–0.2)

## 2019-05-23 LAB — DIFFERENTIAL
Abs Immature Granulocytes: 0.02 10*3/uL (ref 0.00–0.07)
Basophils Absolute: 0.1 10*3/uL (ref 0.0–0.1)
Basophils Relative: 1 %
Eosinophils Absolute: 0.2 10*3/uL (ref 0.0–0.5)
Eosinophils Relative: 4 %
Immature Granulocytes: 0 %
Lymphocytes Relative: 17 %
Lymphs Abs: 1 10*3/uL (ref 0.7–4.0)
Monocytes Absolute: 0.6 10*3/uL (ref 0.1–1.0)
Monocytes Relative: 9 %
Neutro Abs: 4.1 10*3/uL (ref 1.7–7.7)
Neutrophils Relative %: 69 %

## 2019-05-23 LAB — LIPID PANEL
Chol/HDL Ratio: 2.5 ratio (ref 0.0–5.0)
Cholesterol, Total: 79 mg/dL — ABNORMAL LOW (ref 100–199)
HDL: 32 mg/dL — ABNORMAL LOW (ref >39)
LDL Chol Calc (NIH): 35 mg/dL (ref 0–99)
Triglycerides: 40 mg/dL (ref 0–149)
VLDL Cholesterol Cal: 12 mg/dL (ref 5–40)

## 2019-05-23 LAB — TYPE AND SCREEN
ABO/RH(D): A POS
Antibody Screen: NEGATIVE

## 2019-05-23 LAB — BASIC METABOLIC PANEL
Anion gap: 11 (ref 5–15)
BUN: 29 mg/dL — ABNORMAL HIGH (ref 8–23)
CO2: 24 mmol/L (ref 22–32)
Calcium: 9.3 mg/dL (ref 8.9–10.3)
Chloride: 107 mmol/L (ref 98–111)
Creatinine, Ser: 2.16 mg/dL — ABNORMAL HIGH (ref 0.61–1.24)
GFR calc Af Amer: 30 mL/min — ABNORMAL LOW (ref >60)
GFR calc non Af Amer: 26 mL/min — ABNORMAL LOW (ref >60)
Glucose, Bld: 175 mg/dL — ABNORMAL HIGH (ref 70–99)
Potassium: 4.6 mmol/L (ref 3.5–5.1)
Sodium: 142 mmol/L (ref 135–145)

## 2019-05-23 LAB — APTT: aPTT: 39 seconds — ABNORMAL HIGH (ref 24–36)

## 2019-05-23 LAB — PROTIME-INR
INR: 1.5 — ABNORMAL HIGH (ref 0.8–1.2)
Prothrombin Time: 18.2 seconds — ABNORMAL HIGH (ref 11.4–15.2)

## 2019-05-23 LAB — SEDIMENTATION RATE: Sed Rate: 10 mm/hr (ref 0–16)

## 2019-05-23 LAB — SARS CORONAVIRUS 2 (TAT 6-24 HRS): SARS Coronavirus 2: NEGATIVE

## 2019-05-23 LAB — C-REACTIVE PROTEIN: CRP: 0.9 mg/dL (ref <1.0)

## 2019-05-23 MED ORDER — ACETAMINOPHEN 500 MG PO TABS
1000.0000 mg | ORAL_TABLET | Freq: Once | ORAL | Status: AC
  Administered 2019-05-23: 1000 mg via ORAL
  Filled 2019-05-23: qty 2

## 2019-05-23 MED ORDER — METHOCARBAMOL 500 MG PO TABS
500.0000 mg | ORAL_TABLET | Freq: Once | ORAL | Status: AC
  Administered 2019-05-23: 18:00:00 500 mg via ORAL
  Filled 2019-05-23: qty 1

## 2019-05-23 NOTE — Assessment & Plan Note (Signed)
-   check CBC today. Result will guide further workup and management of anticoagulants - continue PO iron supplementation - discussed with patient's cardiologist Dr. Angelena Form. If anemia persists and we needed to stop a blood thinner in the future, Dr. Angelena Form would prefer we stop the plavix and continue on the xarelto at 15mg  daily.

## 2019-05-23 NOTE — Assessment & Plan Note (Signed)
Stable. Refill PPI and H2 blocker

## 2019-05-23 NOTE — ED Notes (Signed)
Patient verbalizes understanding of discharge instructions. Opportunity for questioning and answers were provided. Armband removed by staff, pt discharged from ED.  

## 2019-05-23 NOTE — Assessment & Plan Note (Signed)
Stable, refill flonase 

## 2019-05-23 NOTE — ED Triage Notes (Signed)
Pt endorses falling on Monday and hit back of head, LSN last night 1930, woke up with blurred vision in the left eye this morning. Denies dizziness. Bruising noted to posterior head and lumbar area. Pt is on eliquis and plavix. VSS. Axox4.

## 2019-05-23 NOTE — ED Notes (Signed)
Patient transported to MRI 

## 2019-05-23 NOTE — Discharge Instructions (Signed)
Your inflammatory markers were negative, this means is much less likely to have that vascular problem that I talked to you about.  Please follow-up with your ophthalmologist.  Please return to the ED for worsening difficulty with your vision one-sided weakness difficulty with speech or swallowing.

## 2019-05-23 NOTE — ED Provider Notes (Signed)
I received the patient in signout from Dr. Kathrynn Humble, briefly the patient is a 85 year old male with a chief complaints of change of his left side vision.  Sounds monocular by history.  Was severe at the onset and then has improved significantly.  Started last night.  Patient had an MRI of the brain that was negative for an acute infarct.  There is some concern for giant cell arteritis presented without a headache or jaw claudication and the plan was for inflammatory markers and reassessment.  The patient's inflammatory markers are negative.  On my exam he has no pain over the temporal artery.  He denies any jaw claudication or headache on repeat exam.  Will have the patient follow-up in the office.   Deno Etienne, DO 05/23/19 [phone]

## 2019-05-23 NOTE — ED Provider Notes (Addendum)
[facility] EMERGENCY DEPARTMENT Provider Note   CSN: [id number] Arrival date & time: 05/23/19  6295     History   Chief Complaint Chief Complaint  Patient presents with   Fall    HPI [name] is a 85 y.o. male.     HPI  85 year old male with history of stroke, CAD, CHF, chronic A. Fib, chronic ITP, CKD who is on Xarelto and Plavix comes in with chief complaint of left-sided vision change.  Patient had a fall on Monday.  He was doing fine yesterday but woke up this morning complaining of left-sided vision change and weakness in the lower extremity.  He usually walks with a cane.  The fall was mechanical, patient got dizzy when he got up and tripped. He denies any headache, neck pain, nausea, vomiting.    Past Medical History:  Diagnosis Date   Abnormal CT of the chest 07/21/2016   January 2018 IMPRESSION: 1. Residual patchy ground-glass opacities throughout both lungs at the resolved areas of consolidation from the 05/16/2016 chest CT, consistent with a resolving infectious or inflammatory process. 2. Bandlike opacity in the anterior right upper lobe is new since 05/16/2016, was probably present on 06/17/2016 chest radiograph, favor evolving postinfectious/postinflammatory    Allergic rhinitis 01/11/2019   Atrial arrhythmia 04/23/2013   Overview:  Abnormal ekg July 2013 c/f atrial flutter. Seen in the EP clinic. Event monitor showed sinus brady only with no pauses. Not felt to be a candidate for long term anti-coagulation 2/2 hematuria.    Benign prostate hyperplasia 10/04/2011   Bladder neck obstruction 10/04/2011   Cerebrovascular accident (CVA) ([place]) 08/09/2016   CHF (congestive heart failure), NYHA class II, chronic, diastolic ([place])    Echo 2841 LV with mild concentric hypertrophy, EF 55-60%, no regional wall motion abnormalities   Chronic atrial fibrillation 05/16/2016   Chronic ITP (idiopathic thrombocytopenia) (HCC)    Chronic kidney  disease, stage 4 (severe) ([place]) 05/15/2019   Congestive heart failure with left ventricular diastolic dysfunction ([place]) 05/25/2011   Overview:  TTE 02/02/12: Moderate focal basal hypertrophy of the ventricular septum. EF 55-60%. Grade 1 diastolic dysfunction. Mildly dilated LA and RA. Increased thickness of the atrial septum c/w lipomatous hypertrophy.    Coronary artery disease involving coronary bypass graft with angina pectoris ([place]) 05/25/2011   Overview:  Cardiac cath 03/21/08: Mild-moderate reduction in LV function with an anteroapical wall motion abnormality. Total occlusion of the mid LAD with successful percutaneous angioplasty and stenting using a non drug eluting stent. Successful stenting of a moderate proximal lesion of the LAD. Successful angioplasty of distal LAD stenosis. Total occlusion of the RCA with recollateralization of t   Coronary atherosclerosis of native coronary artery    a. Anterior STEMI 2009 s/p BMSx2 to mid & prox LAD and angioplasty to distal LAD. total RCA with collaterals, moderate Cx plaquing. a. EF 55-60% in 2013.   Degenerative disk disease 12/20/2010   Sees Dr. Eddie Dibbles.  Has had MRI.    Elevated troponin    Enlarged prostate with lower urinary tract symptoms (LUTS) 06/09/2011   GERD (gastroesophageal reflux disease) 01/11/2019   Gross hematuria    Gross hematuria 11/18/2008   Qualifier: Diagnosis of  By: Owens Shark, RN, BSN, [name]     Hemoptysis 05/16/2016   History of GI bleed 03/17/2018   Admitted 03/17/18 with acute GI bleeding. EGD showed gastritis but no active bleeding. Aspirin stopped and Plavix and Xareltro continued.    HTN (hypertension)  Hyperlipidemia 04/23/2013   Microscopic hematuria 09/28/2014   Myocardial infarct ([place]) 03/21/2008   NSTEMI (non-ST elevated myocardial infarction) ([place]) 05/16/2016   NSVT (nonsustained ventricular tachycardia) ([place])    a. Per DC summary from time of STEMI 2009.   Orthostatic hypotension 11/26/2016   PAD  (peripheral artery disease) ([place]) 08/13/2016   PAF (paroxysmal atrial fibrillation) (HCC)    Paroxysmal atrial flutter ([place])    a. Abnl EKG 01/2012 concerning for atrial flutter, event monitor showed sinus bradycardia only and no pauses. Not felt to be a candidate for longterm anticoag due to hematuria.   Persistent atrial fibrillation (HCC)    Phlebitis and thrombophlebitis of superficial vessels of lower extremities    Pure hypercholesterolemia    a. Has not tolerated statins in the past.   Right leg DVT ([place])    a. Dx 01/2014.   Sinus bradycardia    a. By prior event monitor.   Sinus bradycardia    a. By prior event monitor.    Thrombocytopenia ([place])    Urinary retention 06/20/2011    Patient Active Problem List   Diagnosis Date Noted   Gastritis and gastroduodenitis    Symptomatic anemia    Acute anemia 05/15/2019   Chronic kidney disease, stage 4 (severe) ([place]) 05/15/2019   GI bleed 05/15/2019   Near syncope    GERD (gastroesophageal reflux disease) 01/11/2019   Allergic rhinitis 01/11/2019   History of GI bleed 03/17/2018   Persistent atrial fibrillation (HCC)    Chronic ITP (idiopathic thrombocytopenia) (HCC)    Coronary atherosclerosis of native coronary artery    HTN (hypertension)    Hyperlipidemia 04/23/2013   Benign prostate hyperplasia 10/04/2011   Congestive heart failure with left ventricular diastolic dysfunction ([place]) 05/25/2011    Past Surgical History:  Procedure Laterality Date   BIOPSY  05/17/2019   Procedure: BIOPSY;  Surgeon: Thornton Park, MD;  Location: [facility];  Service: Gastroenterology;;   CARPAL TUNNEL RELEASE     CORONARY STENT INTERVENTION N/A 03/06/2018   Procedure: CORONARY STENT INTERVENTION;  Surgeon: Lorretta Harp, MD;  Location: [facility];  Service: Cardiovascular;  Laterality: N/A;   CORONARY STENT PLACEMENT  2009   ESOPHAGOGASTRODUODENOSCOPY (EGD) WITH PROPOFOL N/A 03/19/2018   Procedure:  ESOPHAGOGASTRODUODENOSCOPY (EGD) WITH PROPOFOL;  Surgeon: Wonda Horner, MD;  Location: [facility] ENDOSCOPY;  Service: Endoscopy;  Laterality: N/A;   ESOPHAGOGASTRODUODENOSCOPY (EGD) WITH PROPOFOL N/A 05/17/2019   Procedure: ESOPHAGOGASTRODUODENOSCOPY (EGD) WITH PROPOFOL;  Surgeon: Thornton Park, MD;  Location: [facility];  Service: Gastroenterology;  Laterality: N/A;   LEFT HEART CATH AND CORONARY ANGIOGRAPHY N/A 03/06/2018   Procedure: LEFT HEART CATH AND CORONARY ANGIOGRAPHY;  Surgeon: Lorretta Harp, MD;  Location: [place] [facility];  Service: Cardiovascular;  Laterality: N/A;   REPLACEMENT TOTAL KNEE BILATERAL     Left 2014 (Dr. Eddie Dibbles); right 2012 (Dr. Redmond Pulling)   trigger finger surgery          Home Medications    Prior to Admission medications   Medication Sig Start Date End Date Taking? Authorizing Provider  famotidine (PEPCID) 20 MG tablet TAKE 1 TABLET BY MOUTH TWICE A DAY Patient taking differently: Take 20 mg by mouth 2 (two) times daily.  01/05/19  Yes Leeanne Rio, MD  ferrous sulfate 325 (65 FE) MG tablet Take 1 tablet (325 mg total) by mouth 2 (two) times daily with a meal. 05/18/19 06/17/19 Yes Brimage, Ronnette Juniper, MD  fluticasone (FLONASE) 50 MCG/ACT nasal spray Place 2 sprays  into both nostrils daily as needed for allergies or rhinitis. 01/11/19  Yes Leeanne Rio, MD  furosemide (LASIX) 40 MG tablet Take 1 tablet (40 mg total) by mouth daily. Patient taking differently: Take 20 mg by mouth daily.  01/15/19 01/10/20 Yes Burnell Blanks, MD  gabapentin (NEURONTIN) 300 MG capsule Take 300 mg by mouth at bedtime.   Yes [provider]  loratadine (CLARITIN) 10 MG tablet Take 10 mg by mouth daily as needed for allergies.    Yes [provider]  methocarbamol (ROBAXIN) 500 MG tablet Take 500 mg by mouth 2 (two) times daily as needed for muscle spasms.  05/10/19  Yes [provider]  metoprolol tartrate (LOPRESSOR) 25 MG tablet Take 0.5  tablets (12.5 mg total) by mouth 2 (two) times daily. 05/04/19  Yes Burnell Blanks, MD  pantoprazole (PROTONIX) 40 MG tablet Take 1 tablet (40 mg total) by mouth 2 (two) times daily. 01/11/19  Yes Leeanne Rio, MD  Polyethyl Glyc-Propyl Glyc PF (SYSTANE ULTRA PF) 0.4-0.3 % SOLN Place 1-2 drops into both eyes every morning.   Yes [provider]  potassium chloride SA (K-DUR) 20 MEQ tablet Take 20 mEq by mouth daily.   Yes [provider]  pravastatin (PRAVACHOL) 20 MG tablet Take 1 tablet (20 mg total) by mouth daily at 6 PM. 01/11/19  Yes Leeanne Rio, MD  pregabalin (LYRICA) 75 MG capsule Take 1 capsule (75 mg total) by mouth 2 (two) times daily. 05/18/16  Yes Asencion Partridge, MD  Rivaroxaban (XARELTO) 15 MG TABS tablet Take 1 tablet (15 mg total) by mouth daily with supper. 04/30/19  Yes Burnell Blanks, MD  senna (SENOKOT) 8.6 MG TABS tablet Take 1 tablet (8.6 mg total) by mouth daily as needed for mild constipation. 04/14/18  Yes Lockamy, Christia Reading, DO  clopidogrel (PLAVIX) 75 MG tablet TAKE 1 TABLET (75 MG TOTAL) BY MOUTH DAILY WITH BREAKFAST. 08/29/18   Burnell Blanks, MD  nitroGLYCERIN (NITROSTAT) 0.4 MG SL tablet Place 0.4 mg under the tongue every 5 (five) minutes as needed for chest pain.    [provider]    Family History Family History  Problem Relation Age of Onset   Cancer Sister    Cancer Sister     Social History Social History   Tobacco Use   Smoking status: Never Smoker   Smokeless tobacco: Never Used  Substance Use Topics   Alcohol use: No   Drug use: No     Allergies   Prednisone and Rosuvastatin   Review of Systems Review of Systems  Constitutional: Positive for activity change.  Eyes: Positive for visual disturbance.  Neurological: Positive for weakness.  Hematological: Bruises/bleeds easily.  All other systems reviewed and are negative.    Physical Exam Updated Vital Signs BP  109/65    Pulse 75    Temp 98 F (36.7 C) (Oral)    Resp 14    SpO2 100%   Physical Exam Vitals signs and nursing note reviewed.  Constitutional:      Appearance: He is well-developed.  HENT:     Head: Normocephalic and atraumatic.  Eyes:     Extraocular Movements: Extraocular movements intact.     Conjunctiva/sclera: Conjunctivae normal.     Pupils: Pupils are equal, round, and reactive to light.  Neck:     Musculoskeletal: Normal range of motion and neck supple.  Cardiovascular:     Rate and Rhythm: Normal rate and regular rhythm.  Heart sounds: Normal heart sounds.  Pulmonary:     Effort: Pulmonary effort is normal. No respiratory distress.     Breath sounds: Normal breath sounds. No wheezing.  Abdominal:     General: Bowel sounds are normal. There is no distension.     Palpations: Abdomen is soft.     Tenderness: There is no abdominal tenderness. There is no guarding or rebound.  Skin:    General: Skin is warm.  Neurological:     Mental Status: He is alert and oriented to person, place, and time.     Comments: Patient has subjective weakness of the left lower extremity, however strength is still 4+ out of 5 and patient was able to ambulate.  Left upper extremity strength is normal. Gross sensory exam is normal. EOMI. Bedside visual acuity exam-patient was able to finger count.       ED Treatments / Results  Labs (all labs ordered are listed, but only abnormal results are displayed) Labs Reviewed  PROTIME-INR - Abnormal; Notable for the following components:      Result Value   Prothrombin Time 18.2 (*)    INR 1.5 (*)    All other components within normal limits  APTT - Abnormal; Notable for the following components:   aPTT 39 (*)    All other components within normal limits  CBC - Abnormal; Notable for the following components:   RBC 3.77 (*)    Hemoglobin 8.7 (*)    HCT 30.6 (*)    MCH 23.1 (*)    MCHC 28.4 (*)    RDW 24.0 (*)    Platelets 112 (*)     All other components within normal limits  BASIC METABOLIC PANEL - Abnormal; Notable for the following components:   Glucose, Bld 175 (*)    BUN 29 (*)    Creatinine, Ser 2.16 (*)    GFR calc non Af Amer 26 (*)    GFR calc Af Amer 30 (*)    All other components within normal limits  SARS CORONAVIRUS 2 (TAT 6-24 HRS)  DIFFERENTIAL  SEDIMENTATION RATE  C-REACTIVE PROTEIN  CBG MONITORING, ED  TYPE AND SCREEN    EKG EKG Interpretation  Date/Time:  Wednesday May 23 2019 08:43:57 EST Ventricular Rate:  66 PR Interval:    QRS Duration: 102 QT Interval:  428 QTC Calculation: 448 R Axis:   -102 Text Interpretation: Atrial fibrillation Low voltage QRS Incomplete right bundle branch block Possible Anterolateral infarct , age undetermined Abnormal ECG No acute changes No significant change since last tracing Confirmed by Varney Biles [phone]) on 05/23/2019 9:04:08 AM   Radiology Ct Head Wo Contrast  Result Date: 05/23/2019 CLINICAL DATA:  Possible stroke. Focal neurologic deficit. EXAM: CT HEAD WITHOUT CONTRAST TECHNIQUE: Contiguous axial images were obtained from the base of the skull through the vertex without intravenous contrast. COMPARISON:  03/17/2019 FINDINGS: Brain: No evidence of acute infarction, hemorrhage, hydrocephalus, extra-axial collection or mass lesion/mass effect. Multifocal areas of encephalomalacia are identified involving the bilateral cerebral hemispheres. This includes an old right parietal MCA distribution infarct,a inferior right frontal lobe and posterior left temporal lobe infarcts. Old left occipital lobe infarct is also again noted. There is mild diffuse low-attenuation within the subcortical and periventricular white matter compatible with chronic microvascular disease. Prominence of the sulci and ventricles are identified compatible with brain atrophy. Vascular: Vertebral artery atherosclerotic calcifications noted. Skull: Normal. Negative for fracture or  focal lesion. Sinuses/Orbits: Small filling defects within bilateral maxillary sinuses  are identified which may reflect polyps or retention cysts. No sinus fluid levels. Mastoid air cells clear. Other: IMPRESSION: 1. No acute intracranial abnormalities. 2. Chronic small vessel ischemic disease and brain atrophy. 3. Chronic bilateral cerebral hemisphere infarcts. Electronically Signed   By: Kerby Moors M.D.   On: 05/23/2019 09:20   [name] Contrast  Result Date: 05/23/2019 CLINICAL DATA:  Left-sided vision change EXAM: MRI HEAD WITHOUT CONTRAST TECHNIQUE: Multiplanar, multiecho pulse sequences of the brain and surrounding structures were obtained without intravenous contrast. COMPARISON:  March 22, 2018 FINDINGS: Brain: There is no acute infarction or intracranial hemorrhage. Multiple areas of chronic infarction again identified including involvement of the right frontoparietal lobes, left lateral temporal lobe, and bilateral parietooccipital lobes. Additional patchy T2 hyperintensity in the supratentorial white matter likely reflects stable chronic microvascular ischemic changes. Prominence of the ventricles and sulci reflects stable parenchymal volume loss. There is no intracranial mass, mass effect, or edema. There is no hydrocephalus or extra-axial fluid collection. Vascular: Major vessel flow voids at the skull base are preserved. Skull and upper cervical spine: No suspicious osseous lesion. Sinuses/Orbits: Mild paranasal sinus mucosal thickening. Bilateral lens replacements. Other: Mastoid air cells are clear.  Sella is unremarkable. IMPRESSION: No evidence of recent infarction, intracranial hemorrhage, or mass. Stable chronic findings detailed above. Electronically Signed   By: Macy Mis M.D.   On: 05/23/2019 15:15    Procedures Procedures (including critical care time)  Medications Ordered in ED Medications  methocarbamol (ROBAXIN) tablet 500 mg (500 mg Oral Given 05/23/19 1758)    acetaminophen (TYLENOL) tablet 1,000 mg (1,000 mg Oral Given 05/23/19 1757)     Initial Impression / Assessment and Plan / ED Course  I have reviewed the triage vital signs and the nursing notes.  Pertinent labs & imaging results that were available during my care of the patient were reviewed by me and considered in my medical decision making (see chart for details).  Clinical Course as of May 23 1318  Wed May 23, 2019  0953 CT scan does not show any acute findings.  MRI ordered.  Results discussed with the patient and his daughter.  They will be awaiting for the MRI to be completed.  He will need admission if MRI is positive for stroke.   [AN]    Clinical Course User Index [AN] Varney Biles, MD       DDx includes: - Mechanical falls - ICH - Fractures  Patient comes in a chief complaint of fall.  He is on blood thinners.  New left-sided weakness in the lower extremity and vision change. Brain bleed is in the differential given that he is on blood thinners. Acute stroke is possible as well.  Last normal last night.  LVO screen is negative.  Reassessment: MRI results discussed with the patient.  There is no evidence of acute stroke.  At this time we will add sed rate and CRP because we would like to rule out atypical presentation of temporal arteritis given the monocular vision loss. Dr. Tyrone Nine to follow-up on the results.  Patient has been advised to call his ophthalmology and set up a quick appointment.  He has already secured an appointment for this Friday morning.  Nonemergent causes for change in vision possible including possible mild retinal detachment and other macular issues.   Final Clinical Impressions(s) / ED Diagnoses   Final diagnoses:  Visual changes    ED Discharge Orders    None  Varney Biles, MD 05/23/19 9597    Varney Biles, MD 05/23/19 4718    Varney Biles, MD 05/24/19 1321

## 2019-05-25 ENCOUNTER — Telehealth: Payer: Self-pay | Admitting: Family Medicine

## 2019-05-25 ENCOUNTER — Encounter: Payer: Self-pay | Admitting: Family Medicine

## 2019-05-25 ENCOUNTER — Other Ambulatory Visit: Payer: Self-pay

## 2019-05-25 ENCOUNTER — Ambulatory Visit: Payer: Medicare Other | Admitting: Family Medicine

## 2019-05-25 VITALS — Ht 70.0 in | Wt 179.5 lb

## 2019-05-25 DIAGNOSIS — D649 Anemia, unspecified: Secondary | ICD-10-CM

## 2019-05-25 DIAGNOSIS — H539 Unspecified visual disturbance: Secondary | ICD-10-CM | POA: Diagnosis not present

## 2019-05-25 DIAGNOSIS — S60411A Abrasion of left index finger, initial encounter: Secondary | ICD-10-CM | POA: Diagnosis not present

## 2019-05-25 NOTE — Progress Notes (Signed)
    Subjective:  [name] is a 85 y.o. male who presents to the [facility] today with a chief complaint of burning of left index finger.   HPI:  Patient is brought in by his son.  Left index finger Patient presents today for burning of his left finger.  Patient fell approximately on 11/9.  Has multiple skin tears on his hands and arms.  Did have some prolonged bleeding.  Patient is on Xarelto and Plavix.  Bleeding is stopped after it was bandaged. Patient said that he had a burning in his finger that started last night.  It is since stopped burning.  Denies any fevers or chills.  WBC 2 days ago was normal.  Anemia Patient with chronic anemia.  There was discussion about stopping his anticoagulation if it persisted.  The patient had some bleeding today.  His now stopped.  Patient CBC yesterday shows that his anemia is stable from earlier this week.  Hemoglobin is around low to mid eights.  Vision changes Patient was seen in the ED yesterday for concern for vision changes in his right eye.  He reports that he has normal vision now in his right eye.  He had head CT and MR brain that did not show any acute findings.  Also had CRP and ESR drawn were normal.  ROS: Per HPI  PMH: Smoking history reviewed.   Objective:  Physical Exam: Ht '5\' 10"'$  (1.778 m)   Wt 179 lb 8 oz (81.4 kg)   BMI 25.76 kg/m   Gen: NAD, resting comfortably HEENT: EOMI, PERRLA Left Hand: Hand covered in multiple bandages, these were delicately removed.  Has 2 skin tears at the base of his left index finger.  Has some mild abrasions over the dorsal aspect of the base of the finger, but skin there seems intact.  There is some mild erythema,.  Is not warm.  There is no tenderness over any of the joints.  Skin tears are hemostatic.  See picture below.  Patient has normal grip strength and range of motion fingers. Neuro: grossly normal, moves all extremities Psych: Normal affect and thought content         Assessment/Plan:  Anemia Anemia.  Stable.  Patient is hemostatic although has multiple skin abrasions.  Patient is on Plavix and Xarelto.  Patient is also on iron supplements.  There was discussion decreasing his Xarelto to 15 mg daily and stopping his Plavix if anemia persisted.  Given that his anemia is stable, patient follow-up with PCP regarding discussion of changing his medications.  Abrasion of left index finger Patient presenting with abrasion over the left index finger on the dorsal side.  There also skin tears near this.  Nontender to palpation.  No systemic signs.  Recent WC, CRP and ESR were normal.  Do not believe patient has signs of infection.  Recommend loose bandages without aggressive adhesion to prevent further skin tears.  Patient follow-up if he develops signs of infection.  Vision changes Patient with acute vision change in the right eye 2 days ago.  Patient is now returned to normal.  Was seen in ED on 11/11.  Had normal head imaging.  ESR and CRP normal making GCA unlikely.  Continue to monitor.   Lab Orders  No laboratory test(s) ordered today    No orders of the defined types were placed in this encounter.     Marny Lowenstein, MD, MS FAMILY MEDICINE RESIDENT - PGY3 05/25/2019 12:56 PM

## 2019-05-25 NOTE — Telephone Encounter (Signed)
Allen at [phone] from [facility] requesting verbal orders for PT, one week one, two week two, one times a week for two more weeks.

## 2019-05-25 NOTE — Assessment & Plan Note (Signed)
Patient presenting with abrasion over the left index finger on the dorsal side.  There also skin tears near this.  Nontender to palpation.  No systemic signs.  Recent WC, CRP and ESR were normal.  Do not believe patient has signs of infection.  Recommend loose bandages without aggressive adhesion to prevent further skin tears.  Patient follow-up if he develops signs of infection.

## 2019-05-25 NOTE — Patient Instructions (Signed)
It was a pleasure to see you today! Thank you for choosing [facility] for your primary care. [name] was seen for left hand pain.  It looks like you have injured your hand in the fall, but does not look infected.  Please avoid regular Band-Aids and just use a loose wrap over the area.  Please follow-up if you develop any symptoms of worsening pain, fevers, chills, increased redness or swelling in the joint.     Best,  Marny Lowenstein, MD, MS FAMILY MEDICINE RESIDENT - PGY1 05/25/2019 10:00 AM

## 2019-05-25 NOTE — Assessment & Plan Note (Signed)
Patient with acute vision change in the right eye 2 days ago.  Patient is now returned to normal.  Was seen in ED on 11/11.  Had normal head imaging.  ESR and CRP normal making GCA unlikely.  Continue to monitor.

## 2019-05-25 NOTE — Assessment & Plan Note (Signed)
Anemia.  Stable.  Patient is hemostatic although has multiple skin abrasions.  Patient is on Plavix and Xarelto.  Patient is also on iron supplements.  There was discussion decreasing his Xarelto to 15 mg daily and stopping his Plavix if anemia persisted.  Given that his anemia is stable, patient follow-up with PCP regarding discussion of changing his medications.

## 2019-05-28 NOTE — Telephone Encounter (Signed)
Please authorize orders, thanks Alan Branam J Bolton Canupp, MD  

## 2019-05-28 NOTE — Telephone Encounter (Signed)
Allen informed of verbal orders. Deseree Kennon Holter, CMA

## 2019-06-01 ENCOUNTER — Ambulatory Visit (INDEPENDENT_AMBULATORY_CARE_PROVIDER_SITE_OTHER): Payer: Medicare Other | Admitting: Family Medicine

## 2019-06-01 ENCOUNTER — Other Ambulatory Visit: Payer: Self-pay

## 2019-06-01 ENCOUNTER — Encounter: Payer: Self-pay | Admitting: Family Medicine

## 2019-06-01 DIAGNOSIS — S51819A Laceration without foreign body of unspecified forearm, initial encounter: Secondary | ICD-10-CM | POA: Insufficient documentation

## 2019-06-01 DIAGNOSIS — S51811A Laceration without foreign body of right forearm, initial encounter: Secondary | ICD-10-CM | POA: Diagnosis not present

## 2019-06-01 HISTORY — DX: Laceration without foreign body of unspecified forearm, initial encounter: S51.819A

## 2019-06-01 NOTE — Progress Notes (Signed)
   Subjective:    Patient ID: [name], male    DOB: [birth year], 85 y.o.   MRN: [id number]   CC: [place] and hurt his arm  HPI: [name] is a 86 year old gentleman with CAD, hypertension, persistent A. fib, CHF, chronic ITP with anemia presenting discuss the following:  Fall with arm pain:   Smoking status reviewed  Review of Systems Per HPI    Objective:  There were no vitals taken for this visit. Vitals and nursing note reviewed  General: NAD, pleasant Cardiac: RRR, normal heart sounds, no murmurs Respiratory: CTAB, normal effort Abdomen: soft, nontender, nondistended Extremities: no edema or cyanosis. WWP. Skin: warm and dry, no rashes noted Neuro: alert and oriented, no focal deficits Psych: normal affect  Assessment & Plan:    No problem-specific Assessment & Plan notes found for this encounter.    [facility] Resident PGY-2

## 2019-06-01 NOTE — Patient Instructions (Signed)
Overall, great job managing this bleeding at home before coming to clinic.  It looks like his bleeding is significantly improved with the pressure dressing.  I do not think it is necessary to hold any of his blood thinners at this time.  Keep his current Ace bandage on for the next day and change his dressing in 24 hours.  Check that dressing again 24 hours later.  If he is still having some minor oozing, give [place] a call and we can consider holding his Plavix.  I do not think it will be necessary to stop any of his medication because he looks so good in clinic today.  With regard to any concern for fluid overload, I think that he looks and sounds good today.  Based on his lack of shortness of breath at rest or with lying down, I do not think that he has significant fluid overload.  Make sure that you are continuing Lasix 40 mg daily at home.  This was the most recent cardiology recommendation to control his weight and fluid status.

## 2019-06-01 NOTE — Progress Notes (Signed)
Subjective:  [name] is a 85 y.o. male who presents to the [facility] today with a chief complaint of prolonged bleeding of a right forearm cut on blood thinners.   HPI: Laceration of right forearm [name] is here with his son who provides majority of the history.  [name] appears to have torn some skin on his right forearm 2 days ago while attempting to sit in the chair.  There is no other significant injury at the time.  Since the skin tear, family has had difficulty trying to control his bleeding.  Son denies any traumatic blood loss no evidence of fluid blood on the floor significantly bloody cheats.  The main problem is persistent oozing.  His daughter has been involved in keeping his arm bandaged with a little bit of pressure in order to stop the bleeding.  They did not notice any significant improvement since Wednesday and came to clinic today to have him assessed to see if a blood thinner should be held to help with his bleeding.  Mild weight gain The son notes that [name] reports his weights daily at home on a scale.  For the past several days his weight has been steady at 180 pounds.  The son noticed that the scale in clinic today read 182 pounds.  The son wanted to know if this meant that [name] was experiencing some volume overload and needs more diuresis.  [name] reports no difficulty breathing at rest or with lying down.  No chest pain noted.   Chief Complaint noted Review of Symptoms - see HPI PMH -history of CAD status post CABG.  On Plavix and Xarelto.  Objective:  Physical Exam: BP 110/70   Ht 5\' 10"  (1.778 m)   Wt 182 lb 4 oz (82.7 kg)   BMI 26.15 kg/m    Gen: NAD, resting comfortably seated in the chair. CV: Regular rate.  Irregular rhythm.  No S3 or S4 appreciated.  No elevated JVD. Pulm: Breathing comfortably on room air.  Good air movement and no rales appreciated on left side.  Rales noted in the left lower field with good  air movement. Extremities: 1+ pitting edema up to the knees.  Warm, well perfused.  Mild hyperpigmentation and well-healing scabs noted. Right forearm: Bandages taken off and pharmacist.  No evidence of erythema or hematoma formation.  2 cm x 2 cm skin tear with some dried crusted blood.  No purulence.  Mild bloody oozing.  Sensation intact to left hand with good finger mobility.  Radial pulse intact.  No results found for this or any previous visit (from the past 72 hour(s)).   Assessment/Plan:  Skin tear of forearm without complication Bleeding is well controlled at present.  While on bandaging his arm, the son noted that it seems to have significantly improved since yesterday.  Only mild oozing noted on exam today.  Repackaged with gauze, Kerlix and Ace wrap.  Due to improvement in bleeding, I do not think it is necessary to hold any blood thinners. -arm rebandaged with Ace wrap to provide pressure dressing.  Leave current pressure dressing in place for the next 24 hours. -Additional bandages provided for 1 dressing change.  Change dressing in 24 hours. -If oozing persists after 48 hours, call clinic and will consider holding Plavix. -Overall, centigrade addressing this bleeding at home.  The wound appears well-healing without concern for infection at this time. -Encouraged to schedule a telemedicine visit in 1 week  to ensure good healing.  Possible weight gain Low suspicion for significant volume overload based on 2 pound difference in between home and clinic scales.  I encouraged [name] son to continue using their home scale as a baseline.  If they have not noticed any significant changes using their home scale daily and I do not think we should make any changes today based off of the readings from a different scale.  [name] denied any changes in his respiratory status at rest or with lying down.  The son notes that he does use compression hose regularly at home does typically  have a little extra fluid in his lower extremities.  Skin changes noted on physical exam are consistent with a chronic venous stasis.  [name] does appear to have some extra fluid on his exam but I am hesitant to change his diuretics if he has had a stable weight based on her home measurements.  I suspect he may always carry a little extra fluid on him.  Previous cardiology note reviewed and ensure that [name] is receiving his appropriate Lasix regimen at home which is Lasix 40 mg daily.

## 2019-06-01 NOTE — Assessment & Plan Note (Addendum)
Bleeding is well controlled at present.  While on bandaging his arm, the son noted that it seems to have significantly improved since yesterday.  Only mild oozing noted on exam today.  Repackaged with gauze, Kerlix and Ace wrap.  Due to improvement in bleeding, I do not think it is necessary to hold any blood thinners. -arm rebandaged with Ace wrap to provide pressure dressing.  Leave current pressure dressing in place for the next 24 hours. -Additional bandages provided for 1 dressing change.  Change dressing in 24 hours. -If oozing persists after 48 hours, call clinic and will consider holding Plavix. -Overall, centigrade addressing this bleeding at home.  The wound appears well-healing without concern for infection at this time. -Encouraged to schedule a telemedicine visit in 1 week to ensure good healing.

## 2019-06-04 ENCOUNTER — Encounter: Payer: Self-pay | Admitting: Physician Assistant

## 2019-06-04 NOTE — Progress Notes (Addendum)
Virtual Visit via Telephone Note   This visit type was conducted due to national recommendations for restrictions regarding the COVID-19 Pandemic (e.g. social distancing) in an effort to limit this patient's exposure and mitigate transmission in our community.  Due to his co-morbid illnesses, this patient is at least at moderate risk for complications without adequate follow up.  This format is felt to be most appropriate for this patient at this time.  The patient did not have access to video technology/had technical difficulties with video requiring transitioning to audio format only (telephone).  All issues noted in this document were discussed and addressed.  No physical exam could be performed with this format.  The patient agreed to consent to telehealth with [facility]. Virtual platform was offered given ongoing worsening Covid-19 pandemic.  Date:  06/05/2019   ID:  [name], DOB [birth year], MRN [id number]  Patient Location: Home Provider Location: Home  PCP:  Leeanne Rio, MD  Cardiologist:  Lauree Chandler, MD  Electrophysiologist:  None   Evaluation Performed:  Follow-Up Visit  Chief Complaint:  F/u hospital stay for anemia  History of Present Illness:    [name] is a 85 y.o. male with CAD (anterior MI 2009 s/p LAD stenting with occluded RCA, NSTEMI 02/2018 s/p DES to OM), ischemic cardiomyopathy, chronic combined CHF, hyperlipidemia, persistent atrial fibrillation/flutter, DVT, CVA, GI bleeding/gastritis, chronic venous stasis dermatitis, CKD stage IV, chronic back pain, ITP, GERD who is seen virtually for cardiology follow-up.  He is known to have CAD with first cardiac cath in 2009 when he had 2 BMS to mid LAD. His RCA was occluded then. He had a DVT in 2015 and has been on Xarelto. He also has had paroxysmal AF/AFL, now persistent. He was admitted to [facility] November 2017 with pneumonia, hemoptysis and CHF requiring diuresis. His Xarelto  was stopped due to hemoptysis and anemia but he then had a stroke in January 2018 so Xarelto was restarted. EF was normal. He was admitted to [facility] in August 2019 with a NSTEMI. Cardiac cath 03/06/18 showed chronically occluded RCA, patent LAD stents and severe stenosis in the obtuse marginal branch treated with a drug eluting stent. Echo 03/06/18 showed LVEF 30-35%, moderate LVH, mildly increased PASP, mild LAE, mild MR. He was discharged on ASA, Plavix and Xarelto with plans for stopping ASA at one month. He was then admitted September 2019 with acute GI bleeding. EGD showed gastritis but no active bleeding. His ASA was stopped after one month. He has been maintained on Plavix and Xarelto. It appears his Xarelto dose had been listed as 2.5mg  BID after his GI bleeding event. He has also seen by Neurology due to dizziness in the past with unrevealing testing.  He was seen in our office in February 2020 with c/o lower extremity edema and left calf pain. LE duplex was negative. His edema resolved with Lasix. Phone notes from 04/2019 indicate his Xarelto 2.5mg  BID was changed to Eliquis 2.5mg  BID but then he had some bleeding into his eye so Eliquis was discontinued and he was changed to Xarelto 15mg  daily. He was then admitted May 15, 2019 for symptomatic anemia due to GIB with Hgb 6.7. GI was consulted and performed an EGD that did not indicate source for an active bleed. Patient was felt too high risk for colonoscopy. CT ABD/pelvis indicated colonic diverticulosis without definite bowel wall thickening or mass.  Patient's iron studies showed concern for significant iron deficiency anemia. GI recommended patient  have oral iron repletion. His Plavix and Xarelto were resumed. There was some concern he may have been taking Xarelto 15 mg BID on admission but daughter refutes this. Of note, he was seen back in the ED 05/23/19 with transient monocular vision change. MRI showed no acute stroke. Inflammatory markers were  negative and he was advised to f/u with ophthalmology. His family says he has since discussed this with primary care and ophthalmology who do not feel it is of acute concern. Last labs 05/2019 showed Cr 2.16, Hgb 8.7, Plt 112, LDL 35, 2019 AST/ALT OK, Tbili 1.8. His CHF medication regimen has been limited due to hypotension and CKD (therefore not on ACEi, ARB, spiro or ARNI due to this).  He is accompanied by telephone with his daughters [name] and [name] today. In general he is holding his own. They are worried that he tends to bleed so easily with minimal cuts or bumps. He has not had any recurrent GIB. No CP or SOB. He is having occasional falls. [name] also has been periodically giving him an extra Lasix for increased leg swelling (typically L>R) and weight gain. He seems to be holding around 175-180lb recently at home. [name] states he was a little lower leaving the hospital recently but that was due to being acutely ill and not eating well. His appetite has picked back up.  The patient does not have symptoms concerning for COVID-19 infection (fever, chills, cough, or new shortness of breath).    Past Medical History:  Diagnosis Date   Abnormal CT of the chest 07/21/2016   January 2018 IMPRESSION: 1. Residual patchy ground-glass opacities throughout both lungs at the resolved areas of consolidation from the 05/16/2016 chest CT, consistent with a resolving infectious or inflammatory process. 2. Bandlike opacity in the anterior right upper lobe is new since 05/16/2016, was probably present on 06/17/2016 chest radiograph, favor evolving postinfectious/postinflammatory    Allergic rhinitis 01/11/2019   Benign prostate hyperplasia 10/04/2011   Bladder neck obstruction 10/04/2011   Cerebrovascular accident (CVA) ([place]) 08/09/2016   Chronic combined systolic (congestive) and diastolic (congestive) heart failure (HCC)    Chronic ITP (idiopathic thrombocytopenia) (HCC)    Chronic kidney disease, stage 4  (severe) ([place]) 05/15/2019   Coronary atherosclerosis of native coronary artery    a. Anterior STEMI 2009 s/p BMSx2 to mid & prox LAD and angioplasty to distal LAD. total RCA with collaterals, moderate Cx plaquing. a. NSTEMI 02/2018 - chronically occluded RCA, patent LAD stents and severe stenosis in the obtuse marginal branch treated with a drug eluting stent.   Degenerative disk disease 12/20/2010   Sees Dr. Eddie Dibbles.  Has had MRI.    Enlarged prostate with lower urinary tract symptoms (LUTS) 06/09/2011   GERD (gastroesophageal reflux disease) 01/11/2019   Gross hematuria    Hemoptysis 05/16/2016   History of GI bleed 03/17/2018   Admitted 03/17/18 with acute GI bleeding. EGD showed gastritis but no active bleeding. Aspirin stopped and Plavix and Xareltro continued.    HTN (hypertension)    Hyperlipidemia 04/23/2013   Hypotension    Iron deficiency anemia    Ischemic cardiomyopathy    Microscopic hematuria 09/28/2014   Myocardial infarct ([place]) 03/21/2008   NSTEMI (non-ST elevated myocardial infarction) ([place]) 05/16/2016   NSVT (nonsustained ventricular tachycardia) ([place])    a. Per DC summary from time of STEMI 2009.   Orthostatic hypotension 11/26/2016   PAD (peripheral artery disease) ([place]) 08/13/2016   Paroxysmal atrial flutter (HCC)    Persistent atrial fibrillation (  [place])    Phlebitis and thrombophlebitis of superficial vessels of lower extremities    Pure hypercholesterolemia    a. Has not tolerated statins in the past.   Right leg DVT ([place])    a. Dx 01/2014.   Sinus bradycardia    a. By prior event monitor.    Thrombocytopenia ([place])    Urinary retention 06/20/2011   Past Surgical History:  Procedure Laterality Date   BIOPSY  05/17/2019   Procedure: BIOPSY;  Surgeon: Thornton Park, MD;  Location: [facility] ENDOSCOPY;  Service: Gastroenterology;;   CARPAL TUNNEL RELEASE     CORONARY STENT INTERVENTION N/A 03/06/2018   Procedure: CORONARY STENT INTERVENTION;  Surgeon:  Lorretta Harp, MD;  Location: [place] CV LAB;  Service: Cardiovascular;  Laterality: N/A;   CORONARY STENT PLACEMENT  2009   ESOPHAGOGASTRODUODENOSCOPY (EGD) WITH PROPOFOL N/A 03/19/2018   Procedure: ESOPHAGOGASTRODUODENOSCOPY (EGD) WITH PROPOFOL;  Surgeon: Wonda Horner, MD;  Location: [facility] ENDOSCOPY;  Service: Endoscopy;  Laterality: N/A;   ESOPHAGOGASTRODUODENOSCOPY (EGD) WITH PROPOFOL N/A 05/17/2019   Procedure: ESOPHAGOGASTRODUODENOSCOPY (EGD) WITH PROPOFOL;  Surgeon: Thornton Park, MD;  Location: [place];  Service: Gastroenterology;  Laterality: N/A;   LEFT HEART CATH AND CORONARY ANGIOGRAPHY N/A 03/06/2018   Procedure: LEFT HEART CATH AND CORONARY ANGIOGRAPHY;  Surgeon: Lorretta Harp, MD;  Location: [place] CV LAB;  Service: Cardiovascular;  Laterality: N/A;   REPLACEMENT TOTAL KNEE BILATERAL     Left 2014 (Dr. Eddie Dibbles); right 2012 (Dr. Redmond Pulling)   trigger finger surgery       Current Meds  Medication Sig   clopidogrel (PLAVIX) 75 MG tablet TAKE 1 TABLET (75 MG TOTAL) BY MOUTH DAILY WITH BREAKFAST.   famotidine (PEPCID) 20 MG tablet TAKE 1 TABLET BY MOUTH TWICE A DAY (Patient taking differently: Take 20 mg by mouth 2 (two) times daily. )   ferrous sulfate 325 (65 FE) MG tablet Take 1 tablet (325 mg total) by mouth 2 (two) times daily with a meal.   fluticasone (FLONASE) 50 MCG/ACT nasal spray Place 2 sprays into both nostrils daily as needed for allergies or rhinitis.   gabapentin (NEURONTIN) 300 MG capsule Take 300 mg by mouth at bedtime.   loratadine (CLARITIN) 10 MG tablet Take 10 mg by mouth daily as needed for allergies.    methocarbamol (ROBAXIN) 500 MG tablet Take 500 mg by mouth 2 (two) times daily as needed for muscle spasms.    metoprolol tartrate (LOPRESSOR) 25 MG tablet Take 0.5 tablets (12.5 mg total) by mouth 2 (two) times daily.   nitroGLYCERIN (NITROSTAT) 0.4 MG SL tablet Place 0.4 mg under the tongue every 5 (five) minutes as needed for  chest pain.   pantoprazole (PROTONIX) 40 MG tablet Take 1 tablet (40 mg total) by mouth 2 (two) times daily.   Polyethyl Glyc-Propyl Glyc PF (SYSTANE ULTRA PF) 0.4-0.3 % SOLN Place 1-2 drops into both eyes every morning.   potassium chloride SA (K-DUR) 20 MEQ tablet Take 20 mEq by mouth daily.   pravastatin (PRAVACHOL) 20 MG tablet Take 1 tablet (20 mg total) by mouth daily at 6 PM.   pregabalin (LYRICA) 75 MG capsule Take 1 capsule (75 mg total) by mouth 2 (two) times daily.   Rivaroxaban (XARELTO) 15 MG TABS tablet Take 1 tablet (15 mg total) by mouth daily with supper.   senna (SENOKOT) 8.6 MG TABS tablet Take 1 tablet (8.6 mg total) by mouth daily as needed for mild constipation.    furosemide (LASIX) 40  MG tablet Take 1 tablet (40 mg total) by mouth daily.     Allergies:   Prednisone and Rosuvastatin   Social History   Tobacco Use   Smoking status: Never Smoker   Smokeless tobacco: Never Used  Substance Use Topics   Alcohol use: No   Drug use: No     Family Hx: The patient's family history includes Cancer in his sister and sister.  ROS:   Please see the history of present illness.    All other systems reviewed and are negative.   Prior CV studies:    Most recent pertinent cardiac studies are outlined above.  Labs/Other Tests and Data Reviewed:    EKG:  An ECG dated 05/23/19 was personally reviewed today and demonstrated:  atrial fib 66bpm, IRBBB, possible prior anterolateral infarct, TWI I, avL  Recent Labs: 05/15/2019: B Natriuretic Peptide 311.0 05/23/2019: BUN 29; Creatinine, Ser 2.16; Hemoglobin 8.7; Platelets 112; Potassium 4.6; Sodium 142   Recent Lipid Panel Lab Results  Component Value Date/Time   CHOL 79 (L) 05/22/2019 10:33 AM   TRIG 40 05/22/2019 10:33 AM   HDL 32 (L) 05/22/2019 10:33 AM   CHOLHDL 2.5 05/22/2019 10:33 AM   CHOLHDL 3.2 03/04/2018 12:48 AM   LDLCALC 35 05/22/2019 10:33 AM    Wt Readings from Last 3 Encounters:  06/05/19  180 lb 9.6 oz (81.9 kg)  06/01/19 182 lb 4 oz (82.7 kg)  05/25/19 179 lb 8 oz (81.4 kg)     Objective:    Vital Signs:  BP (!) 111/54    Pulse 77    Ht 5\' 11"  (1.803 m)    Wt 180 lb 9.6 oz (81.9 kg)    BMI 25.19 kg/m    VS reviewed. General - pleasant M in no acute distress Pulm - No labored breathing, no coughing during visit, no audible wheezing, speaking in full sentences Neuro - A+Ox3, no slurred speech, answers questions appropriately Psych - Pleasant affect     ASSESSMENT & PLAN:    1. CAD with recent anemia - I am concerned the risk of Plavix + Xarelto outweighs the potential benefit now that he is >1 year out from PCI and has had a recurrent anemic event. I will reach out to Dr. Angelena Form to review. I did encourage ongoing primary care follow-up to follow his anemia. They have been seeing PCP periodically since discharge who also plans ongoing telemedicine follow-up. I am concerned about his recent failure to thrive with numerous serious comorbidities in the setting of advanced age. I think goals of care discussions should be entertained from his primary care team - he may also benefit from some home health care during the pandemic to minimize his exposure to the community. [name] and [name] will review home health options with his PCP at their next telemed visit with family medicine team. 2. Chronic combined CHF - fluid status fluctuates per daughter. Lower extremity edema has been problematic at times but [name] sounds like she is doing a good job of periodically dosing his diuretic. Will change Lasix to torsemide 20mg  daily, with allowance to take 1 extra tablet daily as needed for swelling/weight gain. I anticipate this will become increasingly hard to manage in the future given his confounding CKD. Symptom control is the main priority at this time. His blood pressure prohibits other aggressive CHF med titration. They are also using compression hose as tolerated. 3. Persistent atrial  fib/flutter - rate controlled recently. Continue Lopressor (short acting instead of  long acting given h/o soft BP at times, ability to hold acutely if needed). Continue Xarelto 15mg  daily but with the understanding that if he has more bleeding or worsening falls, even this may need to be stopped. There are no easy answers here especially given h/o stroke. 4. CKD stage IV - baseline Cr appears 1.7-2.2 recently. Would follow conservatively at this point as he does not appear that he would be a candidate for HD given advanced age, tendency for softer BP and comorbidities.  COVID-19 Education: Discussed preference to limit this patient's exposure to community during time of increased cases.  Time:   Today, I have spent 22 minutes with the patient with telehealth technology discussing the above problems.     Medication Adjustments/Labs and Tests Ordered: Current medicines are reviewed at length with the patient today.  Concerns regarding medicines are outlined above.   Follow Up: in 1 month virtually with either me or Dr. Angelena Form if available.  Signed, Charlie Pitter, PA-C  06/05/2019 3:39 PM    [facility]

## 2019-06-05 ENCOUNTER — Telehealth (INDEPENDENT_AMBULATORY_CARE_PROVIDER_SITE_OTHER): Payer: Medicare Other | Admitting: Physician Assistant

## 2019-06-05 ENCOUNTER — Telehealth: Payer: Self-pay | Admitting: Physician Assistant

## 2019-06-05 ENCOUNTER — Telehealth: Payer: Self-pay | Admitting: *Deleted

## 2019-06-05 ENCOUNTER — Other Ambulatory Visit: Payer: Self-pay

## 2019-06-05 ENCOUNTER — Encounter: Payer: Self-pay | Admitting: Physician Assistant

## 2019-06-05 VITALS — BP 111/54 | HR 77 | Ht 71.0 in | Wt 180.6 lb

## 2019-06-05 DIAGNOSIS — I5042 Chronic combined systolic (congestive) and diastolic (congestive) heart failure: Secondary | ICD-10-CM

## 2019-06-05 DIAGNOSIS — D509 Iron deficiency anemia, unspecified: Secondary | ICD-10-CM

## 2019-06-05 DIAGNOSIS — I4821 Permanent atrial fibrillation: Secondary | ICD-10-CM

## 2019-06-05 DIAGNOSIS — I2510 Atherosclerotic heart disease of native coronary artery without angina pectoris: Secondary | ICD-10-CM

## 2019-06-05 DIAGNOSIS — N184 Chronic kidney disease, stage 4 (severe): Secondary | ICD-10-CM

## 2019-06-05 MED ORDER — TORSEMIDE 20 MG PO TABS: ORAL_TABLET | ORAL | 3 refills | Status: AC

## 2019-06-05 NOTE — Telephone Encounter (Signed)

## 2019-06-05 NOTE — Progress Notes (Signed)
Agree we should stop Plavix and not start ASA. He is a sweet old guy. Thanks for seeing him.

## 2019-06-05 NOTE — Telephone Encounter (Signed)
   Please let pt/daughters know Dr. Angelena Form agrees at this point we need to STOP Plavix/clopidogrel. I think they will be happy to hear this based on our discussion earlier. Given his bleeding episodes, we do not wish to add any baby aspirin back in its place. We will simply continue Xarelto as we discussed but continue to monitor for any blood in stool, black tarry stools, blood in urine, nosebleeds or any other unusual bleeding. Keep f/u PCP and [place] as scheduled.  Fallyn Munnerlyn PA-C

## 2019-06-05 NOTE — Telephone Encounter (Signed)
Pt's daughter, [name], has been made aware to stop Plavix and continue Xarelto. She is aware to continue to monitor for blood in stools, black tarry stools, blood in urine, or unusual bleeding. She was grateful for the call.

## 2019-06-05 NOTE — Patient Instructions (Signed)
Medication Instructions:  Your physician has recommended you make the following change in your medication:  1.  STOP the Lasix 2.  START Torsemide 20 mg taking 1 tablet daily and you may take 1 extra tablet daily as needed for swelling or weight gain of 3 lbs in 1 day or 5 lbs in a few days.  *If you need a refill on your cardiac medications before your next appointment, please call your pharmacy*  Lab Work: None ordered  If you have labs (blood work) drawn today and your tests are completely normal, you will receive your results only by: [name] MyChart Message (if you have MyChart) OR . A paper copy in the mail If you have any lab test that is abnormal or we need to change your treatment, we will call you to review the results.  Testing/Procedures: None ordered  Follow-Up: At [facility], you and your health needs are our priority.  As part of our continuing mission to provide you with exceptional heart care, we have created designated Provider Care Teams.  These Care Teams include your primary Cardiologist (physician) and Advanced Practice Providers (APPs -  Physician Assistants and Nurse Practitioners) who all work together to provide you with the care you need, when you need it.  Your next appointment:   07/04/2019 You will have another virtual appointment at 3:30.  Just like today, someone will contact you 15 mins before your appointment time.  Other Instructions I will talk to Dr. Angelena Form about the plan for your Plavix.

## 2019-06-06 ENCOUNTER — Telehealth (INDEPENDENT_AMBULATORY_CARE_PROVIDER_SITE_OTHER): Payer: Medicare Other | Admitting: Family Medicine

## 2019-06-06 ENCOUNTER — Other Ambulatory Visit: Payer: Self-pay

## 2019-06-06 DIAGNOSIS — D500 Iron deficiency anemia secondary to blood loss (chronic): Secondary | ICD-10-CM

## 2019-06-06 NOTE — Progress Notes (Signed)
[facility] Telemedicine Visit  Patient consented to have virtual visit. Method of visit: Telephone  Encounter participants: Patient: [name] - located at home doing physical therapy Provider: Bonnita Hollow - located at office Others (if applicable): Daughter  Chief Complaint: Follow-up for skin tears  HPI:  Patient with history of multiple skin tears from recent fall and excessive bleeding due to patient on anticoagulation with Xarelto and Plavix.  Patient to follow-up on wound healing and bleeding.  Patient was recently stopped on his Plavix by cardiology yesterday.  Patient is not had any more bleeding.  Patient also with history of anemia and hemoglobin in mid eights for recent GI bleed.  Studies consistent with iron deficiency anemia.  Patient has been on iron supplements which has been taking regularly without constipation or other symptoms.  Daughter reports that patient is overall feeling better, is able to physically engage in PT, has better pallor.  Reports no more bleeding from his wounds.   ROS: per HPI  Pertinent PMHx: Anticoagulation for CAD after PCI.  Exam:  Unable to assess as spoke to daughter  Assessment/Plan: Bleeding from skin tears, improved Bleeding has resolved.  Healing well without signs of infection per report.  Patient follow-up as needed.  Anemia with thrombocytopenia Anticoagulation recently reduced by cardiology by holding Plavix.  He is continued on Xarelto 15 mg daily.  Clinically seems improved.  Given that he just stopped the Plavix, would recommend that patient wait 1 month to recheck CBC with differential to evaluate hemoglobin and platelet count.  Patient wishes to have this done at home.  A home health order for nursing lab visit was placed.  Consider switching Xarelto to apixaban as this is lower GI bleed risk.  No problem-specific Assessment & Plan notes found for this encounter.    Time spent during  visit with patient: 15 minutes

## 2019-06-12 ENCOUNTER — Other Ambulatory Visit: Payer: Self-pay | Admitting: Family Medicine

## 2019-06-13 NOTE — Telephone Encounter (Signed)
Daughter called to check status. Christen Bame, CMA

## 2019-07-01 ENCOUNTER — Other Ambulatory Visit: Payer: Self-pay | Admitting: Family Medicine

## 2019-07-03 ENCOUNTER — Other Ambulatory Visit: Payer: Self-pay | Admitting: Cardiovascular Disease

## 2019-07-03 NOTE — Telephone Encounter (Signed)
OK to refill. [name]

## 2019-07-04 ENCOUNTER — Other Ambulatory Visit: Payer: Self-pay

## 2019-07-04 ENCOUNTER — Encounter: Payer: Self-pay | Admitting: Cardiology

## 2019-07-04 ENCOUNTER — Telehealth (INDEPENDENT_AMBULATORY_CARE_PROVIDER_SITE_OTHER): Payer: Medicare Other | Admitting: Cardiology

## 2019-07-04 VITALS — BP 105/49 | HR 78 | Ht 71.0 in | Wt 180.2 lb

## 2019-07-04 DIAGNOSIS — D62 Acute posthemorrhagic anemia: Secondary | ICD-10-CM | POA: Diagnosis not present

## 2019-07-04 DIAGNOSIS — N184 Chronic kidney disease, stage 4 (severe): Secondary | ICD-10-CM

## 2019-07-04 DIAGNOSIS — I2510 Atherosclerotic heart disease of native coronary artery without angina pectoris: Secondary | ICD-10-CM

## 2019-07-04 DIAGNOSIS — Z7901 Long term (current) use of anticoagulants: Secondary | ICD-10-CM

## 2019-07-04 DIAGNOSIS — I4819 Other persistent atrial fibrillation: Secondary | ICD-10-CM | POA: Diagnosis not present

## 2019-07-04 DIAGNOSIS — D649 Anemia, unspecified: Secondary | ICD-10-CM

## 2019-07-04 DIAGNOSIS — Z955 Presence of coronary angioplasty implant and graft: Secondary | ICD-10-CM

## 2019-07-04 DIAGNOSIS — I5042 Chronic combined systolic (congestive) and diastolic (congestive) heart failure: Secondary | ICD-10-CM | POA: Diagnosis not present

## 2019-07-04 DIAGNOSIS — N289 Disorder of kidney and ureter, unspecified: Secondary | ICD-10-CM

## 2019-07-04 MED ORDER — FAMOTIDINE 20 MG PO TABS: 20.0000 mg | ORAL_TABLET | Freq: Two times a day (BID) | ORAL | 1 refills | Status: AC

## 2019-07-04 NOTE — Patient Instructions (Signed)
Medication Instructions:  Your physician recommends that you continue on your current medications as directed. Please refer to the Current Medication list given to you today.  *If you need a refill on your cardiac medications before your next appointment, please call your pharmacy*  Lab Work: CBC & BMET on 07/09/2019  If you have labs (blood work) drawn today and your tests are completely normal, you will receive your results only by: [name] MyChart Message (if you have MyChart) OR . A paper copy in the mail If you have any lab test that is abnormal or we need to change your treatment, we will call you to review the results.  Testing/Procedures: None ordered   Follow-Up: You are scheduled to see Melina Copa PA-C on 08/21/2019 @ 8:15 AM  Other Instructions None

## 2019-07-04 NOTE — Progress Notes (Signed)
Virtual Visit via Telephone Note   This visit type was conducted due to national recommendations for restrictions regarding the COVID-19 Pandemic (e.g. social distancing) in an effort to limit this patient's exposure and mitigate transmission in our community.  Due to his co-morbid illnesses, this patient is at least at moderate risk for complications without adequate follow up.  This format is felt to be most appropriate for this patient at this time.  The patient did not have access to video technology/had technical difficulties with video requiring transitioning to audio format only (telephone).  All issues noted in this document were discussed and addressed.  No physical exam could be performed with this format.  Please refer to the patient's chart for his  consent to telehealth for [name].   Date:  07/04/2019   ID:  [name], DOB [birth year], MRN [id number]  Patient Location: Home Provider Location: Office  PCP:  Leeanne Rio, MD  Cardiologist:  Lauree Chandler, MD  Electrophysiologist:  None   Evaluation Performed:  Follow-Up Visit  Chief Complaint:  Edema  History of Present Illness:    [name] is a 85 y.o. male with combined CHF and last visit his lasix was stopped and torsemide added.  He has a hx of  CAD (anterior MI 2009 s/p LAD stenting with occluded RCA, NSTEMI 02/2018 s/p DES to OM), ischemic cardiomyopathy, chronic combined CHF, hyperlipidemia, persistent atrial fibrillation/flutter, DVT, CVA, GI bleeding/gastritis, chronic venous stasis dermatitis, CKD stage IV, chronic back pain, ITP, GERD The patient does not have symptoms concerning for COVID-19 infection (fever, chills, cough, or new shortness of breath).   He is known to have CAD with first cardiac cath in 2009 when he had 2 BMS to mid LAD. His RCA was occluded then. He had a DVT in 2015 and has been on Xarelto. He also has had paroxysmal AF/AFL, now persistent. He was  admitted to [facility] November 2017 with pneumonia, hemoptysis and CHF requiring diuresis. His Xarelto was stopped due to hemoptysis and anemia but he then had a stroke in January 2018 so Xarelto was restarted. EF was normal. He was admitted to [facility] in August 2019 with a NSTEMI. Cardiac cath 03/06/18 showed chronically occluded RCA, patent LAD stents and severe stenosis in the obtuse marginal branch treated with a drug eluting stent. Echo 03/06/18 showed LVEF 30-35%, moderate LVH, mildly increased PASP, mild LAE, mild MR. He was discharged on ASA, Plavix and Xarelto with plans for stopping ASA at one month. He was then admitted September 2019 with acute GI bleeding. EGD showed gastritis but no active bleeding. His ASA was stopped after one month. He has been maintained on Plavix and Xarelto. It appears his Xarelto dose had been listed as 2.5mg  BID after his GI bleeding event. He has also seen by Neurology due to dizziness in the past with unrevealing testing.  He was seen in our office in February 2020 with c/o lower extremity edema and left calf pain. LE duplex was negative. His edema resolved with Lasix. Phone notes from 04/2019 indicate his Xarelto 2.5mg  BID was changed to Eliquis 2.5mg  BID but then he had some bleeding into his eye so Eliquis was discontinued and he was changed to Xarelto 15mg  daily. He was then admitted May 15, 2019 for symptomatic anemia due to GIB with Hgb 6.7. GI was consulted and performed an EGD that did not indicate source for an active bleed. Patient was felt too high risk for colonoscopy. CT ABD/pelvis  indicated colonic diverticulosis without definite bowel wall thickening or mass.  Patient's iron studies showed concern for significant iron deficiency anemia. GI recommended patient have oral iron repletion. His Plavix and Xarelto were resumed. There was some concern he may have been taking Xarelto 15 mg BID on admission but daughter refutes this. Of note, he was seen back in the ED  05/23/19 with transient monocular vision change. MRI showed no acute stroke. Inflammatory markers were negative and he was advised to f/u with ophthalmology. His family says he has since discussed this with primary care and ophthalmology who do not feel it is of acute concern. Last labs 05/2019 showed Cr 2.16, Hgb 8.7, Plt 112, LDL 35, 2019 AST/ALT OK, Tbili 1.8. His CHF medication regimen has been limited due to hypotension and CKD (therefore not on ACEi, ARB, spiro or ARNI due to this).  On 06/05/19 He is accompanied by telephone with his daughters [name] and [name] today. In general he is holding his own. They are worried that he tends to bleed so easily with minimal cuts or bumps. He has not had any recurrent GIB. No CP or SOB. He is having occasional falls. [name] also has been periodically giving him an extra Lasix for increased leg swelling (typically L>R) and weight gain. He seems to be holding around 175-180lb recently at home. [name] states he was a little lower leaving the hospital recently but that was due to being acutely ill and not eating well. His appetite has picked back up.  Today he is doing very well. I talked with his daughter Reynaldo Minium has made a difference. In addition his gabapentin was stopped.  This has really made a difference with balance and  Energy.  He is walking with his walker more and has driven some.   His wt is stable and only edema is in evening his feet and toes may have some edema.  No SOB and no chest pain.  He denies any melena.  His appetite is good.  With his bleeding his plavix was stopped but his xarelto continues.   His wt is stable.     Past Medical History:  Diagnosis Date  . Abnormal CT of the chest 07/21/2016   January 2018 IMPRESSION: 1. Residual patchy ground-glass opacities throughout both lungs at the resolved areas of consolidation from the 05/16/2016 chest CT, consistent with a resolving infectious or inflammatory process. 2. Bandlike opacity in  the anterior right upper lobe is new since 05/16/2016, was probably present on 06/17/2016 chest radiograph, favor evolving postinfectious/postinflammatory   . Allergic rhinitis 01/11/2019  . Benign prostate hyperplasia 10/04/2011  . Bladder neck obstruction 10/04/2011  . Cerebrovascular accident (CVA) ([place]) 08/09/2016  . Chronic combined systolic (congestive) and diastolic (congestive) heart failure ([place])   . Chronic ITP (idiopathic thrombocytopenia) ([facility])   . Chronic kidney disease, stage 4 (severe) ([place]) 05/15/2019  . Coronary atherosclerosis of native coronary artery    a. Anterior STEMI 2009 s/p BMSx2 to mid & prox LAD and angioplasty to distal LAD. total RCA with collaterals, moderate Cx plaquing. a. NSTEMI 02/2018 - chronically occluded RCA, patent LAD stents and severe stenosis in the obtuse marginal branch treated with a drug eluting stent.  . Degenerative disk disease 12/20/2010   Sees Dr. Eddie Dibbles.  Has had MRI.   . Enlarged prostate with lower urinary tract symptoms (LUTS) 06/09/2011  . GERD (gastroesophageal reflux disease) 01/11/2019  . Gross hematuria   . Hemoptysis 05/16/2016  . History of GI bleed  03/17/2018   Admitted 03/17/18 with acute GI bleeding. EGD showed gastritis but no active bleeding. Aspirin stopped and Plavix and Xareltro continued.   [name] HTN (hypertension)   . Hyperlipidemia 04/23/2013  . Hypotension   . Iron deficiency anemia   . Ischemic cardiomyopathy   . Microscopic hematuria 09/28/2014  . Myocardial infarct ([place]) 03/21/2008  . NSTEMI (non-ST elevated myocardial infarction) ([place]) 05/16/2016  . NSVT (nonsustained ventricular tachycardia) ([place])    a. Per DC summary from time of STEMI 2009.  . Orthostatic hypotension 11/26/2016  . PAD (peripheral artery disease) ([place]) 08/13/2016  . Paroxysmal atrial flutter ([place])   . Persistent atrial fibrillation ([place])   . Phlebitis and thrombophlebitis of superficial vessels of lower extremities   . Pure hypercholesterolemia    a. Has not  tolerated statins in the past.  . Right leg DVT ([place])    a. Dx 01/2014.  [name] Sinus bradycardia    a. By prior event monitor.   . Thrombocytopenia ([place])   . Urinary retention 06/20/2011   Past Surgical History:  Procedure Laterality Date  . BIOPSY  05/17/2019   Procedure: BIOPSY;  Surgeon: Thornton Park, MD;  Location: [facility];  Service: Gastroenterology;;  . CARPAL TUNNEL RELEASE    . CORONARY STENT INTERVENTION N/A 03/06/2018   Procedure: CORONARY STENT INTERVENTION;  Surgeon: Lorretta Harp, MD;  Location: [place] CV LAB;  Service: Cardiovascular;  Laterality: N/A;  . CORONARY STENT PLACEMENT  2009  . ESOPHAGOGASTRODUODENOSCOPY (EGD) WITH PROPOFOL N/A 03/19/2018   Procedure: ESOPHAGOGASTRODUODENOSCOPY (EGD) WITH PROPOFOL;  Surgeon: Wonda Horner, MD;  Location: [facility] ENDOSCOPY;  Service: Endoscopy;  Laterality: N/A;  . ESOPHAGOGASTRODUODENOSCOPY (EGD) WITH PROPOFOL N/A 05/17/2019   Procedure: ESOPHAGOGASTRODUODENOSCOPY (EGD) WITH PROPOFOL;  Surgeon: Thornton Park, MD;  Location: [place] [facility];  Service: Gastroenterology;  Laterality: N/A;  . LEFT HEART CATH AND CORONARY ANGIOGRAPHY N/A 03/06/2018   Procedure: LEFT HEART CATH AND CORONARY ANGIOGRAPHY;  Surgeon: Lorretta Harp, MD;  Location: [place] CV LAB;  Service: Cardiovascular;  Laterality: N/A;  . REPLACEMENT TOTAL KNEE BILATERAL     Left 2014 (Dr. Eddie Dibbles); right 2012 (Dr. Redmond Pulling)  . trigger finger surgery       No outpatient medications have been marked as taking for the 07/04/19 encounter (Appointment) with Isaiah Serge, NP.     Allergies:   Prednisone and Rosuvastatin   Social History   Tobacco Use  . Smoking status: Never Smoker  . Smokeless tobacco: Never Used  Substance Use Topics  . Alcohol use: No  . Drug use: No     Family Hx: The patient's family history includes Cancer in his sister and sister.  ROS:   Please see the history of present illness.    General:no colds or fevers, no weight  changes Skin:no rashes or ulcers HEENT:no blurred vision, no congestion CV:see HPI PUL:see HPI GI:no diarrhea constipation or melena, no indigestion GU:no hematuria, no dysuria MS:no joint pain, no claudication Neuro:no syncope, no lightheadedness Endo:no diabetes, no thyroid disease  All other systems reviewed and are negative.   Prior CV studies:   The following studies were reviewed today:  Cardiac cath 03/07/19  Mid RCA to Dist RCA lesion is 100% stenosed.  Previously placed Prox LAD stent (unknown type) is widely patent.  Previously placed Prox LAD to Mid LAD stent (unknown type) is widely patent.  Ost 1st Mrg lesion is 99% stenosed.  A drug-eluting stent was successfully placed.  Post intervention, there is a 0%  residual stenosis.   Echo 03/04/18 Study Conclusions  - Left ventricle: The cavity size was normal. Wall thickness was   increased in a pattern of moderate LVH. Systolic function was   moderately to severely reduced. The estimated ejection fraction   was in the range of 30% to 35%. Diffuse hypokinesis. The study is   not technically sufficient to allow evaluation of LV diastolic   function. - Aortic valve: Valve area (VTI): 1.92 cm^2. Valve area (Vmax):   2.04 cm^2. - Mitral valve: There was mild regurgitation. - Left atrium: The atrium was mildly dilated. - Right ventricle: Poorly visualized. Grossly appears enlarged with   decreased function. - Right atrium: Poorly visualized. Grossly appears enlarged. - Atrial septum: No defect or patent foramen ovale was identified. - Pulmonary arteries: Systolic pressure was mildly increased. PA   peak pressure: 36 mm Hg (S). - LVEF is certaintly decreased from Jan 2018 study. Would recommend   limited study with echocontrast to more precisely evaluate   current current function. Labs/Other Tests and Data Reviewed:    EKG:  An ECG dated 05/24/19 was personally reviewed today and demonstrated:  a fib with rate  control and incomplete RBBB no acutechanges  Recent Labs: 05/15/2019: B Natriuretic Peptide 311.0 05/23/2019: BUN 29; Creatinine, Ser 2.16; Hemoglobin 8.7; Platelets 112; Potassium 4.6; Sodium 142   Recent Lipid Panel Lab Results  Component Value Date/Time   CHOL 79 (L) 05/22/2019 10:33 AM   TRIG 40 05/22/2019 10:33 AM   HDL 32 (L) 05/22/2019 10:33 AM   CHOLHDL 2.5 05/22/2019 10:33 AM   CHOLHDL 3.2 03/04/2018 12:48 AM   LDLCALC 35 05/22/2019 10:33 AM    Wt Readings from Last 3 Encounters:  06/05/19 180 lb 9.6 oz (81.9 kg)  06/01/19 182 lb 4 oz (82.7 kg)  05/25/19 179 lb 8 oz (81.4 kg)     Objective:    Vital Signs:  There were no vitals taken for this visit.   VITAL SIGNS:  reviewed  ASSESSMENT & PLAN:    1. Chronic combined CHF improved with change from lasix to torsemide.  Wt is stable and minimal edema. Will check BMP for renal AKI with medication adjustment 2. CAD with PCI - plavix stopped on last visit with anemia - he was greater than 1 year since PCI.  He continues on xarelto.   3. Anemia with recent GI bleed.  Stable but will need to recheck CBC due to his hx of symptomatic anemia.  4.  persistent a fib/flutter rate controlled on xarelto.  He monitors his stool for blood.  See #3. 5. CKD -4 recheck BMP on new diuretic.     COVID-19 Education: The signs and symptoms of COVID-19 were discussed with the patient and how to seek care for testing (follow up with PCP or arrange E-visit).  The importance of social distancing was discussed today.  Time:   Today, I have spent 8 minutes with the patient with telehealth technology discussing the above problems.     Medication Adjustments/Labs and Tests Ordered: Current medicines are reviewed at length with the patient today.  Concerns regarding medicines are outlined above.   Tests Ordered: No orders of the defined types were placed in this encounter.   Medication Changes: No orders of the defined types were placed in  this encounter.   Follow Up:  Virtual Visit  in 2 month(s)  Signed, Cecilie Kicks, NP  07/04/2019 9:35 AM    [place]

## 2019-07-09 ENCOUNTER — Other Ambulatory Visit: Payer: Self-pay

## 2019-07-09 ENCOUNTER — Other Ambulatory Visit: Payer: Medicare Other | Admitting: *Deleted

## 2019-07-09 DIAGNOSIS — D649 Anemia, unspecified: Secondary | ICD-10-CM

## 2019-07-09 DIAGNOSIS — N289 Disorder of kidney and ureter, unspecified: Secondary | ICD-10-CM

## 2019-07-10 LAB — BASIC METABOLIC PANEL
BUN/Creatinine Ratio: 10 (ref 10–24)
BUN: 18 mg/dL (ref 10–36)
CO2: 26 mmol/L (ref 20–29)
Calcium: 9.5 mg/dL (ref 8.6–10.2)
Chloride: 99 mmol/L (ref 96–106)
Creatinine, Ser: 1.72 mg/dL — ABNORMAL HIGH (ref 0.76–1.27)
GFR calc Af Amer: 39 mL/min/{1.73_m2} — ABNORMAL LOW (ref >59)
GFR calc non Af Amer: 34 mL/min/{1.73_m2} — ABNORMAL LOW (ref >59)
Glucose: 221 mg/dL — ABNORMAL HIGH (ref 65–99)
Potassium: 4.2 mmol/L (ref 3.5–5.2)
Sodium: 140 mmol/L (ref 134–144)

## 2019-07-10 LAB — CBC
Hematocrit: 42.4 % (ref 37.5–51.0)
Hemoglobin: 12.9 g/dL — ABNORMAL LOW (ref 13.0–17.7)
MCH: 26.9 pg (ref 26.6–33.0)
MCHC: 30.4 g/dL — ABNORMAL LOW (ref 31.5–35.7)
MCV: 89 fL (ref 79–97)
Platelets: 99 10*3/uL — CL (ref 150–450)
RBC: 4.79 x10E6/uL (ref 4.14–5.80)
RDW: 16.8 % — ABNORMAL HIGH (ref 11.6–15.4)
WBC: 6 10*3/uL (ref 3.4–10.8)

## 2019-07-18 DIAGNOSIS — M7021 Olecranon bursitis, right elbow: Secondary | ICD-10-CM | POA: Diagnosis not present

## 2019-07-20 ENCOUNTER — Other Ambulatory Visit: Payer: Self-pay | Admitting: Family Medicine

## 2019-07-23 ENCOUNTER — Ambulatory Visit (INDEPENDENT_AMBULATORY_CARE_PROVIDER_SITE_OTHER): Payer: Medicare PPO | Admitting: Family Medicine

## 2019-07-23 ENCOUNTER — Encounter: Payer: Self-pay | Admitting: Family Medicine

## 2019-07-23 ENCOUNTER — Other Ambulatory Visit: Payer: Self-pay

## 2019-07-23 VITALS — BP 130/70 | Ht 71.0 in | Wt 179.1 lb

## 2019-07-23 DIAGNOSIS — R739 Hyperglycemia, unspecified: Secondary | ICD-10-CM | POA: Diagnosis not present

## 2019-07-23 DIAGNOSIS — L570 Actinic keratosis: Secondary | ICD-10-CM

## 2019-07-23 DIAGNOSIS — R399 Unspecified symptoms and signs involving the genitourinary system: Secondary | ICD-10-CM

## 2019-07-23 DIAGNOSIS — R350 Frequency of micturition: Secondary | ICD-10-CM

## 2019-07-23 LAB — POCT GLYCOSYLATED HEMOGLOBIN (HGB A1C): HbA1c, POC (controlled diabetic range): 6.1 % (ref 0.0–7.0)

## 2019-07-23 LAB — POCT URINALYSIS DIP (MANUAL ENTRY)
Bilirubin, UA: NEGATIVE
Blood, UA: NEGATIVE
Glucose, UA: 100 mg/dL — AB
Ketones, POC UA: NEGATIVE mg/dL
Leukocytes, UA: NEGATIVE
Nitrite, UA: NEGATIVE
Protein Ur, POC: NEGATIVE mg/dL
Spec Grav, UA: 1.02 (ref 1.010–1.025)
Urobilinogen, UA: 0.2 E.U./dL
pH, UA: 6.5 (ref 5.0–8.0)

## 2019-07-23 MED ORDER — UREA 10 % EX CREA: TOPICAL_CREAM | CUTANEOUS | 0 refills | Status: AC | PRN

## 2019-07-23 NOTE — Progress Notes (Signed)
urine

## 2019-07-23 NOTE — Patient Instructions (Signed)
It was great meeting you today!  We checked a urine sample as well as your diabetes to see if these are causing your frequent urination.  I think because you have some sugar in your urine that the steroid injection for your elbow is likely causing this.  We will get a urine culture to double check this.  If it is the steroid injection causing this your symptoms should improve the next few days.  I will give you a call in the urine culture comes back and we can see how things are going.  If things are not improving we can try prostate medication.

## 2019-07-24 DIAGNOSIS — B351 Tinea unguium: Secondary | ICD-10-CM | POA: Diagnosis not present

## 2019-07-24 DIAGNOSIS — I739 Peripheral vascular disease, unspecified: Secondary | ICD-10-CM | POA: Diagnosis not present

## 2019-07-26 LAB — URINE CULTURE

## 2019-07-30 ENCOUNTER — Encounter: Payer: Self-pay | Admitting: Family Medicine

## 2019-07-30 DIAGNOSIS — R350 Frequency of micturition: Secondary | ICD-10-CM | POA: Insufficient documentation

## 2019-07-30 DIAGNOSIS — L570 Actinic keratosis: Secondary | ICD-10-CM | POA: Insufficient documentation

## 2019-07-30 NOTE — Assessment & Plan Note (Signed)
Leg findings are likely secondary to venous stasis and actinic keratosis.  Recommend elevating legs and apply some compressive stockings for the venous stasis.  Can apply urea cream for the actinic keratosis.

## 2019-07-30 NOTE — Progress Notes (Signed)
   HPI 86 year old male who presents with frequent urination and leg discoloration.  Patient's frequent urination has been ongoing for 10 to 14 days.  Patient had what sounds like an olecranon bursa injected with steroid on the day prior to the symptoms starting.  Patient also increased thirst at this time.  Patient has had no burning with urination.  He feels as though when he goes he only gets out a little bit of the urine and then the urge to urinate comes back shortly thereafter.  He states that his leg discoloration has been ongoing for a few months.  He feels it has gotten a little bit worse recently.  He has also noticed some crusting of this area as well.  His legs have not been painful, pruritic, or have had any other symptoms aside from the discoloration and crusting.  CC: Frequent urination, leg discoloration   ROS:   Review of Systems See HPI for ROS.   CC, SH/smoking status, and VS noted  Objective: BP 130/70   Ht 5\' 11"  (1.803 m)   Wt 179 lb 2 oz (81.3 kg)   BMI 24.98 kg/m  Gen: 86 year old Caucasian male, no acute distress, very pleasant CV: Regular rate rhythm, no M/R/G Resp: Lungs clear to auscultation bilaterally, no accessory muscle use Neuro: Alert and oriented, Speech clear, No gross deficits Leg: Mid tibial discoloration, fully circumferential around the left leg, hemispheric on the right leg.  Gold crusting, flaking, trace to 1+ edema in feet. UA: 100 glucose, otherwise negative         Assessment and plan:  Frequent urination Likely secondary to hyperglycemia from his steroid injection.  Urine culture with 25-50,000 CFU of E. coli and Klebsiella likely secondary to colonization.  Patient is to follow-up if symptoms do not resolve as the steroid injection wears off.  Actinic keratosis Leg findings are likely secondary to venous stasis and actinic keratosis.  Recommend elevating legs and apply some compressive stockings for the venous stasis.  Can  apply urea cream for the actinic keratosis.   Orders Placed This Encounter  Procedures  . Urine culture  . POCT urinalysis dipstick  . HgB A1c    Meds ordered this encounter  Medications  . urea (CARMOL) 10 % cream    Sig: Apply topically as needed.    Dispense:  71 g    Refill:  0     Guadalupe Dawn MD PGY-3 Family Medicine Resident  07/30/2019 6:07 PM

## 2019-07-30 NOTE — Assessment & Plan Note (Signed)
Likely secondary to hyperglycemia from his steroid injection.  Urine culture with 25-50,000 CFU of E. coli and Klebsiella likely secondary to colonization.  Patient is to follow-up if symptoms do not resolve as the steroid injection wears off.

## 2019-08-03 ENCOUNTER — Telehealth: Payer: Self-pay | Admitting: *Deleted

## 2019-08-03 NOTE — Telephone Encounter (Signed)
Pt takes Xarelto for afib with CHADs2VASc score of 7 (age x2, CHF, HTN, CAD, CVA in Jan 2018), also with history of DVT in 2015 and GI bleed in Sept 2019 and Nov 2020. SCr 1.72, CrCl is 14mL/min, pt on appropriately reduced dose of Xarelto 15mg  daily.  Typically hold DOACs for 3 days prior to spinal prodedures. Pt is complicated by elevated cardiac risk including stroke and DVT, however history of 2 GI bleeds and renal dysfunction. Will route to MD for input regarding appropriate length of anticoagulation hold.

## 2019-08-03 NOTE — Telephone Encounter (Signed)
   [facility] Pre-operative Risk Assessment    Request for surgical clearance:  1. What type of surgery is being performed? L5-S1 INTERLAMINAR ESI AT SCG   2. When is this surgery scheduled? 08/16/19   3. What type of clearance is required (medical clearance vs. Pharmacy clearance to hold med vs. Both)? BOTH  4. Are there any medications that need to be held prior to surgery and how long? [place]   5. Practice name and name of physician performing surgery? [facility]; DR. HAO WANG   6. What is your office phone number [phone]    7.   What is your office fax number [phone]  8.   Anesthesia type (None, local, MAC, general) ? NOT LISTED   Julaine Hua 08/03/2019, 11:24 AM  _________________________________________________________________   (provider comments below)

## 2019-08-06 NOTE — Telephone Encounter (Signed)
   Primary Cardiologist: Lauree Chandler, MD  Chart reviewed as part of pre-operative protocol coverage. Patient's daughter was contacted 08/06/2019 in reference to pre-operative risk assessment for pending surgery as outlined below.  [name] was last seen on 07/04/2019 by Cecilie Kicks, NP.  Since that day, [name] has done very well. His daughter states that he is well compensated on his current heart failure medications and has no anginal or heart failure symptoms.  Therefore, based on ACC/AHA guidelines, the patient would be at acceptable risk for the planned procedure without further cardiovascular testing.   Pt takes Xarelto for afib with CHADs2VASc score of 7 (age x2, CHF, HTN, CAD, CVA in Jan 2018), also with history of DVT in 2015 and GI bleed in Sept 2019 and Nov 2020. SCr 1.72, CrCl is 81mL/min, pt on appropriately reduced dose of Xarelto 15mg  daily.  Per Dr. Angelena Form, Xarelto can be held for 3 days prior to procedure. Resume when felt safe.   I will route this recommendation to the requesting party via Epic fax function and remove from pre-op pool.  Please call with questions.  Daune Perch, NP 08/06/2019, 10:32 AM

## 2019-08-06 NOTE — Telephone Encounter (Signed)
Megan, I would agree that we should hold his DOAC for three days prior to his procedure. Gerald Stabs

## 2019-08-16 DIAGNOSIS — M48061 Spinal stenosis, lumbar region without neurogenic claudication: Secondary | ICD-10-CM | POA: Diagnosis not present

## 2019-08-19 NOTE — Progress Notes (Signed)
Cardiology Office Note    Date:  08/21/2019   ID:  [name], DOB 07/26/1923, MRN [id number]  PCP:  Leeanne Rio, MD  Cardiologist:  Lauree Chandler, MD  Electrophysiologist:  None   Chief Complaint: 2 month f/u CAD and multiple medical problems  History of Present Illness:   [name] is a 86 y.o. male with history of CAD (anterior MI 2009 s/p LAD stenting with occluded RCA, NSTEMI 02/2018 s/p DES to OM), ischemic cardiomyopathy, chronic combined CHF, hyperlipidemia, persistent atrial fibrillation/flutter, DVT, CVA, GI bleeding/gastritis, chronic venous stasis dermatitis, CKD stage IV, chronic back pain, ITP, GERD who is seen virtually for 2 month cardiology follow-up.  He is known to have CAD with first cardiac cath in 2009 when he had 2 BMS to mid LAD. His RCA was occluded then. He had a DVT in 2015 and has been on Xarelto. He also has had paroxysmal AF/AFL, now persistent. He was admitted to [facility] 2017 with pneumonia, hemoptysis and CHF requiring diuresis. His Xarelto was stopped due to hemoptysis and anemia but he then had a stroke in January 2018 so Xarelto was restarted. EF was normal. He was admitted to [facility] in August 2019 with a NSTEMI. Cardiac cath 03/06/18 showed chronically occluded RCA, patent LAD stents and severe stenosis in the obtuse marginal branch treated with a drug eluting stent. Echo 03/06/18 showed LVEF 30-35%, moderate LVH, mildly increased PASP, mild LAE, mild MR. He was discharged on ASA, Plavix and Xarelto with plans for stopping ASA at one month. He was then admitted September 2019 with acute GI bleeding. EGD showed gastritis but no active bleeding. His ASA was stopped. He has been maintained on Plavix and Xarelto. It appears his Xarelto dose had been listed as 2.5mg  BID after his GI bleeding event.   He was seen in our office in February 2020 with c/o lower extremity edema and left calf pain. LE duplex was negative. He was  treated with Lasix. Phone notes from 04/2019 indicate his Xarelto 2.5mg  BID was changed to Eliquis 2.5mg  BID but then he had some bleeding into his eye so Eliquis was discontinued and he was changed to Xarelto 15mg  daily. He was then admitted November 2020 for symptomatic anemia due to GIB with Hgb 6.7. GI was consulted and performed an EGD that did not indicate source for an active bleed. Patient was felt too high risk for colonoscopy. CT ABD/pelvis indicated colonic diverticulosis without definite bowel wall thickening or mass.  Patient's iron studies showed concern for significant iron deficiency anemia. GI recommended patient have oral iron repletion. His Plavix and Xarelto were resumed. There was some concern he may have been taking Xarelto 15 mg BID on admission but daughter refuted this. Of note, he was seen back in the ED 05/23/19 with transient monocular vision change. MRI showed no acute stroke. Inflammatory markers were negative. His family subsequently discussed this with primary care and ophthalmology who did not feel it was of acute concern. His CHF medication regimen has been limited due to hypotension and CKD (therefore not on ACEi, ARB, spiro or ARNI due to this). I saw him via telemedicine in 05/2019 and changed Lasix to torsemide for lower extremity edema. We had discussed pursuing [place] discussions with primary care. Per discussion with Dr. Angelena Form, we stopped Plavix and continued Xarelto. Last labs personally reviewed 06/2019 include Cr 1.72, K 4.2, Hgb 12.9, plt 99, 05/2019 LDL 35 (PCP checked). Of note he also was seen in  the past by neurology due to dizziness in the past with unrevealing testing.  He presents back today in good spirits with daughter [name]. He has been doing great ever since the medication changes late last year. His weight has been stable and edema remains resolved. He has not had any new issues with CP/SOB. He had to have an epidural procedure last week for chronic back  issues.   Past Medical History:  Diagnosis Date  . Abnormal CT of the chest 07/21/2016   January 2018 IMPRESSION: 1. Residual patchy ground-glass opacities throughout both lungs at the resolved areas of consolidation from the 05/16/2016 chest CT, consistent with a resolving infectious or inflammatory process. 2. Bandlike opacity in the anterior right upper lobe is new since 05/16/2016, was probably present on 06/17/2016 chest radiograph, favor evolving postinfectious/postinflammatory   . Allergic rhinitis 01/11/2019  . Benign prostate hyperplasia 10/04/2011  . Bladder neck obstruction 10/04/2011  . Cerebrovascular accident (CVA) ([place]) 08/09/2016  . Chronic combined systolic (congestive) and diastolic (congestive) heart failure ([place])   . Chronic ITP (idiopathic thrombocytopenia) (H[facility])   . Chronic kidney disease, stage 4 (severe) ([place]) 05/15/2019  . Coronary atherosclerosis of native coronary artery    a. Anterior STEMI 2009 s/p BMSx2 to mid & prox LAD and angioplasty to distal LAD. total RCA with collaterals, moderate Cx plaquing. a. NSTEMI 02/2018 - chronically occluded RCA, patent LAD stents and severe stenosis in the obtuse marginal branch treated with a drug eluting stent.  . Degenerative disk disease 12/20/2010   Sees Dr. Eddie Dibbles.  Has had MRI.   . Enlarged prostate with lower urinary tract symptoms (LUTS) 06/09/2011  . GERD (gastroesophageal reflux disease) 01/11/2019  . Gross hematuria   . Hemoptysis 05/16/2016  . History of GI bleed 03/17/2018   Admitted 03/17/18 with acute GI bleeding. EGD showed gastritis but no active bleeding. Aspirin stopped and Plavix and Xareltro continued.   [name] HTN (hypertension)   . Hyperlipidemia 04/23/2013  . Hypotension   . Iron deficiency anemia   . Ischemic cardiomyopathy   . Microscopic hematuria 09/28/2014  . Myocardial infarct ([place]) 03/21/2008  . NSTEMI (non-ST elevated myocardial infarction) ([place]) 05/16/2016  . NSVT (nonsustained ventricular tachycardia) ([place])     a. Per DC summary from time of STEMI 2009.  . Orthostatic hypotension 11/26/2016  . PAD (peripheral artery disease) ([place]) 08/13/2016  . Paroxysmal atrial flutter ([place])   . Persistent atrial fibrillation ([place])   . Phlebitis and thrombophlebitis of superficial vessels of lower extremities   . Pure hypercholesterolemia    a. Has not tolerated statins in the past.  . Right leg DVT ([place])    a. Dx 01/2014.  [name] Sinus bradycardia    a. By prior event monitor.   . Thrombocytopenia ([place])   . Urinary retention 06/20/2011    Past Surgical History:  Procedure Laterality Date  . BIOPSY  05/17/2019   Procedure: BIOPSY;  Surgeon: Thornton Park, MD;  Location: [facility];  Service: Gastroenterology;;  . CARPAL TUNNEL RELEASE    . CORONARY STENT INTERVENTION N/A 03/06/2018   Procedure: CORONARY STENT INTERVENTION;  Surgeon: Lorretta Harp, MD;  Location: [facility];  Service: Cardiovascular;  Laterality: N/A;  . CORONARY STENT PLACEMENT  2009  . ESOPHAGOGASTRODUODENOSCOPY (EGD) WITH PROPOFOL N/A 03/19/2018   Procedure: ESOPHAGOGASTRODUODENOSCOPY (EGD) WITH PROPOFOL;  Surgeon: Wonda Horner, MD;  Location[facility] ENDOSCOPY;  Service: Endoscopy;  Laterality: N/A;  . ESOPHAGOGASTRODUODENOSCOPY (EGD) WITH PROPOFOL N/A 05/17/2019   Procedure: ESOPHAGOGASTRODUODENOSCOPY (EGD)  WITH PROPOFOL;  Surgeon: Thornton Park, MD;  Location: [facility];  Service: Gastroenterology;  Laterality: N/A;  . LEFT HEART CATH AND CORONARY ANGIOGRAPHY N/A 03/06/2018   Procedure: LEFT HEART CATH AND CORONARY ANGIOGRAPHY;  Surgeon: Lorretta Harp, MD;  Location: [facility] CV LAB;  Service: Cardiovascular;  Laterality: N/A;  . REPLACEMENT TOTAL KNEE BILATERAL     Left 2014 (Dr. Eddie Dibbles); right 2012 (Dr. Redmond Pulling)  . trigger finger surgery      Current Medications: Current Meds  Medication Sig  . cephALEXin (KEFLEX) 500 MG capsule TAKE 4 CAPSULES BY MOUTH 1 HR PRIOR TO DENTAL TREATMENT  . famotidine (PEPCID) 20 MG  tablet Take 1 tablet (20 mg total) by mouth 2 (two) times daily.  . ferrous sulfate 325 (65 FE) MG tablet Take 1 tablet (325 mg total) by mouth 2 (two) times daily with a meal.  . fluticasone (FLONASE) 50 MCG/ACT nasal spray Place 2 sprays into both nostrils daily as needed for allergies or rhinitis.  [name] KLOR-CON M20 20 MEQ tablet TAKE 2 TABLETS BY MOUTH DAILY  . loratadine (CLARITIN) 10 MG tablet Take 10 mg by mouth daily as needed for allergies.   . methocarbamol (ROBAXIN) 500 MG tablet Take 500 mg by mouth 2 (two) times daily as needed for muscle spasms.   . metoprolol tartrate (LOPRESSOR) 25 MG tablet Take 0.5 tablets (12.5 mg total) by mouth 2 (two) times daily.  . nitroGLYCERIN (NITROSTAT) 0.4 MG SL tablet Place 0.4 mg under the tongue every 5 (five) minutes as needed for chest pain.  . pantoprazole (PROTONIX) 40 MG tablet TAKE 1 TABLET BY MOUTH TWICE A DAY  . Polyethyl Glyc-Propyl Glyc PF (SYSTANE ULTRA PF) 0.4-0.3 % SOLN Place 1-2 drops into both eyes every morning.  . pravastatin (PRAVACHOL) 20 MG tablet Take 1 tablet (20 mg total) by mouth daily at 6 PM.  . pregabalin (LYRICA) 75 MG capsule Take 75 mg by mouth 2 (two) times daily.  . Rivaroxaban (XARELTO) 15 MG TABS tablet Take 1 tablet (15 mg total) by mouth daily with supper.  . senna (SENOKOT) 8.6 MG TABS tablet Take 1 tablet (8.6 mg total) by mouth daily as needed for mild constipation.  . torsemide (DEMADEX) 20 MG tablet Take 1 tablet by mouth daily, may take 1 extra tablet daily only as needed for swelling or weight gain of 3 lbs in one day or 5 lbs in a few days  . traMADol (ULTRAM) 50 MG tablet Take 50 mg by mouth daily as needed.  . urea (CARMOL) 10 % cream Apply topically as needed.      Allergies:   Eliquis [apixaban], Gabapentin, Prednisone, and Rosuvastatin   Social History   Socioeconomic History  . Marital status: Married    Spouse name: Not on file  . Number of children: Not on file  . Years of education: Not on  file  . Highest education level: Not on file  Occupational History  . Not on file  Tobacco Use  . Smoking status: Never Smoker  . Smokeless tobacco: Never Used  Substance and Sexual Activity  . Alcohol use: No  . Drug use: No  . Sexual activity: Not on file  Other Topics Concern  . Not on file  Social History Narrative   Lives in [place]. Wife has died. Lives next door to his daughter [name]. Also has a very supportive daughter, [name] who lives out of town. 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  Denies tobacco, alcohol, or drug use ever in lifetime.   Social Determinants of Health   Financial Resource Strain:   . Difficulty of Paying Living Expenses: Not on file  Food Insecurity:   . Worried About Charity fundraiser in the Last Year: Not on file  . Ran Out of Food in the Last Year: Not on file  Transportation Needs:   . Lack of Transportation (Medical): Not on file  . Lack of Transportation (Non-Medical): Not on file  Physical Activity:   . Days of Exercise per Week: Not on file  . Minutes of Exercise per Session: Not on file  Stress:   . Feeling of Stress : Not on file  Social Connections:   . Frequency of Communication with Friends and Family: Not on file  . Frequency of Social Gatherings with Friends and Family: Not on file  . Attends Religious Services: Not on file  . Active Member of Clubs or Organizations: Not on file  . Attends Archivist Meetings: Not on file  . Marital Status: Not on file     Family History:  The patient's family history includes Cancer in his sister and sister.  ROS:   Please see the history of present illness. Otherwise, review of systems is positive for transient lower abdominal discomfort last night, otherwise no urinary sx/fever/chills. Possible constipation. Symptoms resolved without intervention. All other systems are reviewed and otherwise negative.    EKGs/Labs/Other Studies Reviewed:    Studies  reviewed are outlined and summarized above. Reports included below if pertinent.  Cardiac Cath 02/2018 Conclusion  Mid RCA to Dist RCA lesion is 100% stenosed.  Previously placed Prox LAD stent (unknown type) is widely patent.  Previously placed Prox LAD to Mid LAD stent (unknown type) is widely patent.  Ost 1st Mrg lesion is 99% stenosed.  A drug-eluting stent was successfully placed.  Post intervention, there is a 0% residual stenosis.  2D Echo 02/2018 - Left ventricle: The cavity size was normal. Wall thickness was  increased in a pattern of moderate LVH. Systolic function was  moderately to severely reduced. The estimated ejection fraction  was in the range of 30% to 35%. Diffuse hypokinesis. The study is  not technically sufficient to allow evaluation of LV diastolic  function.  - Aortic valve: Valve area (VTI): 1.92 cm^2. Valve area (Vmax):  2.04 cm^2.  - Mitral valve: There was mild regurgitation.  - Left atrium: The atrium was mildly dilated.  - Right ventricle: Poorly visualized. Grossly appears enlarged with  decreased function.  - Right atrium: Poorly visualized. Grossly appears enlarged.  - Atrial septum: No defect or patent foramen ovale was identified.  - Pulmonary arteries: Systolic pressure was mildly increased. PA  peak pressure: 36 mm Hg (S).  - LVEF is certaintly decreased from Jan 2018 study. Would recommend  limited study with echocontrast to more precisely evaluate  current current function.     EKG:  EKG is not ordered today.  Recent Labs: 05/15/2019: B Natriuretic Peptide 311.0 07/09/2019: BUN 18; Creatinine, Ser 1.72; Hemoglobin 12.9; Platelets 99; Potassium 4.2; Sodium 140  Recent Lipid Panel    Component Value Date/Time   CHOL 79 (L) 05/22/2019 1033   TRIG 40 05/22/2019 1033   HDL 32 (L) 05/22/2019 1033   CHOLHDL 2.5 05/22/2019 1033   CHOLHDL 3.2 03/04/2018 0048   VLDL 4 03/04/2018 0048   LDLCALC 35 05/22/2019 1033     PHYSICAL EXAM:    VS:  BP  118/66   Pulse 78   Ht 5\' 11"  (1.803 m)   Wt 176 lb (79.8 kg)   SpO2 100%   BMI 24.55 kg/m   BMI: Body mass index is 24.55 kg/m.  GEN: Well nourished, well developed elderly WM, in no acute distress HEENT: normocephalic, atraumatic Neck: no JVD, carotid bruits, or masses Cardiac: RRR; no murmurs, rubs, or gallops, no edema  Respiratory:  clear to auscultation bilaterally, normal work of breathing GI: soft, nontender, nondistended, + BS MS: no deformity or atrophy Skin: warm and dry, thin skin noted with senile purpura, chronic venous stasis changes of LEE Neuro:  Alert and Oriented x 3, Strength and sensation are intact, follows commands Psych: euthymic mood, full affect  Wt Readings from Last 3 Encounters:  08/21/19 176 lb (79.8 kg)  07/23/19 179 lb 2 oz (81.3 kg)  07/04/19 180 lb 3.2 oz (81.7 kg)     ASSESSMENT & PLAN:   1. CAD - doing well without angina. He is not on ASA or Plavix any longer due to bleeding risk. Continue statin and metoprolol. Given his advanced age and history above, do not see there would be utility in aggressively uptitrating statin. Since he is doing well, we will not rock the boat. 2. History of anemia - stable by recheck 06/2019. Daughter reports no further significant bleeding. 3. Chronic combined CHF - appears euvolemic. Medication regimen has been limited above due to h/o labile hypotension and CKD. He is doing much better on torsemide. Last labs were stable, including potassium. Would not make any changes today. 4. Persistent atrial fib/flutter - he sounds to be quite regular on exam today although usually is in atrial flutter. Regardless, rate is well controlled on metoprolol. He will continue Xarelto as tolerated. He will need to be re-evaluated at each visit for continued candidacy for anticoagulation. He also follows regularly with PCP as well. 5. CKD stage IV - most recent Cr in 06/2019 was very stable compared to  prior. Avoid ACEI/ARB/Spiro due to this.  Disposition: F/u with Dr. Angelena Form in 6 months.  Medication Adjustments/Labs and Tests Ordered: Current medicines are reviewed at length with the patient today.  Concerns regarding medicines are outlined above. Medication changes, Labs and Tests ordered today are summarized above and listed in the Patient Instructions accessible in Encounters.   Signed, Charlie Pitter, PA-C  08/21/2019 8:30 AM    [address], [place], [place]  [address] Phone: [phone]; Fax: [phone]

## 2019-08-21 ENCOUNTER — Ambulatory Visit: Payer: Medicare PPO | Admitting: Physician Assistant

## 2019-08-21 ENCOUNTER — Encounter: Payer: Self-pay | Admitting: Physician Assistant

## 2019-08-21 VITALS — BP 118/66 | HR 78 | Ht 71.0 in | Wt 176.0 lb

## 2019-08-21 DIAGNOSIS — I4891 Unspecified atrial fibrillation: Secondary | ICD-10-CM

## 2019-08-21 DIAGNOSIS — I5042 Chronic combined systolic (congestive) and diastolic (congestive) heart failure: Secondary | ICD-10-CM | POA: Diagnosis not present

## 2019-08-21 DIAGNOSIS — Z862 Personal history of diseases of the blood and blood-forming organs and certain disorders involving the immune mechanism: Secondary | ICD-10-CM | POA: Diagnosis not present

## 2019-08-21 DIAGNOSIS — IMO0002 Reserved for concepts with insufficient information to code with codable children: Secondary | ICD-10-CM

## 2019-08-21 DIAGNOSIS — I4892 Unspecified atrial flutter: Secondary | ICD-10-CM

## 2019-08-21 DIAGNOSIS — N184 Chronic kidney disease, stage 4 (severe): Secondary | ICD-10-CM | POA: Diagnosis not present

## 2019-08-21 DIAGNOSIS — I2510 Atherosclerotic heart disease of native coronary artery without angina pectoris: Secondary | ICD-10-CM

## 2019-08-21 NOTE — Patient Instructions (Signed)
Medication Instructions:  Your physician recommends that you continue on your current medications as directed. Please refer to the Current Medication list given to you today.  *If you need a refill on your cardiac medications before your next appointment, please call your pharmacy*  Lab Work: None ordered  If you have labs (blood work) drawn today and your tests are completely normal, you will receive your results only by: [name] MyChart Message (if you have MyChart) OR . A paper copy in the mail If you have any lab test that is abnormal or we need to change your treatment, we will call you to review the results.  Testing/Procedures: None ordered  Follow-Up: At [facility], you and your health needs are our priority.  As part of our continuing mission to provide you with exceptional heart care, we have created designated Provider Care Teams.  These Care Teams include your primary Cardiologist (physician) and Advanced Practice Providers (APPs -  Physician Assistants and Nurse Practitioners) who all work together to provide you with the care you need, when you need it.  Your next appointment:   6 month(s)  The format for your next appointment:   In Person  Provider:   You may see Lauree Chandler, MD or one of the following Advanced Practice Providers on your designated Care Team:    Melina Copa, PA-C  Ermalinda Barrios, PA-C

## 2019-08-23 ENCOUNTER — Encounter: Payer: Self-pay | Admitting: Family Medicine

## 2019-08-23 ENCOUNTER — Ambulatory Visit: Payer: Medicare PPO | Admitting: Family Medicine

## 2019-08-23 ENCOUNTER — Other Ambulatory Visit: Payer: Self-pay

## 2019-08-23 VITALS — BP 118/66 | HR 71 | Wt 176.6 lb

## 2019-08-23 DIAGNOSIS — N400 Enlarged prostate without lower urinary tract symptoms: Secondary | ICD-10-CM

## 2019-08-23 DIAGNOSIS — R300 Dysuria: Secondary | ICD-10-CM

## 2019-08-23 DIAGNOSIS — R3911 Hesitancy of micturition: Secondary | ICD-10-CM

## 2019-08-23 LAB — POCT URINALYSIS DIP (MANUAL ENTRY)
Bilirubin, UA: NEGATIVE
Blood, UA: NEGATIVE
Glucose, UA: NEGATIVE mg/dL
Ketones, POC UA: NEGATIVE mg/dL
Leukocytes, UA: NEGATIVE
Nitrite, UA: NEGATIVE
Protein Ur, POC: NEGATIVE mg/dL
Spec Grav, UA: 1.015 (ref 1.010–1.025)
Urobilinogen, UA: 0.2 E.U./dL
pH, UA: 6.5 (ref 5.0–8.0)

## 2019-08-23 MED ORDER — TAMSULOSIN HCL 0.4 MG PO CAPS: 0.4000 mg | ORAL_CAPSULE | Freq: Every day | ORAL | 3 refills | Status: AC

## 2019-08-23 NOTE — Assessment & Plan Note (Signed)
Likely cause of hesitancy.  Start on flomax.  Be aware of potential for orthostasis.

## 2019-08-23 NOTE — Progress Notes (Signed)
   CHIEF COMPLAINT / HPI:  Urinary hesitancy x 1 week.  Patient was normal until epidural steroid injection one week ago.  Xarelto held in advance of the injection.  Did not get any pain relief with this one (he has gotten considerable relief with past injections.)  Since the injection, he has had difficulty voiding at night.  Voids well during the day.  Thinks that he fully empties bladder.  He has no fever or bowel changes.  No leg weakness, Walking normally  Family wanted him checked for a UTI.  Unclear past bladder/prostate history.  He has been seen by uro - Last visit perhaps 2 years ago, maybe more.  No cancer history, not on any prostate meds.  Does not have any orthostatic or lightheaded symptoms.        OBJECTIVE: BP 118/66   Pulse 71   Wt 176 lb 9.6 oz (80.1 kg)   SpO2 93%   BMI 24.63 kg/m   Abd benign, Gait is normal for him (he is 89, uses walker and son needed to help him stand.) Abd benign No CVA tenderness.  ASSESSMENT / PLAN:  No problem-specific Assessment & Plan notes found for this encounter.     Zenia Resides, MD [place]

## 2019-08-23 NOTE — Assessment & Plan Note (Signed)
Important diff dx given age 86.  Doubt UTI with normal UA but will still get culture. Doubt important neurologic complication from epidural injection, no other neuro signs. Likely BPH - will start on flomax.  Refer back to uro.

## 2019-08-23 NOTE — Patient Instructions (Signed)
I will call with the urine culture report.  I expect it to be negative.  Normally it takes two days - I probably will not call until Monday. I will send in a new medication to help the urine stream. Be careful about lightheadedness as you start the medicine. Please see the urologist at your convenience

## 2019-08-24 ENCOUNTER — Telehealth: Payer: Self-pay | Admitting: Cardiovascular Disease

## 2019-08-24 NOTE — Telephone Encounter (Signed)
New message:  Patient daughter calling about some medication. Please call back.

## 2019-08-24 NOTE — Telephone Encounter (Signed)
From heart standpoint no contraindication but would suggest they touch base with provider who did procedure for input/suggestions.

## 2019-08-24 NOTE — Telephone Encounter (Signed)
Pt's daughter, [name], called re: pt having Epidural in his back.  Pt is in pain and she was wondering if pt can take regular Tylenol and Tylenol with Codeine for some relief?

## 2019-08-24 NOTE — Telephone Encounter (Signed)
Returned call to daughter, [name].  She has been advised from a cardiac standpoint that there is no contraindication, but she should reach out to the provider who did the Epidural to get their input/suggestions.  She was very grateful for the call back.

## 2019-08-25 LAB — URINE CULTURE: Organism ID, Bacteria: NO GROWTH

## 2019-08-27 ENCOUNTER — Other Ambulatory Visit: Payer: Self-pay | Admitting: Family Medicine

## 2019-09-03 DIAGNOSIS — M545 Low back pain: Secondary | ICD-10-CM | POA: Diagnosis not present

## 2019-09-05 ENCOUNTER — Encounter (HOSPITAL_COMMUNITY): Payer: Self-pay

## 2019-09-05 ENCOUNTER — Telehealth (INDEPENDENT_AMBULATORY_CARE_PROVIDER_SITE_OTHER): Payer: Medicare PPO | Admitting: Family Medicine

## 2019-09-05 ENCOUNTER — Emergency Department (HOSPITAL_COMMUNITY): Payer: Medicare PPO

## 2019-09-05 ENCOUNTER — Other Ambulatory Visit: Payer: Self-pay

## 2019-09-05 ENCOUNTER — Inpatient Hospital Stay (HOSPITAL_COMMUNITY)
Admission: EM | Admit: 2019-09-05 | Discharge: 2019-09-08 | DRG: 177 | Disposition: A | Discharge status: Home / Self Care | Payer: Medicare PPO | Attending: Family Medicine | Admitting: Family Medicine

## 2019-09-05 DIAGNOSIS — N179 Acute kidney failure, unspecified: Secondary | ICD-10-CM | POA: Diagnosis present

## 2019-09-05 DIAGNOSIS — R05 Cough: Secondary | ICD-10-CM | POA: Diagnosis not present

## 2019-09-05 DIAGNOSIS — I130 Hypertensive heart and chronic kidney disease with heart failure and stage 1 through stage 4 chronic kidney disease, or unspecified chronic kidney disease: Secondary | ICD-10-CM | POA: Diagnosis present

## 2019-09-05 DIAGNOSIS — K219 Gastro-esophageal reflux disease without esophagitis: Secondary | ICD-10-CM | POA: Diagnosis present

## 2019-09-05 DIAGNOSIS — J1282 Pneumonia due to coronavirus disease 2019: Secondary | ICD-10-CM | POA: Diagnosis present

## 2019-09-05 DIAGNOSIS — N184 Chronic kidney disease, stage 4 (severe): Secondary | ICD-10-CM | POA: Diagnosis present

## 2019-09-05 DIAGNOSIS — I739 Peripheral vascular disease, unspecified: Secondary | ICD-10-CM | POA: Diagnosis present

## 2019-09-05 DIAGNOSIS — Z79899 Other long term (current) drug therapy: Secondary | ICD-10-CM

## 2019-09-05 DIAGNOSIS — Z888 Allergy status to other drugs, medicaments and biological substances status: Secondary | ICD-10-CM

## 2019-09-05 DIAGNOSIS — G8929 Other chronic pain: Secondary | ICD-10-CM | POA: Diagnosis present

## 2019-09-05 DIAGNOSIS — I4891 Unspecified atrial fibrillation: Secondary | ICD-10-CM | POA: Diagnosis not present

## 2019-09-05 DIAGNOSIS — E785 Hyperlipidemia, unspecified: Secondary | ICD-10-CM | POA: Diagnosis present

## 2019-09-05 DIAGNOSIS — Z7901 Long term (current) use of anticoagulants: Secondary | ICD-10-CM

## 2019-09-05 DIAGNOSIS — Z20822 Contact with and (suspected) exposure to covid-19: Secondary | ICD-10-CM

## 2019-09-05 DIAGNOSIS — R32 Unspecified urinary incontinence: Secondary | ICD-10-CM | POA: Diagnosis present

## 2019-09-05 DIAGNOSIS — M549 Dorsalgia, unspecified: Secondary | ICD-10-CM | POA: Diagnosis present

## 2019-09-05 DIAGNOSIS — U071 COVID-19: Principal | ICD-10-CM | POA: Diagnosis present

## 2019-09-05 DIAGNOSIS — D693 Immune thrombocytopenic purpura: Secondary | ICD-10-CM | POA: Diagnosis present

## 2019-09-05 DIAGNOSIS — I10 Essential (primary) hypertension: Secondary | ICD-10-CM | POA: Diagnosis present

## 2019-09-05 DIAGNOSIS — J988 Other specified respiratory disorders: Secondary | ICD-10-CM | POA: Diagnosis not present

## 2019-09-05 DIAGNOSIS — I252 Old myocardial infarction: Secondary | ICD-10-CM

## 2019-09-05 DIAGNOSIS — Z955 Presence of coronary angioplasty implant and graft: Secondary | ICD-10-CM | POA: Diagnosis not present

## 2019-09-05 DIAGNOSIS — R3911 Hesitancy of micturition: Secondary | ICD-10-CM | POA: Diagnosis present

## 2019-09-05 DIAGNOSIS — R7401 Elevation of levels of liver transaminase levels: Secondary | ICD-10-CM | POA: Diagnosis present

## 2019-09-05 DIAGNOSIS — F0390 Unspecified dementia without behavioral disturbance: Secondary | ICD-10-CM | POA: Diagnosis present

## 2019-09-05 DIAGNOSIS — I5042 Chronic combined systolic (congestive) and diastolic (congestive) heart failure: Secondary | ICD-10-CM | POA: Diagnosis present

## 2019-09-05 DIAGNOSIS — I2510 Atherosclerotic heart disease of native coronary artery without angina pectoris: Secondary | ICD-10-CM | POA: Diagnosis present

## 2019-09-05 DIAGNOSIS — Z8672 Personal history of thrombophlebitis: Secondary | ICD-10-CM

## 2019-09-05 DIAGNOSIS — Z96653 Presence of artificial knee joint, bilateral: Secondary | ICD-10-CM | POA: Diagnosis present

## 2019-09-05 DIAGNOSIS — I4819 Other persistent atrial fibrillation: Secondary | ICD-10-CM | POA: Diagnosis present

## 2019-09-05 DIAGNOSIS — Z8673 Personal history of transient ischemic attack (TIA), and cerebral infarction without residual deficits: Secondary | ICD-10-CM | POA: Diagnosis not present

## 2019-09-05 DIAGNOSIS — E860 Dehydration: Secondary | ICD-10-CM | POA: Diagnosis present

## 2019-09-05 DIAGNOSIS — R059 Cough, unspecified: Secondary | ICD-10-CM

## 2019-09-05 DIAGNOSIS — R410 Disorientation, unspecified: Secondary | ICD-10-CM

## 2019-09-05 DIAGNOSIS — Z86718 Personal history of other venous thrombosis and embolism: Secondary | ICD-10-CM

## 2019-09-05 HISTORY — DX: COVID-19: U07.1

## 2019-09-05 LAB — CBC
HCT: 46.2 % (ref 39.0–52.0)
Hemoglobin: 14.4 g/dL (ref 13.0–17.0)
MCH: 27.2 pg (ref 26.0–34.0)
MCHC: 31.2 g/dL (ref 30.0–36.0)
MCV: 87.2 fL (ref 80.0–100.0)
Platelets: 75 10*3/uL — ABNORMAL LOW (ref 150–400)
RBC: 5.3 MIL/uL (ref 4.22–5.81)
RDW: 14.6 % (ref 11.5–15.5)
WBC: 3.5 10*3/uL — ABNORMAL LOW (ref 4.0–10.5)
nRBC: 0 % (ref 0.0–0.2)

## 2019-09-05 LAB — URINALYSIS, ROUTINE W REFLEX MICROSCOPIC
Bacteria, UA: NONE SEEN
Bilirubin Urine: NEGATIVE
Glucose, UA: NEGATIVE mg/dL
Ketones, ur: NEGATIVE mg/dL
Leukocytes,Ua: NEGATIVE
Nitrite: NEGATIVE
Protein, ur: NEGATIVE mg/dL
Specific Gravity, Urine: 1.016 (ref 1.005–1.030)
pH: 5 (ref 5.0–8.0)

## 2019-09-05 LAB — COMPREHENSIVE METABOLIC PANEL
ALT: 25 U/L (ref 0–44)
AST: 58 U/L — ABNORMAL HIGH (ref 15–41)
Albumin: 2.9 g/dL — ABNORMAL LOW (ref 3.5–5.0)
Alkaline Phosphatase: 81 U/L (ref 38–126)
Anion gap: 13 (ref 5–15)
BUN: 49 mg/dL — ABNORMAL HIGH (ref 8–23)
CO2: 27 mmol/L (ref 22–32)
Calcium: 8.6 mg/dL — ABNORMAL LOW (ref 8.9–10.3)
Chloride: 99 mmol/L (ref 98–111)
Creatinine, Ser: 2.81 mg/dL — ABNORMAL HIGH (ref 0.61–1.24)
GFR calc Af Amer: 21 mL/min — ABNORMAL LOW (ref >60)
GFR calc non Af Amer: 19 mL/min — ABNORMAL LOW (ref >60)
Glucose, Bld: 141 mg/dL — ABNORMAL HIGH (ref 70–99)
Potassium: 4.2 mmol/L (ref 3.5–5.1)
Sodium: 139 mmol/L (ref 135–145)
Total Bilirubin: 1.1 mg/dL (ref 0.3–1.2)
Total Protein: 6.2 g/dL — ABNORMAL LOW (ref 6.5–8.1)

## 2019-09-05 LAB — CBG MONITORING, ED: Glucose-Capillary: 156 mg/dL — ABNORMAL HIGH (ref 70–99)

## 2019-09-05 LAB — FERRITIN: Ferritin: 351 ng/mL — ABNORMAL HIGH (ref 24–336)

## 2019-09-05 LAB — D-DIMER, QUANTITATIVE: D-Dimer, Quant: 1.07 ug/mL-FEU — ABNORMAL HIGH (ref 0.00–0.50)

## 2019-09-05 LAB — C-REACTIVE PROTEIN: CRP: 9.9 mg/dL — ABNORMAL HIGH (ref <1.0)

## 2019-09-05 LAB — PROCALCITONIN: Procalcitonin: <0.1 ng/mL

## 2019-09-05 LAB — FIBRINOGEN: Fibrinogen: 651 mg/dL — ABNORMAL HIGH (ref 210–475)

## 2019-09-05 LAB — LACTATE DEHYDROGENASE: LDH: 322 U/L — ABNORMAL HIGH (ref 98–192)

## 2019-09-05 LAB — POC SARS CORONAVIRUS 2 AG -  ED: SARS Coronavirus 2 Ag: POSITIVE — AB

## 2019-09-05 LAB — LACTIC ACID, PLASMA: Lactic Acid, Venous: 1.6 mmol/L (ref 0.5–1.9)

## 2019-09-05 LAB — TRIGLYCERIDES: Triglycerides: 91 mg/dL (ref <150)

## 2019-09-05 MED ORDER — METOPROLOL TARTRATE 12.5 MG HALF TABLET
12.5000 mg | ORAL_TABLET | Freq: Two times a day (BID) | ORAL | Status: DC
  Administered 2019-09-05 – 2019-09-07 (×4): 12.5 mg via ORAL
  Filled 2019-09-05 (×6): qty 1

## 2019-09-05 MED ORDER — ACETAMINOPHEN 325 MG PO TABS
650.0000 mg | ORAL_TABLET | Freq: Four times a day (QID) | ORAL | Status: DC | PRN
  Administered 2019-09-07: 650 mg via ORAL
  Filled 2019-09-05: qty 2

## 2019-09-05 MED ORDER — FLUTICASONE PROPIONATE 50 MCG/ACT NA SUSP
1.0000 | Freq: Every day | NASAL | Status: DC
  Administered 2019-09-06 – 2019-09-07 (×2): 1 via NASAL
  Filled 2019-09-05: qty 16

## 2019-09-05 MED ORDER — RIVAROXABAN 15 MG PO TABS
15.0000 mg | ORAL_TABLET | Freq: Every day | ORAL | Status: DC
  Administered 2019-09-05 – 2019-09-07 (×3): 15 mg via ORAL
  Filled 2019-09-05 (×4): qty 1

## 2019-09-05 MED ORDER — PANTOPRAZOLE SODIUM 40 MG PO TBEC
40.0000 mg | DELAYED_RELEASE_TABLET | Freq: Two times a day (BID) | ORAL | Status: DC
  Administered 2019-09-05 – 2019-09-07 (×4): 40 mg via ORAL
  Filled 2019-09-05 (×6): qty 1

## 2019-09-05 MED ORDER — POLYETHYLENE GLYCOL 3350 17 G PO PACK: 17.0000 g | PACK | Freq: Every day | ORAL | Status: DC | PRN

## 2019-09-05 MED ORDER — SODIUM CHLORIDE 0.9 % IV SOLN
200.0000 mg | Freq: Once | INTRAVENOUS | Status: AC
  Administered 2019-09-05: 200 mg via INTRAVENOUS
  Filled 2019-09-05: qty 200

## 2019-09-05 MED ORDER — DOCUSATE SODIUM 100 MG PO CAPS
100.0000 mg | ORAL_CAPSULE | Freq: Two times a day (BID) | ORAL | Status: DC
  Administered 2019-09-06 – 2019-09-07 (×2): 100 mg via ORAL
  Filled 2019-09-05 (×5): qty 1

## 2019-09-05 MED ORDER — PREGABALIN 75 MG PO CAPS
75.0000 mg | ORAL_CAPSULE | Freq: Two times a day (BID) | ORAL | Status: DC
  Administered 2019-09-05 – 2019-09-07 (×4): 75 mg via ORAL
  Filled 2019-09-05 (×6): qty 1

## 2019-09-05 MED ORDER — SODIUM CHLORIDE 0.9 % IV SOLN
100.0000 mg | Freq: Every day | INTRAVENOUS | Status: DC
  Administered 2019-09-06 – 2019-09-08 (×3): 100 mg via INTRAVENOUS
  Filled 2019-09-05 (×4): qty 20

## 2019-09-05 MED ORDER — SODIUM CHLORIDE 0.9 % IV SOLN: INTRAVENOUS | Status: DC

## 2019-09-05 MED ORDER — TAMSULOSIN HCL 0.4 MG PO CAPS
0.4000 mg | ORAL_CAPSULE | Freq: Every day | ORAL | Status: DC
  Administered 2019-09-05 – 2019-09-07 (×3): 0.4 mg via ORAL
  Filled 2019-09-05 (×4): qty 1

## 2019-09-05 MED ORDER — FAMOTIDINE 20 MG PO TABS
20.0000 mg | ORAL_TABLET | Freq: Two times a day (BID) | ORAL | Status: DC
  Administered 2019-09-05 – 2019-09-06 (×2): 20 mg via ORAL
  Filled 2019-09-05 (×2): qty 1

## 2019-09-05 MED ORDER — POLYVINYL ALCOHOL 1.4 % OP SOLN
1.0000 [drp] | Freq: Every morning | OPHTHALMIC | Status: DC
  Administered 2019-09-06: 09:00:00 1 [drp] via OPHTHALMIC
  Administered 2019-09-07 – 2019-09-08 (×2): 2 [drp] via OPHTHALMIC
  Filled 2019-09-05: qty 15

## 2019-09-05 MED ORDER — PRAVASTATIN SODIUM 10 MG PO TABS
20.0000 mg | ORAL_TABLET | Freq: Every day | ORAL | Status: DC
  Administered 2019-09-05 – 2019-09-06 (×2): 20 mg via ORAL
  Filled 2019-09-05 (×3): qty 2

## 2019-09-05 MED ORDER — FERROUS SULFATE 325 (65 FE) MG PO TABS
325.0000 mg | ORAL_TABLET | Freq: Two times a day (BID) | ORAL | Status: DC
  Administered 2019-09-05 – 2019-09-07 (×4): 325 mg via ORAL
  Filled 2019-09-05 (×5): qty 1

## 2019-09-05 MED ORDER — SENNA 8.6 MG PO TABS: 1.0000 | ORAL_TABLET | Freq: Every day | ORAL | Status: DC | PRN

## 2019-09-05 MED ORDER — METHOCARBAMOL 500 MG PO TABS
500.0000 mg | ORAL_TABLET | Freq: Every day | ORAL | Status: DC
  Filled 2019-09-05 (×2): qty 1

## 2019-09-05 NOTE — ED Provider Notes (Signed)
Medical screening examination/treatment/procedure(s) were conducted as a shared visit with non-physician practitioner(s) and myself.  I personally evaluated the patient during the encounter.    Patient started developing worsening cough over the past 2 days.  Last night there is some productive brown sputum.  Patient has been exposed to several family members who have COVID-19.  Last exposure was estimated to be 13 days ago.  Patient is more confused than baseline.  Patient is alert.  He is not in acute distress.  Lungs have some fine crackle diffusely.  Abdomen is soft and nondistended.  Changes of chronic venous stasis of lower extremities.  I agree with plan of management.   [name], MD 09/05/19 [phone]

## 2019-09-05 NOTE — Assessment & Plan Note (Signed)
Patient with no history of COPD or asthma.  Daughter states he had multiple family members who tested positive for Covid on February 12 and February 14 the patient was around them on February 11.  Overnight he has developed productive cough.  The daughter states that he is more confused and somnolent.  Given patient's age and high risk for Covid infection and his altered mental status recommended that he be taken to the emergency room to be evaluated.  Family with no O2 monitor in the home.  Family voiced understanding and plan to take him to the emergency room as soon as they are able.

## 2019-09-05 NOTE — ED Provider Notes (Signed)
[facility] EMERGENCY DEPARTMENT Provider Note   CSN: [id number] Arrival date & time: 09/05/19  1056     History Chief Complaint  Patient presents with  . Cough    [name] is a 86 y.o. male.  The history is provided by the patient, a relative and medical records. No language interpreter was used.  Cough    86 year old male with history of hypertension, CAD, CVA, chronic kidney disease, PAD, atrial fibrillation presents ED for evaluation of a cough.  When discussed with patient patient does not know why he is in the hospital.  He denies having any active symptoms.  Denies having headache, coughing, neck pain, chest pain, trouble breathing, abdominal pain, back pain, dysuria, focal numbness or weakness.  Daughter who is now at bedside report that patient was coughing persistently for last night cough is productive with brown sputum and he appears to be in discomfort.  She worries that he may have COVID-19 as multiple family members has had COVID-19 several weeks ago however patient has not been around any of the family members for at least 13 days.  She also report patient appears to be more confused than usual.  She also worried for potential urinary tract infection and would like to have his urine checked.  She did not notice any complains of fever or chills.  She report patient had ongoing back pain since an epidural approximately 20 days ago.  States that he was seen by his specialist 3 days ago for a checkup of his back and was told that everything seems to be okay.  Currently patient does not complain of any pain.  Past Medical History:  Diagnosis Date  . Abnormal CT of the chest 07/21/2016   January 2018 IMPRESSION: 1. Residual patchy ground-glass opacities throughout both lungs at the resolved areas of consolidation from the 05/16/2016 chest CT, consistent with a resolving infectious or inflammatory process. 2. Bandlike opacity in the anterior right upper  lobe is new since 05/16/2016, was probably present on 06/17/2016 chest radiograph, favor evolving postinfectious/postinflammatory   . Allergic rhinitis 01/11/2019  . Benign prostate hyperplasia 10/04/2011  . Bladder neck obstruction 10/04/2011  . Cerebrovascular accident (CVA) ([place]) 08/09/2016  . Chronic combined systolic (congestive) and diastolic (congestive) heart failure ([place])   . Chronic ITP (idiopathic thrombocytopenia) (HCC)   . Chronic kidney disease, stage 4 (severe) ([place]) 05/15/2019  . Coronary atherosclerosis of native coronary artery    a. Anterior STEMI 2009 s/p BMSx2 to mid & prox LAD and angioplasty to distal LAD. total RCA with collaterals, moderate Cx plaquing. a. NSTEMI 02/2018 - chronically occluded RCA, patent LAD stents and severe stenosis in the obtuse marginal branch treated with a drug eluting stent.  . Degenerative disk disease 12/20/2010   Sees Dr. Eddie Dibbles.  Has had MRI.   . Enlarged prostate with lower urinary tract symptoms (LUTS) 06/09/2011  . GERD (gastroesophageal reflux disease) 01/11/2019  . Gross hematuria   . Hemoptysis 05/16/2016  . History of GI bleed 03/17/2018   Admitted 03/17/18 with acute GI bleeding. EGD showed gastritis but no active bleeding. Aspirin stopped and Plavix and Xareltro continued.   [name] HTN (hypertension)   . Hyperlipidemia 04/23/2013  . Hypotension   . Iron deficiency anemia   . Ischemic cardiomyopathy   . Microscopic hematuria 09/28/2014  . Myocardial infarct ([place]) 03/21/2008  . NSTEMI (non-ST elevated myocardial infarction) ([place]) 05/16/2016  . NSVT (nonsustained ventricular tachycardia) ([name])    a. Per DC summary  from time of STEMI 2009.  . Orthostatic hypotension 11/26/2016  . PAD (peripheral artery disease) ([place]) 08/13/2016  . Paroxysmal atrial flutter ([place])   . Persistent atrial fibrillation ([place])   . Phlebitis and thrombophlebitis of superficial vessels of lower extremities   . Pure hypercholesterolemia    a. Has not tolerated statins in the  past.  . Right leg DVT ([place])    a. Dx 01/2014.  [name] Sinus bradycardia    a. By prior event monitor.   . Thrombocytopenia ([place])   . Urinary retention 06/20/2011    Patient Active Problem List   Diagnosis Date Noted  . Urinary hesitancy 08/23/2019  . Frequent urination 07/30/2019  . Actinic keratosis 07/30/2019  . Skin tear of forearm without complication 50/27/7412  . Abrasion of left index finger 05/25/2019  . Vision changes 05/25/2019  . Gastritis and gastroduodenitis   . Symptomatic anemia   . Acute anemia 05/15/2019  . Chronic kidney disease, stage 4 (severe) ([place]) 05/15/2019  . GI bleed 05/15/2019  . Near syncope   . GERD (gastroesophageal reflux disease) 01/11/2019  . Allergic rhinitis 01/11/2019  . History of GI bleed 03/17/2018  . Persistent atrial fibrillation ([place])   . Cough 04/25/2017  . Chronic ITP (idiopathic thrombocytopenia) (HCC)   . Coronary atherosclerosis of native coronary artery   . HTN (hypertension)   . Hyperlipidemia 04/23/2013  . Benign prostate hyperplasia 10/04/2011  . Anemia 09/16/2011  . Congestive heart failure with left ventricular diastolic dysfunction ([place]) 05/25/2011    Past Surgical History:  Procedure Laterality Date  . BIOPSY  05/17/2019   Procedure: BIOPSY;  Surgeon: Thornton Park, MD;  Location: [facility];  Service: Gastroenterology;;  . CARPAL TUNNEL RELEASE    . CORONARY STENT INTERVENTION N/A 03/06/2018   Procedure: CORONARY STENT INTERVENTION;  Surgeon: Lorretta Harp, MD;  Location: [employer] CV LAB;  Service: Cardiovascular;  Laterality: N/A;  . CORONARY STENT PLACEMENT  2009  . ESOPHAGOGASTRODUODENOSCOPY (EGD) WITH PROPOFOL N/A 03/19/2018   Procedure: ESOPHAGOGASTRODUODENOSCOPY (EGD) WITH PROPOFOL;  Surgeon: Wonda Horner, MD;  Location[facility] ENDOSCOPY;  Service: Endoscopy;  Laterality: N/A;  . ESOPHAGOGASTRODUODENOSCOPY (EGD) WITH PROPOFOL N/A 05/17/2019   Procedure: ESOPHAGOGASTRODUODENOSCOPY (EGD) WITH PROPOFOL;   Surgeon: Thornton Park, MD;  Location: [facility];  Service: Gastroenterology;  Laterality: N/A;  . LEFT HEART CATH AND CORONARY ANGIOGRAPHY N/A 03/06/2018   Procedure: LEFT HEART CATH AND CORONARY ANGIOGRAPHY;  Surgeon: Lorretta Harp, MD;  Location: [facility] CV LAB;  Service: Cardiovascular;  Laterality: N/A;  . REPLACEMENT TOTAL KNEE BILATERAL     Left 2014 (Dr. Eddie Dibbles); right 2012 (Dr. Redmond Pulling)  . trigger finger surgery         Family History  Problem Relation Age of Onset  . Cancer Sister   . Cancer Sister     Social History   Tobacco Use  . Smoking status: Never Smoker  . Smokeless tobacco: Never Used  Substance Use Topics  . Alcohol use: No  . Drug use: No    Home Medications Prior to Admission medications   Medication Sig Start Date End Date Taking? Authorizing Provider  cephALEXin (KEFLEX) 500 MG capsule TAKE 4 CAPSULES BY MOUTH 1 HR PRIOR TO DENTAL TREATMENT 06/13/19   [provider]  famotidine (PEPCID) 20 MG tablet Take 1 tablet (20 mg total) by mouth 2 (two) times daily. 07/04/19   Leeanne Rio, MD  ferrous sulfate 325 (65 FE) MG tablet Take 1 tablet (325 mg total) by  mouth 2 (two) times daily with a meal. 06/13/19   Leeanne Rio, MD  fluticasone [facility]) 50 MCG/ACT nasal spray PLACE 2 SPRAYS INTO BOTH NOSTRILS DAILY AS NEEDED FOR ALLERGIES OR RHINITIS. 08/28/19   Leeanne Rio, MD  KLOR-CON M20 20 MEQ tablet TAKE 2 TABLETS BY MOUTH DAILY 07/03/19   Burnell Blanks, MD  loratadine (CLARITIN) 10 MG tablet Take 10 mg by mouth daily as needed for allergies.     [provider]  methocarbamol (ROBAXIN) 500 MG tablet Take 500 mg by mouth 2 (two) times daily as needed for muscle spasms.  05/10/19   [provider]  metoprolol tartrate (LOPRESSOR) 25 MG tablet Take 0.5 tablets (12.5 mg total) by mouth 2 (two) times daily. 05/04/19   Burnell Blanks, MD  nitroGLYCERIN (NITROSTAT) 0.4 MG SL tablet Place  0.4 mg under the tongue every 5 (five) minutes as needed for chest pain.    [provider]  pantoprazole (PROTONIX) 40 MG tablet TAKE 1 TABLET BY MOUTH TWICE A DAY 07/20/19   Leeanne Rio, MD  Polyethyl Glyc-Propyl Glyc PF (SYSTANE ULTRA PF) 0.4-0.3 % SOLN Place 1-2 drops into both eyes every morning.    [provider]  pravastatin (PRAVACHOL) 20 MG tablet Take 1 tablet (20 mg total) by mouth daily at 6 PM. 01/11/19   Leeanne Rio, MD  pregabalin (LYRICA) 75 MG capsule Take 75 mg by mouth 2 (two) times daily.    [provider]  Rivaroxaban (XARELTO) 15 MG TABS tablet Take 1 tablet (15 mg total) by mouth daily with supper. 04/30/19   Burnell Blanks, MD  senna (SENOKOT) 8.6 MG TABS tablet Take 1 tablet (8.6 mg total) by mouth daily as needed for mild constipation. 04/14/18   Nuala Alpha, DO  tamsulosin (FLOMAX) 0.4 MG CAPS capsule Take 1 capsule (0.4 mg total) by mouth daily after supper. To help urine stream 08/23/19   Zenia Resides, MD  torsemide (DEMADEX) 20 MG tablet Take 1 tablet by mouth daily, may take 1 extra tablet daily only as needed for swelling or weight gain of 3 lbs in one day or 5 lbs in a few days 06/05/19   Charlie Pitter, PA-C  traMADol (ULTRAM) 50 MG tablet Take 50 mg by mouth daily as needed. 06/13/19   [provider]  urea (CARMOL) 10 % cream Apply topically as needed. 07/23/19   Guadalupe Dawn, MD    Allergies    Eliquis [apixaban], Gabapentin, Prednisone, and Rosuvastatin  Review of Systems   Review of Systems  Respiratory: Positive for cough.   All other systems reviewed and are negative.   Physical Exam Updated Vital Signs BP 96/67   Pulse (!) 101   Temp (!) 97.5 F (36.4 C) (Oral)   Resp 20   Ht 5\' 11"  (1.803 m)   Wt 80 kg   SpO2 96%   BMI 24.60 kg/m   Physical Exam Vitals and nursing note reviewed.  Constitutional:      General: He is not in acute distress.    Appearance: He is  well-developed.  HENT:     Head: Atraumatic.  Eyes:     Conjunctiva/sclera: Conjunctivae normal.  Cardiovascular:     Rate and Rhythm: Normal rate. Rhythm irregular.     Pulses: Normal pulses.     Heart sounds: Normal heart sounds.  Pulmonary:     Effort: Pulmonary effort is normal.     Breath sounds: Normal  breath sounds. No wheezing, rhonchi or rales.  Abdominal:     Palpations: Abdomen is soft.     Tenderness: There is no abdominal tenderness.  Musculoskeletal:     Cervical back: Neck supple.     Comments: Inspection of the back without any significant midline spine tenderness.  No significant tenderness to lumbar paraspinal muscle.  Skin normal in appearance in his lower back.  5 out of 5 strength all 4 extremities.  Skin:    Findings: No rash.  Neurological:     Mental Status: He is alert.     Comments: Patient is alert to self and place but not to situation, date, or current president.  Psychiatric:        Mood and Affect: Mood normal.     ED Results / Procedures / Treatments   Labs (all labs ordered are listed, but only abnormal results are displayed) Labs Reviewed  COMPREHENSIVE METABOLIC PANEL - Abnormal; Notable for the following components:      Result Value   Glucose, Bld 141 (*)    BUN 49 (*)    Creatinine, Ser 2.81 (*)    Calcium 8.6 (*)    Total Protein 6.2 (*)    Albumin 2.9 (*)    AST 58 (*)    GFR calc non Af Amer 19 (*)    GFR calc Af Amer 21 (*)    All other components within normal limits  CBC - Abnormal; Notable for the following components:   WBC 3.5 (*)    Platelets 75 (*)    All other components within normal limits  D-DIMER, QUANTITATIVE (NOT AT [facility]) - Abnormal; Notable for the following components:   D-Dimer, Quant 1.07 (*)    All other components within normal limits  LACTATE DEHYDROGENASE - Abnormal; Notable for the following components:   LDH 322 (*)    All other components within normal limits  FIBRINOGEN - Abnormal; Notable for  the following components:   Fibrinogen 651 (*)    All other components within normal limits  CBG MONITORING, ED - Abnormal; Notable for the following components:   Glucose-Capillary 156 (*)    All other components within normal limits  POC SARS CORONAVIRUS 2 AG -  ED - Abnormal; Notable for the following components:   SARS Coronavirus 2 Ag POSITIVE (*)    All other components within normal limits  CULTURE, BLOOD (ROUTINE X 2)  CULTURE, BLOOD (ROUTINE X 2)  LACTIC ACID, PLASMA  TRIGLYCERIDES  URINALYSIS, ROUTINE W REFLEX MICROSCOPIC  LACTIC ACID, PLASMA  PROCALCITONIN  FERRITIN  C-REACTIVE PROTEIN    EKG None  ED ECG REPORT   Date: 09/05/2019  Rate: 87  Rhythm: atrial fibrillation  QRS Axis: left  Intervals: normal  ST/T Wave abnormalities: nonspecific ST changes  Conduction Disutrbances:first-degree A-V block   Narrative Interpretation:   Old EKG Reviewed: unchanged  I have personally reviewed the EKG tracing and agree with the computerized printout as noted.   Radiology DG Chest 2 View  Result Date: 09/05/2019 CLINICAL DATA:  Cough.  Back pain. EXAM: CHEST - 2 VIEW COMPARISON:  03/17/2019 FINDINGS: Hyperinflation. Patient rotated left on the frontal radiograph. Normal heart size for level of inspiration. No pleural effusion or pneumothorax. No congestive failure. Vague increased density over the right hemidiaphragm. IMPRESSION: Cardiomegaly and hyperinflation. Possible mild right base airspace disease. Cannot exclude early aspiration or pneumonia. Electronically Signed   By: Abigail Miyamoto M.D.   On: 09/05/2019 11:59  Procedures Procedures (including critical care time)  Medications Ordered in ED Medications  0.9 %  sodium chloride infusion ( Intravenous New Bag/Given 09/05/19 1409)    ED Course  I have reviewed the triage vital signs and the nursing notes.  Pertinent labs & imaging results that were available during my care of the patient were reviewed by me and  considered in my medical decision making (see chart for details).    MDM Rules/Calculators/A&P                      BP (!) 79/59   Pulse 88   Temp (!) 97.5 F (36.4 C) (Oral)   Resp 16   Ht 5\' 11"  (1.803 m)   Wt 80 kg   SpO2 100%   BMI 24.60 kg/m   Final Clinical Impression(s) / ED Diagnoses Final diagnoses:  Respiratory tract infection due to COVID-19 virus  AKI (acute kidney injury) ([place])  Delirium    Rx / DC Orders ED Discharge Orders    None     12:29 PM Patient brought here with concern for productive cough as well as confusion since yesterday.  Also concern for potential Covid exposure as multiple family members was diagnosed with Covid several weeks ago however patient has not been around any family members for nearly 2 weeks.  At this time patient appears to be in no acute discomfort.  Lungs clear on auscultation.  Abdomen is soft nontender.  No significant reproducible back pain on exam.  Chest x-ray obtained showing possible mild right base airspace disease.  Cannot exclude early aspiration or pneumonia. Covid-19 test currently pending.  If negative, will initiate abx treatment for potential bacterial pneumonia.  Care discussed with Dr. Johnney Killian.   1:50 PM COVID-19 antigen test obtained and is positive.  Patient has evidence of AKI with worsening renal function including a BUN 49 creatinine 2.81.  Mild transaminitis with AST 58.  He also has evidence of thrombocytopenia with platelets of 75, it appears patient has had thrombocytopenia in the past.  Diminished WBC of 3.5.  These labs values suggestive and supportive of patient's underlying COVID-19 infection.  Additional inflammatory markers have been ordered.  We will give gentle hydration.  Plan to consult family medicine team for admission.  Currently no hypoxia requiring supplemental oxygen.  2:28 PM Appreciate consultation from [facility] Medicine team who agrees to see pt in the ER and will determine disposition.   Anticipate admission due to delirium, AKI in the setting of covid-19 pneumonia.   [name] was evaluated in Emergency Department on 09/05/2019 for the symptoms described in the history of present illness. He was evaluated in the context of the global COVID-19 pandemic, which necessitated consideration that the patient might be at risk for infection with the SARS-CoV-2 virus that causes COVID-19. Institutional protocols and algorithms that pertain to the evaluation of patients at risk for COVID-19 are in a state of rapid change based on information released by regulatory bodies including the CDC and federal and state organizations. These policies and algorithms were followed during the patient's care in the ED.    Domenic Moras, PA-C 09/05/19 1456    [name], MD 09/07/19 1235

## 2019-09-05 NOTE — Progress Notes (Signed)
I saw and examined this patient.  I discussed with the full admitting resident team.  We have agreed on a plan.  I will co-sign the H&PE when it is available.  Briefly, 86 yo male exposed to [place].  Current sx include cough and likely a modest increase in confusion.  Daughter has noted some heavier than usual breathing.  Issues: 1. COVID + in patient at high risk for serious disease due to age.  His exposure was 13 days ago.  He has no documented fever and thus far his sx are mild.  Given his high risk age, we will admit and monitor for progression of sx.   2. R/O complicated UTI.  Has frequency and worsening incontinence plus he has left sided low back pain.  Need to check urine culture and post void residual.   See resident note for comprehensive listing of problems.

## 2019-09-05 NOTE — ED Triage Notes (Signed)
Pt arrives POV for eval of cough w/ dark brown sputum last evening. Daughter denies fever/chills, reports worsening confusion. Endorses ongoing back pain since epidural 2/4 for procedure. Daughter reports PCP concerned for possible covid, and advised to present here

## 2019-09-05 NOTE — H&P (Addendum)
[facility] Admission History and Physical Service Pager: [phone]  Patient name: [name] Medical record number: [id number] Date of birth: [birth year] Age: 86 y.o. Gender: male  Primary Care Provider: Leeanne Rio, MD Consultants: None Code Status: Full Preferred Emergency Contact: Elliott Lasecki (daughter) [phone]  Chief Complaint: cough and confusion  Assessment and Plan: [name] is a 86 y.o. male presenting with new cough and confusion who is positive for COVID. PMH is significant for HTN, DVT, CVA, Combined systolic and diastolic heart failure, Chronic ITP,CKD,    COVID infection Family visited him on feb 11th and multiple members then tested positive for covid the following day. Pt had worsening productive cough, increased confusion over the past two days. Sent by [facility] to ED to get evaluated today and tested positive for COVID.  Labs significant for elevated D Dimer 1.07, CRP 9.9, Fibrinogen 651, LDH 322.  No elevated WBC and Blood Cultures pending.  Chest xray cannot exclude aspiration or pneumonia.  On exam lung sounds diminished at bases. No oxygen requirement. No increased wob. Will admit d/t age/comorbidities as well as aki and increased confusion. Also Considered in differentials PE, CHF, bacterial Pneumonia.  -Admit to Med Surg, Attending Dr. Andria Frames -Start Remdesivir (02/24-03/01) -vital signs per unit -continuous cardiac and pulse ox monitoring -Monitor respiratory status -Maintain oxygen saturations >94% -Airborne and contact precautions -PT/OT eval -CMP, CBC,D-Dimer, Ferritin, CRP in am  HFrEF Last Echo (08/19) LVEF 30-35%.  Systolic function was moderately to severely reduced. Diffuse hypokinesis. Mild mitral regurgitation. Mild left atrial dilation.  Enlarged right atrium and right ventricle enlarged with decreased function. Home medication includes Torsemide 20 mg daily and metoprolol tartrate 12.5mg   BID. On exam patient appears euvolemic. -Hold Torsemide in setting of AKI - continue metoprolol -Strict I&O's -Daily weight -Monitor fluid status -BMP in am  AKI on CKD Serum Cr 2.81.  Baseline 1.57-2.12.  Likely secondary to dehydration and Torsemide. No evidence of volume overload on exam or imaging. Patient was started on 120ml/hr NS in ED.  -Hold Torsemide in the setting of AKI -Hold potassium, K 4.2  -BMP in am -Avoid nephrotoxic medications - stopping mIVF d/t HFrEF  Confusion Likely secondary to infection/dehydration with some underlying aspect of dementia. Was alert and oriented to person, place, situation, but stated the year was 'February and could not state the year after repeated inquiry. No oxygen requirement so not hypoxia related.  - re-evaluate in AM  Increased urination  incontinence Pt daughter reporting that at baseline he is continent of urine, but she has had to change/wash his pants several times over the past few days due to urination.  Pt denies dysuria, but with his confusion could have UTI, although he is afebrile and w/o leukocytosis.  - UA and urine culture    HTN Chronic. Stable. Initial BP in ED 203/189, which was an error. Now normotensive.  Daughter reports patient had not taken any medications since yesterday.  Home medications include Metoprolol 12.5 mg BID, Torsemide 20 mg daily, Potassium 20 meq daily. -Continue Metoprolol 12.5 mg BID -Hold Torsemide in the setting of AKI -Hold Potassium  Afib Chronic. Stable.  Denies any chest pain, palpations or shortness of breath.  Home medications include Xarelto 15 mg daily and Metoprolol 12.5 mg twice daily -Continue Xarelto 15 mg daiy -Continue Metoprolol 12.5 mg BID  Chronic Back Pain Patient reports herniated disc for which he receives steroid injections. Last received 02/04.  Daughter reports that he was  having left sided back pain after injection.  Home medications Ibuprofen 200 mg daily, Robaxin  500 mg qhs and Lyrica 75 mg BID.On exam no pain on palpation. -Avoid NSAIDS, in setting of AKI -Continue Robaxin 500 mg at night -Continue Lyrica 75 mg BID -Monitor for increasing pain -PT to eval  HLD Chronic. Stable.  Lipid Profile(11/20) wnl.  Home medication pravastatin 20 mg daily -Continue Pravastatin 20 mg daily  H/O Rt leg DVT Chronic. Stable.Denies any leg pain or swelling or shortness of breath.  Home medication include Xarelto 15 mg daily -Continue Xarelto 15 mg daily -PT/OT eval  CAD s/p stent placement Chronic. Stable. History of MI.  Stents placed in 2009 and 2019.  Denies any chest pain or shortness of breath.  Home medication include Metoprolol 12.5 mg BID.  Nitroglycerine 0.4 mg s/l although patient reports has not had to use in years. -Continuous cardiac monitoring. -Continue Metoprolol 12.5 mg BID -Hold home Nitroglycerine for prn chest pain  BPH Chronic. Stable.  Denies any difficulty initiating stream.  Does reports recent increase in urination.  Appointment to see urology 02/25.  Home medication Flomax 0.4mg  daily -Continue Flomax 0.4 mg daily -Continue to monitor  Chronic ITP Chronic. Stable. On admission platelets 75. Baseline 110-133.  Denies any active bleeding.   -Continue to monitor -CBC in am  GERD Chronic. Stable.  Home medications include Pepcid 20 mg daily and Protonix 40 mg twice daily. Denies any abdominal pain. -Continue Pepcid 20 mg daily -Continue Protonix 40 mg twice daily  FEN/GI:  -Heart Healthy Prophylaxis:  -Xarelto  Disposition: Med Surg, Attending Dr Andria Frames  History of Present Illness:  [name] is a 86 y.o. male presenting with cough and confusion.  History obtained from patient and daughter.  Pt reports he is not sure why he is here.  He states he is not feeling ill.  Daughter reports patient has had new cough and some confusion for the past few days.  She also reports increased urination and thought he had  urine infection.  She had made an appointment to for him to see the urologist tomorrow.  Daughter reports that patient started cough up dark brown phlegm and he appeared to be panting when dressing.  She felt that he had something on his chest and thought he may have pneumonia as he had similar symptoms when he had pneumonia a while back.  Patient denies any chest pain, shortness of breath, fevers, nausea or vomiting.  Reports having decreased appetite over the past few days.  Has not noticed any decrease in smell but reports things didn't taste good.  He was recently in contact with family members who all tested positive for COVID.    He lives by himself but has good family support.  His daughter reports that they can provide 24 hr care if needed once he is able to return home.  He has a walker to ambulate and has not had any recent falls.  In the ED he was afebrile, BP 110/67, A Fib rate controlled, and RR 16 with oxygen saturations 98% on room air.  Chest xray show cardiomegaly and hyperinflation.  Possible mild right base airspace disease and cannot exclude early aspiration or pneumonia.  COVID positive.  Labs significant for hyperglycemia, elevated BUN, Cr, AST, CRP, Ferritin, LDH, D-Dimer, Fibrinogen 651 and low platelets. ECG shows no STEMI.  Review Of Systems: Per HPI with the following additions:   Review of Systems  Constitutional: Negative for chills, fever  and weight loss.  HENT: Negative for congestion and nosebleeds.   Respiratory: Positive for cough and sputum production. Negative for hemoptysis and shortness of breath.   Cardiovascular: Negative for chest pain, palpitations, orthopnea and leg swelling.  Gastrointestinal: Negative for abdominal pain, blood in stool, constipation, diarrhea, nausea and vomiting.  Genitourinary: Positive for frequency. Negative for hematuria.  Musculoskeletal: Positive for back pain and neck pain.  Neurological: Positive for sensory change. Negative for  dizziness, seizures, loss of consciousness, weakness and headaches.  Endo/Heme/Allergies: Bruises/bleeds easily.  Psychiatric/Behavioral: Negative for depression, memory loss and suicidal ideas. The patient does not have insomnia.     Patient Active Problem List   Diagnosis Date Noted  . Pneumonia due to COVID-19 virus 09/05/2019  . Urinary hesitancy 08/23/2019  . Frequent urination 07/30/2019  . Actinic keratosis 07/30/2019  . Skin tear of forearm without complication 61/60/7371  . Abrasion of left index finger 05/25/2019  . Vision changes 05/25/2019  . Gastritis and gastroduodenitis   . Symptomatic anemia   . Acute anemia 05/15/2019  . Chronic kidney disease, stage 4 (severe) ([place]) 05/15/2019  . GI bleed 05/15/2019  . Near syncope   . GERD (gastroesophageal reflux disease) 01/11/2019  . Allergic rhinitis 01/11/2019  . History of GI bleed 03/17/2018  . Persistent atrial fibrillation ([place])   . Cough 04/25/2017  . Chronic ITP (idiopathic thrombocytopenia) (HCC)   . Coronary atherosclerosis of native coronary artery   . HTN (hypertension)   . Hyperlipidemia 04/23/2013  . Benign prostate hyperplasia 10/04/2011  . Anemia 09/16/2011  . Congestive heart failure with left ventricular diastolic dysfunction ([place]) 05/25/2011    Past Medical History: Past Medical History:  Diagnosis Date  . Abnormal CT of the chest 07/21/2016   January 2018 IMPRESSION: 1. Residual patchy ground-glass opacities throughout both lungs at the resolved areas of consolidation from the 05/16/2016 chest CT, consistent with a resolving infectious or inflammatory process. 2. Bandlike opacity in the anterior right upper lobe is new since 05/16/2016, was probably present on 06/17/2016 chest radiograph, favor evolving postinfectious/postinflammatory   . Allergic rhinitis 01/11/2019  . Benign prostate hyperplasia 10/04/2011  . Bladder neck obstruction 10/04/2011  . Cerebrovascular accident (CVA) ([place]) 08/09/2016  .  Chronic combined systolic (congestive) and diastolic (congestive) heart failure ([place])   . Chronic ITP (idiopathic thrombocytopenia) (HCC)   . Chronic kidney disease, stage 4 (severe) ([place]) 05/15/2019  . Coronary atherosclerosis of native coronary artery    a. Anterior STEMI 2009 s/p BMSx2 to mid & prox LAD and angioplasty to distal LAD. total RCA with collaterals, moderate Cx plaquing. a. NSTEMI 02/2018 - chronically occluded RCA, patent LAD stents and severe stenosis in the obtuse marginal branch treated with a drug eluting stent.  . Degenerative disk disease 12/20/2010   Sees Dr. Eddie Dibbles.  Has had MRI.   . Enlarged prostate with lower urinary tract symptoms (LUTS) 06/09/2011  . GERD (gastroesophageal reflux disease) 01/11/2019  . Gross hematuria   . Hemoptysis 05/16/2016  . History of GI bleed 03/17/2018   Admitted 03/17/18 with acute GI bleeding. EGD showed gastritis but no active bleeding. Aspirin stopped and Plavix and Xareltro continued.   [name] HTN (hypertension)   . Hyperlipidemia 04/23/2013  . Hypotension   . Iron deficiency anemia   . Ischemic cardiomyopathy   . Microscopic hematuria 09/28/2014  . Myocardial infarct ([place]) 03/21/2008  . NSTEMI (non-ST elevated myocardial infarction) ([place]) 05/16/2016  . NSVT (nonsustained ventricular tachycardia) ([place])    a. Per DC summary from  time of STEMI 2009.  . Orthostatic hypotension 11/26/2016  . PAD (peripheral artery disease) ([place]) 08/13/2016  . Paroxysmal atrial flutter ([place])   . Persistent atrial fibrillation ([place])   . Phlebitis and thrombophlebitis of superficial vessels of lower extremities   . Pure hypercholesterolemia    a. Has not tolerated statins in the past.  . Right leg DVT ([place])    a. Dx 01/2014.  [name] Sinus bradycardia    a. By prior event monitor.   . Thrombocytopenia ([place])   . Urinary retention 06/20/2011    Past Surgical History: Past Surgical History:  Procedure Laterality Date  . BIOPSY  05/17/2019   Procedure: BIOPSY;  Surgeon:  Thornton Park, MD;  Location: [facility];  Service: Gastroenterology;;  . CARPAL TUNNEL RELEASE    . CORONARY STENT INTERVENTION N/A 03/06/2018   Procedure: CORONARY STENT INTERVENTION;  Surgeon: Lorretta Harp, MD;  Location: [facility] CV [facility];  Service: Cardiovascular;  Laterality: N/A;  . CORONARY STENT PLACEMENT  2009  . ESOPHAGOGASTRODUODENOSCOPY (EGD) WITH PROPOFOL N/A 03/19/2018   Procedure: ESOPHAGOGASTRODUODENOSCOPY (EGD) WITH PROPOFOL;  Surgeon: Wonda Horner, MD;  Location[facility] ENDOSCOPY;  Service: Endoscopy;  Laterality: N/A;  . ESOPHAGOGASTRODUODENOSCOPY (EGD) WITH PROPOFOL N/A 05/17/2019   Procedure: ESOPHAGOGASTRODUODENOSCOPY (EGD) WITH PROPOFOL;  Surgeon: Thornton Park, MD;  Location: [facility];  Service: Gastroenterology;  Laterality: N/A;  . LEFT HEART CATH AND CORONARY ANGIOGRAPHY N/A 03/06/2018   Procedure: LEFT HEART CATH AND CORONARY ANGIOGRAPHY;  Surgeon: Lorretta Harp, MD;  Location: [facility];  Service: Cardiovascular;  Laterality: N/A;  . REPLACEMENT TOTAL KNEE BILATERAL     Left 2014 (Dr. Eddie Dibbles); right 2012 (Dr. Redmond Pulling)  . trigger finger surgery      Social History: Social History   Tobacco Use  . Smoking status: Never Smoker  . Smokeless tobacco: Never Used  Substance Use Topics  . Alcohol use: No  . Drug use: No   Additional social history:None Please also refer to relevant sections of EMR.  Family History: Family History  Problem Relation Age of Onset  . Cancer Sister   . Cancer Sister     Allergies and Medications: Allergies  Allergen Reactions  . Eliquis [Apixaban] Other (See Comments)    Causes swollen ankles and blot clot in eyes  . Gabapentin   . Prednisone Other (See Comments)    "Made me not feel right when I took it"  . Rosuvastatin Other (See Comments)    "Makes me ache"   No current facility-administered medications on file prior to encounter.   Current Outpatient Medications on File Prior to Encounter   Medication Sig Dispense Refill  . cephALEXin (KEFLEX) 500 MG capsule TAKE 4 CAPSULES BY MOUTH 1 HR PRIOR TO DENTAL TREATMENT    . famotidine (PEPCID) 20 MG tablet Take 1 tablet (20 mg total) by mouth 2 (two) times daily. 180 tablet 1  . ferrous sulfate 325 (65 FE) MG tablet Take 1 tablet (325 mg total) by mouth 2 (two) times daily with a meal. 180 tablet 1  . fluticasone (FLONASE) 50 MCG/ACT nasal spray PLACE 2 SPRAYS INTO BOTH NOSTRILS DAILY AS NEEDED FOR ALLERGIES OR RHINITIS. 16 mL 3  . KLOR-CON M20 20 MEQ tablet TAKE 2 TABLETS BY MOUTH DAILY 180 tablet 2  . loratadine (CLARITIN) 10 MG tablet Take 10 mg by mouth daily as needed for allergies.     . methocarbamol (ROBAXIN) 500 MG tablet Take 500 mg by mouth 2 (two) times  daily as needed for muscle spasms.     . metoprolol tartrate (LOPRESSOR) 25 MG tablet Take 0.5 tablets (12.5 mg total) by mouth 2 (two) times daily. 30 tablet 11  . nitroGLYCERIN (NITROSTAT) 0.4 MG SL tablet Place 0.4 mg under the tongue every 5 (five) minutes as needed for chest pain.    . pantoprazole (PROTONIX) 40 MG tablet TAKE 1 TABLET BY MOUTH TWICE A DAY 180 tablet 1  . Polyethyl Glyc-Propyl Glyc PF (SYSTANE ULTRA PF) 0.4-0.3 % SOLN Place 1-2 drops into both eyes every morning.    . pravastatin (PRAVACHOL) 20 MG tablet Take 1 tablet (20 mg total) by mouth daily at 6 PM. 90 tablet 1  . pregabalin (LYRICA) 75 MG capsule Take 75 mg by mouth 2 (two) times daily.    . Rivaroxaban (XARELTO) 15 MG TABS tablet Take 1 tablet (15 mg total) by mouth daily with supper. 30 tablet 6  . senna (SENOKOT) 8.6 MG TABS tablet Take 1 tablet (8.6 mg total) by mouth daily as needed for mild constipation. 30 each 0  . tamsulosin (FLOMAX) 0.4 MG CAPS capsule Take 1 capsule (0.4 mg total) by mouth daily after supper. To help urine stream 30 capsule 3  . torsemide (DEMADEX) 20 MG tablet Take 1 tablet by mouth daily, may take 1 extra tablet daily only as needed for swelling or weight gain of 3 lbs  in one day or 5 lbs in a few days 60 tablet 3  . traMADol (ULTRAM) 50 MG tablet Take 50 mg by mouth daily as needed.    . urea (CARMOL) 10 % cream Apply topically as needed. 71 g 0    Objective: BP 117/69   Pulse 84   Temp (!) 97.5 F (36.4 C) (Oral)   Resp 14   Ht 5\' 11"  (1.803 m)   Wt 80 kg   SpO2 98%   BMI 24.60 kg/m  Exam: General: 86 year old male in no acute distress Eyes: PERRLA ENTM: Mucous membranes moist. Right pinna partially surgically removed.  Neck: Supple, nontender, no JVD appreciated Cardiovascular: Irregularly rate and rhythm, A. fib, no murmurs or gallops appreciated, distal pulses present Respiratory: Lungs diminished at bases, no crackles or wheezes appreciated, good cap refill Gastrointestinal: Soft, nontender, nondistended, bowel sounds present, no CVA tenderness Extremities: No lower extremity edema, 5 out of 5 strength upper and lower extremities bilaterally Neuro: Alert and oriented to person, place and month, not year.  Cranial II-XII intact, motor and sensation normal   Labs and Imaging: CBC BMET  Recent Labs  Lab 09/05/19 1137  WBC 3.5*  HGB 14.4  HCT 46.2  PLT 75*   Recent Labs  Lab 09/05/19 1137  NA 139  K 4.2  CL 99  CO2 27  BUN 49*  CREATININE 2.81*  GLUCOSE 141*  CALCIUM 8.6*     EKG:  Vent. rate 87 BPM PR interval * ms QRS duration 84 ms QT/QTc 380/457 ms P-R-T axes * 257 83 Atrial fibrillation Possible Right ventricular hypertrophy Inferior infarct , age undetermined Anterolateral infarct , age undetermined Abnormal ECG  DG Chest 2 View  Result Date: 09/05/2019 CLINICAL DATA:  Cough.  Back pain. EXAM: CHEST - 2 VIEW COMPARISON:  03/17/2019 FINDINGS: Hyperinflation. Patient rotated left on the frontal radiograph. Normal heart size for level of inspiration. No pleural effusion or pneumothorax. No congestive failure. Vague increased density over the right hemidiaphragm. IMPRESSION: Cardiomegaly and hyperinflation.  Possible mild right base airspace disease. Cannot exclude early aspiration  or pneumonia. Electronically Signed   By: Abigail Miyamoto M.D.   On: 09/05/2019 11:59    Carollee Leitz, MD 09/05/2019, 4:11 PM  PGY-1, [place] Intern pager: [phone], text pages welcome  Resident Addendum I have separately seen and examined the patient.  I have discussed the findings and exam with the resident and agree with the above note.  I helped develop the management plan that is described in the resident's note and I agree with the content.  Changes have been made in BLUE.    Addison Naegeli, MD PGY-2 [facility] residency program

## 2019-09-05 NOTE — Progress Notes (Signed)
[place] Telemedicine Visit  Patient consented to have virtual visit. Method of visit: Telephone  Encounter participants: Patient: [name] - located at home Provider: [place] Davontay Wat[place] - located at [facility]  Others (if applicable): [name]- daughter  Chief Complaint: Coughing and Covid exposure  HPI:  Daughter is calling because she is concerned about four 86 year old dad.  Reports that he has been coughing all night and has been coughing up some brown phlegm.  She denies any fevers and states his last temperature was 97 degrees.  She reports that multiple family members saw him on February 11 and then some were diagnosed on February 12 and others diagnosed on February 14 is being Covid positive.  He has not been around them since February 11.  She denies any increased work of breathing.  Does state however that he is more confused than normal and is a bit more sleepy than his baseline. No increased swelling. Wt 173.8lb, yesterday 175.0lb. daughter states that over the past few weeks he has not been doing well.  He saw Dr. Ronalee Red on February 4 for a back injection and has been having increased pain since then.  He went back to the doctor on Monday and did not have a very good visit.  She is concerned that he may be developing pneumonia or that he may have Covid.  ROS: per HPI  Pertinent PMHx: CAD, HTN, persistent A. fib, HFpEF  Exam:  Spoke with daughter over the phone  Assessment/Plan:  Cough Patient with no history of COPD or asthma.  Daughter states he had multiple family members who tested positive for Covid on February 12 and February 14 the patient was around them on February 11.  Overnight he has developed productive cough.  The daughter states that he is more confused and somnolent.  Given patient's age and high risk for Covid infection and his altered mental status recommended that he be taken to the emergency room to be evaluated.  Family with no O2  monitor in the home.  Family voiced understanding and plan to take him to the emergency room as soon as they are able.   Time spent during visit with patient: 11 minutes  [place] Nastacia Raybuck, DO PGY-3, [place]

## 2019-09-06 ENCOUNTER — Telehealth: Payer: Self-pay

## 2019-09-06 DIAGNOSIS — I130 Hypertensive heart and chronic kidney disease with heart failure and stage 1 through stage 4 chronic kidney disease, or unspecified chronic kidney disease: Secondary | ICD-10-CM | POA: Diagnosis present

## 2019-09-06 DIAGNOSIS — E860 Dehydration: Secondary | ICD-10-CM | POA: Diagnosis present

## 2019-09-06 DIAGNOSIS — I252 Old myocardial infarction: Secondary | ICD-10-CM | POA: Diagnosis not present

## 2019-09-06 DIAGNOSIS — Z8673 Personal history of transient ischemic attack (TIA), and cerebral infarction without residual deficits: Secondary | ICD-10-CM | POA: Diagnosis not present

## 2019-09-06 DIAGNOSIS — R32 Unspecified urinary incontinence: Secondary | ICD-10-CM | POA: Diagnosis present

## 2019-09-06 DIAGNOSIS — I739 Peripheral vascular disease, unspecified: Secondary | ICD-10-CM | POA: Diagnosis present

## 2019-09-06 DIAGNOSIS — Z888 Allergy status to other drugs, medicaments and biological substances status: Secondary | ICD-10-CM | POA: Diagnosis not present

## 2019-09-06 DIAGNOSIS — U071 COVID-19: Secondary | ICD-10-CM | POA: Diagnosis present

## 2019-09-06 DIAGNOSIS — R7401 Elevation of levels of liver transaminase levels: Secondary | ICD-10-CM | POA: Diagnosis present

## 2019-09-06 DIAGNOSIS — Z96653 Presence of artificial knee joint, bilateral: Secondary | ICD-10-CM | POA: Diagnosis present

## 2019-09-06 DIAGNOSIS — N184 Chronic kidney disease, stage 4 (severe): Secondary | ICD-10-CM

## 2019-09-06 DIAGNOSIS — F0390 Unspecified dementia without behavioral disturbance: Secondary | ICD-10-CM | POA: Diagnosis present

## 2019-09-06 DIAGNOSIS — D693 Immune thrombocytopenic purpura: Secondary | ICD-10-CM | POA: Diagnosis present

## 2019-09-06 DIAGNOSIS — I5042 Chronic combined systolic (congestive) and diastolic (congestive) heart failure: Secondary | ICD-10-CM | POA: Diagnosis present

## 2019-09-06 DIAGNOSIS — R410 Disorientation, unspecified: Secondary | ICD-10-CM | POA: Diagnosis present

## 2019-09-06 DIAGNOSIS — I2510 Atherosclerotic heart disease of native coronary artery without angina pectoris: Secondary | ICD-10-CM | POA: Diagnosis present

## 2019-09-06 DIAGNOSIS — E785 Hyperlipidemia, unspecified: Secondary | ICD-10-CM | POA: Diagnosis present

## 2019-09-06 DIAGNOSIS — K219 Gastro-esophageal reflux disease without esophagitis: Secondary | ICD-10-CM | POA: Diagnosis present

## 2019-09-06 DIAGNOSIS — G8929 Other chronic pain: Secondary | ICD-10-CM | POA: Diagnosis present

## 2019-09-06 DIAGNOSIS — J1282 Pneumonia due to coronavirus disease 2019: Secondary | ICD-10-CM | POA: Diagnosis present

## 2019-09-06 DIAGNOSIS — I4819 Other persistent atrial fibrillation: Secondary | ICD-10-CM | POA: Diagnosis present

## 2019-09-06 DIAGNOSIS — N179 Acute kidney failure, unspecified: Secondary | ICD-10-CM | POA: Diagnosis present

## 2019-09-06 DIAGNOSIS — Z955 Presence of coronary angioplasty implant and graft: Secondary | ICD-10-CM | POA: Diagnosis not present

## 2019-09-06 DIAGNOSIS — M549 Dorsalgia, unspecified: Secondary | ICD-10-CM | POA: Diagnosis present

## 2019-09-06 LAB — CBC WITH DIFFERENTIAL/PLATELET
Abs Immature Granulocytes: 0.02 10*3/uL (ref 0.00–0.07)
Basophils Absolute: 0 10*3/uL (ref 0.0–0.1)
Basophils Relative: 0 %
Eosinophils Absolute: 0 10*3/uL (ref 0.0–0.5)
Eosinophils Relative: 0 %
HCT: 44 % (ref 39.0–52.0)
Hemoglobin: 14.2 g/dL (ref 13.0–17.0)
Immature Granulocytes: 1 %
Lymphocytes Relative: 18 %
Lymphs Abs: 0.5 10*3/uL — ABNORMAL LOW (ref 0.7–4.0)
MCH: 28 pg (ref 26.0–34.0)
MCHC: 32.3 g/dL (ref 30.0–36.0)
MCV: 86.6 fL (ref 80.0–100.0)
Monocytes Absolute: 0.3 10*3/uL (ref 0.1–1.0)
Monocytes Relative: 8 %
Neutro Abs: 2.2 10*3/uL (ref 1.7–7.7)
Neutrophils Relative %: 73 %
Platelets: 76 10*3/uL — ABNORMAL LOW (ref 150–400)
RBC: 5.08 MIL/uL (ref 4.22–5.81)
RDW: 14.3 % (ref 11.5–15.5)
WBC: 3 10*3/uL — ABNORMAL LOW (ref 4.0–10.5)
nRBC: 0 % (ref 0.0–0.2)

## 2019-09-06 LAB — COMPREHENSIVE METABOLIC PANEL
ALT: 23 U/L (ref 0–44)
AST: 58 U/L — ABNORMAL HIGH (ref 15–41)
Albumin: 2.7 g/dL — ABNORMAL LOW (ref 3.5–5.0)
Alkaline Phosphatase: 75 U/L (ref 38–126)
Anion gap: 12 (ref 5–15)
BUN: 45 mg/dL — ABNORMAL HIGH (ref 8–23)
CO2: 29 mmol/L (ref 22–32)
Calcium: 8.6 mg/dL — ABNORMAL LOW (ref 8.9–10.3)
Chloride: 103 mmol/L (ref 98–111)
Creatinine, Ser: 2.36 mg/dL — ABNORMAL HIGH (ref 0.61–1.24)
GFR calc Af Amer: 27 mL/min — ABNORMAL LOW (ref >60)
GFR calc non Af Amer: 23 mL/min — ABNORMAL LOW (ref >60)
Glucose, Bld: 126 mg/dL — ABNORMAL HIGH (ref 70–99)
Potassium: 4.5 mmol/L (ref 3.5–5.1)
Sodium: 144 mmol/L (ref 135–145)
Total Bilirubin: 1 mg/dL (ref 0.3–1.2)
Total Protein: 5.8 g/dL — ABNORMAL LOW (ref 6.5–8.1)

## 2019-09-06 LAB — C-REACTIVE PROTEIN: CRP: 10 mg/dL — ABNORMAL HIGH (ref <1.0)

## 2019-09-06 LAB — URINE CULTURE: Culture: <10000 — AB

## 2019-09-06 LAB — D-DIMER, QUANTITATIVE: D-Dimer, Quant: 0.9 ug/mL-FEU — ABNORMAL HIGH (ref 0.00–0.50)

## 2019-09-06 LAB — FERRITIN: Ferritin: 427 ng/mL — ABNORMAL HIGH (ref 24–336)

## 2019-09-06 MED ORDER — FAMOTIDINE 20 MG PO TABS
20.0000 mg | ORAL_TABLET | Freq: Every day | ORAL | Status: DC
  Administered 2019-09-07: 20 mg via ORAL
  Filled 2019-09-06 (×2): qty 1

## 2019-09-06 NOTE — Progress Notes (Signed)
Family Medicine Teaching Service Daily Progress Note Intern Pager: [phone]  Patient name: [name] Medical record number: [id number] Date of birth: 02-23-1924 Age: 86 y.o. Gender: male  Primary Care Provider: Leeanne Rio, MD Consultants: None Code Status: Full  [name]:  02/24-admitted  Assessment and Plan: [name] is a 86 y.o. male presenting with new cough and confusion who is positive for COVID. PMH is significant for HTN, DVT, CVA, Combined systolic and diastolic heart failure, Chronic ITP,CKD,    COVID infection Continues to be afebrile.  Oxygen saturation 88% requiring 3 L O2 has since been decreased to 2 L now satting at 95%.  Remains on remdesivir.  Blood cultures pending. Lungs clear to auscultation bilaterally.  No increase work of breathing.  -Continue remdesivir (02/24-03/01) -Pt has allergy to Prednisone so will not start Decadron at this time -Continuous cardiac and pulse ox monitoring -Vital signs per unit -Maintain oxygen saturation greater than 94% -Monitor respiratory status -PT/OT to eval -CBC in a.m.  HFrEF Stable overnight.  Urine output 250 mLs.  Patient appears euvolemic. -Continue to hold torsemide -Continue metoprolol 12.5 mg twice daily -Strict I's and O -Daily weights -Monitor fluid status  AKI on CKD Creatinine trending downward.  2.36>2.81.  Baseline 1.57-2.2. -Oral hydration -Hold torsemide -Hold potassium -Avoid nephrotoxic agents -BMP in a.m.  Confusion Continues to be confused.  Oriented to self, place.  Difficulty finding words.  Suspect some underlying aspect of dementia could be from Covid infection. -continue to monitor mentation  Increased urination  incontinence Urinalysis negative.  Culture pending.  Reports no dysuria.  On exam negative CVA tenderness. -Follow-up culture    HTN Chronic.  Stable.  Normotensive overnight.  Denies any chest pain, headaches or  dizziness. -Continue metoprolol 12.5 mg twice daily -Continue to hold torsemide in the setting of AKI -Continue to hold potassium  Afib Chronic.  Stable.  Home medication includes Xarelto and metoprolol -Continue Xarelto 15 mg daily -Continue metoprolol 12.5 mg twice daily  Chronic Back Pain Chronic.  Stable.  Home medications ibuprofen 200 mg daily, Robaxin 500 mg nightly and Lyrica 75 mg twice daily -Avoid NSAIDs -Continue Robaxin 500 mg nightly -Continue Lyrica 75 mg twice daily -PT/OT eval  HLD Chronic.  Stable.  Lipid profile WNL (11/20) home medication pravastatin -Continue pravastatin 20 mg daily  H/O Rt leg DVT Chronic.  Stable.  Denies any leg swelling or pain.  No shortness of breath.  Home medication include Xarelto -Continue Xarelto 15 mg daily -PT/OT eval  CAD s/p stent placement Chronic.  Stable.  Denies any chest pain or shortness of breath.  Home medications include metoprolol and as needed nitroglycerin  -Continue metoprolol 12.5 mg twice daily -Continuous cardiac monitoring  BPH Chronic.  Stable.  Denies any difficulty urinating.  Has had some increased urinary incontinence lately.  Home medication Flomax -Continue Flomax 0.4 mg daily -Follow-up urology outpatient -Continue to monitor  Chronic ITP Chronic.  Stable.  Platelets 76 today.  Baseline 110-133.  Hemoglobin stable.  Denies any bleeding -Continue to monitor -CBC in a.m.  GERD Chronic.  Stable.  Home medications include Pepcid and Protonix.  Denies any abdominal pain. -Continue Pepcid 20 mg daily -Continue Protonix 40 mg twice daily  FEN/GI:  -Heart Healthy Prophylaxis:  -Xarelto  Disposition: Home when medically stable  Subjective:  No acute events overnight.  Pleasantly confused but following commands.  Up to commode with assistance.  Denies any headaches, dizziness, chest  pain or shortness of breath.  Objective: Temp:  [97.5 F (36.4 C)-98.4 F (36.9 C)] 97.8 F (36.6 C)  (02/24 2059) Pulse Rate:  [84-107] 100 (02/25 0523) Resp:  [13-25] 17 (02/25 0523) BP: (76-203)/(54-189) 111/54 (02/25 0410) SpO2:  [84 %-100 %] 95 % (02/25 0523) Weight:  [77.1 kg-80 kg] 77.1 kg (02/25 0500) Physical Exam: General: Pleasant 86 year old gentleman no acute distress Cardiovascular: Rate and rhythm, no gallops or murmurs appreciated Respiratory: Chest clear to auscultation bilaterally, no wheezes or crackles Abdomen: Soft, nontender, nondistended, bowel sounds present Extremities: No lower extremity edema  Laboratory: Recent Labs  Lab 09/05/19 1137 09/06/19 0417  WBC 3.5* 3.0*  HGB 14.4 14.2  HCT 46.2 44.0  PLT 75* 76*   Recent Labs  Lab 09/05/19 1137 09/06/19 0417  NA 139 144  K 4.2 4.5  CL 99 103  CO2 27 29  BUN 49* 45*  CREATININE 2.81* 2.36*  CALCIUM 8.6* 8.6*  PROT 6.2* 5.8*  BILITOT 1.1 1.0  ALKPHOS 81 75  ALT 25 23  AST 58* 58*  GLUCOSE 141* 126*      Imaging/Diagnostic Tests:   Carollee Leitz, MD 09/06/2019, 5:31 AM PGY-1, [facility] Intern pager: [phone], text pages welcome

## 2019-09-06 NOTE — Telephone Encounter (Signed)
Leanna Sato, patient's daughter, calls nurse line to request update on patient's status. Daughter reports that she has tried calling the hospital and has not been able to receive adequate update.   Forwarding to resident that rounded on patient this AM.   Talbot Grumbling, RN

## 2019-09-06 NOTE — Progress Notes (Signed)
FPTS Interim Progress Note  Spoke with Mr. Fanfan daughter [name]. She is concerned about her father's kidney function. She was inquiring whether this was something to do with his recent epidural injection at the ortho clinic. I explained that his creatinine and GFR which are the common things we look at in the hospital to assess kidney function is showing improvement since stopping Lasix and was likely due to dehydration. We will continue to monitor this. She is also concern about his mental status. She wants to visit if possible but understands if she cannot. She believes he would be less confused if she could visit. She would like for [place] to let him know that she asked about him and that she has called on his behalf. We will call again tomorrow with an update.  Gerlene Fee, DO 09/06/2019, 5:22 PM PGY-1, [facility] Medicine Service pager [phone]

## 2019-09-06 NOTE — Progress Notes (Addendum)
   09/06/19 8850  Family/Significant Other Communication  Family/Significant Other Update Called;Updated (pt's daughter [name])    daugther requesting update from MD at earliest convenience. Resident paged to inform.

## 2019-09-06 NOTE — Discharge Summary (Addendum)
[facility] Discharge Summary  Patient name: [name] Medical record number: [id number] Date of birth: August 17, 1923 Age: 86 y.o. Gender: male Date of Admission: 09/05/2019  Date of Discharge: 09/08/2019 Admitting Physician: Gerlene Fee, DO  Primary Care Provider: Leeanne Rio, MD Consultants: None  Indication for Hospitalization: COVID  Discharge Diagnoses/Problem List:  COVID Confusion AKI   Disposition: Home with 24 hr assistance and Home health  Discharge Condition: stable  Discharge Exam: 09/08/2019 per Dr Jerrol Banana note BP 106/62 (BP Location: Right Arm)   Pulse (!) 113   Temp 98.6 F (37 C) (Axillary)   Resp 18   Ht 5\' 11"  (1.803 m)   Wt 77.1 kg   SpO2 93%   BMI 23.71 kg/m  General: Pleasant 86 year old male in no acute distress Cardiovascular: Irregular rate and rhythm, no gallops or murmurs appreciated, distal pulses present Respiratory: Chest clear to auscultation bilaterally, no crackles or wheezes appreciated, good cap refill Abdomen: Soft, nontender, nondistended, bowel sounds present Extremities: No lower extremity edema, some crusts on anterior L shin present, not erythematous, no discharge Neuro: Alert, not oriented at all, not responding to commands consistently  Brief Hospital Course:  Patient presented to ED with confusion and increased urinary frequency.  Labs significant for elevated BUN and creatinine.  Blood cultures negative.  Urine culture showed <10,000 CFU.  Patient afebrile with new onset cough.  Patient tested positive for Covid.  He initially did not require any oxygen but throughout the first night of admission O2 sats dropped to mid 80s and required 2 L O2 nasal cannula.  He was started on remdesivir (02/24-28) for total course of 5 days. He received 4 doses while in hospital. Decadron was not initiated as patient had previous reaction to Prednisone.   Kidney function continued to improve and  on the day of discharge Cr 1.85.  He continued to remain confused and in collaboration with family it was determined that Mr Whidbee would benefit from being at home in familiar surroundings.  Family agreeable to provide 24 hr asssistance and work with home health.  Issues for Follow Up:  1. Follow up with PCP for repeat BMP 2. Monitor kidney function 3. Consider appointment with geriatric clinic 4. Patient most likely has an underlying mild dementia that was previously unobserved.  Consider MoCA test, although may be best to wait after acute delirium has subsided. 5. Will need 52more dose of Remdesivir with COVID home monitoring on 02/28.    Significant Procedures: None  Significant Labs and Imaging:  Recent Labs  Lab 09/06/19 0417 09/07/19 0448 09/08/19 0131  WBC 3.0* 3.4* 4.4  HGB 14.2 15.2 14.8  HCT 44.0 47.7 46.9  PLT 76* 92* 119*   Recent Labs  Lab 09/05/19 1137 09/05/19 1137 09/06/19 0417 09/06/19 0417 09/07/19 0448 09/08/19 0131  NA 139  --  144  --  147* 148*  K 4.2   < > 4.5   < > 4.1 4.3  CL 99  --  103  --  107 107  CO2 27  --  29  --  29 27  GLUCOSE 141*  --  126*  --  136* 137*  BUN 49*  --  45*  --  40* 43*  CREATININE 2.81*  --  2.36*  --  1.99* 1.85*  CALCIUM 8.6*  --  8.6*  --  8.8* 9.0  ALKPHOS 81  --  75  --  80 77  AST 58*  --  58*  --  64* 59*  ALT 25  --  23  --  25 26  ALBUMIN 2.9*  --  2.7*  --  2.7* 2.7*   < > = values in this interval not displayed.    No results found.  Results/Tests Pending at Time of Discharge: None  Discharge Medications:  Allergies as of 09/08/2019      Reactions   Gabapentin Other (See Comments)   Confusion and caused falls   Eliquis [apixaban] Swelling, Other (See Comments)   Causes swollen ankles and blot clot in eyes   Prednisone Other (See Comments)   "Made me not feel right when I took it"   Rosuvastatin Other (See Comments)   "Makes me ache"      Medication List    STOP taking these medications    cephALEXin 500 MG capsule Commonly known as: KEFLEX     TAKE these medications   acetaminophen 325 MG tablet Commonly known as: TYLENOL Take 325-650 mg by mouth every 6 (six) hours as needed for mild pain or headache.   famotidine 20 MG tablet Commonly known as: PEPCID Take 1 tablet (20 mg total) by mouth 2 (two) times daily.   ferrous sulfate 325 (65 FE) MG tablet Take 1 tablet (325 mg total) by mouth 2 (two) times daily with a meal.   fluticasone 50 MCG/ACT nasal spray Commonly known as: FLONASE PLACE 2 SPRAYS INTO BOTH NOSTRILS DAILY AS NEEDED FOR ALLERGIES OR RHINITIS. What changed:   how much to take  when to take this   Klor-Con M20 20 MEQ tablet Generic drug: potassium chloride SA TAKE 2 TABLETS BY MOUTH DAILY What changed: how much to take   loratadine 10 MG tablet Commonly known as: CLARITIN Take 10 mg by mouth daily as needed for allergies.   methocarbamol 500 MG tablet Commonly known as: ROBAXIN Take 500 mg by mouth at bedtime.   metoprolol tartrate 25 MG tablet Commonly known as: LOPRESSOR Take 0.5 tablets (12.5 mg total) by mouth 2 (two) times daily.   nitroGLYCERIN 0.4 MG SL tablet Commonly known as: NITROSTAT Place 0.4 mg under the tongue every 5 (five) minutes as needed for chest pain.   pantoprazole 40 MG tablet Commonly known as: PROTONIX TAKE 1 TABLET BY MOUTH TWICE A DAY What changed: when to take this   pravastatin 20 MG tablet Commonly known as: PRAVACHOL Take 1 tablet (20 mg total) by mouth daily at 6 PM.   pregabalin 75 MG capsule Commonly known as: LYRICA Take 75 mg by mouth 2 (two) times daily.   Rivaroxaban 15 MG Tabs tablet Commonly known as: XARELTO Take 1 tablet (15 mg total) by mouth daily with supper.   senna 8.6 MG Tabs tablet Commonly known as: SENOKOT Take 1 tablet (8.6 mg total) by mouth daily as needed for mild constipation. What changed: when to take this   Systane Ultra PF 0.4-0.3 % Soln Generic drug:  Polyethyl Glyc-Propyl Glyc PF Place 1-2 drops into both eyes every morning.   tamsulosin 0.4 MG Caps capsule Commonly known as: FLOMAX Take 1 capsule (0.4 mg total) by mouth daily after supper. To help urine stream What changed: when to take this   torsemide 20 MG tablet Commonly known as: DEMADEX Take 1 tablet by mouth daily, may take 1 extra tablet daily only as needed for swelling or weight gain of 3 lbs in one day or 5 lbs in a few days What changed:   how much  to take  how to take this  when to take this  additional instructions   traMADol 50 MG tablet Commonly known as: ULTRAM Take 50 mg by mouth daily as needed (for pain).   urea 10 % cream Commonly known as: CARMOL Apply topically as needed. What changed:   how much to take  reasons to take this       Discharge Instructions: Please refer to Patient Instructions section of EMR for full details.  Patient was counseled important signs and symptoms that should prompt return to medical care, changes in medications, dietary instructions, activity restrictions, and follow up appointments.   Follow-Up Appointments: [place] Follow up.   Why: For home oxygen Contact information: [place] [address] [phone]        Leeanne Rio, MD. Schedule an appointment as soon as possible for a visit in 1 week(s).   Specialty: Family Medicine Contact information: [place] [address] [phone]        Burnell Blanks, MD .   Specialty: Cardiology Contact information: [place]. [address] [place] [address] [phone]           Carollee Leitz, MD 09/09/2019, 9:16 AM PGY-1, [facility] Family Medicine  Resident Addendum I have separately seen and examined the patient.  I have discussed the findings and exam with the resident and agree with the above note.  I helped develop the management plan  that is described in the resident's note and I agree with the content.  Changes have been made in BLUE.    Addison Naegeli, MD PGY-2 [facility] residency program

## 2019-09-06 NOTE — Telephone Encounter (Signed)
[place] [phone]

## 2019-09-06 NOTE — Progress Notes (Signed)
Family Medicine Teaching Service Daily Progress Note Intern Pager: [phone]  Patient name: [name] Medical record number: [id number] Date of birth: Jun 20, 1924 Age: 86 y.o. Gender: male  Primary Care Provider: Leeanne Rio, MD Consultants: None Code Status: Full  [name]:  02/24-admitted  Assessment and Plan: [name] is a 86 y.o. male presenting with new cough and confusion who is positive for COVID. PMH is significant for HTN, DVT, CVA, Combined systolic and diastolic heart failure, Chronic ITP,CKD,    COVID infection Continues to be afebrile without antipyretic use.  Documented saturations 82-88% on room air.  Per nursing staff this is error patient is currently on 2 L oxygen nasal cannula with oxygen saturations 90 to 92%.  On remdesivir.  Blood cultures negative after 1 growth.   -Continue remdesivir (02/24-03/01) -Continuous cardiac and pulse ox monitoring -Vital signs per unit -Maintain oxygen saturation greater than 94% -Monitor respiratory status -PT/OT following -Out of bed with assistance -CBC in a.m.  Spoke with daughter and this morning.  She reports that patient "was not mean to their neighbor" when taking prednisone.  She reports that she does not want her father in hospital too long as she feels that they can take good care of him at home and she does not want him to go downhill.  She feels that he would do better if family was around.   HFrEF Chronic.  Stable.  No recorded urine output overnight.  Patient appears euvolemic. -Continue to hold torsemide -Continue metoprolol 12.5 mg twice daily -Strict I's and O's -Daily weights -Monitor fluid status  AKI on CKD Continues to improve, creatinine trending downward.   -Continue oral hydration -Hold torsemide in setting of AKI -Hold potassium, 4.1 today -Avoid nephrotoxic agents -BMP in a.m.  Confusion-likely secondary to advanced age/dementia RN  reports patient slept through 2 to 3 hours overnight and has been somewhat restless.  Unable to complete sentences.  Patient continues to be afebrile, urine culture <10,000 CFU, blood cultures show no growth after 1 day and no increase in white count.  On exam patient continues to be pleasantly confused.  Oriented to place, self and month.  At baseline he does not know the year. -continue to monitor mentation  Increased urination  incontinence Urinalysis negative.  Urine culture<10,000 CFU's.  Patient afebrile.  No CVA tenderness.  Less likely UTI causing increased urination and incontinence. -Continue to monitor    HTN Chronic.  Stable.  Normotensive overnight.  Denies any chest pain, headaches or dizziness. -Continue metoprolol 12.5 mg twice daily -Continue to hold torsemide in the setting of AKI -Continue to hold potassium  Afib Chronic.  Stable.  Home medication include Xarelto metoprolol -Continue Xarelto 15 mg daily -Continue metoprolol 12.5 mg twice daily  Chronic Back Pain Chronic.  Stable.  Home medications ibuprofen 200 mg daily, Robaxin 500 mg nightly and Lyrica 75 mg twice daily -Avoid NSAIDs -Continue Robaxin 500 mg nightly -Continue Lyrica 75 mg twice daily -PT/OT eval  HLD Chronic.  Stable.  Home medication pravastatin -Continue pravastatin 20 mg daily  H/O Rt leg DVT Chronic.  Stable.  No leg pain or swelling.  Denies any shortness of breath.  Home meds include Xarelto -Continue Xarelto 50 mg daily -PT/OT following -Out of bed with assistance  CAD s/p stent placement Chronic.  Stable.  No reports of chest pain or shortness of breath.  Home medications include metoprolol and nitroglycerin as needed. -Continue metoprolol 12.5 mg  twice daily -Continuous cardiac monitoring  BPH Chronic.  Stable.  No difficulties urinating.  Reports no incontinence. Urine culture <10,000 CFU.  Patient remains afebrile.  Incontinence less likely due to a UTI. -Continue Flomax  0.4 mg daily -Follow-up urology outpatient -Continue to monitor  Chronic ITP Chronic.  Stable.  Platelets improving.  Hemoglobin stable.  No source of bleeding -Continue to monitor -CBC in a.m.  GERD Chronic.  Stable.  Home meds Pepcid and Protonix.  Denies any abdominal pain. -Continue Pepcid 20 mg daily -Continue Protonix 40 mg twice daily  FEN/GI:  -Heart Healthy Prophylaxis:  -Xarelto  Disposition: Home when medically stable  Subjective:  No acute events overnight.  Nursing staff reports patient slept for about 3 hours last night, has been restlesss and refused medications overnight.  Patient denies any chest pain, shortness of breath, abdominal pain.  Does report back pain that is not new.  Denies any difficulty voiding.  Objective: Temp:  [97.8 F (36.6 C)-98.4 F (36.9 C)] 98.2 F (36.8 C) (02/25 1200) Pulse Rate:  [86-107] 105 (02/25 1800) Resp:  [13-21] 16 (02/25 1800) BP: (87-127)/(54-76) 96/69 (02/25 1800) SpO2:  [84 %-100 %] 92 % (02/25 1800) Weight:  [77.1 kg-77.3 kg] 77.3 kg (02/25 0559) Physical Exam: General: Pleasant 86 year old male in no acute distress Cardiovascular: Irregular rate and rhythm, no gallops or murmurs appreciated, distal pulses present Respiratory: Chest clear to auscultation bilaterally, no crackles or wheezes appreciated, good cap refill Abdomen: Soft, nontender, nondistended, bowel sounds present Extremities: No lower extremity edema Neuro: Alert and oriented to person, place and month.  Obeying commands,   Laboratory: Recent Labs  Lab 09/05/19 1137 09/06/19 0417  WBC 3.5* 3.0*  HGB 14.4 14.2  HCT 46.2 44.0  PLT 75* 76*   Recent Labs  Lab 09/05/19 1137 09/06/19 0417  NA 139 144  K 4.2 4.5  CL 99 103  CO2 27 29  BUN 49* 45*  CREATININE 2.81* 2.36*  CALCIUM 8.6* 8.6*  PROT 6.2* 5.8*  BILITOT 1.1 1.0  ALKPHOS 81 75  ALT 25 23  AST 58* 58*  GLUCOSE 141* 126*    Imaging/Diagnostic Tests:   Carollee Leitz,  MD 09/06/2019, 6:03 PM PGY-1, [place] Intern pager: [phone], text pages welcome

## 2019-09-07 DIAGNOSIS — I4819 Other persistent atrial fibrillation: Secondary | ICD-10-CM

## 2019-09-07 LAB — COMPREHENSIVE METABOLIC PANEL
ALT: 25 U/L (ref 0–44)
AST: 64 U/L — ABNORMAL HIGH (ref 15–41)
Albumin: 2.7 g/dL — ABNORMAL LOW (ref 3.5–5.0)
Alkaline Phosphatase: 80 U/L (ref 38–126)
Anion gap: 11 (ref 5–15)
BUN: 40 mg/dL — ABNORMAL HIGH (ref 8–23)
CO2: 29 mmol/L (ref 22–32)
Calcium: 8.8 mg/dL — ABNORMAL LOW (ref 8.9–10.3)
Chloride: 107 mmol/L (ref 98–111)
Creatinine, Ser: 1.99 mg/dL — ABNORMAL HIGH (ref 0.61–1.24)
GFR calc Af Amer: 33 mL/min — ABNORMAL LOW (ref >60)
GFR calc non Af Amer: 28 mL/min — ABNORMAL LOW (ref >60)
Glucose, Bld: 136 mg/dL — ABNORMAL HIGH (ref 70–99)
Potassium: 4.1 mmol/L (ref 3.5–5.1)
Sodium: 147 mmol/L — ABNORMAL HIGH (ref 135–145)
Total Bilirubin: 1.8 mg/dL — ABNORMAL HIGH (ref 0.3–1.2)
Total Protein: 6.2 g/dL — ABNORMAL LOW (ref 6.5–8.1)

## 2019-09-07 LAB — CBC WITH DIFFERENTIAL/PLATELET
Abs Immature Granulocytes: 0.03 10*3/uL (ref 0.00–0.07)
Basophils Absolute: 0 10*3/uL (ref 0.0–0.1)
Basophils Relative: 1 %
Eosinophils Absolute: 0 10*3/uL (ref 0.0–0.5)
Eosinophils Relative: 0 %
HCT: 47.7 % (ref 39.0–52.0)
Hemoglobin: 15.2 g/dL (ref 13.0–17.0)
Immature Granulocytes: 1 %
Lymphocytes Relative: 15 %
Lymphs Abs: 0.5 10*3/uL — ABNORMAL LOW (ref 0.7–4.0)
MCH: 27.5 pg (ref 26.0–34.0)
MCHC: 31.9 g/dL (ref 30.0–36.0)
MCV: 86.4 fL (ref 80.0–100.0)
Monocytes Absolute: 0.3 10*3/uL (ref 0.1–1.0)
Monocytes Relative: 8 %
Neutro Abs: 2.5 10*3/uL (ref 1.7–7.7)
Neutrophils Relative %: 75 %
Platelets: 92 10*3/uL — ABNORMAL LOW (ref 150–400)
RBC: 5.52 MIL/uL (ref 4.22–5.81)
RDW: 14.3 % (ref 11.5–15.5)
WBC: 3.4 10*3/uL — ABNORMAL LOW (ref 4.0–10.5)
nRBC: 0 % (ref 0.0–0.2)

## 2019-09-07 LAB — D-DIMER, QUANTITATIVE: D-Dimer, Quant: 0.93 ug/mL-FEU — ABNORMAL HIGH (ref 0.00–0.50)

## 2019-09-07 LAB — C-REACTIVE PROTEIN: CRP: 12.4 mg/dL — ABNORMAL HIGH (ref <1.0)

## 2019-09-07 LAB — FERRITIN: Ferritin: 505 ng/mL — ABNORMAL HIGH (ref 24–336)

## 2019-09-07 MED ORDER — QUETIAPINE FUMARATE 25 MG PO TABS: 25.0000 mg | ORAL_TABLET | Freq: Every day | ORAL | Status: DC

## 2019-09-07 NOTE — Progress Notes (Signed)
Patient sitting on room air O2 sats  91%, with ambulation on room air O2 sats 86%. Patient O2 Sats ambulating with 2L O2 at 91%.

## 2019-09-07 NOTE — Plan of Care (Signed)

## 2019-09-08 DIAGNOSIS — I10 Essential (primary) hypertension: Secondary | ICD-10-CM

## 2019-09-08 LAB — COMPREHENSIVE METABOLIC PANEL
ALT: 26 U/L (ref 0–44)
AST: 59 U/L — ABNORMAL HIGH (ref 15–41)
Albumin: 2.7 g/dL — ABNORMAL LOW (ref 3.5–5.0)
Alkaline Phosphatase: 77 U/L (ref 38–126)
Anion gap: 14 (ref 5–15)
BUN: 43 mg/dL — ABNORMAL HIGH (ref 8–23)
CO2: 27 mmol/L (ref 22–32)
Calcium: 9 mg/dL (ref 8.9–10.3)
Chloride: 107 mmol/L (ref 98–111)
Creatinine, Ser: 1.85 mg/dL — ABNORMAL HIGH (ref 0.61–1.24)
GFR calc Af Amer: 36 mL/min — ABNORMAL LOW (ref >60)
GFR calc non Af Amer: 31 mL/min — ABNORMAL LOW (ref >60)
Glucose, Bld: 137 mg/dL — ABNORMAL HIGH (ref 70–99)
Potassium: 4.3 mmol/L (ref 3.5–5.1)
Sodium: 148 mmol/L — ABNORMAL HIGH (ref 135–145)
Total Bilirubin: 1.7 mg/dL — ABNORMAL HIGH (ref 0.3–1.2)
Total Protein: 6.2 g/dL — ABNORMAL LOW (ref 6.5–8.1)

## 2019-09-08 LAB — CBC WITH DIFFERENTIAL/PLATELET
Abs Immature Granulocytes: 0.02 10*3/uL (ref 0.00–0.07)
Basophils Absolute: 0 10*3/uL (ref 0.0–0.1)
Basophils Relative: 0 %
Eosinophils Absolute: 0 10*3/uL (ref 0.0–0.5)
Eosinophils Relative: 0 %
HCT: 46.9 % (ref 39.0–52.0)
Hemoglobin: 14.8 g/dL (ref 13.0–17.0)
Immature Granulocytes: 1 %
Lymphocytes Relative: 13 %
Lymphs Abs: 0.6 10*3/uL — ABNORMAL LOW (ref 0.7–4.0)
MCH: 27.7 pg (ref 26.0–34.0)
MCHC: 31.6 g/dL (ref 30.0–36.0)
MCV: 87.7 fL (ref 80.0–100.0)
Monocytes Absolute: 0.3 10*3/uL (ref 0.1–1.0)
Monocytes Relative: 7 %
Neutro Abs: 3.5 10*3/uL (ref 1.7–7.7)
Neutrophils Relative %: 79 %
Platelets: 119 10*3/uL — ABNORMAL LOW (ref 150–400)
RBC: 5.35 MIL/uL (ref 4.22–5.81)
RDW: 14.5 % (ref 11.5–15.5)
WBC: 4.4 10*3/uL (ref 4.0–10.5)
nRBC: 0 % (ref 0.0–0.2)

## 2019-09-08 LAB — FERRITIN: Ferritin: 350 ng/mL — ABNORMAL HIGH (ref 24–336)

## 2019-09-08 LAB — D-DIMER, QUANTITATIVE: D-Dimer, Quant: 1.13 ug/mL-FEU — ABNORMAL HIGH (ref 0.00–0.50)

## 2019-09-08 LAB — C-REACTIVE PROTEIN: CRP: 14.9 mg/dL — ABNORMAL HIGH (ref <1.0)

## 2019-09-08 NOTE — Discharge Instructions (Signed)
You are scheduled for an outpatient infusion of Remdesivir at 11:30 AM on Sunday 2/28.  Please report to [facility] at [address].  Drive to the security guard and tell them you are here for an infusion. They will direct you to the front entrance where we will come and get you.  For questions call [phone].  Thanks     10  Things You Can Do to Manage Your COVID-19 Symptoms at Home If you have possible or confirmed COVID-19: 1. Stay home from work and school. And stay away from other public places. If you must go out, avoid using any kind of public transportation, ridesharing, or taxis. 2. Monitor your symptoms carefully. If your symptoms get worse, call your healthcare provider immediately. 3. Get rest and stay hydrated. 4. If you have a medical appointment, call the healthcare provider ahead of time and tell them that you have or may have COVID-19. 5. For medical emergencies, call 911 and notify the dispatch personnel that you have or may have COVID-19. 6. Cover your cough and sneezes with a tissue or use the inside of your elbow. 7. Wash your hands often with soap and water for at least 20 seconds or clean your hands with an alcohol-based hand sanitizer that contains at least 60% alcohol. 8. As much as possible, stay in a specific room and away from other people in your home. Also, you should use a separate bathroom, if available. If you need to be around other people in or outside of the home, wear a mask. 9. Avoid sharing personal items with other people in your household, like dishes, towels, and bedding. 10. Clean all surfaces that are touched often, like counters, tabletops, and doorknobs. Use household cleaning sprays or wipes according to the label instructions. [email or web address] 01/10/2019 This information is not intended to replace advice given to you by your health care provider. Make sure you discuss any questions you have with your health care provider. Document  Revised: 06/14/2019 Document Reviewed: 06/14/2019 Elsevier Patient Education  [place].

## 2019-09-08 NOTE — Progress Notes (Signed)
Daughter of patient, Lelon Frohlich aware of patients discharge and need for home O2 for transport. Patient dressed, IV removed and ready to go. Discharge instructions with patient.

## 2019-09-08 NOTE — Plan of Care (Signed)

## 2019-09-08 NOTE — Progress Notes (Signed)
Patient scheduled for outpatient Remdesivir infusion at 11:30 AM on Sunday 2/28.  Please advise them to report to [facility] at [address]  Drive to the security guard and tell them you are here for an infusion. They will direct you to the front entrance where we will come and get you.  For questions call [phone].  Thanks

## 2019-09-08 NOTE — TOC Transition Note (Addendum)
Transition of Care [facility]) - CM/SW Discharge Note   Patient Details  Name: SEBASTIANO LUECKE MRN: [id number] Date of Birth: 07/14/1923  Transition of Care [facility]) CM/SW Contact:  Carles Collet, RN Phone Number: 09/08/2019, 9:07 AM   Clinical Narrative:    Damaris Schooner w daughter Lelon Frohlich. Patient from home, lives alone, but family lives on same land and they care for him everyday, cooking meals, dressing etc. They are wanting patient to return home ASAP.  Oxygen has been ordered through [facility] to be delivered to the house, Lelon Frohlich will bring transport tanks to the hospital when she comes to get him. She is declining HH services, and states he has no needs for DME.  11:00- notified by Huey Romans that O2 has been delivered to the house     Final next level of care: Home/Self Care Barriers to Discharge: No Barriers Identified   Patient Goals and CMS Choice Patient states their goals for this hospitalization and ongoing recovery are:: to return home CMS Medicare.gov Compare Post Acute Care list provided to:: Other (Comment Required) Choice offered to / list presented to : Adult Children  Discharge Placement                       Discharge Plan and Services                DME Arranged: Oxygen DME Agency: [place]                  Social Determinants of Health (SDOH) Interventions     Readmission Risk Interventions No flowsheet data found.

## 2019-09-08 NOTE — Progress Notes (Signed)
Spoke with daughter, Daxtyn Rottenberg, patient has 24 hour supervision at home by his four children (they coordinate). They will accept [facility] PT from [facility] with [facility], who they have worked with for years. HHOT they will decline because it is too difficult on the patient (per daughter). They will decline SNF placement and state they can provide 24 hour supervision for him. Since patient is overall stable from a COVID-19 standpoint, but his mentation is declining in the hospital, he would be best served by being in a familiar place with familiar people who will help restore his mentation. The FM team is doing our best to arrange this today if possible. Pending Home O2 and PT/OT recommendations.  Gladys Damme, MD [facility] [facility], PGY-1

## 2019-09-08 NOTE — Progress Notes (Signed)
OT Cancellation Note  Patient Details Name: [name] MRN: [id number] DOB: [birth year]   Cancelled Treatment:    Reason Eval/Treat Not Completed: Medical issues which prohibited therapy;Other (comment); pt in notable discomfort, tachy HR into the 150s, resistive to attempts to assist with mobility and with minimal responses to questions, not following commands. RN made aware, discussed with RN potential need for PTAR for safe transport home. Will follow up as able for OT eval.  Lou Cal, OT Supplemental Rehabilitation Services Pager [phone] Office [phone]   Raymondo Band 09/08/2019, 1:34 PM

## 2019-09-08 NOTE — Progress Notes (Signed)
PT Cancellation Note  Patient Details Name: [name] MRN: [id number] DOB: [birth year]   Cancelled Treatment:    Reason Eval/Treat Not Completed: Medical issues which prohibited therapy Pt tachy to 150's, actively resisting all movements or attempt to transfer. Not following any commands. RN aware.    Wyona Almas, PT, DPT Acute Rehabilitation Services Pager [phone] Office [phone]    Deno Etienne 09/08/2019, 1:01 PM

## 2019-09-08 NOTE — Progress Notes (Signed)
Family Medicine Teaching Service Daily Progress Note Intern Pager: [phone]  Patient name: [name] Medical record number: [id number] Date of birth: [birth year] Age: 86 y.o. Gender: male  Primary Care Provider: Leeanne Rio, MD Consultants: None Code Status: Full  [name]:  02/24-admitted  Assessment and Plan: [name] is a 86 y.o. male presenting with new cough and confusion who is positive for COVID. PMH is significant for HTN, DVT, CVA, Combined systolic and diastolic heart failure, Chronic ITP, CKD.  COVID infection Continues to be afebrile without antipyretic use.  Documented saturations 87-91% on 2 L oxygen nasal cannula, most recently SpO2 97-99%.  Day 4 of remdesivir.  Blood cultures negative x1d. Overall patient is quite stable medically, but deteriorated cognitively and would be better served at home which would improve his orientation. Will try to get him home with O2 pending PT/OT. -Continue remdesivir (02/24-03/01) -Continuous cardiac and pulse ox monitoring -Vital signs per unit -Maintain oxygen saturation greater than 94% -Monitor respiratory status -PT/OT following -Out of bed with assistance -CBC in a.m.  HFrEF Chronic. Stable. No recorded urine output overnight, last recorded UOP 2/26 AM w/ 400cc output, expect UOP better than what is charted.  Patient appears euvolemic. -Continue to hold torsemide -Continue metoprolol 12.5 mg twice daily -Strict I's and O's -Daily weights -Monitor fluid status  AKI on CKD Continues to improve, creatinine trending downward; improved from 2.99>1.99>1.85 today.   -Continue oral hydration -Hold torsemide in setting of AKI, now resolved but decreased PO d/t cognition -Hold potassium, 4.3 today -Avoid nephrotoxic agents -BMP in a.m.  Confusion-likely secondary to advanced age/dementia Patient continues to be afebrile, urine culture <10,000 CFU, blood cultures show no  growth after 1 day and no increase in white count.  On exam patient continues to be pleasantly confused.  Oriented to place, self and month.  At baseline he does not know the year. -continue to monitor mentation  Increased urination  incontinence -stable Urinalysis negative.  Urine culture<10,000 CFU's.  Patient afebrile.  No CVA tenderness.  Less likely UTI causing increased urination and incontinence. -Continue to monitor    HTN Chronic.  Stable.  Normotensive overnight.  Denies any chest pain, headaches or dizziness. -Continue metoprolol 12.5 mg twice daily -Continue to hold torsemide in the setting of decreased PO d/t cognition -Continue to hold potassium  Afib Chronic.  Stable.  Home medications include Xarelto, metoprolol -Continue Xarelto 15 mg daily -Continue metoprolol 12.5 mg twice daily  Chronic Back Pain Chronic.  Stable.  Home medications ibuprofen 200 mg daily, Robaxin 500 mg nightly and Lyrica 75 mg twice daily -Avoid NSAIDs -Continue Robaxin 500 mg nightly -Continue Lyrica 75 mg twice daily -PT/OT eval  HLD Chronic.  Stable.  Home medication pravastatin -Continue pravastatin 20 mg daily  H/O Rt leg DVT Chronic.  Stable.  No leg pain or swelling.  Denies any shortness of breath.  Home meds include Xarelto -Continue Xarelto 50 mg daily -PT/OT following -Out of bed with assistance  CAD s/p stent placement Chronic.  Stable.  No reports of chest pain or shortness of breath.  Home medications include metoprolol and nitroglycerin as needed. -Continue metoprolol 12.5 mg twice daily -Continuous cardiac monitoring  BPH Chronic.  Stable.  No difficulties urinating.  Reports no incontinence. Urine culture <10,000 CFU.  Patient remains afebrile.  Incontinence less likely due to a UTI. -Continue Flomax 0.4 mg daily -Follow-up urology outpatient -Continue to monitor  Chronic ITP Chronic.  Stable.  Platelets improving today: 119 from 92.  Hemoglobin stable.  No  source of bleeding -Continue to monitor -CBC in a.m.  GERD Chronic.  Stable.  Home meds Pepcid and Protonix.  Denies any abdominal pain. -Continue Pepcid 20 mg daily -Continue Protonix 40 mg twice daily  FEN/GI:  -Heart Healthy Prophylaxis:  -Xarelto  Disposition: Home when medically stable  Subjective:  Patient's baseline is alert and oriented to self, place, and month, but not year. Today patient is alert, but not oriented, could not follow commands or tell me his name when asked. However, he intermittently responded appropriately. For example, when I asked if I could examine his legs, he responded "yes, sure," but did not respond in intelligble language to any other requests.  Objective: Temp:  [98.5 F (36.9 C)-98.6 F (37 C)] 98.6 F (37 C) (02/26 2017) Pulse Rate:  [89-119] 99 (02/27 0405) Resp:  [17-20] 19 (02/27 0416) BP: (94-113)/(65-69) 94/65 (02/27 0400) SpO2:  [86 %-96 %] 91 % (02/27 0416) Weight:  [77.1 kg] 77.1 kg (02/27 0500) Physical Exam: General: Pleasant 86 year old male in no acute distress Cardiovascular: Irregular rate and rhythm, no gallops or murmurs appreciated, distal pulses present Respiratory: Chest clear to auscultation bilaterally, no crackles or wheezes appreciated, good cap refill Abdomen: Soft, nontender, nondistended, bowel sounds present Extremities: No lower extremity edema, some crusts on anterior L shin present, not erythematous, no discharge Neuro: Alert, not oriented at all, not responding to commands consistently   Laboratory: Recent Labs  Lab 09/06/19 0417 09/07/19 0448 09/08/19 0131  WBC 3.0* 3.4* 4.4  HGB 14.2 15.2 14.8  HCT 44.0 47.7 46.9  PLT 76* 92* 119*   Recent Labs  Lab 09/06/19 0417 09/07/19 0448 09/08/19 0131  NA 144 147* 148*  K 4.5 4.1 4.3  CL 103 107 107  CO2 29 29 27   BUN 45* 40* 43*  CREATININE 2.36* 1.99* 1.85*  CALCIUM 8.6* 8.8* 9.0  PROT 5.8* 6.2* 6.2*  BILITOT 1.0 1.8* 1.7*  ALKPHOS 75 80 77   ALT 23 25 26   AST 58* 64* 59*  GLUCOSE 126* 136* 137*    Imaging/Diagnostic Tests:   Alan Damme, MD 09/08/2019, 7:45 AM PGY-1, [place] Intern pager: [phone], text pages welcome

## 2019-09-09 ENCOUNTER — Other Ambulatory Visit: Payer: Self-pay | Admitting: Family Medicine

## 2019-09-09 ENCOUNTER — Ambulatory Visit (HOSPITAL_COMMUNITY)
Admit: 2019-09-09 | Discharge: 2019-09-09 | Disposition: A | Discharge status: Home / Self Care | Payer: Medicare PPO | Attending: Pulmonary Disease | Admitting: Pulmonary Disease

## 2019-09-09 ENCOUNTER — Telehealth: Payer: Self-pay | Admitting: Family Medicine

## 2019-09-09 DIAGNOSIS — U071 COVID-19: Secondary | ICD-10-CM

## 2019-09-09 NOTE — Telephone Encounter (Signed)
Received after hours call from patient's daughter who is saying that she was unaware of him needing 5th dose of remdesivir at [facility] clinic today.  She states patient is in no condition to ride in a car to the clinic.  She was wondering if there is any options to get it at home.  I confirmed with Maylon Cos at the infusion center that they only do it in the clinic and not at home.  I relayed this information to the daughter.  Of note, the daughter is saying that nobody told her about this appointment.  I looked through the patient's discharge instructions and it clearly states that he has an appointment today at 11:30 AM at [facility].  She states that this is not on her instructions.  I also told the patient that I was in the room when she was on the phone with Dr. Garlan Fillers yesterday when they were discussing discharge and that it was our opinion he would probably do better with another night at the hospital, but patient's daughter was insistent that he go home today, even after discovering that he would likely need to go home in the ambulance.  Patient daughter insisted yesterday that he ride in the car with her instead of the ambulance, which he did apparently.  She denies this conversation occurring.  She states that if she had known he needed another dose of medication, she probably would have had him stay the night in the hospital.  She also then states that he was getting more delirious while in the hospital and that his mental status was getting worse which is why she was so insistent on taking him home.  She reports his mental status has improved somewhat since coming home but is "nowhere near his baseline"  I informed the patient's daughter that the best option would be for him to go to the infusion center, but if she feels he cannot do that, it would be acceptable for him to miss this last dose of remdesivir.  It is not absolutely necessary for him to get all 5 doses.  Patient's  respiratory symptoms were mild to moderate to begin with, and the only reason he was admitted in the first place was because of an AKI and the confusion, neither of which is likely to be helped by 5th dose of remdesivir  I will relay this information to patient's PCP, Dr. Ardelia Mems.

## 2019-09-10 DIAGNOSIS — U071 COVID-19: Secondary | ICD-10-CM | POA: Diagnosis not present

## 2019-09-10 LAB — CULTURE, BLOOD (ROUTINE X 2)
Culture: NO GROWTH
Culture: NO GROWTH
Special Requests: ADEQUATE

## 2019-09-11 ENCOUNTER — Telehealth (INDEPENDENT_AMBULATORY_CARE_PROVIDER_SITE_OTHER): Payer: Medicare PPO | Admitting: Family Medicine

## 2019-09-11 ENCOUNTER — Inpatient Hospital Stay (HOSPITAL_COMMUNITY): Payer: Medicare PPO

## 2019-09-11 ENCOUNTER — Emergency Department (HOSPITAL_COMMUNITY): Payer: Medicare PPO

## 2019-09-11 ENCOUNTER — Other Ambulatory Visit: Payer: Self-pay

## 2019-09-11 ENCOUNTER — Inpatient Hospital Stay (HOSPITAL_COMMUNITY)
Admission: EM | Admit: 2019-09-11 | Discharge: 2019-10-11 | DRG: 177 | Disposition: E | Payer: Medicare PPO | Attending: Family Medicine | Admitting: Family Medicine

## 2019-09-11 ENCOUNTER — Encounter (HOSPITAL_COMMUNITY): Payer: Self-pay | Admitting: Emergency Medicine

## 2019-09-11 DIAGNOSIS — R410 Disorientation, unspecified: Secondary | ICD-10-CM | POA: Diagnosis not present

## 2019-09-11 DIAGNOSIS — G8929 Other chronic pain: Secondary | ICD-10-CM | POA: Diagnosis not present

## 2019-09-11 DIAGNOSIS — F05 Delirium due to known physiological condition: Secondary | ICD-10-CM | POA: Diagnosis present

## 2019-09-11 DIAGNOSIS — N184 Chronic kidney disease, stage 4 (severe): Secondary | ICD-10-CM | POA: Diagnosis not present

## 2019-09-11 DIAGNOSIS — I255 Ischemic cardiomyopathy: Secondary | ICD-10-CM | POA: Diagnosis present

## 2019-09-11 DIAGNOSIS — I4891 Unspecified atrial fibrillation: Secondary | ICD-10-CM

## 2019-09-11 DIAGNOSIS — I4821 Permanent atrial fibrillation: Secondary | ICD-10-CM | POA: Diagnosis present

## 2019-09-11 DIAGNOSIS — R778 Other specified abnormalities of plasma proteins: Secondary | ICD-10-CM | POA: Diagnosis not present

## 2019-09-11 DIAGNOSIS — R627 Adult failure to thrive: Secondary | ICD-10-CM | POA: Diagnosis not present

## 2019-09-11 DIAGNOSIS — R404 Transient alteration of awareness: Secondary | ICD-10-CM | POA: Diagnosis not present

## 2019-09-11 DIAGNOSIS — I5042 Chronic combined systolic (congestive) and diastolic (congestive) heart failure: Secondary | ICD-10-CM | POA: Diagnosis not present

## 2019-09-11 DIAGNOSIS — R451 Restlessness and agitation: Secondary | ICD-10-CM | POA: Diagnosis not present

## 2019-09-11 DIAGNOSIS — R0609 Other forms of dyspnea: Secondary | ICD-10-CM | POA: Diagnosis not present

## 2019-09-11 DIAGNOSIS — D693 Immune thrombocytopenic purpura: Secondary | ICD-10-CM | POA: Diagnosis not present

## 2019-09-11 DIAGNOSIS — Z515 Encounter for palliative care: Secondary | ICD-10-CM | POA: Diagnosis not present

## 2019-09-11 DIAGNOSIS — J159 Unspecified bacterial pneumonia: Secondary | ICD-10-CM | POA: Diagnosis present

## 2019-09-11 DIAGNOSIS — I2510 Atherosclerotic heart disease of native coronary artery without angina pectoris: Secondary | ICD-10-CM | POA: Diagnosis present

## 2019-09-11 DIAGNOSIS — I130 Hypertensive heart and chronic kidney disease with heart failure and stage 1 through stage 4 chronic kidney disease, or unspecified chronic kidney disease: Secondary | ICD-10-CM | POA: Diagnosis present

## 2019-09-11 DIAGNOSIS — I998 Other disorder of circulatory system: Secondary | ICD-10-CM | POA: Diagnosis not present

## 2019-09-11 DIAGNOSIS — J189 Pneumonia, unspecified organism: Secondary | ICD-10-CM | POA: Diagnosis not present

## 2019-09-11 DIAGNOSIS — R0603 Acute respiratory distress: Secondary | ICD-10-CM | POA: Diagnosis not present

## 2019-09-11 DIAGNOSIS — N179 Acute kidney failure, unspecified: Secondary | ICD-10-CM | POA: Diagnosis present

## 2019-09-11 DIAGNOSIS — Z955 Presence of coronary angioplasty implant and graft: Secondary | ICD-10-CM

## 2019-09-11 DIAGNOSIS — Z79891 Long term (current) use of opiate analgesic: Secondary | ICD-10-CM

## 2019-09-11 DIAGNOSIS — R0600 Dyspnea, unspecified: Secondary | ICD-10-CM

## 2019-09-11 DIAGNOSIS — N401 Enlarged prostate with lower urinary tract symptoms: Secondary | ICD-10-CM | POA: Diagnosis present

## 2019-09-11 DIAGNOSIS — Z7189 Other specified counseling: Secondary | ICD-10-CM | POA: Diagnosis not present

## 2019-09-11 DIAGNOSIS — J309 Allergic rhinitis, unspecified: Secondary | ICD-10-CM | POA: Diagnosis present

## 2019-09-11 DIAGNOSIS — G9340 Encephalopathy, unspecified: Secondary | ICD-10-CM | POA: Diagnosis not present

## 2019-09-11 DIAGNOSIS — Z8673 Personal history of transient ischemic attack (TIA), and cerebral infarction without residual deficits: Secondary | ICD-10-CM

## 2019-09-11 DIAGNOSIS — E870 Hyperosmolality and hypernatremia: Secondary | ICD-10-CM | POA: Diagnosis not present

## 2019-09-11 DIAGNOSIS — Z8672 Personal history of thrombophlebitis: Secondary | ICD-10-CM

## 2019-09-11 DIAGNOSIS — I252 Old myocardial infarction: Secondary | ICD-10-CM | POA: Diagnosis not present

## 2019-09-11 DIAGNOSIS — D509 Iron deficiency anemia, unspecified: Secondary | ICD-10-CM | POA: Diagnosis present

## 2019-09-11 DIAGNOSIS — Z79899 Other long term (current) drug therapy: Secondary | ICD-10-CM

## 2019-09-11 DIAGNOSIS — Z789 Other specified health status: Secondary | ICD-10-CM

## 2019-09-11 DIAGNOSIS — R52 Pain, unspecified: Secondary | ICD-10-CM | POA: Diagnosis not present

## 2019-09-11 DIAGNOSIS — Z66 Do not resuscitate: Secondary | ICD-10-CM | POA: Diagnosis not present

## 2019-09-11 DIAGNOSIS — Z888 Allergy status to other drugs, medicaments and biological substances status: Secondary | ICD-10-CM

## 2019-09-11 DIAGNOSIS — Z96653 Presence of artificial knee joint, bilateral: Secondary | ICD-10-CM | POA: Diagnosis present

## 2019-09-11 DIAGNOSIS — E785 Hyperlipidemia, unspecified: Secondary | ICD-10-CM | POA: Diagnosis present

## 2019-09-11 DIAGNOSIS — I5020 Unspecified systolic (congestive) heart failure: Secondary | ICD-10-CM | POA: Diagnosis not present

## 2019-09-11 DIAGNOSIS — J1282 Pneumonia due to coronavirus disease 2019: Secondary | ICD-10-CM | POA: Diagnosis present

## 2019-09-11 DIAGNOSIS — U071 COVID-19: Principal | ICD-10-CM | POA: Diagnosis present

## 2019-09-11 DIAGNOSIS — E860 Dehydration: Secondary | ICD-10-CM

## 2019-09-11 DIAGNOSIS — Z7901 Long term (current) use of anticoagulants: Secondary | ICD-10-CM

## 2019-09-11 DIAGNOSIS — K219 Gastro-esophageal reflux disease without esophagitis: Secondary | ICD-10-CM | POA: Diagnosis present

## 2019-09-11 DIAGNOSIS — I959 Hypotension, unspecified: Secondary | ICD-10-CM | POA: Diagnosis not present

## 2019-09-11 DIAGNOSIS — R918 Other nonspecific abnormal finding of lung field: Secondary | ICD-10-CM | POA: Diagnosis not present

## 2019-09-11 DIAGNOSIS — I4819 Other persistent atrial fibrillation: Secondary | ICD-10-CM | POA: Diagnosis present

## 2019-09-11 DIAGNOSIS — M549 Dorsalgia, unspecified: Secondary | ICD-10-CM | POA: Diagnosis not present

## 2019-09-11 DIAGNOSIS — G9389 Other specified disorders of brain: Secondary | ICD-10-CM

## 2019-09-11 DIAGNOSIS — T17908A Unspecified foreign body in respiratory tract, part unspecified causing other injury, initial encounter: Secondary | ICD-10-CM | POA: Diagnosis present

## 2019-09-11 DIAGNOSIS — I739 Peripheral vascular disease, unspecified: Secondary | ICD-10-CM | POA: Diagnosis present

## 2019-09-11 DIAGNOSIS — E861 Hypovolemia: Secondary | ICD-10-CM | POA: Diagnosis present

## 2019-09-11 DIAGNOSIS — Z86718 Personal history of other venous thrombosis and embolism: Secondary | ICD-10-CM

## 2019-09-11 DIAGNOSIS — R0902 Hypoxemia: Secondary | ICD-10-CM | POA: Diagnosis not present

## 2019-09-11 HISTORY — DX: Gastritis, unspecified, without bleeding: K29.70

## 2019-09-11 HISTORY — DX: Anemia, unspecified: D64.9

## 2019-09-11 HISTORY — DX: Acute kidney failure, unspecified: N17.9

## 2019-09-11 LAB — CBC WITH DIFFERENTIAL/PLATELET
Abs Immature Granulocytes: 0.08 10*3/uL — ABNORMAL HIGH (ref 0.00–0.07)
Basophils Absolute: 0 10*3/uL (ref 0.0–0.1)
Basophils Relative: 1 %
Eosinophils Absolute: 0 10*3/uL (ref 0.0–0.5)
Eosinophils Relative: 0 %
HCT: 55.7 % — ABNORMAL HIGH (ref 39.0–52.0)
Hemoglobin: 16.7 g/dL (ref 13.0–17.0)
Immature Granulocytes: 2 %
Lymphocytes Relative: 13 %
Lymphs Abs: 0.7 10*3/uL (ref 0.7–4.0)
MCH: 27.3 pg (ref 26.0–34.0)
MCHC: 30 g/dL (ref 30.0–36.0)
MCV: 91 fL (ref 80.0–100.0)
Monocytes Absolute: 0.5 10*3/uL (ref 0.1–1.0)
Monocytes Relative: 10 %
Neutro Abs: 3.9 10*3/uL (ref 1.7–7.7)
Neutrophils Relative %: 74 %
Platelets: 190 10*3/uL (ref 150–400)
RBC: 6.12 MIL/uL — ABNORMAL HIGH (ref 4.22–5.81)
RDW: 15.1 % (ref 11.5–15.5)
WBC: 5.1 10*3/uL (ref 4.0–10.5)
nRBC: 0 % (ref 0.0–0.2)

## 2019-09-11 LAB — COMPREHENSIVE METABOLIC PANEL
ALT: 25 U/L (ref 0–44)
AST: 43 U/L — ABNORMAL HIGH (ref 15–41)
Albumin: 3 g/dL — ABNORMAL LOW (ref 3.5–5.0)
Alkaline Phosphatase: 90 U/L (ref 38–126)
Anion gap: 15 (ref 5–15)
BUN: 58 mg/dL — ABNORMAL HIGH (ref 8–23)
CO2: 26 mmol/L (ref 22–32)
Calcium: 9.5 mg/dL (ref 8.9–10.3)
Chloride: 118 mmol/L — ABNORMAL HIGH (ref 98–111)
Creatinine, Ser: 1.94 mg/dL — ABNORMAL HIGH (ref 0.61–1.24)
GFR calc Af Amer: 34 mL/min — ABNORMAL LOW (ref >60)
GFR calc non Af Amer: 29 mL/min — ABNORMAL LOW (ref >60)
Glucose, Bld: 138 mg/dL — ABNORMAL HIGH (ref 70–99)
Potassium: 4.1 mmol/L (ref 3.5–5.1)
Sodium: 159 mmol/L — ABNORMAL HIGH (ref 135–145)
Total Bilirubin: 2.4 mg/dL — ABNORMAL HIGH (ref 0.3–1.2)
Total Protein: 7.5 g/dL (ref 6.5–8.1)

## 2019-09-11 LAB — BASIC METABOLIC PANEL
Anion gap: 14 (ref 5–15)
BUN: 56 mg/dL — ABNORMAL HIGH (ref 8–23)
CO2: 27 mmol/L (ref 22–32)
Calcium: 9 mg/dL (ref 8.9–10.3)
Chloride: 121 mmol/L — ABNORMAL HIGH (ref 98–111)
Creatinine, Ser: 1.85 mg/dL — ABNORMAL HIGH (ref 0.61–1.24)
GFR calc Af Amer: 36 mL/min — ABNORMAL LOW (ref >60)
GFR calc non Af Amer: 31 mL/min — ABNORMAL LOW (ref >60)
Glucose, Bld: 139 mg/dL — ABNORMAL HIGH (ref 70–99)
Potassium: 3.9 mmol/L (ref 3.5–5.1)
Sodium: 162 mmol/L (ref 135–145)

## 2019-09-11 LAB — CBG MONITORING, ED: Glucose-Capillary: 114 mg/dL — ABNORMAL HIGH (ref 70–99)

## 2019-09-11 LAB — BRAIN NATRIURETIC PEPTIDE: B Natriuretic Peptide: 263.2 pg/mL — ABNORMAL HIGH (ref 0.0–100.0)

## 2019-09-11 LAB — TROPONIN I (HIGH SENSITIVITY): Troponin I (High Sensitivity): 231 ng/L (ref <18)

## 2019-09-11 MED ORDER — RIVAROXABAN 15 MG PO TABS
15.0000 mg | ORAL_TABLET | Freq: Every day | ORAL | Status: DC
  Filled 2019-09-11: qty 1

## 2019-09-11 MED ORDER — METOPROLOL TARTRATE 12.5 MG HALF TABLET
12.5000 mg | ORAL_TABLET | Freq: Two times a day (BID) | ORAL | Status: DC
  Filled 2019-09-11: qty 1

## 2019-09-11 MED ORDER — METOPROLOL TARTRATE 5 MG/5ML IV SOLN
5.0000 mg | Freq: Two times a day (BID) | INTRAVENOUS | Status: DC
  Administered 2019-09-11: 5 mg via INTRAVENOUS
  Filled 2019-09-11: qty 5

## 2019-09-11 MED ORDER — SODIUM CHLORIDE 0.9 % IV SOLN
2.0000 g | Freq: Once | INTRAVENOUS | Status: AC
  Administered 2019-09-11: 22:00:00 2 g via INTRAVENOUS
  Filled 2019-09-11: qty 2

## 2019-09-11 MED ORDER — VANCOMYCIN HCL 1500 MG/300ML IV SOLN: 1500.0000 mg | INTRAVENOUS | Status: DC

## 2019-09-11 MED ORDER — SODIUM CHLORIDE 0.9 % IV BOLUS
500.0000 mL | Freq: Once | INTRAVENOUS | Status: AC
  Administered 2019-09-11: 500 mL via INTRAVENOUS

## 2019-09-11 MED ORDER — SODIUM CHLORIDE 0.9 % IV SOLN
2.0000 g | INTRAVENOUS | Status: DC
  Administered 2019-09-12: 2 g via INTRAVENOUS
  Filled 2019-09-11: qty 2

## 2019-09-11 MED ORDER — PANTOPRAZOLE SODIUM 40 MG PO TBEC
40.0000 mg | DELAYED_RELEASE_TABLET | Freq: Two times a day (BID) | ORAL | Status: DC
  Filled 2019-09-11: qty 1

## 2019-09-11 MED ORDER — DILTIAZEM HCL 25 MG/5ML IV SOLN
10.0000 mg | Freq: Once | INTRAVENOUS | Status: AC
  Administered 2019-09-11: 10 mg via INTRAVENOUS
  Filled 2019-09-11: qty 5

## 2019-09-11 MED ORDER — SODIUM CHLORIDE 0.45 % IV SOLN: INTRAVENOUS | Status: DC

## 2019-09-11 MED ORDER — VANCOMYCIN HCL 1500 MG/300ML IV SOLN
1500.0000 mg | Freq: Once | INTRAVENOUS | Status: AC
  Administered 2019-09-11: 1500 mg via INTRAVENOUS
  Filled 2019-09-11: qty 300

## 2019-09-11 MED ORDER — TAMSULOSIN HCL 0.4 MG PO CAPS
0.4000 mg | ORAL_CAPSULE | Freq: Every day | ORAL | Status: DC
  Filled 2019-09-11: qty 1

## 2019-09-11 MED ORDER — SENNA 8.6 MG PO TABS: 1.0000 | ORAL_TABLET | Freq: Every day | ORAL | Status: DC | PRN

## 2019-09-11 MED ORDER — SODIUM CHLORIDE 0.9 % IV SOLN: INTRAVENOUS | Status: DC

## 2019-09-11 NOTE — Progress Notes (Signed)
Pharmacy Antibiotic Note  [name] is a 86 y.o. male admitted on 09/23/2019 with pneumonia.  Pharmacy has been consulted for vancomycin and cefepime dosing.  Chest x-ray from today notes increasing opacity which could be suggestive of CHF with pulmonary edema and effusions or underlying infection may be present in the setting of recent COVID-19 diagnosis. He was recently treated with 4 out of 5 days of remdesivir.  WBC is 5.1 and he is afebrile. No urine or blood cultures have been ordered at this time.  Plan: Vancomycin 1500mg  IV every 48 hours  -Goal trough 15-20 mcg/mL  -Goal AUC 400-550 -Expected AUC: 432 -SCr used: 1.93 Cefepime 2g IV Q24h F/u clinical progress and LOT   Height: 5\' 11"  (180.3 cm) Weight: 169 lb 12.1 oz (77 kg) IBW/kg (Calculated) : 75.3  Temp (24hrs), Avg:97.9 F (36.6 C), Min:97.9 F (36.6 C), Max:97.9 F (36.6 C)  Recent Labs  Lab 09/05/19 1137 09/05/19 1343 09/06/19 0417 09/07/19 0448 09/08/19 0131 09/25/2019 1351  WBC 3.5*  --  3.0* 3.4* 4.4 5.1  CREATININE 2.81*  --  2.36* 1.99* 1.85* 1.94*  LATICACIDVEN  --  1.6  --   --   --   --     Estimated Creatinine Clearance: 25.3 mL/min (A) (by C-G formula based on SCr of 1.94 mg/dL (H)).    Allergies  Allergen Reactions  . Gabapentin Other (See Comments)    Confusion and caused falls  . Tape Other (See Comments)    SKIN IS THIN AND TEARS EASILY!! PLEASE USE AN ALTERNATIVE!!  . Eliquis [Apixaban] Swelling and Other (See Comments)    Causes swollen ankles and blot clot in eyes  . Prednisone Other (See Comments)    "Made me not feel right when I took it"  . Rosuvastatin Other (See Comments)    "Makes me ache"    Antimicrobials this admission: 3/2 vancomycin >>  3/2 cefepime >>   Dose adjustments this admission: N/A  Microbiology results: N/A  Thank you for allowing pharmacy to be a part of this patient's care.  Kennon Holter, PharmD PGY1 Ambulatory Care Pharmacy  Resident [employer] Phone: [phone] 10/03/2019 5:45 PM

## 2019-09-11 NOTE — Progress Notes (Addendum)
FPTS Interim Progress Note  S: Received page that the patient was unable to tolerate oral medications due to altered mental status and the mews score was increased due to tachycardia with heart rates in the 130s.  Reported to bedside in order to evaluate the patient.  Patient is nonverbal and unable to respond to orientation questions.  Patient only moans when asked questions.  Patient unable to communicate whether or not he is in pain.   O: BP (!) 135/96   Pulse (!) 120   Temp 98 F (36.7 C) (Oral)   Resp (!) 27   Ht 5\' 11"  (1.803 m)   Wt 77 kg   SpO2 93%   BMI 23.68 kg/m   General: Frail male lying on left side, intermittently moaning when touched, moves all extremities Neuro: does not follow commands, does not speak words during my attempt to interview him, rarely makes eye contact with this provider   A/P: Atrial Fibrillation, Tachycardia & AMS  -convert oral 12.5mg  BID  metoprolol to IV Metoprolol 5mg  for 2 doses  -patient is s/p 1 dose diltiazem at 1638 -continue cardiac monitoring    *UPDATE @ 2217* Hypernatremia Na increased from 159 to 162  -repeat BMP at 1245 09/12/19 (4 hours after prior)  -NS at 49mL/hr switched to 1/2NS @75mL /hr -will switch IVFs to D5W if sodium continues to uptrend  Elevated Troponin 231 - stat EKG   Stark Klein, MD 09/16/2019, 9:07 PM PGY-1, [place] Medicine Service pager [phone]

## 2019-09-11 NOTE — H&P (Addendum)
[facility] Admission History and Physical Service Pager: [phone]  Patient name: [name] Medical record number: [id number] Date of birth: July 26, 1923 Age: 86 y.o. Gender: male  Primary Care Provider: Leeanne Rio, MD Consultants: Palliative Code Status: Full code Preferred Emergency Contact: [name] [phone]  Chief Complaint: Altered mental status  Assessment and Plan: [name] is a 86 y.o. male presenting with continuation of altered mental status . PMH is significant for recent Covid diagnosis s/p hospitalization with remdesivir (4 doses), HTN, DVT, CVA, systolic and diastolic heart failure, chronic ITP, CKD  Weakness and altered mental status Patient was recently discharged from the hospital after COVID-19 infection.  He was discharged because his respiratory status was stable on oxygen and family felt that his altered mental status was getting worse because of the hospitalization.  They were hoping that his altered mental status would improve once home but it did not.  Since discharge his p.o. intake was poor.  He has been weak and has been wearing oxygen since discharge.  On admission patient's white count was 5.1.  CMP showed a elevated sodium of 159, potassium-4.1, glucose-138, BUN-58, creatinine-1.94 he had a chest x-ray showing increasing opacity suggesting possible CHF with pulmonary edema and effusions vs infiltrations indicating infection.  Head CT showed no acute intracranial pathology but noted age-related atrophy and cortical microvascular ischemia changes in bilateral old infarcts. etiology of this is most likely delirium related to his recent hospitalization and COVID-19 infection.  This may also be related to patient's hypernatremia. 2/4 SIRS criteria with tachycardia and tachypnea, overwhelming infection likely contributing to extreme decline.  -Admit to family practice teaching service, progressive, with Dr.  Dannial Monarch as attending -Follow-up on urinalysis -NS at 75 mL/h -Morning BMPs -Morning CBC -Delirium precautions -Palliative consult for goals of care discussion  COVID-19 infection Patient tested positive for COVID-19 on August 24, 2019.  He was hospitalized for increased shortness of breath and confusion.  He had no oxygen requirement but was admitted due to age and comorbidities.  He was started on remdesivir which she completed on 2/24 due to oxygen saturations dropping to the mid 80s on 2 L nasal cannula.  He received a total of 4 doses of remdesivir.  He was not started on Decadron because he had had a previous reaction to prednisone.  Patient has reportedly had a decrease in cough since discharge and has been afebrile.  WBC on admission was 5.1.  Chest x-ray on admission showed worsening opacities.  Physical exam showed decreased breath sounds in all lung fields but no crackles were heard.  Most likely cause of worsening consolidation is some form of pneumonia.  May also be related to patient's CHF although patient appears hypovolemic so this is less likely. -Morning CBCs -BNP -Oxygen therapy 2 L nasal cannula if O2 drops lower than 92% -Continuous pulse ox and cardiac monitoring -Left decubitus chest x-ray to differentiate between edema vs infiltrate. Must consider fluid status cautiously in setting of sepsis/dehydration in CHF.  -Cefepime per pharmacy -Vancomycin per pharmacy - restart remdesivir - family declined recommendations for decadron  Hypovolemic hypernatremia On admission patient's sodium was 159.  BUN-58, creatinine-1.94 (per chart review appears to be mildly above baseline) patient was reportedly unable to eat or drink since discharge on 2/27.  He has had a total of a couple of vials of water that were syringe fed to him. -Starting normal saline at 75 mL/h -We will recheck BMP at 8 PM tonight -Morning  BMPs  AKI Patient's creatinine on admission was 1.97.  Creatinines  over the past year have ranged from 1.72-2.81. -Morning BMPs -Normal saline at 75 mL an hour -Avoid nephrotoxic agents  HTN Chronic. Stable. Initial BP in ED was 142/85.  Home medications include Metoprolol 12.5 mg BID, Torsemide 20 mg daily, Potassium 20 meq daily. -Continue Metoprolol 12.5 mg BID -Holding torsemide at this time due to apparent dehydration  Afib Chronic. Stable. Home medications include Xarelto 15 mg daily and Metoprolol 12.5 mg twice daily. Received one dose of dilt in ED. -Continue Xarelto 15 mg daiy -Continue Metoprolol 12.5 mg BID   Chronic Back Pain Per chart review patient reported herniated disc at his last admission. Last received 02/04.  Home medications Ibuprofen 200 mg daily, Robaxin 500 mg qhs and Lyrica 75 mg BID.On exam no pain on palpation. -Holding NSAIDs at this time due to AKI -Holding Robaxin and Lyrica at this time due to altered mental status   HLD Chronic. Stable.  Lipid Profile(11/20) wnl.  Home medication pravastatin 20 mg daily -hold Pravastatin 20 mg daily   H/O Rt leg DVT Chronic. Stable.Denies any leg pain or swelling or shortness of breath.  Home medication include Xarelto 15 mg daily -Continue Xarelto 15 mg daily -PT/OT eval   CAD s/p stent placement Chronic. Stable.  Patient has history of MI.  He is aphasic at this time so unsure if any recent chest pain.    EKG showed A. fib with rapid ventricular rate.  Home medication include Metoprolol 12.5 mg BID.  Nitroglycerine 0.4 mg s/l although patient reports has not had to use in years. -Continuous cardiac monitoring. -Continue Metoprolol 12.5 mg BID - repeat ECG after some fluids given -Hold home Nitroglycerine for prn chest pain   BPH Chronic. Stable.  Home medication Flomax 0.4mg  daily -Continue Flomax 0.4 mg daily -Continue to monitor   Chronic ITP Chronic. Stable. On admission platelets 190. Baseline 110-133.    Patient's daughter denies any recent active bleeding -Continue  to monitor -CBC in am   GERD Chronic. Stable.  Home medications include Pepcid 20 mg daily and Protonix 40 mg twice daily. Denies any abdominal pain. -Holding Pepcid at this time -Continue Protonix 40 mg twice daily  FEN/GI: NPO Prophylaxis: On Xarelto  Disposition: Admit to 5 W.  History of Present Illness:  LEDARIUS LEESON is a 86 y.o. male presenting with weakness and altered mental status  Patient was discharged from the hospital for Covid diagnosis on Saturday 2/27.  Since that time he has had sips of fluid, no food.  He was unable to take his medications but they were able to cross up his metoprolol, Xarelto as well as some Tylenol with codeine which was given to him via syringe once per night.  Since discharge she has urinated twice a day and the urine has progressively gotten darker and darker.  Patient has had nonproductive cough since discharge but his daughter reports that it is improved.  At discharge she was able to stand up so they could help change him but since that time he has been unable to stand or walk.  He is able to transfer to a chair with difficulty.  Patient has had no fevers since discharge.  At home he has been on 2 L of oxygen and has remained in the 90s for most of the time.  Patient's daughter does report one occurrence of a 68% oxygen saturation.  Her concern is that she feels the  patient's mental status has worsened, he has been jabbering a lot and is unsure why.  Patient's daughter is concerned that he may have had a stroke because of the jabbering but denies any focal neurological deficits.  Review Of Systems: Per HPI with the following additions:   ROS patient is unable to respond to review of systems questions because of his altered mental status.  We will reassess at a later time  Patient Active Problem List   Diagnosis Date Noted  . Pneumonia due to COVID-19 virus 09/05/2019  . COVID-19 09/05/2019  . AKI (acute kidney injury) ([place])   . Delirium    . Respiratory tract infection due to COVID-19 virus   . Urinary hesitancy 08/23/2019  . Frequent urination 07/30/2019  . Actinic keratosis 07/30/2019  . Skin tear of forearm without complication 45/09/8880  . Abrasion of left index finger 05/25/2019  . Vision changes 05/25/2019  . Gastritis and gastroduodenitis   . Symptomatic anemia   . Acute anemia 05/15/2019  . Chronic kidney disease, stage 4 (severe) ([place]) 05/15/2019  . GI bleed 05/15/2019  . Near syncope   . GERD (gastroesophageal reflux disease) 01/11/2019  . Allergic rhinitis 01/11/2019  . History of GI bleed 03/17/2018  . Persistent atrial fibrillation ([place])   . Cough 04/25/2017  . Chronic ITP (idiopathic thrombocytopenia) (H[facility])   . Coronary atherosclerosis of native coronary artery   . HTN (hypertension)   . Hyperlipidemia 04/23/2013  . Benign prostate hyperplasia 10/04/2011  . Anemia 09/16/2011  . Congestive heart failure with left ventricular diastolic dysfunction ([place]) 05/25/2011    Past Medical History: Past Medical History:  Diagnosis Date  . Abnormal CT of the chest 07/21/2016   January 2018 IMPRESSION: 1. Residual patchy ground-glass opacities throughout both lungs at the resolved areas of consolidation from the 05/16/2016 chest CT, consistent with a resolving infectious or inflammatory process. 2. Bandlike opacity in the anterior right upper lobe is new since 05/16/2016, was probably present on 06/17/2016 chest radiograph, favor evolving postinfectious/postinflammatory   . Allergic rhinitis 01/11/2019  . Benign prostate hyperplasia 10/04/2011  . Bladder neck obstruction 10/04/2011  . Cerebrovascular accident (CVA) ([place]) 08/09/2016  . Chronic combined systolic (congestive) and diastolic (congestive) heart failure ([place])   . Chronic ITP (idiopathic thrombocytopenia) ([facility])   . Chronic kidney disease, stage 4 (severe) ([place]) 05/15/2019  . Coronary atherosclerosis of native coronary artery    a. Anterior STEMI 2009 s/p  BMSx2 to mid & prox LAD and angioplasty to distal LAD. total RCA with collaterals, moderate Cx plaquing. a. NSTEMI 02/2018 - chronically occluded RCA, patent LAD stents and severe stenosis in the obtuse marginal branch treated with a drug eluting stent.  . Degenerative disk disease 12/20/2010   Sees Dr. Eddie Dibbles.  Has had MRI.   . Enlarged prostate with lower urinary tract symptoms (LUTS) 06/09/2011  . GERD (gastroesophageal reflux disease) 01/11/2019  . Gross hematuria   . Hemoptysis 05/16/2016  . History of GI bleed 03/17/2018   Admitted 03/17/18 with acute GI bleeding. EGD showed gastritis but no active bleeding. Aspirin stopped and Plavix and Xareltro continued.   [name] HTN (hypertension)   . Hyperlipidemia 04/23/2013  . Hypotension   . Iron deficiency anemia   . Ischemic cardiomyopathy   . Microscopic hematuria 09/28/2014  . Myocardial infarct ([place]) 03/21/2008  . NSTEMI (non-ST elevated myocardial infarction) ([place]) 05/16/2016  . NSVT (nonsustained ventricular tachycardia) ([place])    a. Per DC summary from time of STEMI 2009.  . Orthostatic hypotension  11/26/2016  . PAD (peripheral artery disease) ([place]) 08/13/2016  . Paroxysmal atrial flutter ([place])   . Persistent atrial fibrillation ([place])   . Phlebitis and thrombophlebitis of superficial vessels of lower extremities   . Pure hypercholesterolemia    a. Has not tolerated statins in the past.  . Right leg DVT ([place])    a. Dx 01/2014.  [name] Sinus bradycardia    a. By prior event monitor.   . Thrombocytopenia ([place])   . Urinary retention 06/20/2011    Past Surgical History: Past Surgical History:  Procedure Laterality Date  . BIOPSY  05/17/2019   Procedure: BIOPSY;  Surgeon: Thornton Park, MD;  Location: [facility];  Service: Gastroenterology;;  . CARPAL TUNNEL RELEASE    . CORONARY STENT INTERVENTION N/A 03/06/2018   Procedure: CORONARY STENT INTERVENTION;  Surgeon: Lorretta Harp, MD;  Location: [facility];  Service: Cardiovascular;   Laterality: N/A;  . CORONARY STENT PLACEMENT  2009  . ESOPHAGOGASTRODUODENOSCOPY (EGD) WITH PROPOFOL N/A 03/19/2018   Procedure: ESOPHAGOGASTRODUODENOSCOPY (EGD) WITH PROPOFOL;  Surgeon: Wonda Horner, MD;  Location: [facility];  Service: Endoscopy;  Laterality: N/A;  . ESOPHAGOGASTRODUODENOSCOPY (EGD) WITH PROPOFOL N/A 05/17/2019   Procedure: ESOPHAGOGASTRODUODENOSCOPY (EGD) WITH PROPOFOL;  Surgeon: Thornton Park, MD;  Location: [facility];  Service: Gastroenterology;  Laterality: N/A;  . LEFT HEART CATH AND CORONARY ANGIOGRAPHY N/A 03/06/2018   Procedure: LEFT HEART CATH AND CORONARY ANGIOGRAPHY;  Surgeon: Lorretta Harp, MD;  Location: [facility];  Service: Cardiovascular;  Laterality: N/A;  . REPLACEMENT TOTAL KNEE BILATERAL     Left 2014 (Dr. Eddie Dibbles); right 2012 (Dr. Redmond Pulling)  . trigger finger surgery      Social History: Social History   Tobacco Use  . Smoking status: Never Smoker  . Smokeless tobacco: Never Used  Substance Use Topics  . Alcohol use: No  . Drug use: No    Family History: Family History  Problem Relation Age of Onset  . Cancer Sister   . Cancer Sister    Allergies and Medications: Allergies  Allergen Reactions  . Gabapentin Other (See Comments)    Confusion and caused falls  . Tape Other (See Comments)    SKIN IS THIN AND TEARS EASILY!! PLEASE USE AN ALTERNATIVE!!  . Eliquis [Apixaban] Swelling and Other (See Comments)    Causes swollen ankles and blot clot in eyes  . Prednisone Other (See Comments)    "Made me not feel right when I took it"  . Rosuvastatin Other (See Comments)    "Makes me ache"   No current facility-administered medications on file prior to encounter.   Current Outpatient Medications on File Prior to Encounter  Medication Sig Dispense Refill  . acetaminophen (TYLENOL) 325 MG tablet Take 325-650 mg by mouth every 6 (six) hours as needed for mild pain or headache.    . famotidine (PEPCID) 20 MG tablet Take 1 tablet  (20 mg total) by mouth 2 (two) times daily. 180 tablet 1  . ferrous sulfate 325 (65 FE) MG tablet Take 1 tablet (325 mg total) by mouth 2 (two) times daily with a meal. 180 tablet 1  . fluticasone (FLONASE) 50 MCG/ACT nasal spray PLACE 2 SPRAYS INTO BOTH NOSTRILS DAILY AS NEEDED FOR ALLERGIES OR RHINITIS. (Patient taking differently: Place 1 spray into both nostrils daily as needed for allergies or rhinitis. ) 16 mL 3  . KLOR-CON M20 20 MEQ tablet TAKE 2 TABLETS BY MOUTH DAILY (Patient taking differently: Take 20 mEq  by mouth daily. ) 180 tablet 2  . loratadine (CLARITIN) 10 MG tablet Take 10 mg by mouth daily as needed for allergies.     . methocarbamol (ROBAXIN) 500 MG tablet Take 500 mg by mouth at bedtime.     . metoprolol tartrate (LOPRESSOR) 25 MG tablet Take 0.5 tablets (12.5 mg total) by mouth 2 (two) times daily. 30 tablet 11  . nitroGLYCERIN (NITROSTAT) 0.4 MG SL tablet Place 0.4 mg under the tongue every 5 (five) minutes as needed for chest pain.    . pantoprazole (PROTONIX) 40 MG tablet TAKE 1 TABLET BY MOUTH TWICE A DAY (Patient taking differently: Take 40 mg by mouth 2 (two) times daily before a meal. ) 180 tablet 1  . Polyethyl Glyc-Propyl Glyc PF (SYSTANE ULTRA PF) 0.4-0.3 % SOLN Place 1-2 drops into both eyes every morning.    . pravastatin (PRAVACHOL) 20 MG tablet Take 1 tablet (20 mg total) by mouth daily at 6 PM. 90 tablet 1  . pregabalin (LYRICA) 75 MG capsule Take 75 mg by mouth 2 (two) times daily.    . Rivaroxaban (XARELTO) 15 MG TABS tablet Take 1 tablet (15 mg total) by mouth daily with supper. 30 tablet 6  . senna (SENOKOT) 8.6 MG TABS tablet Take 1 tablet (8.6 mg total) by mouth daily as needed for mild constipation. (Patient taking differently: Take 1 tablet by mouth every morning. ) 30 each 0  . tamsulosin (FLOMAX) 0.4 MG CAPS capsule Take 1 capsule (0.4 mg total) by mouth daily after supper. To help urine stream (Patient taking differently: Take 0.4 mg by mouth. To help  urine stream) 30 capsule 3  . urea (CARMOL) 10 % cream Apply topically as needed. (Patient taking differently: Apply 1 application topically as needed (on dry areas of the legs). ) 71 g 0  . torsemide (DEMADEX) 20 MG tablet Take 1 tablet by mouth daily, may take 1 extra tablet daily only as needed for swelling or weight gain of 3 lbs in one day or 5 lbs in a few days (Patient taking differently: Take 20 mg by mouth See admin instructions. Take 20 mg by mouth in the morning and an additional 20 mg once daily as needed for swelling or weight gain of 3 lbs in one day or 5 lbs in a few days) 60 tablet 3  . traMADol (ULTRAM) 50 MG tablet Take 50 mg by mouth daily as needed (for pain).       Objective: BP (!) 142/85 (BP Location: Right Arm)   Pulse (!) 103   Temp 97.9 F (36.6 C) (Oral)   Resp (!) 21   Ht 5\' 11"  (1.803 m)   Wt 77 kg   SpO2 98%   BMI 23.68 kg/m  Physical Exam  Constitutional:  Not well-appearing, lying on the left side.  Intermittently follows commands  HENT:  Head: Normocephalic and atraumatic.  Right pinna is partially removed from cast surgical procedure, dry mucous membranes  Eyes: Pupils are equal, round, and reactive to light.  Patient is moving eyes and looking in different directions but will not follow commands so unable to fully assess extraocular movement  Cardiovascular: Intact distal pulses.  Tachycardic in rate, unable to hear any murmurs   Pulmonary/Chest:  Patient was not in respiratory distress.  Patient did have decreased breath sounds in all lung fields although patient would not take deep breaths or follow commands.  It was also difficult to hear using disposable stethoscope.  No  wheezes or crackles noted  Abdominal: Soft. He exhibits no distension. There is abdominal tenderness.  Patient moans when palpating abdomen  Musculoskeletal:        General: No tenderness, deformity or edema.     Cervical back: Normal range of motion.     Comments: Patient  would not follow commands regarding movements.  Lymphadenopathy:    He has cervical adenopathy.  Neurological:  Patient intermittently awake and nonresponsive.  Will occasionally follow commands including gripping my hand or moving his lower extremities.     Labs and Imaging: CBC BMET  Recent Labs  Lab 09/30/2019 1351  WBC 5.1  HGB 16.7  HCT 55.7*  PLT 190   Recent Labs  Lab 09/17/2019 1351  NA 159*  K 4.1  CL 118*  CO2 26  BUN 58*  CREATININE 1.94*  GLUCOSE 138*  CALCIUM 9.5     EKG: Atrial fibrillation with rapid ventricular rate  CT Head Wo Contrast  Result Date: 09/17/2019 CLINICAL DATA:  86 year old male with encephalopathy. EXAM: CT HEAD WITHOUT CONTRAST TECHNIQUE: Contiguous axial images were obtained from the base of the skull through the vertex without intravenous contrast. COMPARISON:  Head CT dated 05/23/2019. FINDINGS: Evaluation of this exam is limited due to motion artifact. Brain: There is moderate age-related atrophy and chronic microvascular ischemic changes. Areas of old infarct and encephalomalacia noted in the left temporal lobe, right parietal and right frontal lobes. There is no acute intracranial hemorrhage. No mass effect or midline shift. No extra-axial fluid collection. Vascular: No hyperdense vessel or unexpected calcification. Skull: Normal. Negative for fracture or focal lesion. Sinuses/Orbits: Small bilateral maxillary sinus retention cysts or polyps. No air-fluid level. The mastoid air cells are clear. Other: None IMPRESSION: 1. No acute intracranial pathology. 2. Age-related atrophy and chronic microvascular ischemic changes and bilateral old infarcts. Electronically Signed   By: Anner Crete M.D.   On: 10/10/2019 15:59   DG Chest Portable 1 View  Result Date: 10/03/2019 CLINICAL DATA:  Cough, COVID-19 EXAM: PORTABLE CHEST 1 VIEW COMPARISON:  Radiograph 09/05/2019 FINDINGS: There are increasing bibasilar opacities in both lungs but with overall  diminished lung volumes when compared to prior studies. Obscuration of the bilateral hemidiaphragms and costophrenic sulci may reflect some layering effusion. Mild central venous congestion and fissural thickening is present as well. Cardiac silhouette appears slightly enlarged this may be accentuated by portable technique. Extensive vascular calcium in the coronary stent is present atherosclerotic calcification of the thoracic aorta is seen as well. No acute osseous or soft tissue abnormality. Degenerative changes are present in the imaged spine and shoulders. IMPRESSION: Increasing opacity some features suggesting CHF with pulmonary edema and effusions. Underlying infection may be present as well in the setting of COVID-19 diagnosis. Electronically Signed   By: Lovena Le M.D.   On: 10/05/2019 15:11     Gifford Shave, MD 09/24/2019, 4:08 PM PGY-1, [facility] pager: [phone], text pages welcome

## 2019-09-11 NOTE — Progress Notes (Signed)
This was scheduled as a telemedicine visit, but it was really just a 6 minute phone call with patient's daughter [name] to discuss next best steps. Patient recently admitted for [place]. Since discharge has not done very well. Has had continued altered mental status. Family is unable to get fluids into patient. They think he "has some life left in him" and would like to have him treated in the hospital. [name] asked about whether ambulance transport to the hospital for ED assessment and admission is reasonable. Advised it is very reasonable. Recommend they call EMS and have him brought to [facility]. ED provider will see him, and if deemed appropriate for admission (which it sounds like he will be) they can contact our Family Medicine team to admit him.  Confirmed family would like him to be full code for now. Discussed that people his age typically do not do well after CPR. [name] confirms this is still what they would like for now, but they are very open to the idea of hospice if it seems he will not improve. They had a very good experience with hospice care for patient's late wife.  No charge for phone call as I anticipate our team will bill for a hospital H&P.  Chrisandra Netters, MD [place]

## 2019-09-11 NOTE — ED Notes (Signed)
Report called to [facility]

## 2019-09-11 NOTE — ED Provider Notes (Signed)
[facility] EMERGENCY DEPARTMENT Provider Note   CSN: [id number] Arrival date & time: 09/28/2019  1328  LEVEL 5 CAVEAT - ALTERED MENTAL STATUS   History Chief Complaint  Patient presents with  . Dehydration  . Covid-19    [name] is a 86 y.o. male.  HPI 86 year old male presents with weakness and altered mental status.  Patient was diagnosed with Covid pneumonia last week.  Admitted to the hospital and discharged at the end of the week.  Was discharged partially because the family wanted him to come home because he was becoming more altered in the hospital.  His altered mental status seems to be better when family is present.  However since going home his p.o. intake has been very poor.  No vomiting or diarrhea.  Cough is improving.  He is still wearing the oxygen he was discharged with with no apparent increased work of breathing. Stopped his diuretic because of kidney injury.   Past Medical History:  Diagnosis Date  . Abnormal CT of the chest 07/21/2016   January 2018 IMPRESSION: 1. Residual patchy ground-glass opacities throughout both lungs at the resolved areas of consolidation from the 05/16/2016 chest CT, consistent with a resolving infectious or inflammatory process. 2. Bandlike opacity in the anterior right upper lobe is new since 05/16/2016, was probably present on 06/17/2016 chest radiograph, favor evolving postinfectious/postinflammatory   . Allergic rhinitis 01/11/2019  . Benign prostate hyperplasia 10/04/2011  . Bladder neck obstruction 10/04/2011  . Cerebrovascular accident (CVA) ([place]) 08/09/2016  . Chronic combined systolic (congestive) and diastolic (congestive) heart failure ([place])   . Chronic ITP (idiopathic thrombocytopenia) (H[facility])   . Chronic kidney disease, stage 4 (severe) ([place]) 05/15/2019  . Coronary atherosclerosis of native coronary artery    a. Anterior STEMI 2009 s/p BMSx2 to mid & prox LAD and angioplasty to distal LAD. total RCA with  collaterals, moderate Cx plaquing. a. NSTEMI 02/2018 - chronically occluded RCA, patent LAD stents and severe stenosis in the obtuse marginal branch treated with a drug eluting stent.  . Degenerative disk disease 12/20/2010   Sees Dr. Eddie Dibbles.  Has had MRI.   . Enlarged prostate with lower urinary tract symptoms (LUTS) 06/09/2011  . GERD (gastroesophageal reflux disease) 01/11/2019  . Gross hematuria   . Hemoptysis 05/16/2016  . History of GI bleed 03/17/2018   Admitted 03/17/18 with acute GI bleeding. EGD showed gastritis but no active bleeding. Aspirin stopped and Plavix and Xareltro continued.   [name] HTN (hypertension)   . Hyperlipidemia 04/23/2013  . Hypotension   . Iron deficiency anemia   . Ischemic cardiomyopathy   . Microscopic hematuria 09/28/2014  . Myocardial infarct ([place]) 03/21/2008  . NSTEMI (non-ST elevated myocardial infarction) ([place]) 05/16/2016  . NSVT (nonsustained ventricular tachycardia) ([place])    a. Per DC summary from time of STEMI 2009.  . Orthostatic hypotension 11/26/2016  . PAD (peripheral artery disease) ([place]) 08/13/2016  . Paroxysmal atrial flutter ([place])   . Persistent atrial fibrillation ([place])   . Phlebitis and thrombophlebitis of superficial vessels of lower extremities   . Pure hypercholesterolemia    a. Has not tolerated statins in the past.  . Right leg DVT ([place])    a. Dx 01/2014.  [name] Sinus bradycardia    a. By prior event monitor.   . Thrombocytopenia ([place])   . Urinary retention 06/20/2011    Patient Active Problem List   Diagnosis Date Noted  . Pneumonia due to COVID-19 virus 09/05/2019  . COVID-19  09/05/2019  . AKI (acute kidney injury) ([place])   . Delirium   . Respiratory tract infection due to COVID-19 virus   . Urinary hesitancy 08/23/2019  . Frequent urination 07/30/2019  . Actinic keratosis 07/30/2019  . Skin tear of forearm without complication 62/94/7654  . Abrasion of left index finger 05/25/2019  . Vision changes 05/25/2019  . Gastritis and  gastroduodenitis   . Symptomatic anemia   . Acute anemia 05/15/2019  . Chronic kidney disease, stage 4 (severe) ([place]) 05/15/2019  . GI bleed 05/15/2019  . Near syncope   . GERD (gastroesophageal reflux disease) 01/11/2019  . Allergic rhinitis 01/11/2019  . History of GI bleed 03/17/2018  . Persistent atrial fibrillation ([place])   . Cough 04/25/2017  . Chronic ITP (idiopathic thrombocytopenia) (H[facility])   . Coronary atherosclerosis of native coronary artery   . HTN (hypertension)   . Hyperlipidemia 04/23/2013  . Benign prostate hyperplasia 10/04/2011  . Anemia 09/16/2011  . Congestive heart failure with left ventricular diastolic dysfunction ([place]) 05/25/2011    Past Surgical History:  Procedure Laterality Date  . BIOPSY  05/17/2019   Procedure: BIOPSY;  Surgeon: Thornton Park, MD;  Location: [facility];  Service: Gastroenterology;;  . CARPAL TUNNEL RELEASE    . CORONARY STENT INTERVENTION N/A 03/06/2018   Procedure: CORONARY STENT INTERVENTION;  Surgeon: Lorretta Harp, MD;  Location: [facility];  Service: Cardiovascular;  Laterality: N/A;  . CORONARY STENT PLACEMENT  2009  . ESOPHAGOGASTRODUODENOSCOPY (EGD) WITH PROPOFOL N/A 03/19/2018   Procedure: ESOPHAGOGASTRODUODENOSCOPY (EGD) WITH PROPOFOL;  Surgeon: Wonda Horner, MD;  Location: [facility] ENDOS[facility];  Service: Endoscopy;  Laterality: N/A;  . ESOPHAGOGASTRODUODENOSCOPY (EGD) WITH PROPOFOL N/A 05/17/2019   Procedure: ESOPHAGOGASTRODUODENOSCOPY (EGD) WITH PROPOFOL;  Surgeon: Thornton Park, MD;  Location: [facility];  Service: Gastroenterology;  Laterality: N/A;  . LEFT HEART CATH AND CORONARY ANGIOGRAPHY N/A 03/06/2018   Procedure: LEFT HEART CATH AND CORONARY ANGIOGRAPHY;  Surgeon: Lorretta Harp, MD;  Location: [facility];  Service: Cardiovascular;  Laterality: N/A;  . REPLACEMENT TOTAL KNEE BILATERAL     Left 2014 (Dr. Eddie Dibbles); right 2012 (Dr. Redmond Pulling)  . trigger finger surgery         Family History    Problem Relation Age of Onset  . Cancer Sister   . Cancer Sister     Social History   Tobacco Use  . Smoking status: Never Smoker  . Smokeless tobacco: Never Used  Substance Use Topics  . Alcohol use: No  . Drug use: No    Home Medications Prior to Admission medications   Medication Sig Start Date End Date Taking? Authorizing Provider  acetaminophen (TYLENOL) 325 MG tablet Take 325-650 mg by mouth every 6 (six) hours as needed for mild pain or headache.    [provider]  famotidine (PEPCID) 20 MG tablet Take 1 tablet (20 mg total) by mouth 2 (two) times daily. 07/04/19   Leeanne Rio, MD  ferrous sulfate 325 (65 FE) MG tablet Take 1 tablet (325 mg total) by mouth 2 (two) times daily with a meal. 06/13/19   Leeanne Rio, MD  fluticasone (FLONASE) 50 MCG/ACT nasal spray PLACE 2 SPRAYS INTO BOTH NOSTRILS DAILY AS NEEDED FOR ALLERGIES OR RHINITIS. Patient taking differently: Place 1 spray into both nostrils daily.  08/28/19   Leeanne Rio, MD  KLOR-CON M20 20 MEQ tablet TAKE 2 TABLETS BY MOUTH DAILY Patient taking differently: Take 20 mEq by mouth daily.  07/03/19  Burnell Blanks, MD  loratadine (CLARITIN) 10 MG tablet Take 10 mg by mouth daily as needed for allergies.     [provider]  methocarbamol (ROBAXIN) 500 MG tablet Take 500 mg by mouth at bedtime.  05/10/19   [provider]  metoprolol tartrate (LOPRESSOR) 25 MG tablet Take 0.5 tablets (12.5 mg total) by mouth 2 (two) times daily. 05/04/19   Burnell Blanks, MD  nitroGLYCERIN (NITROSTAT) 0.4 MG SL tablet Place 0.4 mg under the tongue every 5 (five) minutes as needed for chest pain.    [provider]  pantoprazole (PROTONIX) 40 MG tablet TAKE 1 TABLET BY MOUTH TWICE A DAY Patient taking differently: Take 40 mg by mouth 2 (two) times daily before a meal.  07/20/19   Leeanne Rio, MD  Polyethyl Glyc-Propyl Glyc PF (SYSTANE ULTRA PF) 0.4-0.3 %  SOLN Place 1-2 drops into both eyes every morning.    [provider]  pravastatin (PRAVACHOL) 20 MG tablet Take 1 tablet (20 mg total) by mouth daily at 6 PM. 01/11/19   Leeanne Rio, MD  pregabalin (LYRICA) 75 MG capsule Take 75 mg by mouth 2 (two) times daily.    [provider]  Rivaroxaban (XARELTO) 15 MG TABS tablet Take 1 tablet (15 mg total) by mouth daily with supper. 04/30/19   Burnell Blanks, MD  senna (SENOKOT) 8.6 MG TABS tablet Take 1 tablet (8.6 mg total) by mouth daily as needed for mild constipation. Patient taking differently: Take 1 tablet by mouth every morning.  04/14/18   Nuala Alpha, DO  tamsulosin (FLOMAX) 0.4 MG CAPS capsule Take 1 capsule (0.4 mg total) by mouth daily after supper. To help urine stream Patient taking differently: Take 0.4 mg by mouth. To help urine stream 08/23/19   Hensel, Jamal Collin, MD  torsemide (DEMADEX) 20 MG tablet Take 1 tablet by mouth daily, may take 1 extra tablet daily only as needed for swelling or weight gain of 3 lbs in one day or 5 lbs in a few days Patient taking differently: Take 20 mg by mouth See admin instructions. Take 20 mg by mouth in the morning and an additional 20 mg once daily as needed for swelling or weight gain of 3 lbs in one day or 5 lbs in a few days 06/05/19   Charlie Pitter, PA-C  traMADol (ULTRAM) 50 MG tablet Take 50 mg by mouth daily as needed (for pain).  06/13/19   [provider]  urea (CARMOL) 10 % cream Apply topically as needed. Patient taking differently: Apply 1 application topically as needed (on dry areas of the legs).  07/23/19   Guadalupe Dawn, MD    Allergies    Gabapentin, Eliquis [apixaban], Prednisone, and Rosuvastatin  Review of Systems   Review of Systems  Unable to perform ROS: Mental status change    Physical Exam Updated Vital Signs BP (!) 142/85 (BP Location: Right Arm)   Pulse (!) 103   Temp 97.9 F (36.6 C) (Oral)   Resp (!) 21   Ht 5\' 11"   (1.803 m)   Wt 77 kg   SpO2 98%   BMI 23.68 kg/m   Physical Exam Vitals and nursing note reviewed.  Constitutional:      General: He is not in acute distress.    Appearance: He is well-developed.  HENT:     Head: Normocephalic and atraumatic.     Right Ear: External ear normal.     Left  Ear: External ear normal.     Nose: Nose normal.  Eyes:     General:        Right eye: No discharge.        Left eye: No discharge.  Cardiovascular:     Rate and Rhythm: Tachycardia present. Rhythm irregular.     Heart sounds: Normal heart sounds.  Pulmonary:     Effort: Pulmonary effort is normal.  Abdominal:     General: There is no distension.     Palpations: Abdomen is soft.     Tenderness: There is no abdominal tenderness.  Musculoskeletal:     Cervical back: Neck supple.     Right lower leg: No edema.     Left lower leg: No edema.  Skin:    General: Skin is warm and dry.  Neurological:     Mental Status: He is alert.  Psychiatric:        Mood and Affect: Mood is not anxious.     ED Results / Procedures / Treatments   Labs (all labs ordered are listed, but only abnormal results are displayed) Labs Reviewed  COMPREHENSIVE METABOLIC PANEL - Abnormal; Notable for the following components:      Result Value   Sodium 159 (*)    Chloride 118 (*)    Glucose, Bld 138 (*)    BUN 58 (*)    Creatinine, Ser 1.94 (*)    Albumin 3.0 (*)    AST 43 (*)    Total Bilirubin 2.4 (*)    GFR calc non Af Amer 29 (*)    GFR calc Af Amer 34 (*)    All other components within normal limits  CBC WITH DIFFERENTIAL/PLATELET - Abnormal; Notable for the following components:   RBC 6.12 (*)    HCT 55.7 (*)    Abs Immature Granulocytes 0.08 (*)    All other components within normal limits  CBG MONITORING, ED - Abnormal; Notable for the following components:   Glucose-Capillary 114 (*)    All other components within normal limits  URINALYSIS, ROUTINE W REFLEX MICROSCOPIC  BRAIN NATRIURETIC  PEPTIDE    EKG EKG Interpretation  Date/Time:  Tuesday September 11 2019 15:01:19 EST Ventricular Rate:  141 PR Interval:    QRS Duration: 105 QT Interval:  325 QTC Calculation: 498 R Axis:   -102 Text Interpretation: Atrial fibrillation with rapid V-rate Probable right ventricular hypertrophy Inferior infarct, old Lateral leads are also involved no significant change since earlier in the day Confirmed by Sherwood Gambler [phone]) on 10/06/2019 3:20:26 PM   Radiology DG Chest Portable 1 View  Result Date: 10/01/2019 CLINICAL DATA:  Cough, COVID-19 EXAM: PORTABLE CHEST 1 VIEW COMPARISON:  Radiograph 09/05/2019 FINDINGS: There are increasing bibasilar opacities in both lungs but with overall diminished lung volumes when compared to prior studies. Obscuration of the bilateral hemidiaphragms and costophrenic sulci may reflect some layering effusion. Mild central venous congestion and fissural thickening is present as well. Cardiac silhouette appears slightly enlarged this may be accentuated by portable technique. Extensive vascular calcium in the coronary stent is present atherosclerotic calcification of the thoracic aorta is seen as well. No acute osseous or soft tissue abnormality. Degenerative changes are present in the imaged spine and shoulders. IMPRESSION: Increasing opacity some features suggesting CHF with pulmonary edema and effusions. Underlying infection may be present as well in the setting of COVID-19 diagnosis. Electronically Signed   By: Lovena Le M.D.   On: 09/15/2019 15:11  Procedures .Critical Care Performed by: Sherwood Gambler, MD Authorized by: Sherwood Gambler, MD   Critical care provider statement:    Critical care time (minutes):  30   Critical care time was exclusive of:  Separately billable procedures and treating other patients   Critical care was necessary to treat or prevent imminent or life-threatening deterioration of the following conditions:  Circulatory failure    Critical care was time spent personally by me on the following activities:  Discussions with consultants, evaluation of patient's response to treatment, examination of patient, ordering and performing treatments and interventions, ordering and review of laboratory studies, ordering and review of radiographic studies, pulse oximetry, re-evaluation of patient's condition, obtaining history from patient or surrogate and review of old charts   (including critical care time)  Medications Ordered in ED Medications  diltiazem (CARDIZEM) injection 10 mg (has no administration in time range)  sodium chloride 0.9 % bolus 500 mL (500 mLs Intravenous New Bag/Given 09/21/2019 1435)    ED Course  I have reviewed the triage vital signs and the nursing notes.  Pertinent labs & imaging results that were available during my care of the patient were reviewed by me and considered in my medical decision making (see chart for details).    MDM Rules/Calculators/A&P                      Patient is awake and alert.  He is quite tachycardic to about 130 with A. fib.  Blood pressure is okay.  On exam and history, I am most suspicious for dehydration given decreased p.o. intake and no extremity edema.  Chest x-ray shows concern for possible CHF though this would not make as much sense clinically.  We will add BNP to the work-up.  For now will give small dose of Cardizem in addition to small bolus of fluids.  Family practice consulted for admission.  [name] was evaluated in Emergency Department on 09/14/2019 for the symptoms described in the history of present illness. He was evaluated in the context of the global COVID-19 pandemic, which necessitated consideration that the patient might be at risk for infection with the SARS-CoV-2 virus that causes COVID-19. Institutional protocols and algorithms that pertain to the evaluation of patients at risk for COVID-19 are in a state of rapid change based on information  released by regulatory bodies including the CDC and federal and state organizations. These policies and algorithms were followed during the patient's care in the ED.  Final Clinical Impression(s) / ED Diagnoses Final diagnoses:  Acute dehydration  Atrial fibrillation with RVR ([place])    Rx / DC Orders ED Discharge Orders    None       Sherwood Gambler, MD 09/29/2019 1553

## 2019-09-11 NOTE — Plan of Care (Signed)
Care plan reviewed A. Eulas Post

## 2019-09-11 NOTE — ED Triage Notes (Signed)
Pt BIB [facility] from home. Per EMS daughter called as she thinks pt is dehydrated. Pt was diagnosed with covid-19 2/24. SpO2 98% 4L [place]. A&Ox4. VSS.

## 2019-09-12 ENCOUNTER — Encounter (HOSPITAL_COMMUNITY): Payer: Self-pay | Admitting: Family Medicine

## 2019-09-12 DIAGNOSIS — I2510 Atherosclerotic heart disease of native coronary artery without angina pectoris: Secondary | ICD-10-CM

## 2019-09-12 DIAGNOSIS — T17908A Unspecified foreign body in respiratory tract, part unspecified causing other injury, initial encounter: Secondary | ICD-10-CM | POA: Diagnosis present

## 2019-09-12 DIAGNOSIS — E860 Dehydration: Secondary | ICD-10-CM

## 2019-09-12 DIAGNOSIS — R410 Disorientation, unspecified: Secondary | ICD-10-CM

## 2019-09-12 DIAGNOSIS — J188 Other pneumonia, unspecified organism: Secondary | ICD-10-CM | POA: Diagnosis present

## 2019-09-12 DIAGNOSIS — N184 Chronic kidney disease, stage 4 (severe): Secondary | ICD-10-CM

## 2019-09-12 DIAGNOSIS — Z7189 Other specified counseling: Secondary | ICD-10-CM

## 2019-09-12 DIAGNOSIS — I5020 Unspecified systolic (congestive) heart failure: Secondary | ICD-10-CM

## 2019-09-12 DIAGNOSIS — G9389 Other specified disorders of brain: Secondary | ICD-10-CM

## 2019-09-12 DIAGNOSIS — I4821 Permanent atrial fibrillation: Secondary | ICD-10-CM | POA: Diagnosis present

## 2019-09-12 DIAGNOSIS — J189 Pneumonia, unspecified organism: Secondary | ICD-10-CM | POA: Diagnosis present

## 2019-09-12 DIAGNOSIS — Z515 Encounter for palliative care: Secondary | ICD-10-CM

## 2019-09-12 DIAGNOSIS — I4891 Unspecified atrial fibrillation: Secondary | ICD-10-CM

## 2019-09-12 DIAGNOSIS — U071 COVID-19: Principal | ICD-10-CM

## 2019-09-12 DIAGNOSIS — R778 Other specified abnormalities of plasma proteins: Secondary | ICD-10-CM

## 2019-09-12 DIAGNOSIS — E870 Hyperosmolality and hypernatremia: Secondary | ICD-10-CM

## 2019-09-12 LAB — BASIC METABOLIC PANEL
Anion gap: 12 (ref 5–15)
Anion gap: 13 (ref 5–15)
Anion gap: 14 (ref 5–15)
Anion gap: 13 (ref 5–15)
BUN: 56 mg/dL — ABNORMAL HIGH (ref 8–23)
BUN: 53 mg/dL — ABNORMAL HIGH (ref 8–23)
BUN: 48 mg/dL — ABNORMAL HIGH (ref 8–23)
BUN: 47 mg/dL — ABNORMAL HIGH (ref 8–23)
CO2: 23 mmol/L (ref 22–32)
CO2: 26 mmol/L (ref 22–32)
CO2: 24 mmol/L (ref 22–32)
CO2: 22 mmol/L (ref 22–32)
Calcium: 8.9 mg/dL (ref 8.9–10.3)
Calcium: 8.9 mg/dL (ref 8.9–10.3)
Calcium: 9 mg/dL (ref 8.9–10.3)
Calcium: 8.8 mg/dL — ABNORMAL LOW (ref 8.9–10.3)
Chloride: 124 mmol/L — ABNORMAL HIGH (ref 98–111)
Chloride: 122 mmol/L — ABNORMAL HIGH (ref 98–111)
Chloride: 123 mmol/L — ABNORMAL HIGH (ref 98–111)
Chloride: 122 mmol/L — ABNORMAL HIGH (ref 98–111)
Creatinine, Ser: 1.81 mg/dL — ABNORMAL HIGH (ref 0.61–1.24)
Creatinine, Ser: 1.74 mg/dL — ABNORMAL HIGH (ref 0.61–1.24)
Creatinine, Ser: 1.75 mg/dL — ABNORMAL HIGH (ref 0.61–1.24)
Creatinine, Ser: 1.78 mg/dL — ABNORMAL HIGH (ref 0.61–1.24)
GFR calc Af Amer: 37 mL/min — ABNORMAL LOW (ref >60)
GFR calc Af Amer: 38 mL/min — ABNORMAL LOW (ref >60)
GFR calc Af Amer: 38 mL/min — ABNORMAL LOW (ref >60)
GFR calc Af Amer: 37 mL/min — ABNORMAL LOW (ref >60)
GFR calc non Af Amer: 32 mL/min — ABNORMAL LOW (ref >60)
GFR calc non Af Amer: 33 mL/min — ABNORMAL LOW (ref >60)
GFR calc non Af Amer: 33 mL/min — ABNORMAL LOW (ref >60)
GFR calc non Af Amer: 32 mL/min — ABNORMAL LOW (ref >60)
Glucose, Bld: 137 mg/dL — ABNORMAL HIGH (ref 70–99)
Glucose, Bld: 167 mg/dL — ABNORMAL HIGH (ref 70–99)
Glucose, Bld: 150 mg/dL — ABNORMAL HIGH (ref 70–99)
Glucose, Bld: 169 mg/dL — ABNORMAL HIGH (ref 70–99)
Potassium: 3.7 mmol/L (ref 3.5–5.1)
Potassium: 3.7 mmol/L (ref 3.5–5.1)
Potassium: 3.6 mmol/L (ref 3.5–5.1)
Potassium: 3.7 mmol/L (ref 3.5–5.1)
Sodium: 159 mmol/L — ABNORMAL HIGH (ref 135–145)
Sodium: 161 mmol/L (ref 135–145)
Sodium: 161 mmol/L (ref 135–145)
Sodium: 157 mmol/L — ABNORMAL HIGH (ref 135–145)

## 2019-09-12 LAB — CBC
HCT: 51.2 % (ref 39.0–52.0)
Hemoglobin: 15.7 g/dL (ref 13.0–17.0)
MCH: 27.4 pg (ref 26.0–34.0)
MCHC: 30.7 g/dL (ref 30.0–36.0)
MCV: 89.4 fL (ref 80.0–100.0)
Platelets: 150 10*3/uL (ref 150–400)
RBC: 5.73 MIL/uL (ref 4.22–5.81)
RDW: 15 % (ref 11.5–15.5)
WBC: 5.8 10*3/uL (ref 4.0–10.5)
nRBC: 0 % (ref 0.0–0.2)

## 2019-09-12 LAB — TROPONIN I (HIGH SENSITIVITY)
Troponin I (High Sensitivity): 252 ng/L (ref <18)
Troponin I (High Sensitivity): 229 ng/L (ref <18)
Troponin I (High Sensitivity): 175 ng/L (ref <18)

## 2019-09-12 LAB — C-REACTIVE PROTEIN
CRP: 13 mg/dL — ABNORMAL HIGH (ref <1.0)
CRP: 13.4 mg/dL — ABNORMAL HIGH (ref <1.0)

## 2019-09-12 LAB — MRSA PCR SCREENING: MRSA by PCR: NEGATIVE

## 2019-09-12 LAB — D-DIMER, QUANTITATIVE: D-Dimer, Quant: 1.65 ug/mL-FEU — ABNORMAL HIGH (ref 0.00–0.50)

## 2019-09-12 LAB — FERRITIN: Ferritin: 213 ng/mL (ref 24–336)

## 2019-09-12 MED ORDER — ENOXAPARIN SODIUM 60 MG/0.6ML ~~LOC~~ SOLN
60.0000 mg | SUBCUTANEOUS | Status: DC
  Administered 2019-09-12 – 2019-09-14 (×3): 60 mg via SUBCUTANEOUS
  Filled 2019-09-12 (×3): qty 0.6

## 2019-09-12 MED ORDER — DEXTROSE-NACL 5-0.45 % IV SOLN: INTRAVENOUS | Status: DC

## 2019-09-12 MED ORDER — METOPROLOL TARTRATE 5 MG/5ML IV SOLN
5.0000 mg | Freq: Four times a day (QID) | INTRAVENOUS | Status: DC
  Administered 2019-09-12 – 2019-09-18 (×20): 5 mg via INTRAVENOUS
  Filled 2019-09-12 (×21): qty 5

## 2019-09-12 MED ORDER — NON FORMULARY: 3.0000 mg | Freq: Every evening | Status: DC | PRN

## 2019-09-12 MED ORDER — PANTOPRAZOLE SODIUM 40 MG IV SOLR
40.0000 mg | Freq: Two times a day (BID) | INTRAVENOUS | Status: DC
  Administered 2019-09-12 – 2019-09-18 (×13): 40 mg via INTRAVENOUS
  Filled 2019-09-12 (×13): qty 40

## 2019-09-12 MED ORDER — DEXTROSE 5 % IV SOLN: INTRAVENOUS | Status: DC

## 2019-09-12 NOTE — Progress Notes (Signed)
CRITICAL VALUE ALERT  Critical Value:  Sodium = 161  Date & Time Notied: 09/12/2019 at 2000  Provider Notified: Night Intern - Intern Rosita Fire  Orders Received/Actions take

## 2019-09-12 NOTE — Progress Notes (Signed)
Critical Lab value received on patient. Na- 161 collected at 0953. Day resident Dr. Sandi Carne and primary RN, Mariane Baumgarten notified.

## 2019-09-12 NOTE — Progress Notes (Signed)
Family Medicine Teaching Service Daily Progress Note Intern Pager: [phone]  Patient name: [name] Medical record number: [id number] Date of birth: Jan 16, 1924 Age: 86 y.o. Gender: male  Primary Care Provider: Leeanne Rio, MD Consultants: None Code Status: Full  [name]:  09/16/2019 admitted  Assessment and Plan: [name] is a 86 y.o. male presenting with continuation of altered mental status . PMH is significant for recent Covid diagnosis s/p hospitalization with remdesivir (4 doses), HTN, DVT, CVA, systolic and diastolic heart failure, chronic ITP, CKD  COVID-19 infection vs bacterial PNA Patient tested positive for COVID-19 on August 24, 2019.  He was hospitalized for increased shortness of breath and confusion.  He had no oxygen requirement but was admitted due to age and comorbidities.  He was started on remdesivir which he completed on 2/24 due to oxygen saturations dropping to the mid 80s on 2 L nasal cannula.  He received a total of 4 doses of remdesivir.  He was not started on Decadron because he had had a previous reaction to prednisone.  Patient has reportedly had a decrease in cough since discharge and has been afebrile.  WBC on admission was 5.1.  Chest x-ray on admission showed worsening opacities.  Physical exam showed decreased breath sounds in all lung fields but no crackles were heard.  Most likely cause of worsening consolidation is pneumonia, COVID-19 vs CAP.  May also be related to patient's CHF although patient appears hypovolemic, hemoconcentration apparent on labs (chronic ITP baseline plt 100-130, 190 on admission). Pt unable to tolerate PO at home or in hospital due to mentation, free water deficit 4.9L. Patient is currently requiring 3L O2 via [place] to maintain sats >92%. Plan to deescalate abx tomorrow based on clinical picture. -Daily CBC, CMP, ferritin, crp, d-dimer -Oxygen therapy 2 L nasal cannula if O2 drops  lower than 92% -Continuous pulse ox and cardiac monitoring -Cefepime per pharmacy -Vancomycin per pharmacy - family declined recommendations for decadron  Afib  Tachycardia Afib chronic problem,home medications include Xarelto 15 mg daily and Metoprolol 12.5 mg twice daily. Received one dose of dilt in ED yesterday at 1430. Overnight patient tachycardic to 130s. Troponins elevated 231>252>229, discontinued checking trops. EKG reveals afib with RVR. Metoprolol converted from oral to IV due to patient's mentation (unable to take oral meds). Received 5mg  metoprolol IV last night at 2200 and again this morning at 0648. Will continue to monitor for rate control, most recently 110-115. As patient is unable to take oral medications and has an AKI, will discontinue xarelto and start lovenox with renal dose adjustment. -Discontinue Xarelto -Start lovenox per pharmacy consult -Continue Metoprolol 12.5 mg BID  Hypovolemic hypernatremia On admission patient's sodium was 159.  BUN-58, creatinine-1.94 (per chart review appears to be mildly above baseline) patient was reportedly unable to eat or drink since discharge on 2/27.  Overnight pt received 1.5L NS and 1L 1/2NS. BMP at 0100 NA 159. Awaiting BMP 0600, will obtain BMP q4 hours after that. Start D5 IVF at 19mL/hr. Free water deficit 4.9L. -D5 IVF -BMP q4h  AKI Patient's creatinine on admission was 1.97, today 1.81.  Creatinine over the past year have ranged from 1.72-2.81. -q4h BMP -D5 IVF -Avoid nephrotoxic agents  HTN Chronic. Stable. Initial BP in ED was 142/85. Home medications include Metoprolol 12.5 mg BID, Torsemide 20 mg daily, Potassium 20 meq daily. -Continue Metoprolol IV -Holding torsemide at this time due to dehydration, AKI  Chronic Back Pain  Per chart review patient reported herniated disc at his last admission. Last received 02/04. Home medications Ibuprofen 200 mg daily, Robaxin 500 mg qhs and Lyrica 75 mg BID.On exam no  pain on palpation. -Holding NSAIDs at this time due to AKI -Holding Robaxin and Lyrica at this time due to altered mental status, unable to take oral meds  HLD Chronic. Stable. Lipid Profile(11/20) wnl. Home medication pravastatin 20 mg daily -hold Pravastatin 20 mg daily due to mentation, pt unable to take oral meds  H/O Rt leg DVT Chronic. Stable.Denies any leg pain or swelling or shortness of breath. Home medication include Xarelto 15 mg daily. Patient unable to take oral medications due to mentation. Will switch to lovenox with renal dose adjustment. -discontinue Xarelto 15 mg daily -Start lovenox per pharmacy consult -PT/OT eval  CAD s/p stent placement Chronic. Stable.  Patient has history of MI.  He is aphasic at this time so unsure if any recent chest pain.   EKG showed A. fib with rapid ventricular rate.  Home medication include Metoprolol 12.5 mg BID, switched to IV due to not able to take oral meds. Nitroglycerine 0.4 mg s/l although patient reports has not had to use in years. -Continuous cardiac monitoring. -Continue Metoprolol IV - repeat ECG after some fluids given -HoldhomeNitroglycerinefor prn chest pain  BPH Chronic. Stable. Home medication Flomax 0.4mg  daily. Patient unable to tolerate oral medications due to mentation. -Continue to monitor  Chronic ITP Chronic. Stable. On admission platelets 190, today 150. Baseline 110-133. Patient's daughter denies any recent active bleeding -Continue to monitor -CBC in am  GERD Chronic. Stable. Home medications include Pepcid 20 mg daily and Protonix 40 mg twice daily. Patient unable to take oral medications due to mental status -Holding Pepcid at this time   FEN/GI: Diet NPO except sips w/ meds PPx: lovenox per pharmacy consult  Disposition: to med-surg pending medical work up and stabilization  Subjective:  Patient alert, but oriented x0, non verbal. Appears very uncomfortable.  Objective: Temp:   [97.4 F (36.3 C)-98.3 F (36.8 C)] 97.4 F (36.3 C) (03/03 0548) Pulse Rate:  [25-146] 100 (03/03 0739) Resp:  [19-36] 19 (03/03 0739) BP: (100-142)/(60-96) 100/64 (03/03 0739) SpO2:  [49 %-98 %] 94 % (03/03 0739) Weight:  [71.8 kg-77 kg] 71.8 kg (03/03 0006) Physical Exam: General: older man, in mild distress, resting in bed, alert but not oriented, unable to follow commands or take anything by mouth Cardiovascular: irregular rate, tachycardic to 110-115. No m/r/g Respiratory: Unable to appreciate lung sounds on exam due to patient inability to follow commands. No increased WOB, satting well on 3L O2 [place]. Abdomen: soft, NT, ND, normal BS present Extremities: no edema in bilateral LE, warm, dry, several excoriations present on ant L shin, no erythema, warmth or pustulance   Laboratory: Recent Labs  Lab 09/08/19 0131 09/12/2019 1351 09/12/19 0101  WBC 4.4 5.1 5.8  HGB 14.8 16.7 15.7  HCT 46.9 55.7* 51.2  PLT 119* 190 150   Recent Labs  Lab 09/07/19 0448 09/07/19 0448 09/08/19 0131 09/08/19 0131 09/27/2019 1351 09/16/2019 2045 09/12/19 0101  NA 147*   < > 148*   < > 159* 162* 159*  K 4.1   < > 4.3   < > 4.1 3.9 3.7  CL 107   < > 107   < > 118* 121* 124*  CO2 29   < > 27   < > 26 27 23   BUN 40*   < > 43*   < >  58* 56* 56*  CREATININE 1.99*   < > 1.85*   < > 1.94* 1.85* 1.81*  CALCIUM 8.8*   < > 9.0   < > 9.5 9.0 8.9  PROT 6.2*  --  6.2*  --  7.5  --   --   BILITOT 1.8*  --  1.7*  --  2.4*  --   --   ALKPHOS 80  --  77  --  90  --   --   ALT 25  --  26  --  25  --   --   AST 64*  --  59*  --  43*  --   --   GLUCOSE 136*   < > 137*   < > 138* 139* 137*   < > = values in this interval not displayed.    Imaging/Diagnostic Tests: CT Head Wo Contrast  Result Date: 10/10/2019 CLINICAL DATA:  86 year old male with encephalopathy. EXAM: CT HEAD WITHOUT CONTRAST TECHNIQUE: Contiguous axial images were obtained from the base of the skull through the vertex without intravenous  contrast. COMPARISON:  Head CT dated 05/23/2019. FINDINGS: Evaluation of this exam is limited due to motion artifact. Brain: There is moderate age-related atrophy and chronic microvascular ischemic changes. Areas of old infarct and encephalomalacia noted in the left temporal lobe, right parietal and right frontal lobes. There is no acute intracranial hemorrhage. No mass effect or midline shift. No extra-axial fluid collection. Vascular: No hyperdense vessel or unexpected calcification. Skull: Normal. Negative for fracture or focal lesion. Sinuses/Orbits: Small bilateral maxillary sinus retention cysts or polyps. No air-fluid level. The mastoid air cells are clear. Other: None IMPRESSION: 1. No acute intracranial pathology. 2. Age-related atrophy and chronic microvascular ischemic changes and bilateral old infarcts. Electronically Signed   By: Anner Crete M.D.   On: 09/14/2019 15:59   DG Chest Left Decubitus  Result Date: 10/10/2019 CLINICAL DATA:  86 year old male with COVID-19. EXAM: CHEST - LEFT DECUBITUS COMPARISON:  Chest radiograph dated 09/29/2019. FINDINGS: Bilateral pulmonary densities left greater right as seen previously. No pneumothorax or significant layering effusion. IMPRESSION: Bilateral pulmonary densities left greater right. Electronically Signed   By: Anner Crete M.D.   On: 09/23/2019 20:31   DG Chest Portable 1 View  Result Date: 10/01/2019 CLINICAL DATA:  Cough, COVID-19 EXAM: PORTABLE CHEST 1 VIEW COMPARISON:  Radiograph 09/05/2019 FINDINGS: There are increasing bibasilar opacities in both lungs but with overall diminished lung volumes when compared to prior studies. Obscuration of the bilateral hemidiaphragms and costophrenic sulci may reflect some layering effusion. Mild central venous congestion and fissural thickening is present as well. Cardiac silhouette appears slightly enlarged this may be accentuated by portable technique. Extensive vascular calcium in the coronary stent  is present atherosclerotic calcification of the thoracic aorta is seen as well. No acute osseous or soft tissue abnormality. Degenerative changes are present in the imaged spine and shoulders. IMPRESSION: Increasing opacity some features suggesting CHF with pulmonary edema and effusions. Underlying infection may be present as well in the setting of COVID-19 diagnosis. Electronically Signed   By: Lovena Le M.D.   On: 09/21/2019 15:11   Gladys Damme, MD 09/12/2019, 8:04 AM PGY-1, [place] Intern pager: [phone], text pages welcome

## 2019-09-12 NOTE — Progress Notes (Addendum)
ANTICOAGULATION CONSULT NOTE - Initial Consult  Pharmacy Consult for Enoxaparin Indication: atrial fibrillation  Allergies  Allergen Reactions  . Gabapentin Other (See Comments)    Confusion and caused falls  . Tape Other (See Comments)    SKIN IS THIN AND TEARS EASILY!! PLEASE USE AN ALTERNATIVE!!  . Eliquis [Apixaban] Swelling and Other (See Comments)    Causes swollen ankles and blot clot in eyes  . Prednisone Other (See Comments)    "Made me not feel right when I took it"  . Rosuvastatin Other (See Comments)    "Makes me ache"    Patient Measurements: Height: 5\' 11"  (180.3 cm) Weight: 158 lb 4.6 oz (71.8 kg) IBW/kg (Calculated) : 75.3  Vital Signs: Temp: 99.6 F (37.6 C) (03/03 1159) Temp Source: Axillary (03/03 1159) BP: 91/57 (03/03 1159) Pulse Rate: 104 (03/03 1159)  Labs: Recent Labs    10/01/2019 1351 10/01/2019 1351 09/30/2019 2045 10/09/2019 2045 10/01/2019 2245 09/12/19 0101 09/12/19 0431 09/12/19 0953  HGB 16.7  --   --   --   --  15.7  --   --   HCT 55.7*  --   --   --   --  51.2  --   --   PLT 190  --   --   --   --  150  --   --   CREATININE 1.94*   < > 1.85*  --   --  1.81*  --  1.74*  TROPONINIHS  --   --  231*   < > 252* 229* 175*  --    < > = values in this interval not displayed.    Estimated Creatinine Clearance: 26.9 mL/min (A) (by C-G formula based on SCr of 1.74 mg/dL (H)).   Medical History: Past Medical History:  Diagnosis Date  . Abnormal CT of the chest 07/21/2016   January 2018 IMPRESSION: 1. Residual patchy ground-glass opacities throughout both lungs at the resolved areas of consolidation from the 05/16/2016 chest CT, consistent with a resolving infectious or inflammatory process. 2. Bandlike opacity in the anterior right upper lobe is new since 05/16/2016, was probably present on 06/17/2016 chest radiograph, favor evolving postinfectious/postinflammatory   . Acute anemia 05/15/2019  . AKI (acute kidney injury) ([place])   . Allergic  rhinitis 01/11/2019  . Anemia 09/16/2011  . Benign prostate hyperplasia 10/04/2011  . Bladder neck obstruction 10/04/2011  . Cerebrovascular accident (CVA) ([place]) 08/09/2016  . Chronic combined systolic (congestive) and diastolic (congestive) heart failure ([place])   . Chronic ITP (idiopathic thrombocytopenia) ([facility])   . Chronic kidney disease, stage 4 (severe) ([place]) 05/15/2019  . Coronary atherosclerosis of native coronary artery    a. Anterior STEMI 2009 s/p BMSx2 to mid & prox LAD and angioplasty to distal LAD. total RCA with collaterals, moderate Cx plaquing. a. NSTEMI 02/2018 - chronically occluded RCA, patent LAD stents and severe stenosis in the obtuse marginal branch treated with a drug eluting stent.  [name] COVID-19 09/05/2019  . Degenerative disk disease 12/20/2010   Sees Dr. Eddie Dibbles.  Has had MRI.   . Enlarged prostate with lower urinary tract symptoms (LUTS) 06/09/2011  . Gastritis and gastroduodenitis   . GERD (gastroesophageal reflux disease) 01/11/2019  . GI bleed 05/15/2019  . Gross hematuria   . Hemoptysis 05/16/2016  . History of GI bleed 03/17/2018   Admitted 03/17/18 with acute GI bleeding. EGD showed gastritis but no active bleeding. Aspirin stopped and Plavix and Xareltro continued.   [name] HTN (hypertension)   .  Hyperlipidemia 04/23/2013  . Hypotension   . Iron deficiency anemia   . Ischemic cardiomyopathy   . Microscopic hematuria 09/28/2014  . Myocardial infarct ([place]) 03/21/2008  . NSTEMI (non-ST elevated myocardial infarction) ([place]) 05/16/2016  . NSVT (nonsustained ventricular tachycardia) ([place])    a. Per DC summary from time of STEMI 2009.  . Orthostatic hypotension 11/26/2016  . PAD (peripheral artery disease) ([place]) 08/13/2016  . Paroxysmal atrial flutter ([place])   . Persistent atrial fibrillation ([place])   . Phlebitis and thrombophlebitis of superficial vessels of lower extremities   . Pure hypercholesterolemia    a. Has not tolerated statins in the past.  . Right leg DVT ([place])    a. Dx  01/2014.  [name] Sinus bradycardia    a. By prior event monitor.   . Skin tear of forearm without complication 19/62/2297  . Symptomatic anemia   . Thrombocytopenia ([place])   . Urinary retention 06/20/2011    Medications:  Scheduled:  . enoxaparin (LOVENOX) injection  60 mg Subcutaneous Q24H  . metoprolol tartrate  5 mg Intravenous Q6H  . pantoprazole (PROTONIX) IV  40 mg Intravenous BID  . tamsulosin  0.4 mg Oral QPC supper   Infusions:  . ceFEPime (MAXIPIME) IV    . dextrose 100 mL/hr at 09/12/19 0752  . [START ON 09/13/2019] vancomycin      Assessment: [name] is a 86 y.o. male who presented with continuation of altered mental status . PMH is significant for recent Covid diagnosis s/p hospitalization with remdesivir, HTN, DVT, CVA, systolic and diastolic heart failure, chronic ITP, CKD, and Afib (on xarelto PTA). Pt is unable to take PO medications due to mental status. Pharmacy has been consulted to dose Lovenox. Primary team is avoiding heparin due to sodium load with heparin infusion given patient's sodium of 161.  Patient's last dose of xarelto was 09/10/2019 at 1730. D-dimer is currently 1.65. Troponins are decreasing 252>>175. Patient's renal function is improving. CrCl is ~25-30.  Goal of Therapy:  Anti-Xa level 0.6-1 units/ml 4hrs after LMWH dose given Monitor platelets by anticoagulation protocol: Yes   Plan:  Give renally adjusted lovenox at 60 mg daily (slightly lower than 1 mg/kg/day due to age and AKI) Would eventually recommend obtaining anti-Xa level if patient remains on enoxaparin. Will not obtain anti-Xa level in the next few days because it will likely be falsely elevated from recent xarelto exposure.  Sherren Kerns, PharmD PGY1 Acute Care Pharmacy Resident 09/12/2019,12:21 PM

## 2019-09-12 NOTE — Progress Notes (Signed)
Initial Nutrition Assessment   RD working remotely.  DOCUMENTATION CODES:   Not applicable  INTERVENTION:  Diet advancement when appropriate per MD and team.  RD to continue to monitor.   NUTRITION DIAGNOSIS:   Inadequate oral intake related to inability to eat as evidenced by NPO status.  GOAL:   Patient will meet greater than or equal to 90% of their needs  MONITOR:   Diet advancement, Skin, Weight trends, I & O's, Labs  REASON FOR ASSESSMENT:   Malnutrition Screening Tool    ASSESSMENT:   86 y.o. male presenting with continuation of altered mental status . PMH is significant for recent Covid diagnosis s/p hospitalization with remdesivir (4 doses), HTN, DVT, CVA, systolic and diastolic heart failure, chronic ITP, CKD4. Pt with COVID-19 infection vs bacterial PNA  Per RN, pt confused and nonverbal. MD reports pt unable to take PO since 2/27 due to AMS. Pt currently NPO with plans for swallow evaluation once mentation improves. Per weight records, pt with a 10% weight loss within a 1 month time frame, which is significant for time frame. RD to continue to monitor and order nutritional supplements once diet advances as appropriate.   Unable to complete Nutrition-Focused physical exam at this time.   Labs and medications reviewed. Sodium elevated at 161. Chloride elevated at 122.   Diet Order:   Diet Order            Diet NPO time specified Except for: Sips with Meds  Diet effective now              EDUCATION NEEDS:   Not appropriate for education at this time  Skin:  Skin Assessment: Reviewed RN Assessment  Last BM:  Unknown  Height:   Ht Readings from Last 1 Encounters:  10/04/2019 5\' 11"  (1.803 m)    Weight:   Wt Readings from Last 1 Encounters:  09/12/19 71.8 kg    BMI:  Body mass index is 22.08 kg/m.  Estimated Nutritional Needs:   Kcal:  1700-1850  Protein:  75-85 grams  Fluid:  >/= 1.7 L/day    Corrin Parker, MS, RD, LDN RD  pager number/after hours weekend pager number on Amion.

## 2019-09-12 NOTE — Progress Notes (Signed)
FPTS Interim Progress Note  Update regarding patient's missing BMP ordered for 1400.  Nurse was contacted by Dr. Chauncey Reading, and reported that he did not know why the lab had not been obtained.  He later reported that he called phlebotomy to attempt to have them draw the lab and was told that the lab had been cancelled.  Lab still in place at this time.  Called lab and spoke with Mason, who was able to look into the order and state that order was cancelled by a phlebotomist as a BMP had been drawn at 1000.  Salome Arnt stated that proper procedure would have been for phlebotomist to notify the nurse and ask if BMP is still needed despite having one at 1000 before cancellation.  Verified with nurse, Rasheen, that he was not contacted to ask if cancellation for this lab was acceptable.  He reports calling phlebotomy multiple times in an attempt to get BMP drawn.  This provider has personally called phlebotomy 3 times without answer.  Will continue to attempt to have BMP drawn as this is crucial to the patient's care given his significantly elevated sodium.   [place], DO 09/12/2019, 6:34 PM PGY-2, [facility] Medicine Service pager [phone]

## 2019-09-12 NOTE — Consult Note (Signed)
Consultation Note Date: 09/12/2019   Patient Name: [name]  DOB: [birth year]  MRN: [id number]  Age / Sex: 86 y.o., male  PCP: Leeanne Rio, MD Referring Physician: Kinnie Feil, MD  Reason for Consultation: Establishing goals of care  HPI/Patient Profile: 86 y.o. male  with past medical history of HTN, paroxysmal atrial fibrillation, MI, NSTEMI, DVT, CVA, systolic and diastolic heart failure, ischemic cardiomyopathy with EF 30-35%, chronic ITP, CKD, BPH, recent covid-19 pneumonia hospitalized from 2/24-2/27 admitted on 09/13/2019 with weakness, AMS, and poor oral intake. Hospital admission for multifocal pneumonia, antibiotics initiated. Patient with delirium likely multifactorial, encephalomalacia on neuroimaging/increased risk for underlying dementia. Also dehydration with hypernatremia secondary to poor oral intake. Palliative medicine consultation for goals of care.   Clinical Assessment and Goals of Care:  I have reviewed medical records and discussed with Dr. Chauncey Reading and RN. Per RN, patient is confused and agitated. Pulling at oxygen and pulled out IV, requiring safety mitts. HR elevated with soft BP's.    Spoke with daughter, [name] via telephone to discuss goals of care.   I introduced Palliative Medicine as specialized medical care for people living with serious illness. It focuses on providing relief from the symptoms and stress of a serious illness. The goal is to improve quality of life for both the patient and the family.  [name] is familiar with palliative/hospice philosophy as they cared for their mother with progressive dementia. She was receiving home hospice services before she passed away in 10/11/2015. Patient and his wife were married for 63 years!  Mr. Stripling was a [employer]. [name] shares that he "built half of [place]" and well known. The family [place] in  [place]. [place] and 3/4 children live at the [place] the [place]. Four children are very support with routine visits 2+ times a day. Last Tuesday (09/04/19) patient was still driving and walked into the ER on 2/24. [name] shares that he discharged on 2/27 in the fetal position. He has had previous hospital delirium, so the children were expecting delirium (especially since he was unable to have a visitor) but were surprised to see how quickly he declined following admission. He unfortunately continued to decline cognitively/nutritionally/functionally at home.   Discussed events leading up to admission and course of hospitalization including diagnoses, interventions, plan of care. Discussed CT scan results with no acute changes but underlying atrophy and chronic microvascular changes/old infarcts likely contributing to possible vascular dementia/delirium and overall decline. Discussed medications including consideration of decadron and/or low-dose Seroquel or Zyprexa for delirium. Defer to attending.   I attempted to elicit values and goals of care important to the patient and family. Advanced directives, concepts specific to code status, artifical feeding and hydration were discussed. [name] reports that her and her brother are POA's but children are very close and make decisions together. [name] shares that 'he does not want to live in the condition he was when he came home.' She is very clear  that her father would not wish for prolonged medical interventions if failing to show improvement. He would not want to "suffer."   We briefly discussed feeding tube and concern that this would not go well with his current agitation and already pulling at oxygen and IV, requiring safety mitts.  Discussed code status. [name] shares the decision for FULL code status on admission with their wish to give him a chance and also fear that he would not receive adequate care if DNR code status.  Also fear that the children would not want him to die alone. [name] is very realistic that if this happened, the children would only keep him on a ventilator long enough "to tell him goodbye." She jokes that he would haunt them if interventions were prolonged. Encouraged ongoing discussions with her siblings.   Questions and concerns were addressed. PMT contact information given. Reassured of ongoing support from PMT.     SUMMARY OF RECOMMENDATIONS    Continue FULL code/FULL scope treatment  Ongoing palliative discussions pending clinical course.  Daughter acknowledges that patient would not wish for prolonged heroic interventions. If patient fails to show improvement despite aggressive medical management, daughter open to further palliative/hospice discussions as the patient's wife received home hospice services and passed away at home. Very supportive family than can provide 24/7 care in the home.   PMT will follow. Updated attending on this conversation.   Code Status/Advance Care Planning:  Full code: ongoing discussions for medical recommendation for DNR/DNI with underlying age, frailty, acute illness, and chronic conditions. Fear that aggressive measures at EOL would lead to pain and suffering and poor chance of meaningful survival with acceptable quality of life.   Symptom Management:   Consider decadron (Daughter agreeable. Defer to primary)  Consider low-dose Seroquel or Zyprexa for delirium   Palliative Prophylaxis:   Aspiration, Delirium Protocol, Frequent Pain Assessment, Oral Care and Turn Reposition  Psycho-social/Spiritual:   Desire for further Chaplaincy support: yes  Additional Recommendations: Caregiving  Support/Resources  Prognosis:   Guarded: high risk for decompensation  Discharge Planning: To Be Determined      Primary Diagnoses: Present on Admission: . Delirium . Chronic kidney disease, stage 4 (severe) ([place]) . Coronary atherosclerosis of native  coronary artery . Persistent atrial fibrillation ([place]) . Heart failure with reduced ejection fraction ([place]) . (Resolved) COVID-19 . Multifocal pneumonia . Elevated troponin . Dehydration with hypernatremia . Aspiration into airway, Possible . Permanent atrial fibrillation with RVR ([place])   I have reviewed the medical record, interviewed the patient and family, and examined the patient. The following aspects are pertinent.  Past Medical History:  Diagnosis Date  . Abnormal CT of the chest 07/21/2016   January 2018 IMPRESSION: 1. Residual patchy ground-glass opacities throughout both lungs at the resolved areas of consolidation from the 05/16/2016 chest CT, consistent with a resolving infectious or inflammatory process. 2. Bandlike opacity in the anterior right upper lobe is new since 05/16/2016, was probably present on 06/17/2016 chest radiograph, favor evolving postinfectious/postinflammatory   . Acute anemia 05/15/2019  . AKI (acute kidney injury) ([place])   . Allergic rhinitis 01/11/2019  . Anemia 09/16/2011  . Benign prostate hyperplasia 10/04/2011  . Bladder neck obstruction 10/04/2011  . Cerebrovascular accident (CVA) ([place]) 08/09/2016  . Chronic combined systolic (congestive) and diastolic (congestive) heart failure ([place])   . Chronic ITP (idiopathic thrombocytopenia) ([facility])   . Chronic kidney disease, stage 4 (severe) ([place]) 05/15/2019  . Coronary atherosclerosis of native coronary artery    a.  Anterior STEMI 2009 s/p BMSx2 to mid & prox LAD and angioplasty to distal LAD. total RCA with collaterals, moderate Cx plaquing. a. NSTEMI 02/2018 - chronically occluded RCA, patent LAD stents and severe stenosis in the obtuse marginal branch treated with a drug eluting stent.  [name] COVID-19 09/05/2019  . Degenerative disk disease 12/20/2010   Sees Dr. Eddie Dibbles.  Has had MRI.   . Enlarged prostate with lower urinary tract symptoms (LUTS) 06/09/2011  . Gastritis and gastroduodenitis   . GERD (gastroesophageal  reflux disease) 01/11/2019  . GI bleed 05/15/2019  . Gross hematuria   . Hemoptysis 05/16/2016  . History of GI bleed 03/17/2018   Admitted 03/17/18 with acute GI bleeding. EGD showed gastritis but no active bleeding. Aspirin stopped and Plavix and Xareltro continued.   [name] HTN (hypertension)   . Hyperlipidemia 04/23/2013  . Hypotension   . Iron deficiency anemia   . Ischemic cardiomyopathy   . Microscopic hematuria 09/28/2014  . Myocardial infarct ([place]) 03/21/2008  . NSTEMI (non-ST elevated myocardial infarction) ([place]) 05/16/2016  . NSVT (nonsustained ventricular tachycardia) ([place])    a. Per DC summary from time of STEMI 2009.  . Orthostatic hypotension 11/26/2016  . PAD (peripheral artery disease) ([place]) 08/13/2016  . Paroxysmal atrial flutter ([place])   . Persistent atrial fibrillation ([place])   . Phlebitis and thrombophlebitis of superficial vessels of lower extremities   . Pure hypercholesterolemia    a. Has not tolerated statins in the past.  . Right leg DVT ([place])    a. Dx 01/2014.  [name] Sinus bradycardia    a. By prior event monitor.   . Skin tear of forearm without complication 56/38/7564  . Symptomatic anemia   . Thrombocytopenia ([place])   . Urinary retention 06/20/2011   Social History   Socioeconomic History  . Marital status: Married    Spouse name: Not on file  . Number of children: Not on file  . Years of education: Not on file  . Highest education level: Not on file  Occupational History  . Not on file  Tobacco Use  . Smoking status: Never Smoker  . Smokeless tobacco: Never Used  Substance and Sexual Activity  . Alcohol use: No  . Drug use: No  . Sexual activity: Not on file  Other Topics Concern  . Not on file  Social History Narrative   Lives in [place]. Wife has died. Lives next door to his daughter [name]. Also has a very supportive daughter, [name] who lives out of town. 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 Denies tobacco, alcohol, or drug use ever in  lifetime.   Social Determinants of Health   Financial Resource Strain:   . Difficulty of Paying Living Expenses: Not on file  Food Insecurity:   . Worried About Charity fundraiser in the Last Year: Not on file  . Ran Out of Food in the Last Year: Not on file  Transportation Needs:   . Lack of Transportation (Medical): Not on file  . Lack of Transportation (Non-Medical): Not on file  Physical Activity:   . Days of Exercise per Week: Not on file  . Minutes of Exercise per Session: Not on file  Stress:   . Feeling of Stress : Not on file  Social Connections:   . Frequency of Communication with Friends and Family: Not on file  . Frequency of Social Gatherings with Friends and Family: Not on file  . Attends Religious Services: Not on file  .  Active Member of Clubs or Organizations: Not on file  . Attends Archivist Meetings: Not on file  . Marital Status: Not on file   Family History  Problem Relation Age of Onset  . Cancer Sister   . Cancer Sister    Scheduled Meds: . enoxaparin (LOVENOX) injection  60 mg Subcutaneous Q24H  . metoprolol tartrate  5 mg Intravenous Q6H  . pantoprazole (PROTONIX) IV  40 mg Intravenous BID  . tamsulosin  0.4 mg Oral QPC supper   Continuous Infusions: . ceFEPime (MAXIPIME) IV    . dextrose 100 mL/hr at 09/12/19 0752  . [START ON 09/13/2019] vancomycin     PRN Meds:.senna Medications Prior to Admission:  Prior to Admission medications   Medication Sig Start Date End Date Taking? Authorizing Provider  acetaminophen (TYLENOL) 325 MG tablet Take 325-650 mg by mouth every 6 (six) hours as needed for mild pain or headache.   Yes [provider]  famotidine (PEPCID) 20 MG tablet Take 1 tablet (20 mg total) by mouth 2 (two) times daily. 07/04/19  Yes Leeanne Rio, MD  ferrous sulfate 325 (65 FE) MG tablet Take 1 tablet (325 mg total) by mouth 2 (two) times daily with a meal. 06/13/19  Yes Leeanne Rio, MD  fluticasone  (FLONASE) 50 MCG/ACT nasal spray PLACE 2 SPRAYS INTO BOTH NOSTRILS DAILY AS NEEDED FOR ALLERGIES OR RHINITIS. Patient taking differently: Place 1 spray into both nostrils daily as needed for allergies or rhinitis.  08/28/19  Yes Leeanne Rio, MD  KLOR-CON M20 20 MEQ tablet TAKE 2 TABLETS BY MOUTH DAILY Patient taking differently: Take 20 mEq by mouth daily.  07/03/19  Yes Burnell Blanks, MD  loratadine (CLARITIN) 10 MG tablet Take 10 mg by mouth daily as needed for allergies.    Yes [provider]  methocarbamol (ROBAXIN) 500 MG tablet Take 500 mg by mouth at bedtime.  05/10/19  Yes [provider]  metoprolol tartrate (LOPRESSOR) 25 MG tablet Take 0.5 tablets (12.5 mg total) by mouth 2 (two) times daily. 05/04/19  Yes Burnell Blanks, MD  nitroGLYCERIN (NITROSTAT) 0.4 MG SL tablet Place 0.4 mg under the tongue every 5 (five) minutes as needed for chest pain.   Yes [provider]  pantoprazole (PROTONIX) 40 MG tablet TAKE 1 TABLET BY MOUTH TWICE A DAY Patient taking differently: Take 40 mg by mouth 2 (two) times daily before a meal.  07/20/19  Yes Leeanne Rio, MD  Polyethyl Glyc-Propyl Glyc PF (SYSTANE ULTRA PF) 0.4-0.3 % SOLN Place 1-2 drops into both eyes every morning.   Yes [provider]  pravastatin (PRAVACHOL) 20 MG tablet Take 1 tablet (20 mg total) by mouth daily at 6 PM. 01/11/19  Yes Leeanne Rio, MD  pregabalin (LYRICA) 75 MG capsule Take 75 mg by mouth 2 (two) times daily.   Yes [provider]  Rivaroxaban (XARELTO) 15 MG TABS tablet Take 1 tablet (15 mg total) by mouth daily with supper. 04/30/19  Yes Burnell Blanks, MD  senna (SENOKOT) 8.6 MG TABS tablet Take 1 tablet (8.6 mg total) by mouth daily as needed for mild constipation. Patient taking differently: Take 1 tablet by mouth every morning.  04/14/18  Yes Nuala Alpha, DO  tamsulosin (FLOMAX) 0.4 MG CAPS capsule Take 1 capsule (0.4 mg  total) by mouth daily after supper. To help urine stream Patient taking differently: Take 0.4 mg by mouth. To help urine stream 08/23/19  Yes  Zenia Resides, MD  urea (CARMOL) 10 % cream Apply topically as needed. Patient taking differently: Apply 1 application topically as needed (on dry areas of the legs).  07/23/19  Yes Guadalupe Dawn, MD  torsemide (DEMADEX) 20 MG tablet Take 1 tablet by mouth daily, may take 1 extra tablet daily only as needed for swelling or weight gain of 3 lbs in one day or 5 lbs in a few days Patient taking differently: Take 20 mg by mouth See admin instructions. Take 20 mg by mouth in the morning and an additional 20 mg once daily as needed for swelling or weight gain of 3 lbs in one day or 5 lbs in a few days 06/05/19   Charlie Pitter, PA-C  traMADol (ULTRAM) 50 MG tablet Take 50 mg by mouth daily as needed (for pain).  06/13/19   [provider]   Allergies  Allergen Reactions  . Gabapentin Other (See Comments)    Confusion and caused falls  . Tape Other (See Comments)    SKIN IS THIN AND TEARS EASILY!! PLEASE USE AN ALTERNATIVE!!  . Eliquis [Apixaban] Swelling and Other (See Comments)    Causes swollen ankles and blot clot in eyes  . Prednisone Other (See Comments)    "Made me not feel right when I took it"  . Rosuvastatin Other (See Comments)    "Makes me ache"   Review of Systems  Unable to perform ROS: Acuity of condition   Physical Exam Vitals and nursing note reviewed.    Vital Signs: BP 103/66 (BP Location: Right Arm)   Pulse (!) 108   Temp 98.6 F (37 C) (Axillary)   Resp 19   Ht 5\' 11"  (1.803 m)   Wt 71.8 kg   SpO2 91%   BMI 22.08 kg/m  Pain Scale: Faces   Pain Score: Asleep   SpO2: SpO2: 91 % O2 Device:SpO2: 91 % O2 Flow Rate: .O2 Flow Rate (L/min): 4 L/min  IO: Intake/output summary:   Intake/Output Summary (Last 24 hours) at 09/12/2019 1349 Last data filed at 09/12/2019 1200 Gross per 24 hour  Intake 917.63 ml  Output  --  Net 917.63 ml    LBM:   Baseline Weight: Weight: 77 kg Most recent weight: Weight: 71.8 kg     Palliative Assessment/Data: PPS 20%   Flowsheet Rows     Most Recent Value  Intake Tab  Referral Department  Hospitalist  Unit at Time of Referral  Cardiac/Telemetry Unit  Palliative Care Primary Diagnosis  Sepsis/Infectious Disease  Palliative Care Type  New Palliative care  Reason for referral  Clarify Goals of Care  Date first seen by Palliative Care  09/12/19  Clinical Assessment  Palliative Performance Scale Score  20%  Psychosocial & Spiritual Assessment  Palliative Care Outcomes  Patient/Family meeting held?  Yes  Who was at the meeting?  daughter [name])  [place] goals of care, Counseled regarding hospice, Provided end of life care assistance, Provided psychosocial or spiritual support, Improved non-pain symptom therapy, ACP counseling assistance      Time In: 1517 Time Out: 1400 Time Total: 37min Greater than 50%  of this time was spent counseling and coordinating care related to the above assessment and plan.  The above conversation was completed via telephone due to visitor restrictions during COVID-19 pandemic. Thorough chart review and discussion with multidisciplinary team was completed as part of assessment. No physical examination was performed.   Signed by:  Ihor Dow,  DNP, FNP-C Palliative Medicine Team  Phone: [phone] Fax: [phone]   Please contact Palliative Medicine Team phone at [phone] for questions and concerns.  For individual provider: See Shea Evans

## 2019-09-13 LAB — BASIC METABOLIC PANEL
Anion gap: 10 (ref 5–15)
Anion gap: 8 (ref 5–15)
Anion gap: 9 (ref 5–15)
Anion gap: 8 (ref 5–15)
BUN: 40 mg/dL — ABNORMAL HIGH (ref 8–23)
BUN: 35 mg/dL — ABNORMAL HIGH (ref 8–23)
BUN: 33 mg/dL — ABNORMAL HIGH (ref 8–23)
BUN: 31 mg/dL — ABNORMAL HIGH (ref 8–23)
CO2: 24 mmol/L (ref 22–32)
CO2: 26 mmol/L (ref 22–32)
CO2: 25 mmol/L (ref 22–32)
CO2: 24 mmol/L (ref 22–32)
Calcium: 8.8 mg/dL — ABNORMAL LOW (ref 8.9–10.3)
Calcium: 8.6 mg/dL — ABNORMAL LOW (ref 8.9–10.3)
Calcium: 8.5 mg/dL — ABNORMAL LOW (ref 8.9–10.3)
Calcium: 8.4 mg/dL — ABNORMAL LOW (ref 8.9–10.3)
Chloride: 120 mmol/L — ABNORMAL HIGH (ref 98–111)
Chloride: 119 mmol/L — ABNORMAL HIGH (ref 98–111)
Chloride: 115 mmol/L — ABNORMAL HIGH (ref 98–111)
Chloride: 118 mmol/L — ABNORMAL HIGH (ref 98–111)
Creatinine, Ser: 1.61 mg/dL — ABNORMAL HIGH (ref 0.61–1.24)
Creatinine, Ser: 1.65 mg/dL — ABNORMAL HIGH (ref 0.61–1.24)
Creatinine, Ser: 1.56 mg/dL — ABNORMAL HIGH (ref 0.61–1.24)
Creatinine, Ser: 1.52 mg/dL — ABNORMAL HIGH (ref 0.61–1.24)
GFR calc Af Amer: 42 mL/min — ABNORMAL LOW (ref >60)
GFR calc Af Amer: 41 mL/min — ABNORMAL LOW (ref >60)
GFR calc Af Amer: 44 mL/min — ABNORMAL LOW (ref >60)
GFR calc Af Amer: 45 mL/min — ABNORMAL LOW (ref >60)
GFR calc non Af Amer: 36 mL/min — ABNORMAL LOW (ref >60)
GFR calc non Af Amer: 35 mL/min — ABNORMAL LOW (ref >60)
GFR calc non Af Amer: 38 mL/min — ABNORMAL LOW (ref >60)
GFR calc non Af Amer: 39 mL/min — ABNORMAL LOW (ref >60)
Glucose, Bld: 236 mg/dL — ABNORMAL HIGH (ref 70–99)
Glucose, Bld: 212 mg/dL — ABNORMAL HIGH (ref 70–99)
Glucose, Bld: 192 mg/dL — ABNORMAL HIGH (ref 70–99)
Glucose, Bld: 185 mg/dL — ABNORMAL HIGH (ref 70–99)
Potassium: 3.4 mmol/L — ABNORMAL LOW (ref 3.5–5.1)
Potassium: 3.5 mmol/L (ref 3.5–5.1)
Potassium: 3.4 mmol/L — ABNORMAL LOW (ref 3.5–5.1)
Potassium: 3.6 mmol/L (ref 3.5–5.1)
Sodium: 154 mmol/L — ABNORMAL HIGH (ref 135–145)
Sodium: 153 mmol/L — ABNORMAL HIGH (ref 135–145)
Sodium: 149 mmol/L — ABNORMAL HIGH (ref 135–145)
Sodium: 150 mmol/L — ABNORMAL HIGH (ref 135–145)

## 2019-09-13 LAB — COMPREHENSIVE METABOLIC PANEL
ALT: 28 U/L (ref 0–44)
AST: 58 U/L — ABNORMAL HIGH (ref 15–41)
Albumin: 2.4 g/dL — ABNORMAL LOW (ref 3.5–5.0)
Alkaline Phosphatase: 69 U/L (ref 38–126)
Anion gap: 15 (ref 5–15)
BUN: 43 mg/dL — ABNORMAL HIGH (ref 8–23)
CO2: 22 mmol/L (ref 22–32)
Calcium: 9.1 mg/dL (ref 8.9–10.3)
Chloride: 121 mmol/L — ABNORMAL HIGH (ref 98–111)
Creatinine, Ser: 1.74 mg/dL — ABNORMAL HIGH (ref 0.61–1.24)
GFR calc Af Amer: 38 mL/min — ABNORMAL LOW (ref >60)
GFR calc non Af Amer: 33 mL/min — ABNORMAL LOW (ref >60)
Glucose, Bld: 226 mg/dL — ABNORMAL HIGH (ref 70–99)
Potassium: 3.4 mmol/L — ABNORMAL LOW (ref 3.5–5.1)
Sodium: 158 mmol/L — ABNORMAL HIGH (ref 135–145)
Total Bilirubin: 2.7 mg/dL — ABNORMAL HIGH (ref 0.3–1.2)
Total Protein: 6.2 g/dL — ABNORMAL LOW (ref 6.5–8.1)

## 2019-09-13 LAB — CBC
HCT: 49.3 % (ref 39.0–52.0)
Hemoglobin: 15.3 g/dL (ref 13.0–17.0)
MCH: 27.5 pg (ref 26.0–34.0)
MCHC: 31 g/dL (ref 30.0–36.0)
MCV: 88.7 fL (ref 80.0–100.0)
Platelets: 146 10*3/uL — ABNORMAL LOW (ref 150–400)
RBC: 5.56 MIL/uL (ref 4.22–5.81)
RDW: 14.7 % (ref 11.5–15.5)
WBC: 6.4 10*3/uL (ref 4.0–10.5)
nRBC: 0 % (ref 0.0–0.2)

## 2019-09-13 LAB — D-DIMER, QUANTITATIVE: D-Dimer, Quant: 1.53 ug/mL-FEU — ABNORMAL HIGH (ref 0.00–0.50)

## 2019-09-13 MED ORDER — MORPHINE SULFATE (PF) 2 MG/ML IV SOLN
1.0000 mg | Freq: Once | INTRAVENOUS | Status: AC
  Administered 2019-09-13: 1 mg via INTRAVENOUS
  Filled 2019-09-13: qty 1

## 2019-09-13 MED ORDER — POTASSIUM CHLORIDE 10 MEQ/100ML IV SOLN
10.0000 meq | INTRAVENOUS | Status: AC
  Administered 2019-09-13 (×2): 10 meq via INTRAVENOUS
  Filled 2019-09-13 (×2): qty 100

## 2019-09-13 NOTE — Progress Notes (Signed)
Daily Progress Note   Patient Name: [name]       Date: 09/13/2019 DOB: 02-11-1924  Age: 86 y.o. MRN#: [id number] Attending Physician: Dickie La, MD Primary Care Physician: Leeanne Rio, MD Admit Date: 10/07/2019  Reason for Consultation/Follow-up: Establishing goals of care  Subjective: Per RN, patient agitated requiring safety mitts. Continues to pull off oxygen.   GOC:  F/u with daughter, [name] via telephone to provide update on diagnoses, interventions, plan of care.   [name] is very concerned about his mental status and isolation from family. [name] shares that a previous provider this admission mentioned her father would only require 10 days of isolation (therefore could potentially be off isolation by Saturday). Explained that with previous covid positive hospitalized patients, isolation has been for 21 days. Explained that I would discuss with attending.   [name] has spoken with her siblings and shares their decision for him to remain FULL code inpatient, with fear he may not receive adequate care if DNR code status and also their wish for interventions to be attempted so they could come and say goodbyes before taking him off life support (if he survived resuscitative efforts).   [name] does further share that if he does not show improvement despite aggressive medical management, the family would wish to bring him home with hospice services. They can provide 24/7 care.   [name] is requesting update from attending. Reassured [name] of ongoing support from PMT provider too. [name] has PMT contact information.    Length of Stay: 2  Current Medications: Scheduled Meds:  . enoxaparin (LOVENOX) injection  60 mg Subcutaneous Q24H  . metoprolol tartrate  5 mg Intravenous Q6H  .  pantoprazole (PROTONIX) IV  40 mg Intravenous BID    Continuous Infusions: . dextrose 125 mL/hr at 09/13/19 0559  . potassium chloride 10 mEq (09/13/19 1202)    PRN Meds: senna  Physical Exam Vitals and nursing note reviewed.            Vital Signs: BP (!) 92/56 (BP Location: Right Arm)   Pulse (!) 110   Temp 99 F (37.2 C) (Axillary)   Resp (!) 21   Ht 5\' 11"  (1.803 m)   Wt 71.8 kg   SpO2 92%   BMI 22.08 kg/m  SpO2: SpO2: 92 % O2 Device:  O2 Device: Room Air O2 Flow Rate: O2 Flow Rate (L/min): 4 L/min  Intake/output summary:   Intake/Output Summary (Last 24 hours) at 09/13/2019 1328 Last data filed at 09/13/2019 0559 Gross per 24 hour  Intake 2169.66 ml  Output 100 ml  Net 2069.66 ml   LBM:   Baseline Weight: Weight: 77 kg Most recent weight: Weight: 71.8 kg       Palliative Assessment/Data: PPS 20%   Flowsheet Rows     Most Recent Value  Intake Tab  Referral Department  Hospitalist  Unit at Time of Referral  Cardiac/Telemetry Unit  Palliative Care Primary Diagnosis  Sepsis/Infectious Disease  Palliative Care Type  New Palliative care  Reason for referral  Clarify Goals of Care  Date first seen by Palliative Care  09/12/19  Clinical Assessment  Palliative Performance Scale Score  20%  Psychosocial & Spiritual Assessment  Palliative Care Outcomes  Patient/Family meeting held?  Yes  Who was at the meeting?  daughter [name])  [place] goals of care, Counseled regarding hospice, Provided end of life care assistance, Provided psychosocial or spiritual support, Improved non-pain symptom therapy, ACP counseling assistance      Patient Active Problem List   Diagnosis Date Noted  . Multifocal pneumonia 09/12/2019  . Dehydration with hypernatremia 09/12/2019  . Encephalomalacia on imaging study 09/12/2019  . Aspiration into airway, Possible 09/12/2019  . Permanent atrial fibrillation with RVR ([place]) 09/12/2019  . Atrial fibrillation  with RVR ([place])   . Palliative care by specialist   . Acute dehydration   . Pneumonia due to COVID-19 virus 09/05/2019  . Delirium   . Respiratory tract infection due to COVID-19 virus   . Urinary hesitancy 08/23/2019  . Frequent urination 07/30/2019  . Actinic keratosis 07/30/2019  . Vision changes 05/25/2019  . Chronic kidney disease, stage 4 (severe) ([place]) 05/15/2019  . GERD (gastroesophageal reflux disease) 01/11/2019  . Allergic rhinitis 01/11/2019  . History of GI bleed 03/17/2018  . Persistent atrial fibrillation ([place])   . Goals of care, counseling/discussion 09/17/2016  . Elevated troponin   . Chronic ITP (idiopathic thrombocytopenia) ([facility])   . Coronary atherosclerosis of native coronary artery   . HTN (hypertension)   . Hyperlipidemia 04/23/2013  . Benign prostate hyperplasia 10/04/2011  . Heart failure with reduced ejection fraction ([place]) 05/25/2011    Palliative Care Assessment & Plan   Patient Profile: 86 y.o. male  with past medical history of HTN, paroxysmal atrial fibrillation, MI, NSTEMI, DVT, CVA, systolic and diastolic heart failure, ischemic cardiomyopathy with EF 30-35%, chronic ITP, CKD, BPH, recent covid-19 pneumonia hospitalized from 2/24-2/27 admitted on 10/08/2019 with weakness, AMS, and poor oral intake. Hospital admission for multifocal pneumonia, antibiotics initiated. Patient with delirium likely multifactorial, encephalomalacia on neuroimaging/increased risk for underlying dementia. Also dehydration with hypernatremia secondary to poor oral intake. Palliative medicine consultation for goals of care.   Assessment: Covid-19 infection vs. Bacterial pneumonia Afib/tachycardia Hypovolemic hypernatremia  Altered mental status AKI Chronic back pain Hx of right leg DVT Hx of CAD Chronic ITP  Recommendations/Plan:  Continue FULL code/FULL scope treatment  Ongoing palliative discussions pending clinical course.  Daughter acknowledges that patient would  not wish for prolonged heroic interventions. If patient fails to show improvement despite aggressive medical management, daughter open to further hospice discussions as the patient's wife received home hospice services and passed away at home. Very supportive family than can provide 24/7 care in the home in addition to hospice services.  Consider low-dose  Zyprexa or Seroquel for hospital delirium with possible underlying dementia. Defer to attending.  Daughter asking about isolation period. Likely 21 days per CDC recommendation for covid positive hospitalized patients. Defer to attending.   PMT provider will continue to follow and support patient/family.   Goals of Care and Additional Recommendations:  Limitations on Scope of Treatment: Full Scope Treatment  Code Status: FULL   Code Status Orders  (From admission, onward)         Start     Ordered   09/17/2019 1729  Full code  Continuous     10/04/2019 1735        Code Status History    Date Active Date Inactive Code Status Order ID Comments User Context   09/05/2019 1750 09/08/2019 1939 Full Code [id number]  Carollee Leitz, MD ED   05/15/2019 2056 05/18/2019 1832 Full Code [id number]  Lyndee Hensen, DO ED   03/17/2018 1732 03/22/2018 1820 Full Code [id number]  Bufford Lope, DO Inpatient   03/04/2018 0018 03/07/2018 1625 Full Code [id number]  Nila Nephew, MD Inpatient   08/09/2016 1048 08/11/2016 1838 Full Code [id number]  Carlyle Dolly, MD Inpatient   05/15/2016 1456 05/18/2016 1939 Full Code [id number]  Burgess Estelle, MD Inpatient   09/27/2014 1246 09/28/2014 1637 Full Code [id number]  Jessee Avers, MD Inpatient   Advance Care Planning Activity       Prognosis:  Guarded  Discharge Planning:  To Be Determined  Care plan was discussed with RN, daughter [name]), updated Dr Chauncey Reading via secure chat  Thank you for allowing the Palliative Medicine Team to assist in the care of this patient.  The above conversation was completed  via telephone due to visitor restrictions during COVID-19 pandemic. Thorough chart review and discussion with multidisciplinary team was completed as part of assessment. No physical examination was performed.   Time In: 1300 Time Out: 1330 Total Time 30 Prolonged Time Billed  no      Greater than 50%  of this time was spent counseling and coordinating care related to the above assessment and plan.  Ihor Dow, DNP, FNP-C Palliative Medicine Team  Phone: [phone] Fax: [phone]  Please contact Palliative Medicine Team phone at [phone] for questions and concerns.

## 2019-09-13 NOTE — Progress Notes (Signed)
Family Medicine Teaching Service Daily Progress Note Intern Pager: [phone]  Patient name: [name] Medical record number: [id number] Date of birth: 06/18/1924 Age: 86 y.o. Gender: male  Primary Care Provider: Leeanne Rio, MD Consultants: None Code Status: Full  [name]:  09/28/2019 admitted 09/12/19 Na 161>158 09/13/19 Na 154  Assessment and Plan: [name] is a 86 y.o. male presenting with continuation of altered mental status . PMH is significant for recent Covid diagnosis s/p hospitalization with remdesivir (4 doses), HTN, DVT, CVA, systolic and diastolic heart failure, chronic ITP, CKD  COVID-19 infection vs bacterial PNA Respiratory status today: still requires 2L O2 via [place] to maintain O2 sats. No rales or rhonchi heard on exam, however limited by patient unable to follow commands. Pt unable to tolerate PO at home or in hospital due to mentation, free water deficit remains at 4.6L today.  MRSA pcr negative, afebrile, no leukocytosis. Etiology likely due to COVID pneumonia, unlikely to be bacterial. Will plan to deescalate abx today, d/c vancomycin, cefepime.  CRP 13.4, d-dimer elevated 1.53. Can consider decadron, will continue to reassess respiratory status and discuss. Patient is 11 days out from initial positive COVID test, literature is unclear as to mortality benefit at this point. Initially family requested not to treat with decadron as it had caused agitation and delirium, however, now that patient not oriented and continues to require 2L O2 baseline, they are open to decadron use. -Daily CBC, CMP, ferritin, crp, d-dimer -Oxygen therapy 2 L nasal cannula if O2 drops lower than 92% -Continuous pulse ox and cardiac monitoring  Hypovolemic hypernatremia Difficulty obtaining BMPs yesterday due to our orders being erroneously canceled without MD team being notified. Na decreased from 161 to 158 overnight on D5 at 173mL/hr, NA  improved to 154 at 1000 this morning. Free water deficit still quite elevated at 4.6L, will continue with repletion and be cautious of volume overload due to history of HFrEF. Patient still appears volume down, tongue completely dry, skin with turgor, no evidence of volume overload (no JVP, no LE edema). -D5 IVF @ 125 mL/Hr -BMP q4h  Afib  Tachycardia Afib is chronic problem. Today patient is relatively rate controlled, 110-115. Continue IV metoprolol as patient unable to take PO meds. -continue lovenox per pharmacy consult -Continue Metoprolol IV   AKI Patient's creatinine on admission was 1.97, today 1.74.  Creatinine over the past year have ranged from 1.72-2.81. -q4h BMP -D5 IVF -Avoid nephrotoxic agents  HTN Chronic. Stable. Initial BP in ED was 142/85. Home medications include Metoprolol, Torsemide, Potassium 20 meq daily. -Continue Metoprolol IV -Holding torsemide at this time due to dehydration, AKI  Chronic Back Pain Per chart review patient reported herniated disc at his last admission. Last received 02/04. Home medications Ibuprofen 200 mg daily, Robaxin 500 mg qhs and Lyrica 75 mg BID.On exam no pain on palpation. -Holding NSAIDs at this time due to AKI -Holding Robaxin and Lyrica at this time due to altered mental status, unable to take oral meds  HLD Chronic. Stable. Lipid Profile(11/20) wnl. Home medication pravastatin 20 mg daily -hold Pravastatin 20 mg daily due to mentation, pt unable to take oral meds  H/O Rt leg DVT Chronic. Stable.Denies any leg pain or swelling or shortness of breath. Home medication include Xarelto 15 mg daily. Patient unable to take oral medications due to mentation. Will switch to lovenox with renal dose adjustment. -continue lovenox per pharmacy consult -PT/OT eval  CAD s/p  stent placement Chronic. Stable.  Patient has history of MI.  EKG showed A. fib with rapid ventricular rate on admission, PAF with rate control now.  Home  medication include Metoprolol 12.5 mg BID, switched to IV due to not able to take oral meds. Nitroglycerine 0.4 mg s/l although patient reports has not had to use in years. -Continuous cardiac monitoring. -Continue Metoprolol IV -HoldhomeNitroglycerinefor prn chest pain  BPH Chronic. Stable. Home medication Flomax 0.4mg  daily. Patient unable to tolerate oral medications due to mentation. Foley catheter in place, p[atient is making urine, noted 400 mL in bag this morning, however not charted. Will discuss charting UOP. -Continue to monitor -Strict I/Os  Chronic ITP Chronic. Stable. On admission platelets 190 (likely hemoconcentrated due to no PO x3 days), today improved to 146. Baseline 110-133. Patient's daughter denies any recent active bleeding -Continue to monitor -CBC in am  GERD Chronic. Stable. Home medications include Pepcid 20 mg daily and Protonix 40 mg twice daily. Patient unable to take oral medications due to mental status -Holding Pepcid at this time  FEN/GI: Diet NPO except sips w/ meds PPx: lovenox per pharmacy consult  Disposition: to med-surg pending medical work up and stabilization, ultimate disposition pending medical status, family may take him home with home hospice if no improvement in 1 week  Subjective:  Patient alert, but oriented x0, non verbal. Appears very uncomfortable, not agitated  Objective: Temp:  [98.5 F (36.9 C)-99.6 F (37.6 C)] 98.5 F (36.9 C) (03/04 0403) Pulse Rate:  [93-112] 110 (03/04 0403) Resp:  [16-26] 21 (03/04 0403) BP: (91-161)/(56-114) 161/114 (03/04 0403) SpO2:  [90 %-93 %] 92 % (03/04 0403) Physical Exam: General: older man, in mild distress, resting in bed, alert but not oriented, unable to follow commands or take anything by mouth Cardiovascular: irregular rate, tachycardic to 110-115. No m/r/g Respiratory: Unable to appreciate lung sounds on exam due to patient inability to follow commands. No increased WOB,  satting well on 2L O2 [place]. Abdomen: soft, NT, ND, normal BS present Extremities: no edema in bilateral LE, warm, dry, several excoriations present on ant L shin now covered with bandage, no erythema, warmth or pustulance   Laboratory: Recent Labs  Lab 10/09/2019 1351 09/12/19 0101 09/13/19 0237  WBC 5.1 5.8 6.4  HGB 16.7 15.7 15.3  HCT 55.7* 51.2 49.3  PLT 190 150 146*   Recent Labs  Lab 09/08/19 0131 09/08/19 0131 09/27/2019 1351 10/01/2019 2045 09/12/19 1844 09/12/19 2143 09/13/19 0237  NA 148*   < > 159*   < > 161* 157* 158*  K 4.3   < > 4.1   < > 3.6 3.7 3.4*  CL 107   < > 118*   < > 123* 122* 121*  CO2 27   < > 26   < > 24 22 22   BUN 43*   < > 58*   < > 48* 47* 43*  CREATININE 1.85*   < > 1.94*   < > 1.75* 1.78* 1.74*  CALCIUM 9.0   < > 9.5   < > 9.0 8.8* 9.1  PROT 6.2*  --  7.5  --   --   --  6.2*  BILITOT 1.7*  --  2.4*  --   --   --  2.7*  ALKPHOS 77  --  90  --   --   --  69  ALT 26  --  25  --   --   --  28  AST  59*  --  43*  --   --   --  58*  GLUCOSE 137*   < > 138*   < > 150* 169* 226*   < > = values in this interval not displayed.    Imaging/Diagnostic Tests: No results found. Gladys Damme, MD 09/13/2019, 8:45 AM PGY-1, [place] Intern pager: [phone], text pages welcome

## 2019-09-13 NOTE — Progress Notes (Signed)
FPTS Interim Progress Note  S: Received page that the patient was moaning and having hiccups. Reported to bedside in order to evaluate the patient who was moaning nonspecifically upon my entering the room. Patient did make eye contact when I asked if he was in pain and verbalized what sounded like "everywhere". Patient continued to groan intermittently throughout my exam with nonspecific site of tenderness   O: BP (!) 97/50 (BP Location: Right Arm)   Pulse 100   Temp 98.9 F (37.2 C) (Axillary)   Resp 18   Ht 5\' 11"  (1.803 m)   Wt 71.8 kg   SpO2 92%   BMI 22.08 kg/m    General: frail, elderly male lying in bed, moaning as I entered the room, somewhat responsive with unclear verbal expressions  Cardiac: slightly increased rate, difficult to assess for rhythm given moaning  Pulmonary: able to appreciate some air movement  Abdomen: patient does not respond as if he is specifically tender when palpating quadrants of his abdomen   A/P: Pain Management  -RX 1 mg IV morphine, one time dose  Stark Klein, MD 09/13/2019, 8:22 PM PGY-1, [place] Medicine Service pager [phone]

## 2019-09-14 DIAGNOSIS — E860 Dehydration: Secondary | ICD-10-CM

## 2019-09-14 LAB — BASIC METABOLIC PANEL
Anion gap: 10 (ref 5–15)
Anion gap: 8 (ref 5–15)
Anion gap: 9 (ref 5–15)
BUN: 29 mg/dL — ABNORMAL HIGH (ref 8–23)
BUN: 26 mg/dL — ABNORMAL HIGH (ref 8–23)
BUN: 23 mg/dL (ref 8–23)
CO2: 24 mmol/L (ref 22–32)
CO2: 25 mmol/L (ref 22–32)
CO2: 26 mmol/L (ref 22–32)
Calcium: 8.3 mg/dL — ABNORMAL LOW (ref 8.9–10.3)
Calcium: 8.5 mg/dL — ABNORMAL LOW (ref 8.9–10.3)
Calcium: 8.6 mg/dL — ABNORMAL LOW (ref 8.9–10.3)
Chloride: 112 mmol/L — ABNORMAL HIGH (ref 98–111)
Chloride: 112 mmol/L — ABNORMAL HIGH (ref 98–111)
Chloride: 111 mmol/L (ref 98–111)
Creatinine, Ser: 1.39 mg/dL — ABNORMAL HIGH (ref 0.61–1.24)
Creatinine, Ser: 1.45 mg/dL — ABNORMAL HIGH (ref 0.61–1.24)
Creatinine, Ser: 1.37 mg/dL — ABNORMAL HIGH (ref 0.61–1.24)
GFR calc Af Amer: 50 mL/min — ABNORMAL LOW (ref >60)
GFR calc Af Amer: 48 mL/min — ABNORMAL LOW (ref >60)
GFR calc Af Amer: 51 mL/min — ABNORMAL LOW (ref >60)
GFR calc non Af Amer: 43 mL/min — ABNORMAL LOW (ref >60)
GFR calc non Af Amer: 41 mL/min — ABNORMAL LOW (ref >60)
GFR calc non Af Amer: 44 mL/min — ABNORMAL LOW (ref >60)
Glucose, Bld: 224 mg/dL — ABNORMAL HIGH (ref 70–99)
Glucose, Bld: 197 mg/dL — ABNORMAL HIGH (ref 70–99)
Glucose, Bld: 185 mg/dL — ABNORMAL HIGH (ref 70–99)
Potassium: 3.5 mmol/L (ref 3.5–5.1)
Potassium: 3.9 mmol/L (ref 3.5–5.1)
Potassium: 3.5 mmol/L (ref 3.5–5.1)
Sodium: 146 mmol/L — ABNORMAL HIGH (ref 135–145)
Sodium: 145 mmol/L (ref 135–145)
Sodium: 146 mmol/L — ABNORMAL HIGH (ref 135–145)

## 2019-09-14 LAB — CBC
HCT: 44.4 % (ref 39.0–52.0)
Hemoglobin: 13.9 g/dL (ref 13.0–17.0)
MCH: 27.5 pg (ref 26.0–34.0)
MCHC: 31.3 g/dL (ref 30.0–36.0)
MCV: 87.7 fL (ref 80.0–100.0)
Platelets: 130 10*3/uL — ABNORMAL LOW (ref 150–400)
RBC: 5.06 MIL/uL (ref 4.22–5.81)
RDW: 14.6 % (ref 11.5–15.5)
WBC: 6.5 10*3/uL (ref 4.0–10.5)
nRBC: 0 % (ref 0.0–0.2)

## 2019-09-14 LAB — D-DIMER, QUANTITATIVE: D-Dimer, Quant: 2.37 ug/mL-FEU — ABNORMAL HIGH (ref 0.00–0.50)

## 2019-09-14 LAB — GLUCOSE, CAPILLARY: Glucose-Capillary: 163 mg/dL — ABNORMAL HIGH (ref 70–99)

## 2019-09-14 LAB — FERRITIN: Ferritin: 269 ng/mL (ref 24–336)

## 2019-09-14 LAB — C-REACTIVE PROTEIN: CRP: 14.7 mg/dL — ABNORMAL HIGH (ref <1.0)

## 2019-09-14 MED ORDER — DEXTROSE-NACL 5-0.45 % IV SOLN: INTRAVENOUS | Status: AC

## 2019-09-14 MED ORDER — SODIUM CHLORIDE 0.45 % IV SOLN: INTRAVENOUS | Status: DC

## 2019-09-14 MED ORDER — ENOXAPARIN SODIUM 80 MG/0.8ML ~~LOC~~ SOLN
70.0000 mg | SUBCUTANEOUS | Status: DC
  Administered 2019-09-15 – 2019-09-17 (×3): 70 mg via SUBCUTANEOUS
  Filled 2019-09-14 (×3): qty 0.8

## 2019-09-14 NOTE — Progress Notes (Signed)
Family Medicine Teaching Service Daily Progress Note Intern Pager: [phone]  Patient name: [name] Medical record number: [id number] Date of birth: [birth year] Age: 86 y.o. Gender: male  Primary Care Provider: Leeanne Rio, MD Consultants: None Code Status: Full  [name]:  09/26/2019 admitted 09/12/19 Na 161>158 09/13/19 Na 154 09/14/19 Na 146, mentation improved  Assessment and Plan: [name] is a 86 y.o. male presenting with continuation of altered mental status . PMH is significant for recent Covid diagnosis s/p hospitalization with remdesivir (4 doses), HTN, DVT, CVA, systolic and diastolic heart failure, chronic ITP, CKD  COVID-19 infection vs bacterial PNA Mentation improved today. Previously patient moaning, not responding to his name, completely disoriented. Today, he is not moaning, and when I called his name, he turned, looked at me, and opened his eyes. He is still not able to respond with words, but appears much improved from yesterday. Respiratory status today: requiring 1.5L O2 via [place] to maintain O2 sats >94%. This provider turned off all O2 and observed patient with pulse oximetry for 10 minutes. Pt never desatted, O2 remained 90-92%, however recommendations for COVID are O2 sats >94% (no COPD). No rales or rhonchi heard on exam, however limited by patient unable to follow commands. Pt still unable to tolerate PO due to mentation.  CRP increased to 14.7, d-dimer elevated 2.37.  Patient is 11 days out from initial positive COVID test.  -Daily CBC, CMP, ferritin, crp, d-dimer -Oxygen therapy maintain sats greater than 94% -Continuous pulse ox and cardiac monitoring -SLP eval for swallow study  Hypovolemic hypernatremia- resolving Na decreased from 158 yesterday morning to 146 today. Free water deficit improved to 2L from 4.6L. Will continue with repletion and be cautious of volume overload due to history of HFrEF. Fluid  repletion plan today: D5 1/2 NS@ 191mL/hr for 12 hours, then decrease to 100 mL/hr and reassess tomorrow. Patient unable to take PO due to mentation. SLP eval for swallow assessment. If unable to take PO today, will consider NG tube and enteral feeding. -D5 1/2NS IVF @ 125 mL/Hr x 12 hours, then decrease rate to 139mL/hr -daily BMP  Malnutrition Patient unable to take anything by mouth due to mentation since 2/27. Swallow eval today with SLP. If no PO, then will consult nutrition and place NG tube for enteral feeds. Spoke with daughter, [name], approved NGT if needed. -SLP eval -Nutrtion recs -Can place NGT if needed  Afib  Tachycardia Afib is chronic problem. Today patient is relatively rate controlled, 95-115. IV metoprolol held at [facility] for hypotension, can give later in day. Continue IV metoprolol as patient unable to take PO meds. -continue lovenox per pharmacy consult -Continue Metoprolol IV   AKI- resolved Patient's creatinine on admission was 1.97, today 1.74.  Creatinine over the past year have ranged from 1.72-2.81. -daily BMP -Avoid nephrotoxic agents  HTN Metoprolol IV held at 9 AM for hypotension. BP today ranged SBP 97-107, DBP 50-93. Most recent BP 98/52.  Home medications include Metoprolol, Torsemide, Potassium 20 meq daily. -Continue Metoprolol IV, RN to call if concerned for hypotension -Holding torsemide at this time due to dehydration, AKI  Chronic Back Pain Per chart review patient reported herniated disc at his last admission.Home medications Ibuprofen 200 mg daily, Robaxin 500 mg qhs and Lyrica 75 mg BID.On exam no pain on palpation. -Holding home meds at this time due to altered mental status, unable to take oral meds  HLD Chronic. Stable. Lipid  Profile(11/20) wnl. Home medication pravastatin 20 mg daily -hold Pravastatin 20 mg daily due to mentation, pt unable to take oral meds  H/O Rt leg DVT Chronic. Stable.Denies any leg pain or swelling  or shortness of breath. Home medication include Xarelto 15 mg daily. Patient unable to take oral medications due to mentation. Will switch to lovenox with renal dose adjustment. -continue lovenox per pharmacy consult -PT/OT eval  CAD s/p stent placement Chronic. Stable.  Patient has history of MI.  EKG showed A. fib with rapid ventricular rate on admission, PAF with rate control now.  Home medication include Metoprolol 12.5 mg BID, switched to IV due to not able to take oral meds. Nitroglycerine 0.4 mg s/l although patient reports has not had to use in years. -Continuous cardiac monitoring. -Continue Metoprolol IV -HoldhomeNitroglycerinefor prn chest pain  BPH Chronic. Stable. Home medication Flomax 0.4mg  daily. Patient unable to tolerate oral medications due to mentation. Foley catheter in place, p[atient is making urine,  however not charted. Will discuss charting UOP. -Continue to monitor  -Strict I/Os  Chronic ITP Chronic. Stable. On admission platelets 190 (likely hemoconcentrated due to no PO x3 days), today improved to 130. Baseline 110-133. Patient's daughter denies any recent active bleeding -Continue to monitor -CBC in am  GERD Chronic. Stable. Home medications include Pepcid 20 mg daily and Protonix 40 mg twice daily. Patient unable to take oral medications due to mental status -Holding Pepcid at this time  FEN/GI: Diet NPO except sips w/ meds PPx: lovenox per pharmacy consult  Disposition: to med-surg pending medical work up and stabilization, ultimate disposition pending medical status, family may take him home with home hospice if no improvement in 1 week  Subjective:  Patient alert, responding to his name, still nonverbal and unable to take PO  Objective: Temp:  [98.2 F (36.8 C)-99.4 F (37.4 C)] 98.2 F (36.8 C) (03/05 0635) Pulse Rate:  [100-104] 104 (03/05 0635) Resp:  [18-23] 23 (03/05 0635) BP: (97-107)/(50-93) 107/93 (03/05 0635) SpO2:  [92 %]  92 % (03/04 1944) Physical Exam: General: older man, in NAD, resting in bed, alert but not oriented, responding to name not able to take anything by mouth Cardiovascular: irregular rate, tachycardic to 95-115. No m/r/g Respiratory: Unable to appreciate lung sounds on exam due to patient inability to follow commands. No increased WOB, satting well on 1.5L O2 [place]. Abdomen: soft, NT, ND, normal BS present Extremities: no edema in bilateral LE, warm, dry, several excoriations present on ant L shin now covered with bandage, no erythema, warmth or pustulance   Laboratory: Recent Labs  Lab 09/12/19 0101 09/13/19 0237 09/14/19 0304  WBC 5.8 6.4 6.5  HGB 15.7 15.3 13.9  HCT 51.2 49.3 44.4  PLT 150 146* 130*   Recent Labs  Lab 09/08/19 0131 09/08/19 0131 09/10/2019 1351 10/03/2019 2045 09/13/19 0237 09/13/19 0938 09/13/19 1839 09/13/19 2300 09/14/19 0742  NA 148*   < > 159*   < > 158*   < > 149* 150* 146*  K 4.3   < > 4.1   < > 3.4*   < > 3.4* 3.6 3.5  CL 107   < > 118*   < > 121*   < > 115* 118* 112*  CO2 27   < > 26   < > 22   < > 25 24 24   BUN 43*   < > 58*   < > 43*   < > 33* 31* 29*  CREATININE 1.85*   < >  1.94*   < > 1.74*   < > 1.56* 1.52* 1.39*  CALCIUM 9.0   < > 9.5   < > 9.1   < > 8.5* 8.4* 8.3*  PROT 6.2*  --  7.5  --  6.2*  --   --   --   --   BILITOT 1.7*  --  2.4*  --  2.7*  --   --   --   --   ALKPHOS 77  --  90  --  69  --   --   --   --   ALT 26  --  25  --  28  --   --   --   --   AST 59*  --  43*  --  58*  --   --   --   --   GLUCOSE 137*   < > 138*   < > 226*   < > 192* 185* 224*   < > = values in this interval not displayed.    Imaging/Diagnostic Tests: No results found. Gladys Damme, MD 09/14/2019, 9:34 AM PGY-1, [place] Intern pager: [phone], text pages welcome

## 2019-09-14 NOTE — Progress Notes (Signed)
   Palliative Medicine Inpatient Follow Up Note   HPI: Alan Loh Lineberryis a 86 y.o.malepresenting with continuation of altered mental status. PMH is significant forrecent Covid diagnosis s/p hospitalization with remdesivir (4 doses), HTN, DVT, CVA,systolic and diastolic heart failure, chronic ITP, CK.   Today's Discussion (09/14/2019): Chart reviewed. Evaluated [name] this afternoon. He was quite delirious, not able to follow verbal commands.  Spoke to patients daughter, [name] who was reassured by the report she had gotten from the primary medical team. I shared with her that he remains in a tenuous situation. She stated that she remains hopeful for improvements.   Discussed with patient the importance of continued conversation with family and their  medical providers regarding overall plan of care and treatment options, ensuring decisions are within the context of the patients values and GOCs.  Questions and concerns addressed   Vital Signs Vitals:   09/14/19 1200 09/14/19 1242  BP:  (!) 98/52  Pulse: 91   Resp:    Temp: 99 F (37.2 C)   SpO2: 93%     Intake/Output Summary (Last 24 hours) at 09/14/2019 1603 Last data filed at 09/14/2019 0400 Gross per 24 hour  Intake 2663.13 ml  Output --  Net 2663.13 ml   Last Weight  Most recent update: 09/12/2019 12:06 AM   Weight  71.8 kg (158 lb 4.6 oz)           Gen:  Elderly M, foot off bed HEENT: moist mucous membranes CV: Irregular rate and regular rhythm, no murmurs rubs or gallops PULM: diminished to auscultation bilaterally. No wheezes/rales/rhonchi  ABD: soft/nontender/nondistended/normal bowel sounds  EXT: No edema  Neuro: Disoriented  Recommendations and Plan:  Continue FULL code/FULL scope treatment  Ongoing palliative discussions pending clinical course.  Daughter acknowledges that patient would not wish for prolonged heroic interventions. If patient fails to show improvement despite aggressive medical  management, daughter open to further hospice discussions as the patient's wife received home hospice services and passed away at home. Very supportive family than can provide 24/7 care in the home in addition to hospice services.  Consider low-dose Zyprexa or Seroquel for hospital delirium with possible underlying dementia. Defer to attending.  Daughter asking about isolation period. Likely 21 days per CDC recommendation for covid positive hospitalized patients. Defer to attending.   Time Spent: 25 Greater than 50% of the time was spent in counseling and coordination of care ______________________________________________________________________________________ [place] Team Team Cell Phone: [phone] Please utilize secure chat with additional questions, if there is no response within 30 minutes please call the above phone number  Palliative Medicine Team providers are available by phone from 7am to 7pm daily and can be reached through the team cell phone.  Should this patient require assistance outside of these hours, please call the patient's attending physician.

## 2019-09-14 NOTE — Progress Notes (Addendum)
Nutrition Follow-up   RD working remotely.  DOCUMENTATION CODES:   Not applicable  INTERVENTION:  Diet advancement as appropriate per MD and care team.  If enteral nutrition warranted,  Recommend Osmolite 1.5 formula via NGT at 25 ml/hr and increase by 10 ml every 4 hours to goal rate of 45 ml/hr.  30 ml Prostat once daily.  Tube feeding to provide 1720 kcal, 83 grams of protein, 821 ml water.   NUTRITION DIAGNOSIS:   Inadequate oral intake related to inability to eat as evidenced by NPO status; ongoing  GOAL:   Patient will meet greater than or equal to 90% of their needs; not met  MONITOR:   Diet advancement, Skin, Weight trends, I & O's, Labs  REASON FOR ASSESSMENT:   Malnutrition Screening Tool    ASSESSMENT:   86 y.o. male presenting with continuation of altered mental status . PMH is significant for recent Covid diagnosis s/p hospitalization with remdesivir (4 doses), HTN, DVT, CVA, systolic and diastolic heart failure, chronic ITP, CKD4. Pt with COVID-19 infection vs bacterial PNA  Pt continues on NPO status. Per RN, pt continues to be confused and unable to follow directions well. SLP consulted for swallow evaluation today. If diet unable to be advanced within in the next 24-48 hours, may recommend enteral nutrition. Noted per palliative note, pt current full code/full scope treatment. Per MD, family has given approval for NGT if needed.  If enteral nutrition warranted, recommend Osmolite 1.5 formula via tube at 25 ml/hr and increase by 10 ml every 4 hours to goal rate of 45 ml/hr with 30 ml Prostat once daily to provide 1720 kcal, 83 grams of protein, 821 ml water.   Labs and medications reviewed. Sodium elevated at 146. IV fluids infusing.   Diet Order:   Diet Order            Diet NPO time specified Except for: Sips with Meds  Diet effective now              EDUCATION NEEDS:   Not appropriate for education at this time  Skin:  Skin Assessment:  Reviewed RN Assessment  Last BM:  3/4  Height:   Ht Readings from Last 1 Encounters:  10/09/2019 5' 11"  (1.803 m)    Weight:   Wt Readings from Last 1 Encounters:  09/12/19 71.8 kg    BMI:  Body mass index is 22.08 kg/m.  Estimated Nutritional Needs:   Kcal:  1700-1850  Protein:  75-85 grams  Fluid:  >/= 1.7 L/day    Corrin Parker, MS, RD, LDN RD pager number/after hours weekend pager number on Amion.

## 2019-09-14 NOTE — Progress Notes (Addendum)
Palliative Medicine RN Note: Rec'd a message on our team line from [name], Mr Uselman's daughter, requesting a call back. Our NP Jinny Blossom has been following Mr Lofgren but is off-service, and the NP who is picking him up has been unavailable so far today to follow up but will call her later; I will return her call and let her know.  I have also called FMTS/attending service and asked that they call her at [phone].  Marjie Skiff Rajat Staver, RN, BSN, [facility] Palliative Medicine Team 09/14/2019 1:52 PM Office [phone]   ADDENDUM: Emilia Beck back; no answer; voice mailbox full; unable to leave a message.  Marjie Skiff Hence Derrick, RN, BSN, [facility] Palliative Medicine Team 09/14/2019 1:54 PM Office [phone]

## 2019-09-14 NOTE — Progress Notes (Signed)
ANTICOAGULATION CONSULT NOTE - Consult  Pharmacy Consult for Enoxaparin Indication: atrial fibrillation  Allergies  Allergen Reactions  . Gabapentin Other (See Comments)    Confusion and caused falls  . Tape Other (See Comments)    SKIN IS THIN AND TEARS EASILY!! PLEASE USE AN ALTERNATIVE!!  . Eliquis [Apixaban] Swelling and Other (See Comments)    Causes swollen ankles and blot clot in eyes  . Prednisone Other (See Comments)    "Made me not feel right when I took it"  . Rosuvastatin Other (See Comments)    "Makes me ache"    Patient Measurements: Height: 5\' 11"  (180.3 cm) Weight: 158 lb 4.6 oz (71.8 kg) IBW/kg (Calculated) : 75.3  Vital Signs: Temp: 98.6 F (37 C) (03/05 1706) Temp Source: Axillary (03/05 1706) BP: 108/58 (03/05 1706) Pulse Rate: 108 (03/05 1706)  Labs: Recent Labs     0000 10/01/2019 2045 10/04/2019 2245 09/12/19 0101 09/12/19 0431 09/12/19 0953 09/13/19 0237 09/13/19 0938 09/13/19 2300 09/14/19 0304 09/14/19 0742 09/14/19 1117 09/14/19 1455  HGB   < >  --   --  15.7  --   --  15.3  --   --  13.9  --   --   --   HCT  --   --   --  51.2  --   --  49.3  --   --  44.4  --   --   --   PLT  --   --   --  150  --   --  146*  --   --  130*  --   --   --   CREATININE  --    < >  --  1.81*  --    < > 1.74*   < >   < >  --  1.39* 1.45* 1.37*  TROPONINIHS  --   --  252* 229* 175*  --   --   --   --   --   --   --   --    < > = values in this interval not displayed.    Estimated Creatinine Clearance: 34.2 mL/min (A) (by C-G formula based on SCr of 1.37 mg/dL (H)).   Medical History: Past Medical History:  Diagnosis Date  . Abnormal CT of the chest 07/21/2016   January 2018 IMPRESSION: 1. Residual patchy ground-glass opacities throughout both lungs at the resolved areas of consolidation from the 05/16/2016 chest CT, consistent with a resolving infectious or inflammatory process. 2. Bandlike opacity in the anterior right upper lobe is new since  05/16/2016, was probably present on 06/17/2016 chest radiograph, favor evolving postinfectious/postinflammatory   . Acute anemia 05/15/2019  . AKI (acute kidney injury) ([place])   . Allergic rhinitis 01/11/2019  . Anemia 09/16/2011  . Benign prostate hyperplasia 10/04/2011  . Bladder neck obstruction 10/04/2011  . Cerebrovascular accident (CVA) ([place]) 08/09/2016  . Chronic combined systolic (congestive) and diastolic (congestive) heart failure ([place])   . Chronic ITP (idiopathic thrombocytopenia) ([facility])   . Chronic kidney disease, stage 4 (severe) ([place]) 05/15/2019  . Coronary atherosclerosis of native coronary artery    a. Anterior STEMI 2009 s/p BMSx2 to mid & prox LAD and angioplasty to distal LAD. total RCA with collaterals, moderate Cx plaquing. a. NSTEMI 02/2018 - chronically occluded RCA, patent LAD stents and severe stenosis in the obtuse marginal branch treated with a drug eluting stent.  [name] COVID-19 09/05/2019  . Degenerative disk disease 12/20/2010  Sees Dr. Eddie Dibbles.  Has had MRI.   . Enlarged prostate with lower urinary tract symptoms (LUTS) 06/09/2011  . Gastritis and gastroduodenitis   . GERD (gastroesophageal reflux disease) 01/11/2019  . GI bleed 05/15/2019  . Gross hematuria   . Hemoptysis 05/16/2016  . History of GI bleed 03/17/2018   Admitted 03/17/18 with acute GI bleeding. EGD showed gastritis but no active bleeding. Aspirin stopped and Plavix and Xareltro continued.   [name] HTN (hypertension)   . Hyperlipidemia 04/23/2013  . Hypotension   . Iron deficiency anemia   . Ischemic cardiomyopathy   . Microscopic hematuria 09/28/2014  . Myocardial infarct ([place]) 03/21/2008  . NSTEMI (non-ST elevated myocardial infarction) ([place]) 05/16/2016  . NSVT (nonsustained ventricular tachycardia) ([place])    a. Per DC summary from time of STEMI 2009.  . Orthostatic hypotension 11/26/2016  . PAD (peripheral artery disease) ([place]) 08/13/2016  . Paroxysmal atrial flutter ([place])   . Persistent atrial fibrillation ([place])   .  Phlebitis and thrombophlebitis of superficial vessels of lower extremities   . Pure hypercholesterolemia    a. Has not tolerated statins in the past.  . Right leg DVT ([place])    a. Dx 01/2014.  [name] Sinus bradycardia    a. By prior event monitor.   . Skin tear of forearm without complication 67/20/9470  . Symptomatic anemia   . Thrombocytopenia ([place])   . Urinary retention 06/20/2011    Medications:  Scheduled:  . [START ON 09/15/2019] enoxaparin (LOVENOX) injection  70 mg Subcutaneous Q24H  . metoprolol tartrate  5 mg Intravenous Q6H  . pantoprazole (PROTONIX) IV  40 mg Intravenous BID   Infusions:  . dextrose 5 % and 0.45% NaCl 125 mL/hr at 09/14/19 1132    Assessment: [name] is a 86 y.o. male who presented with continuation of altered mental status . PMH is significant for recent Covid diagnosis s/p hospitalization with remdesivir, HTN, DVT, CVA, systolic and diastolic heart failure, chronic ITP, CKD, and Afib (on xarelto PTA). Pt is unable to take PO medications due to mental status. Pharmacy has been consulted to dose Lovenox. Primary team is avoiding heparin due to sodium load with heparin infusion given patient's sodium is improving  Patient's last dose of xarelto was 09/10/2019 at 1730. D-dimer increased 1.53>>2.37. Troponins are decreasing 252>>175. Patient's renal function is improving. CrCl is ~30-35  Goal of Therapy:  Anti-Xa level 0.6-1 units/ml 4hrs after LMWH dose given Monitor platelets by anticoagulation protocol: Yes   Plan:  Give renally adjusted lovenox at 70 mg q24 hours (increased from 60 mg) Pt technically qualifies for 1mg /kg q12 dosing of lovenox but given labile Scr, age, and GI bleeding history in November, will keep on q24 hour dosing and obtain anti-Xa level. Would eventually recommend obtaining anti-Xa level if patient remains on enoxaparin on 3/8  Sherren Kerns, PharmD PGY1 [place] Resident 09/14/2019,5:48 PM

## 2019-09-15 ENCOUNTER — Inpatient Hospital Stay (HOSPITAL_COMMUNITY): Payer: Medicare PPO

## 2019-09-15 DIAGNOSIS — R52 Pain, unspecified: Secondary | ICD-10-CM

## 2019-09-15 DIAGNOSIS — I998 Other disorder of circulatory system: Secondary | ICD-10-CM

## 2019-09-15 LAB — CBC
HCT: 43.5 % (ref 39.0–52.0)
Hemoglobin: 13.6 g/dL (ref 13.0–17.0)
MCH: 27.5 pg (ref 26.0–34.0)
MCHC: 31.3 g/dL (ref 30.0–36.0)
MCV: 87.9 fL (ref 80.0–100.0)
Platelets: 118 10*3/uL — ABNORMAL LOW (ref 150–400)
RBC: 4.95 MIL/uL (ref 4.22–5.81)
RDW: 14.5 % (ref 11.5–15.5)
WBC: 6.6 10*3/uL (ref 4.0–10.5)
nRBC: 0 % (ref 0.0–0.2)

## 2019-09-15 LAB — BASIC METABOLIC PANEL
Anion gap: 11 (ref 5–15)
BUN: 22 mg/dL (ref 8–23)
CO2: 23 mmol/L (ref 22–32)
Calcium: 8.4 mg/dL — ABNORMAL LOW (ref 8.9–10.3)
Chloride: 110 mmol/L (ref 98–111)
Creatinine, Ser: 1.22 mg/dL (ref 0.61–1.24)
GFR calc Af Amer: 59 mL/min — ABNORMAL LOW (ref >60)
GFR calc non Af Amer: 51 mL/min — ABNORMAL LOW (ref >60)
Glucose, Bld: 191 mg/dL — ABNORMAL HIGH (ref 70–99)
Potassium: 3.2 mmol/L — ABNORMAL LOW (ref 3.5–5.1)
Sodium: 144 mmol/L (ref 135–145)

## 2019-09-15 LAB — D-DIMER, QUANTITATIVE: D-Dimer, Quant: 2.27 ug/mL-FEU — ABNORMAL HIGH (ref 0.00–0.50)

## 2019-09-15 LAB — GLUCOSE, CAPILLARY: Glucose-Capillary: 115 mg/dL — ABNORMAL HIGH (ref 70–99)

## 2019-09-15 LAB — FERRITIN: Ferritin: 279 ng/mL (ref 24–336)

## 2019-09-15 LAB — C-REACTIVE PROTEIN: CRP: 17.3 mg/dL — ABNORMAL HIGH (ref <1.0)

## 2019-09-15 MED ORDER — SODIUM CHLORIDE 0.9% FLUSH
10.0000 mL | INTRAVENOUS | Status: DC | PRN
  Administered 2019-09-18 (×3): 10 mL

## 2019-09-15 MED ORDER — POTASSIUM CHLORIDE 10 MEQ/100ML IV SOLN: 10.0000 meq | INTRAVENOUS | Status: DC

## 2019-09-15 MED ORDER — KCL IN DEXTROSE-NACL 20-5-0.45 MEQ/L-%-% IV SOLN
INTRAVENOUS | Status: DC
  Filled 2019-09-15 (×4): qty 1000

## 2019-09-15 MED ORDER — SODIUM CHLORIDE 0.9% FLUSH
10.0000 mL | Freq: Two times a day (BID) | INTRAVENOUS | Status: DC
  Administered 2019-09-15 – 2019-09-18 (×7): 10 mL

## 2019-09-15 MED ORDER — ACETAMINOPHEN 80 MG RE SUPP
80.0000 mg | RECTAL | Status: DC | PRN
  Filled 2019-09-15: qty 1

## 2019-09-15 NOTE — Progress Notes (Signed)
VASCULAR LAB PRELIMINARY  PRELIMINARY  PRELIMINARY  PRELIMINARY  ABIs completed.    Preliminary report:  See CV proc for preliminary results.   Caelynn Marshman, RVT 09/15/2019, 11:32 AM

## 2019-09-15 NOTE — Progress Notes (Signed)
FPTS Interim Progress Note  S: due to patient's time from positive diagnosis of COVID (feb 12th) >21 days, contacted infection prevention for transfer out of isolation. This transfer will likely allow the patient's family to visit him and could have a positive impact on his orientation and recovery. Will call patient's daughter to inform her once he is transferred so I can tell her the new room number.   Additionally, nurse indicated that patient's left foot remains cool to touch and not able to locate pedal pulse with doppler on multiple attempts. Will try to place heating pads at feet to stimulate blood flow as ABI today was negative for blockage. Can consult VVS if not improved or worsening.  O: BP 109/75 (BP Location: Right Arm)   Pulse (!) 106   Temp 98.8 F (37.1 C) (Axillary)   Resp (!) 21   Ht 5\' 11"  (1.803 m)   Wt 74.9 kg   SpO2 96%   BMI 23.03 kg/m     Richarda Osmond, DO 09/15/2019, 4:35 PM PGY-2, [place] Service pager [phone]

## 2019-09-15 NOTE — Progress Notes (Signed)
The patient's daughter contacted this nurse in regards to a provider's note.  The daughter said that she has been reading the progress notes and would like to make clear her choices for her father's care.  The daughter told this nurse that she is agreeable to a PEG tube placement if it is //needed//, not that she prefers to have a PEG placed (vs NG).  She told me that she would very much like to speak with DO Ouida Sills to clarify tomorrow.

## 2019-09-15 NOTE — Progress Notes (Signed)
   Palliative Medicine Inpatient Follow Up Note   HPI: Stanislaus Kaltenbach Lineberryis a 86 y.o.malepresenting with continuation of altered mental status. PMH is significant forrecent Covid diagnosis s/p hospitalization with remdesivir (4 doses), HTN, DVT, CVA,systolic and diastolic heart failure, chronic ITP, CK.   Today's Discussion (09/15/2019): Chart reviewed. Evaluated Mr. Yani this morning. He appears more alert this morning. His left foot was noted to be cool to the touch, will request a doppler by the nursing staff to very good pulses. He remains NPO though another swallow evaluation will be pursued this morning. He has not been OOB. A PT/OT evaluation has been ordered.   Spoke to patients daughter, [name] about how the patient appeared this morning. She was thankful for the update and looks forward to hearing from the primary medical team.  [name] says that she would be open for him getting an NGT if he should remain to be unable to safely swallow.   Discussed with patient the importance of continued conversation with family and their  medical providers regarding overall plan of care and treatment options, ensuring decisions are within the context of the patients values and GOCs.  Questions and concerns addressed   Vital Signs Vitals:   09/15/19 0552 09/15/19 0800  BP: (!) 99/56 109/75  Pulse: (!) 114 97  Resp: 20 (!) 22  Temp:    SpO2: 94% 90%    Intake/Output Summary (Last 24 hours) at 09/15/2019 0942 Last data filed at 09/15/2019 0600 Gross per 24 hour  Intake 412.53 ml  Output 550 ml  Net -137.47 ml   Last Weight  Most recent update: 09/15/2019  6:07 AM   Weight  74.9 kg (165 lb 2 oz)           Gen:  Elderly M, foot off bed HEENT: moist mucous membranes CV: Irregular rate and regular rhythm, no murmurs rubs or gallops PULM: diminished to auscultation bilaterally. No wheezes/rales/rhonchi  ABD: soft/nontender/nondistended/normal bowel sounds  EXT: LLE tender, foot is  cool to the touch  Neuro: Disoriented  Recommendations and Plan:  Continue FULL code/FULL scope treatment  Doppler L foot  Repeat swallow evaluation this AM  Family would want a trial of NGT if needed  PT/OT ordered Time Spent: 25 Greater than 50% of the time was spent in counseling and coordination of care ______________________________________________________________________________________ [place] Team Team Cell Phone: [phone] Please utilize secure chat with additional questions, if there is no response within 30 minutes please call the above phone number  Palliative Medicine Team providers are available by phone from 7am to 7pm daily and can be reached through the team cell phone.  Should this patient require assistance outside of these hours, please call the patient's attending physician.

## 2019-09-15 NOTE — Evaluation (Signed)
Clinical/Bedside Swallow Evaluation Patient Details  Name: [name] MRN: [id number] Date of Birth: 1924/06/22  Today's Date: 09/15/2019 Time: SLP Start Time (ACUTE ONLY): 9 SLP Stop Time (ACUTE ONLY): 1550 SLP Time Calculation (min) (ACUTE ONLY): 42 min  Past Medical History:  Past Medical History:  Diagnosis Date  . Abnormal CT of the chest 07/21/2016   January 2018 IMPRESSION: 1. Residual patchy ground-glass opacities throughout both lungs at the resolved areas of consolidation from the 05/16/2016 chest CT, consistent with a resolving infectious or inflammatory process. 2. Bandlike opacity in the anterior right upper lobe is new since 05/16/2016, was probably present on 06/17/2016 chest radiograph, favor evolving postinfectious/postinflammatory   . Acute anemia 05/15/2019  . AKI (acute kidney injury) ([place])   . Allergic rhinitis 01/11/2019  . Anemia 09/16/2011  . Benign prostate hyperplasia 10/04/2011  . Bladder neck obstruction 10/04/2011  . Cerebrovascular accident (CVA) ([place]) 08/09/2016  . Chronic combined systolic (congestive) and diastolic (congestive) heart failure ([place])   . Chronic ITP (idiopathic thrombocytopenia) ([facility])   . Chronic kidney disease, stage 4 (severe) ([place]) 05/15/2019  . Coronary atherosclerosis of native coronary artery    a. Anterior STEMI 2009 s/p BMSx2 to mid & prox LAD and angioplasty to distal LAD. total RCA with collaterals, moderate Cx plaquing. a. NSTEMI 02/2018 - chronically occluded RCA, patent LAD stents and severe stenosis in the obtuse marginal branch treated with a drug eluting stent.  [name] COVID-19 09/05/2019  . Degenerative disk disease 12/20/2010   Sees Dr. Eddie Dibbles.  Has had MRI.   . Enlarged prostate with lower urinary tract symptoms (LUTS) 06/09/2011  . Gastritis and gastroduodenitis   . GERD (gastroesophageal reflux disease) 01/11/2019  . GI bleed 05/15/2019  . Gross hematuria   . Hemoptysis 05/16/2016  . History of GI bleed 03/17/2018   Admitted  03/17/18 with acute GI bleeding. EGD showed gastritis but no active bleeding. Aspirin stopped and Plavix and Xareltro continued.   [name] HTN (hypertension)   . Hyperlipidemia 04/23/2013  . Hypotension   . Iron deficiency anemia   . Ischemic cardiomyopathy   . Microscopic hematuria 09/28/2014  . Myocardial infarct ([place]) 03/21/2008  . NSTEMI (non-ST elevated myocardial infarction) ([place]) 05/16/2016  . NSVT (nonsustained ventricular tachycardia) ([place])    a. Per DC summary from time of STEMI 2009.  . Orthostatic hypotension 11/26/2016  . PAD (peripheral artery disease) ([place]) 08/13/2016  . Paroxysmal atrial flutter ([place])   . Persistent atrial fibrillation ([place])   . Phlebitis and thrombophlebitis of superficial vessels of lower extremities   . Pure hypercholesterolemia    a. Has not tolerated statins in the past.  . Right leg DVT ([place])    a. Dx 01/2014.  [name] Sinus bradycardia    a. By prior event monitor.   . Skin tear of forearm without complication 09/98/3382  . Symptomatic anemia   . Thrombocytopenia ([place])   . Urinary retention 06/20/2011   Past Surgical History:  Past Surgical History:  Procedure Laterality Date  . BIOPSY  05/17/2019   Procedure: BIOPSY;  Surgeon: Thornton Park, MD;  Location: [facility];  Service: Gastroenterology;;  . CARPAL TUNNEL RELEASE    . CORONARY STENT INTERVENTION N/A 03/06/2018   Procedure: CORONARY STENT INTERVENTION;  Surgeon: Lorretta Harp, MD;  Location: [facility];  Service: Cardiovascular;  Laterality: N/A;  . CORONARY STENT PLACEMENT  2009  . ESOPHAGOGASTRODUODENOSCOPY (EGD) WITH PROPOFOL N/A 03/19/2018   Procedure: ESOPHAGOGASTRODUODENOSCOPY (EGD) WITH PROPOFOL;  Surgeon: Wonda Horner,  MD;  Location: [facility];  Service: Endoscopy;  Laterality: N/A;  . ESOPHAGOGASTRODUODENOSCOPY (EGD) WITH PROPOFOL N/A 05/17/2019   Procedure: ESOPHAGOGASTRODUODENOSCOPY (EGD) WITH PROPOFOL;  Surgeon: Thornton Park, MD;  Location: [facility];  Service:  Gastroenterology;  Laterality: N/A;  . LEFT HEART CATH AND CORONARY ANGIOGRAPHY N/A 03/06/2018   Procedure: LEFT HEART CATH AND CORONARY ANGIOGRAPHY;  Surgeon: Lorretta Harp, MD;  Location: [facility];  Service: Cardiovascular;  Laterality: N/A;  . REPLACEMENT TOTAL KNEE BILATERAL     Left 2014 (Dr. Eddie Dibbles); right 2012 (Dr. Redmond Pulling)  . trigger finger surgery     HPI:  86 y.o. male presenting with continuation of altered mental status . PMH is significant for recent Covid diagnosis s/p hospitalization with remdesivir (4 doses), HTN, DVT, CVA, systolic and diastolic heart failure, chronic ITP, CKD.  Palliative care is following pt. Pt has had poor PO intake due to fluctuations in MS.  SLP was ordered to assess his swallowing function.    Assessment / Plan / Recommendation Clinical Impression  Limited clinical swallow assessment was completed.  HOB elevated, attempts were made to examine oral cavity; pt reluctant to allow examination.  Presented with intermittent ability to follow commands; there were no attempts to respond to questions verbally; there was occasional voicing.  With max encouragement, pt finally allowed me to assess oral cavity with flashlight, revealing significant, dried, tethered mucous covering palate, tongue, and pharynx.  He allowed me to remove portions of material from palate with tweezers. Further attempts were resisted; despite encouragement, pt tended to clench down on suctioning tubing, making if difficult to remove it from his mouth.  He accepted minimal ice chips/teaspoon of water, spitting out most of the water, coughing after swallowing what he could, but unable to fully expectorate the dried secretions in his posterior oral cavity.  D/W RN.  For now, please continue aggressive oral care if pt will allow - he may respond better to more frequent, shorter attempts at cleaning. Use oral suctioning warily given his tendency to clench on tubing - suction from left lateral  part of mouth where molars are absent and the [place] can slide through space between gums.  The situation is difficult - if pt's mental status allowed oral care, i anticipate he would be able to drink sips of water and and eat soft foods.  SLP will follow for ongoing assessment.    Attempted to call his daughter, Lelon Frohlich, to discuss assessment; voice mailbox was full. D/W Tacey Ruiz, who will convey this information to [place] during their next conversation. SLP Visit Diagnosis: Dysphagia, unspecified (R13.10)    Aspiration Risk       Diet Recommendation   NPO; please make frequent attempts at oral care       Other  Recommendations Oral Care Recommendations: Oral care QID   Follow up Recommendations        Frequency and Duration min 3x week  1 week       Prognosis Prognosis for Safe Diet Advancement: Guarded      Swallow Study   General HPI: 86 y.o. male presenting with continuation of altered mental status . PMH is significant for recent Covid diagnosis s/p hospitalization with remdesivir (4 doses), HTN, DVT, CVA, systolic and diastolic heart failure, chronic ITP, CKD.  Palliative care is following pt. Pt has had poor PO intake due to fluctuations in MS.  SLP was ordered to assess his swallowing function.  Type of Study: Bedside Swallow Evaluation Previous Swallow  Assessment: no Diet Prior to this Study: NPO Temperature Spikes Noted: No Respiratory Status: Nasal cannula(1L) History of Recent Intubation: No Behavior/Cognition: Alert;Requires cueing Oral Cavity Assessment: Dried secretions Oral Care Completed by SLP: Yes Oral Cavity - Dentition: Adequate natural dentition;Missing dentition Patient Positioning: Upright in bed Baseline Vocal Quality: Low vocal intensity Volitional Cough: Cognitively unable to elicit Volitional Swallow: Unable to elicit    Oral/Motor/Sensory Function Overall Oral Motor/Sensory Function: (symmetric at baseline)   Ice Chips Ice chips:  Impaired Presentation: Spoon Pharyngeal Phase Impairments: Wet Vocal Quality;Cough - Immediate   Thin Liquid Thin Liquid: Impaired Presentation: Spoon Pharyngeal  Phase Impairments: Cough - Immediate    Nectar Thick Nectar Thick Liquid: Not tested   Honey Thick Honey Thick Liquid: Not tested   Puree Puree: Not tested   Solid     Solid: Not tested      Juan Quam Laurice 09/15/2019,4:27 PM  Estill Bamberg L. Tivis Ringer, [place] Office number [phone] Pager [phone]

## 2019-09-15 NOTE — Progress Notes (Addendum)
Family Medicine Teaching Service Daily Progress Note Intern Pager: [phone]  Patient name: [name] Medical record number: [id number] Date of birth: 1924-05-06 Age: 86 y.o. Gender: male  Primary Care Provider: Leeanne Rio, MD Consultants: None Code Status: Full  [name]:  09/25/2019 admitted  Assessment and Plan: [name] is a 86 y.o. male presenting with continuation of altered mental status . PMH is significant for recent Covid diagnosis s/p hospitalization with remdesivir (4 doses), HTN, DVT, CVA, systolic and diastolic heart failure, chronic ITP, CKD  COVID-19+ pneumonia- stable Patient alert throughout encounter, unable to follow commands or give verbal response. Expresses pain with grimace and moan with palpation of L LE. Remains on 1.5L [place] with adequate O2 sats >92%. Lung exam limited without patient participation and contact stethoscope but some expiratory wheezing heard worse on Right.   - continue oxygen therapy as needed and attempt to wean to RA if tolerated in order to evaluate for dependence - continuous pulse ox - daily CBC, CMP, CRP, d-dimer - consider albuterol nebs/steroids to evaluate for improvement with wheezing  GOC- this continues to be a large, ongoing part of patient's care. Grateful to palliative team involvement with ongoing family discussions. Patient remains full code and have discussed bringing patient home if no improvement. Patient has remained NPO and SLP evaluation is ordered. Will need to decide with family on role of enteral feeding if patient is not able to tolerate PO and the risks/benefits of that intervention.  Spoke with patient's daughterWebb [name] on progress. She states that she has renewed hope after speaking with Dr. Chauncey [name] yesterday that he is going to make a full recovery and wants [place] to go ahead with NG tube if needed and proceed to PEG as was recommended by a family member who is a  Psychologist, sport and exercise. She tells me that this family member has been [name] the patient's charts and updating them on the notes.  - f/u palliative - f/u SLP  - f/u nutrition for recs if proceed with enteral feeds.  LE temperature discrepancy, suspect arterial blockage- left LE from mid shin and distally has decreased temperature compared to Right as well as pallor and poorly palpated pedal pulse. Patient grimaced with palpation of left lower leg. He is anticoagulated on lovenox as well as SCDs in place. D-dimer increased from admission and steady from yesterday (1.65>1.53>2.37>2.27). h/o DVT, HLD, CAD, Afib, chronic ITP,  - ABI stat this am and decide on further imaging vs vascular consult from there   Hypovolemic hypernatremia- resolved Na within normal limits today (146>144). Has been on D5 1/2 NS with 29mEq K+ at 13mL/hr overnight (just below maintenance rate of 142mL/hr). Patient still not taking PO as awaiting SLP evaluation and unsure if patient will be able to tolerate PO feeds. At this point, still needs maintenance fluids but since his fluid status has improved to a more euvolemic clinical picture and +5L since admission, will consider decreasing rate as to not cause overload with HFrEF. - continue current IV fluids at 125mL/hr and carefully monitor for signs of fluid overload.  - Consider repeat chest xray if respiratory status worsens to evaluate for edema - BMP daily + PRN  Afib  Tachycardia- chronic, rate controlled and anticoagulated HR 89-114 ON. On home dose IV equivalent of metoprolol with borderline hypotension. If patient is rate controlled and BP can tolerate this therapy, would continue. If BP is unable to tolerate, would consider changing to digoxin in  this setting for rate control and improved contractility. -continue lovenox per pharmacy consult -Continue Metoprolol IV   AKI- resolved Cr 1.22, UOP 570mL yesterday. Condom cath in place. Would expect greater UOP with fluid intake- h/o  BPH on flomax at home.  - post-void residual bladder scan today. Results determine if need for intervention, may need indwelling -daily BMP -Avoid nephrotoxic agents - strict I/O  HTN- chronic, moderately stable with borderline hypotension 109/75 this am -Continue Metoprolol IV, RN to call if concerned for hypotension -continue hold of home diuresis  Chronic Back Pain- unable to accurately assess pain due to mental status. Does not appear in acute pain or distress at baseline. - tylenol suppository PRN. Change to PO as able  HLD- chronic - holding home statin as NPO  Chronic ITP- Chronic. Platelets decreased 130>118 today. No signs of acute bleed but signs of LE thrombus as discussed above. - plan as above -CBC in am  GERD- Chronic. Stable.  - IV PPI for prolonged NPO  FEN/GI: Diet NPO except sips w/ meds, senna PRN, IV protonix PPx: lovenox per pharmacy consult  Disposition:  Ongoing palliative discussions with family for GOC  Subjective:  Patient alert, no other responsive movements  Objective: Temp:  [97.9 F (36.6 C)-99 F (37.2 C)] 97.9 F (36.6 C) (03/06 0452) Pulse Rate:  [89-114] 97 (03/06 0800) Resp:  [19-30] 22 (03/06 0800) BP: (92-109)/(49-75) 109/75 (03/06 0800) SpO2:  [90 %-96 %] 90 % (03/06 0800) Weight:  [74.9 kg-77.7 kg] 74.9 kg (03/06 0552) Physical Exam: General: older man, in NAD, resting in bed, alert but not oriented Cardiovascular: irregular rate, tachycardic. No m/r/g Respiratory: expiratory wheezing worse on left. No increased WOB, satting well on 1.5L O2 [place]. Abdomen: soft, NT, ND, normal BS present Extremities: no edema in bilateral LE,  R warm, dry, strong pedal pulse.  L cool, dry, pallor, weak pedal pulse. Pain with palpation of calf.   Laboratory: Recent Labs  Lab 09/13/19 0237 09/14/19 0304 09/15/19 0252  WBC 6.4 6.5 6.6  HGB 15.3 13.9 13.6  HCT 49.3 44.4 43.5  PLT 146* 130* 118*   Recent Labs  Lab 09/19/2019 1351  09/20/2019 2045 09/13/19 0237 09/13/19 0938 09/14/19 1117 09/14/19 1455 09/15/19 0252  NA 159*   < > 158*   < > 145 146* 144  K 4.1   < > 3.4*   < > 3.9 3.5 3.2*  CL 118*   < > 121*   < > 112* 111 110  CO2 26   < > 22   < > 25 26 23   BUN 58*   < > 43*   < > 26* 23 22  CREATININE 1.94*   < > 1.74*   < > 1.45* 1.37* 1.22  CALCIUM 9.5   < > 9.1   < > 8.5* 8.6* 8.4*  PROT 7.5  --  6.2*  --   --   --   --   BILITOT 2.4*  --  2.7*  --   --   --   --   ALKPHOS 90  --  69  --   --   --   --   ALT 25  --  28  --   --   --   --   AST 43*  --  58*  --   --   --   --   GLUCOSE 138*   < > 226*   < > 197* 185* 191*   < > =  values in this interval not displayed.    Imaging/Diagnostic Tests: No results found. Richarda Osmond, DO 09/15/2019, 9:36 AM PGY-2, [place] Intern pager: [phone], text pages welcome

## 2019-09-16 ENCOUNTER — Encounter (HOSPITAL_COMMUNITY): Payer: Medicare PPO

## 2019-09-16 DIAGNOSIS — G8929 Other chronic pain: Secondary | ICD-10-CM

## 2019-09-16 DIAGNOSIS — M549 Dorsalgia, unspecified: Secondary | ICD-10-CM

## 2019-09-16 LAB — BASIC METABOLIC PANEL
Anion gap: 8 (ref 5–15)
BUN: 18 mg/dL (ref 8–23)
CO2: 22 mmol/L (ref 22–32)
Calcium: 8.5 mg/dL — ABNORMAL LOW (ref 8.9–10.3)
Chloride: 114 mmol/L — ABNORMAL HIGH (ref 98–111)
Creatinine, Ser: 1.22 mg/dL (ref 0.61–1.24)
GFR calc Af Amer: 59 mL/min — ABNORMAL LOW (ref >60)
GFR calc non Af Amer: 51 mL/min — ABNORMAL LOW (ref >60)
Glucose, Bld: 199 mg/dL — ABNORMAL HIGH (ref 70–99)
Potassium: 3.5 mmol/L (ref 3.5–5.1)
Sodium: 144 mmol/L (ref 135–145)

## 2019-09-16 LAB — CBC
HCT: 42.1 % (ref 39.0–52.0)
Hemoglobin: 13.2 g/dL (ref 13.0–17.0)
MCH: 27.4 pg (ref 26.0–34.0)
MCHC: 31.4 g/dL (ref 30.0–36.0)
MCV: 87.5 fL (ref 80.0–100.0)
Platelets: 130 10*3/uL — ABNORMAL LOW (ref 150–400)
RBC: 4.81 MIL/uL (ref 4.22–5.81)
RDW: 14.6 % (ref 11.5–15.5)
WBC: 7.6 10*3/uL (ref 4.0–10.5)
nRBC: 0 % (ref 0.0–0.2)

## 2019-09-16 LAB — C-REACTIVE PROTEIN: CRP: 17.4 mg/dL — ABNORMAL HIGH (ref <1.0)

## 2019-09-16 MED ORDER — FENTANYL CITRATE (PF) 100 MCG/2ML IJ SOLN
12.5000 ug | INTRAMUSCULAR | Status: DC | PRN
  Administered 2019-09-16 – 2019-09-17 (×5): 12.5 ug via INTRAVENOUS
  Filled 2019-09-16 (×5): qty 2

## 2019-09-16 MED ORDER — PRO-STAT SUGAR FREE PO LIQD
30.0000 mL | Freq: Every day | ORAL | Status: DC
  Filled 2019-09-16: qty 30

## 2019-09-16 MED ORDER — OSMOLITE 1.5 CAL PO LIQD
1000.0000 mL | ORAL | Status: DC
  Filled 2019-09-16: qty 1000

## 2019-09-16 MED ORDER — HALOPERIDOL LACTATE 5 MG/ML IJ SOLN: 5.0000 mg | Freq: Once | INTRAMUSCULAR | Status: DC

## 2019-09-16 NOTE — Progress Notes (Signed)
Family Medicine Teaching Service Daily Progress Note Intern Pager: [phone]  Patient name: [name] Medical record number: [id number] Date of birth: 02/26/1924 Age: 86 y.o. Gender: male  Primary Care Provider: Leeanne Rio, MD Consultants: None Code Status: Full  [name]:  10/08/2019 admitted 09/15/19 transferred out of COVID floor (2/12 positive), L ABI 0.7  Assessment and Plan: [name] is a 86 y.o. male presenting with continuation of altered mental status . PMH is significant for recent Covid diagnosis s/p hospitalization with remdesivir (4 doses), HTN, DVT, CVA, systolic and diastolic heart failure, chronic ITP, CKD  COVID-19+ pneumonia- stable Patient alert, but not oriented: unable to follow commands or give verbal response. Expresses pain with grimace and moan. Patient remains stable with supportive care. Remains on 1.5L [place] with adequate O2 sats >92%. Lung exam limited without patient participation.  - continue oxygen therapy as needed and attempt to wean to RA if tolerated in order to evaluate for dependence - continuous pulse ox - daily CBC, CMP, CRP, d-dimer  GOC- this continues to be a large, ongoing part of patient's care. Grateful to palliative team involvement with ongoing family discussions. Patient remains full code and have discussed bringing patient home if no improvement. Patient has remained NPO. SLP evaluation yesterday,and pt unable to take PO due to mentation. Family would like to proceed with NGT and enteral feeds at this time. Discussed code status with daughter [name], who is at bedside. So far we are only providing supportive care, and patient has improved medically. He is not at baseline mentation, although he has improved since admission to initiate and maintain eye contact when his name is called. He had some mild dementia, but was driving himself as recently as last Tuesday. Patient had CT head on admission  with no acute abnormalities. Do not suspect stroke at this time. Hopeful that with supportive care in recovery from severe acute illness, patient will improve, if not exactly to baseline, at least more functional than he currently is. [name] mentioned that a family friend who is a [name] suggested the possiblity of a PEG tube, but [name] agrees that should be determined later. She wishes to see if patient will recover more mentation with continued supportive care.  - f/u palliative - place NGT  - nutrition recs for enteral feeds  LE temperature discrepancy, suspect arterial blockage- Yesterday left LE from mid shin and distally has decreased temperature compared to Right as well as pallor and poorly palpated pedal pulse. Today bilateral lower limbs are the same temperature and color, pulse still poorly palpated on left. ABIs obtained, R leg WNL, L leg 0.7 ABI. Will obtained vascular doppler ultrasound today.He is anticoagulated on lovenox as well as SCDs in place. D-dimer increased from admission and steady from yesterday (1.65>1.53>2.37>2.27). h/o DVT, HLD, CAD, Afib, chronic ITP. - doppler US r/o DVT  Hypovolemic hypernatremia- resolved Na within normal limits today (144). Has been on D5 1/2 NS with 49mEq K+ at 126mL/hr overnight (just below maintenance rate of 1104mL/hr).  - continue current IV fluids at 152mL/hr and carefully monitor for signs of fluid overload.  - Consider repeat chest xray if respiratory status worsens to evaluate for edema - BMP daily + PRN  Afib  Tachycardia- chronic, rate controlled and anticoagulated HR 89-114 ON. On home dose IV equivalent of metoprolol with borderline hypotension. If patient is rate controlled and BP can tolerate this therapy, would continue. If BP is unable to tolerate,  would consider changing to digoxin in this setting for rate control and improved contractility. Today, BP tolerating: SBP 104-119/DBP 50-119, most recently 137/119 -continue lovenox per  pharmacy consult -Continue Metoprolol IV   AKI- resolved Cr 1.22, UOP 527mL on 3/5. Condom cath in place. Would expect greater UOP with fluid intake- h/o BPH on flomax at home. No UOP charted today, requested RN to chart and let MD team know. Will order bladder scan if no/limited UOP. - Will do bladder scan if needed. -daily BMP -Avoid nephrotoxic agents - strict I/O  HTN- chronic, moderately stable with borderline hypotension 105/63 this am -Continue Metoprolol IV, parameters to hold if MAP <65 -continue hold of home diuresis  Chronic Back Pain- unable to accurately assess pain due to mental status. Does not appear in acute pain or distress at baseline. - tylenol suppository PRN. Change to PO as able  HLD- chronic - holding home statin as NPO  Chronic ITP- Chronic. Platelets inccreased 118>130 today. No signs of acute bleed but signs of LE thrombus as discussed above. - plan as above -CBC in am  GERD- Chronic. Stable.  - IV PPI for prolonged NPO  FEN/GI: Diet NPO except sips w/ meds, senna PRN, IV protonix PPx: lovenox per pharmacy consult  Disposition:  Ongoing palliative discussions with family for [place]  Subjective:  Patient alert, no other responsive movements  Objective: Temp:  [98.8 F (37.1 C)-99.4 F (37.4 C)] 98.8 F (37.1 C) (03/06 2145) Pulse Rate:  [97-121] 98 (03/06 2145) Resp:  [21-27] 27 (03/06 2100) BP: (104-121)/(50-75) 105/63 (03/07 0526) SpO2:  [90 %-97 %] 92 % (03/06 2145) Weight:  [84 kg] 84 kg (03/07 0526) Physical Exam: General: older man, in NAD, resting in bed, alert but not oriented Cardiovascular: irregular rate, tachycardic. No m/r/g Respiratory: expiratory wheezing worse on left. No increased WOB, satting well on 1.5L O2 [place]. Abdomen: soft, NT, ND, normal BS present Extremities: no edema in bilateral LE,  R warm, dry, strong pedal pulse.  L warm, dry, weak pedal pulse.   Laboratory: Recent Labs  Lab 09/13/19 0237  09/14/19 0304 09/15/19 0252  WBC 6.4 6.5 6.6  HGB 15.3 13.9 13.6  HCT 49.3 44.4 43.5  PLT 146* 130* 118*   Recent Labs  Lab 09/10/2019 1351 10/02/2019 2045 09/13/19 0237 09/13/19 0938 09/14/19 1117 09/14/19 1455 09/15/19 0252  NA 159*   < > 158*   < > 145 146* 144  K 4.1   < > 3.4*   < > 3.9 3.5 3.2*  CL 118*   < > 121*   < > 112* 111 110  CO2 26   < > 22   < > 25 26 23   BUN 58*   < > 43*   < > 26* 23 22  CREATININE 1.94*   < > 1.74*   < > 1.45* 1.37* 1.22  CALCIUM 9.5   < > 9.1   < > 8.5* 8.6* 8.4*  PROT 7.5  --  6.2*  --   --   --   --   BILITOT 2.4*  --  2.7*  --   --   --   --   ALKPHOS 90  --  69  --   --   --   --   ALT 25  --  28  --   --   --   --   AST 43*  --  58*  --   --   --   --  GLUCOSE 138*   < > 226*   < > 197* 185* 191*   < > = values in this interval not displayed.    Imaging/Diagnostic Tests: VAS [place] ABI WITH/WO TBI  Result Date: 09/15/2019 LOWER EXTREMITY DOPPLER STUDY Indications: Cool extremity, pain in calf when squeezed. Other Factors: Covid-19                Bandage and wound on left shin.  Limitations: Today's exam was limited due to patient positioning, involuntary              patient movement and Altered mental status, patient's arms bent              across chest, constant movement of legs. Comparison Study: Prior study from 08/10/16 is available for comparison Performing Technologist: Sharion Dove RVS  Examination Guidelines: A complete evaluation includes at minimum, Doppler waveform signals and systolic blood pressure reading at the level of bilateral brachial, anterior tibial, and posterior tibial arteries, when vessel segments are accessible. Bilateral testing is considered an integral part of a complete examination. Photoelectric Plethysmograph (PPG) waveforms and toe systolic pressure readings are included as required and additional duplex testing as needed. Limited examinations for reoccurring indications may be performed as noted.  ABI  Findings: +---------+------------------+-----+--------+----------------------------------+ Right    Rt Pressure (mmHg)IndexWaveformComment                            +---------+------------------+-----+--------+----------------------------------+ Brachial 109                            unable to attain waveform                                                  secondary to patient's arm                                                 position                           +---------+------------------+-----+--------+----------------------------------+ PTA                                     unable to attain secondary to                                              patient positioning and AMS        +---------+------------------+-----+--------+----------------------------------+ DP       113               1.04 biphasic                                   +---------+------------------+-----+--------+----------------------------------+ Great Toe88                0.81                                            +---------+------------------+-----+--------+----------------------------------+ +---------+------------------+-----+--------+----------------------------------+  Left     Lt Pressure (mmHg)IndexWaveformComment                            +---------+------------------+-----+--------+----------------------------------+ Brachial                                unable to attain pressure/waveform                                         secondary to patient's arm                                                 position.                          +---------+------------------+-----+--------+----------------------------------+ PTA      77                0.71 biphasic                                   +---------+------------------+-----+--------+----------------------------------+ DP       71                0.65 biphasic                                    +---------+------------------+-----+--------+----------------------------------+ Great Toe42                0.39                                            +---------+------------------+-----+--------+----------------------------------+ +-------+-----------+-----------+------------+------------+ ABI/TBIToday's ABIToday's TBIPrevious ABIPrevious TBI +-------+-----------+-----------+------------+------------+ Right  1.04       0.81       1.15                     +-------+-----------+-----------+------------+------------+ Left   0.71       0.39       0.95                     +-------+-----------+-----------+------------+------------+ Right ABIs appear essentially unchanged. Left ABIs appear decreased.  Summary: Right: Resting right ankle-brachial index is within normal range. No evidence of significant right lower extremity arterial disease. The right toe-brachial index is normal. Left: Resting left ankle-brachial index indicates moderate left lower extremity arterial disease. The left toe-brachial index is abnormal.  *See table(s) above for measurements and observations.  Electronically signed by Monica Martinez MD on 09/15/2019 at 1:45:35 PM.    Final    Gladys Damme, MD 09/16/2019, 7:54 AM PGY-1, [place] Intern pager: [phone], text pages welcome

## 2019-09-16 NOTE — Progress Notes (Signed)
   Palliative Medicine Inpatient Follow Up Note   HPI: Alan Bakeris a 86 y.o.malepresenting with continuation of altered mental status. PMH is significant forrecent Covid diagnosis s/p hospitalization with remdesivir (4 doses), HTN, DVT, CVA,systolic and diastolic heart failure, chronic ITP, CK.   Today's Discussion (09/14/2019): Chart reviewed. Evaluated [name] this morning, he remains quite altered. He has been moved to 2W which now enables his daughter, Anne to visit. I spoke with her via telephone and we plan to meet this morning to further discuss [place] goals. As of yesterday he was unable to participate in a swallow evaluation. ABI studies were also completed given concern for LLE occlusion.   I met with Anne at bedside this late morning. She said that [name] looked better than she expected. She had just received an update from the primary medical team prior to my arrival. Anne said that she was still hopeful, but she and her family are realistic and realize we may be having a very different conversation in a few days. She said that the family medicine team had reviewed full code status with her and recommended reconsidering that. Anne plans to speak with her family about this throughout the evening and inform the team tomorrow of their decision.  In regards to nutrition Anne said that if the medical team thinks it is a reasonable idea that she would consent  to an NGT for her father.   She is very thankful for all of the efforts to improve [name]'s condition though if he should lack in improvement she would be open to hospice.   Vital Signs Vitals:   09/16/19 0526 09/16/19 0810  BP: 105/63 (!) 137/119  Pulse:  78  Resp:  (!) 22  Temp:  98.2 F (36.8 C)  SpO2:  90%   No intake or output data in the 24 hours ending 09/16/19 0942 Last Weight  Most recent update: 09/16/2019  5:26 AM   Weight  84 kg (185 lb 3 oz)           Gen:  Elderly M, foot off  bed HEENT: dry mucous membranes CV: Irregular rate and regular rhythm, no murmurs rubs or gallops PULM: diminished to auscultation bilaterally. No wheezes/rales/rhonchi  ABD: soft/nontender/nondistended/normal bowel sounds  EXT: No edema  Neuro: Disoriented  Recommendations and Plan:  Continue FULL code/FULL scope treatment --> Will check in tomorrow to identify what the family decided r/t code status  Family would want a trial of NGT feeding if the medical tam deemed it as appropriate  Good oral hygiene BID  PT/OT  Delirium precuations  Time Spent: 35 Greater than 50% of the time was spent in counseling and coordination of care ______________________________________________________________________________________   [place] Palliative Medicine Team Team Cell Phone: [phone] Please utilize secure chat with additional questions, if there is no response within 30 minutes please call the above phone number  Palliative Medicine Team providers are available by phone from 7am to 7pm daily and can be reached through the team cell phone.  Should this patient require assistance outside of these hours, please call the patient's attending physician.     

## 2019-09-16 NOTE — Progress Notes (Signed)
   Vital Signs MEWS/VS Documentation      09/16/2019 0800 09/16/2019 0810 09/16/2019 1517 09/16/2019 1727   MEWS Score:  2  1  1  2    MEWS Score Color:  Yellow  Green  Green  Yellow   Resp:  -  (!) 22  -  (!) 24   Pulse:  -  78  -  (!) 104   BP:  -  (!) 137/119  -  (!) 104/56   Temp:  -  98.2 F (36.8 C)  -  99.1 F (37.3 C)   Level of Consciousness:  Alert  -  Alert  -      Patient extremely agitated with increased RR and BP due to pain. Agitation increased with attempting NGT placement and cleaning patient. Administered Fentanyl. Will continue to monitor.     [name] 09/16/2019,6:58 PM

## 2019-09-16 NOTE — Evaluation (Signed)
Physical Therapy Evaluation Patient Details Name: [name] MRN: [id number] DOB: 06/03/1924 Today's Date: 09/16/2019   History of Present Illness  86 y.o. male presenting with continuation of altered mental status and admitted for pneumonia. PMH is significant for recent Covid diagnosis s/p hospitalization with remdesivir (4 doses), HTN, DVT, CVA, systolic and diastolic heart failure, chronic ITP, CK.    Clinical Impression  Pt admitted with above diagnosis. On eval, pt required total assist bed mobility. Pt resistive to all mobility and ROM attempts, likely due to pain. RN/MD aware with pain meds being ordered. Pt's eyes closed throughout session. No engagement with therapist. Pt currently with functional limitations due to the deficits listed below (see PT Problem List). Pt will benefit from skilled PT to increase their independence and safety with mobility to allow discharge to the venue listed below.  Daughter present in room at beginning of session but left to make phone calls. Therefore, did not discuss discharge recommendations. If family declines SNF, pt will need 24-hour assist and PT to further address home DME.     Follow Up Recommendations SNF;Supervision/Assistance - 24 hour    Equipment Recommendations  Other (comment)(TBD)    Recommendations for Other Services       Precautions / Restrictions Precautions Precautions: Other (comment);Fall Precaution Comments: watch sats Restrictions Weight Bearing Restrictions: No      Mobility  Bed Mobility Overal bed mobility: Needs Assistance Bed Mobility: Rolling;Sidelying to Sit Rolling: Total assist Sidelying to sit: Total assist       General bed mobility comments: total assist bed mobility for positioning. Attempted sidelying to sit, achieving approx 50% of transfer. Required return to bed due to resistance from pt, likely pain response.  Transfers                 General transfer comment:  unable  Ambulation/Gait                Stairs            Wheelchair Mobility    Modified Rankin (Stroke Patients Only)       Balance                                             Pertinent Vitals/Pain Pain Assessment: Faces Faces Pain Scale: Hurts whole lot Pain Location: generalized with mobility attempts Pain Descriptors / Indicators: Grimacing;Guarding;Moaning;Discomfort Pain Intervention(s): Limited activity within patient's tolerance;Repositioned;Monitored during session;Other (comment)(RN to give pain meds after eval)    Home Living Family/patient expects to be discharged to:: Private residence Living Arrangements: Other relatives Available Help at Discharge: Family;Available 24 hours/day Type of Home: House Home Access: Stairs to enter Entrance Stairs-Rails: Psychiatric nurse of Steps: 5 Home Layout: One level Home Equipment: Cane - single point;Walker - 2 wheels;Transport chair;Shower seat;Hand held shower head;Bedside commode;Adaptive equipment Additional Comments: History taken from previous admission (Nov 2020)    Prior Function Level of Independence: Needs assistance   Gait / Transfers Assistance Needed: difficulty rising from low surfaces, bed, chairs and toilet are all elevated  ADL's / Homemaking Assistance Needed: daughter helps with LB dressing, showering, heavy meal prep and housekeeping  Comments: pt drives, gets his own dry cleaning     Hand Dominance   Dominant Hand: Right    Extremity/Trunk Assessment   Upper Extremity Assessment Upper Extremity Assessment: Defer to OT evaluation  Lower Extremity Assessment Lower Extremity Assessment: Difficult to assess due to impaired cognition(Pt holding BLE in flexed position. Difficult to assess if full ROM present due to pt resistive to movement.)       Communication   Communication: HOH  Cognition Arousal/Alertness: Lethargic Behavior During  Therapy: Flat affect;Agitated Overall Cognitive Status: Difficult to assess                                 General Comments: Eyes closed. Moaning throughout session. Resistive to mobility, likely as pain response.      General Comments General comments (skin integrity, edema, etc.): Pt on 1.5L O2 on arrival with SpO2 80%. O2 increased to 3L with improvement to 90%. RN notified.    Exercises     Assessment/Plan    PT Assessment Patient needs continued PT services  PT Problem List Decreased strength;Decreased mobility;Decreased range of motion;Decreased activity tolerance;Cardiopulmonary status limiting activity;Pain       PT Treatment Interventions DME instruction;Therapeutic activities;Therapeutic exercise;Patient/family education;Balance training;Functional mobility training;Gait training;Cognitive remediation    PT Goals (Current goals can be found in the Care Plan section)  Acute Rehab PT Goals Patient Stated Goal: not stated PT Goal Formulation: Patient unable to participate in goal setting Time For Goal Achievement: 09/30/19 Potential to Achieve Goals: Fair    Frequency Min 2X/week   Barriers to discharge        Co-evaluation               AM-PAC PT "6 Clicks" Mobility  Outcome Measure Help needed turning from your back to your side while in a flat bed without using bedrails?: Total Help needed moving from lying on your back to sitting on the side of a flat bed without using bedrails?: Total Help needed moving to and from a bed to a chair (including a wheelchair)?: Total Help needed standing up from a chair using your arms (e.g., wheelchair or bedside chair)?: Total Help needed to walk in hospital room?: Total Help needed climbing 3-5 steps with a railing? : Total 6 Click Score: 6    End of Session Equipment Utilized During Treatment: Oxygen Activity Tolerance: Patient limited by pain;Patient limited by lethargy Patient left: in bed;with  call bell/phone within reach;with bed alarm set Nurse Communication: Mobility status;Other (comment)(SpO2) PT Visit Diagnosis: Other abnormalities of gait and mobility (R26.89);Pain;Muscle weakness (generalized) (M62.81)    Time: 0034-9179 PT Time Calculation (min) (ACUTE ONLY): 18 min   Charges:   PT Evaluation $PT Eval Moderate Complexity: 1 Mod          Lorrin Goodell, PT  Office # [phone] Pager [phone]   Lorriane Shire 09/16/2019, 4:16 PM

## 2019-09-16 NOTE — Progress Notes (Signed)
Attempted to position patient for NGT insertion. Patient became very agitated and kept reaching for my hands, which he held. He is non-verbal, but had a tear in his eye and seemed as if he was trying to communicate. Patient was given IV Fentanyl with no improvement. I spoke with his daughter and let her know that he was unable to tolerate being positioned for tube placement and he would likely need sedation and radiological insertion to be successful. She understood. Primary nurse will continue to provide monitoring and support.

## 2019-09-16 NOTE — Progress Notes (Signed)
NT attempted bladder scan, patient is agitated and unable to lay on his back or lay still. Even with 2 assist we were unable to complete bladder scan. Urine output has been documented.

## 2019-09-17 ENCOUNTER — Encounter (HOSPITAL_COMMUNITY): Payer: Medicare PPO

## 2019-09-17 DIAGNOSIS — R627 Adult failure to thrive: Secondary | ICD-10-CM

## 2019-09-17 LAB — BASIC METABOLIC PANEL
Anion gap: 9 (ref 5–15)
BUN: 18 mg/dL (ref 8–23)
CO2: 20 mmol/L — ABNORMAL LOW (ref 22–32)
Calcium: 7.6 mg/dL — ABNORMAL LOW (ref 8.9–10.3)
Chloride: 117 mmol/L — ABNORMAL HIGH (ref 98–111)
Creatinine, Ser: 1.28 mg/dL — ABNORMAL HIGH (ref 0.61–1.24)
GFR calc Af Amer: 56 mL/min — ABNORMAL LOW (ref >60)
GFR calc non Af Amer: 48 mL/min — ABNORMAL LOW (ref >60)
Glucose, Bld: 159 mg/dL — ABNORMAL HIGH (ref 70–99)
Potassium: 3.6 mmol/L (ref 3.5–5.1)
Sodium: 146 mmol/L — ABNORMAL HIGH (ref 135–145)

## 2019-09-17 LAB — CBC
HCT: 38.6 % — ABNORMAL LOW (ref 39.0–52.0)
Hemoglobin: 12.1 g/dL — ABNORMAL LOW (ref 13.0–17.0)
MCH: 27.3 pg (ref 26.0–34.0)
MCHC: 31.3 g/dL (ref 30.0–36.0)
MCV: 86.9 fL (ref 80.0–100.0)
Platelets: 131 10*3/uL — ABNORMAL LOW (ref 150–400)
RBC: 4.44 MIL/uL (ref 4.22–5.81)
RDW: 14.6 % (ref 11.5–15.5)
WBC: 7.9 10*3/uL (ref 4.0–10.5)
nRBC: 0.3 % — ABNORMAL HIGH (ref 0.0–0.2)

## 2019-09-17 LAB — C-REACTIVE PROTEIN: CRP: 15.5 mg/dL — ABNORMAL HIGH (ref <1.0)

## 2019-09-17 LAB — HEPARIN ANTI-XA: Heparin LMW: 0.36 IU/mL

## 2019-09-17 LAB — GLUCOSE, CAPILLARY: Glucose-Capillary: 127 mg/dL — ABNORMAL HIGH (ref 70–99)

## 2019-09-17 MED ORDER — LIDOCAINE 5 % EX PTCH
1.0000 | MEDICATED_PATCH | CUTANEOUS | Status: DC
  Administered 2019-09-17 – 2019-09-18 (×2): 1 via TRANSDERMAL
  Filled 2019-09-17 (×2): qty 1

## 2019-09-17 MED ORDER — FENTANYL CITRATE (PF) 100 MCG/2ML IJ SOLN
25.0000 ug | INTRAMUSCULAR | Status: DC
  Administered 2019-09-17 – 2019-09-18 (×4): 25 ug via INTRAMUSCULAR
  Filled 2019-09-17 (×4): qty 2

## 2019-09-17 MED ORDER — FENTANYL CITRATE (PF) 100 MCG/2ML IJ SOLN
25.0000 ug | INTRAMUSCULAR | Status: DC
  Administered 2019-09-17: 25 ug via INTRAVENOUS
  Filled 2019-09-17: qty 2

## 2019-09-17 MED ORDER — FENTANYL CITRATE (PF) 100 MCG/2ML IJ SOLN
25.0000 ug | Freq: Once | INTRAMUSCULAR | Status: AC
  Administered 2019-09-17: 25 ug via INTRAVENOUS
  Filled 2019-09-17: qty 2

## 2019-09-17 MED ORDER — ENOXAPARIN SODIUM 100 MG/ML ~~LOC~~ SOLN
1.0000 mg/kg | Freq: Two times a day (BID) | SUBCUTANEOUS | Status: DC
  Administered 2019-09-17 – 2019-09-18 (×2): 85 mg via SUBCUTANEOUS
  Filled 2019-09-17 (×2): qty 1

## 2019-09-17 NOTE — Progress Notes (Addendum)
Family Medicine Teaching Service Daily Progress Note Intern Pager: [phone]  Patient name: [name] Medical record number: [id number] Date of birth: 1924/02/24 Age: 86 y.o. Gender: male  Primary Care Provider: Leeanne Rio, MD Consultants: None Code Status: Full  [name]:  10/06/2019 admitted 09/15/19 transferred out of COVID floor (2/12 positive), L ABI 0.7  Assessment and Plan: [name] is a 86 y.o. male presenting with continuation of altered mental status . PMH is significant for recent Covid diagnosis s/p hospitalization with remdesivir (4 doses), HTN, DVT, CVA, systolic and diastolic heart failure, chronic ITP, CKD  COVID-19+ pneumonia- stable Patient alert, but not oriented: unable to follow commands or give verbal response. Expresses pain with grimace and moan. Patient remains stable with supportive care. Remains on 1.5L [place] with adequate O2 sats >92%. Lung exam limited without patient participation.  - continue oxygen therapy as needed and attempt to wean to RA if tolerated in order to evaluate for dependence - continuous pulse ox - daily CBC, BMP, CRP  GOC- this continues to be a large, ongoing part of patient's care. Grateful to palliative team involvement with ongoing family discussions. Patient remains full code and have discussed bringing patient home if no improvement. Patient has remained NPO. SLP evaluation 3/6, and pt unable to take PO due to mentation. Family would like to proceed with NGT and enteral feeds at this time. Attempted to place NGT yesterday, unsuccessful even with fentanyl. Requested to try NGT placement with haldol, has not yet been attempted, will discuss options with family today and coordinate with nursing for best chance of success. Discussed code status with daughter [name] yesterday. So far we are only providing supportive care, and patient has improved medically. He is not at baseline mentation,  although he has improved since admission to initiate and maintain eye contact when his name is called. He had some mild dementia, but was driving himself as recently as last Tuesday. Patient had CT head on admission with no acute abnormalities. Do not suspect stroke at this time. Hopeful that with supportive care in recovery from severe acute illness, patient will improve, if not exactly to baseline, at least more functional than he currently is.  - f/u palliative - discuss options for nutrition and obtaining access with family - nutrition consult   LE temperature discrepancy, suspect arterial blockage- 3/6 left LE from mid shin and distally had decreased temperature compared to Right as well as pallor and poorly palpated pedal pulse. Today bilateral lower limbs are the same temperature and color, pulse still poorly palpated on left. ABIs obtained, R leg WNL, L leg 0.7 ABI. Next vascular US step is to obtain a doppler duplex, however, the sonography techs are unable to obtain this study due to patient's mentation (must hold still, etc). Leg looks improved today, most concerned for pain. He is anticoagulated on lovenox as well as SCDs in place. D-dimer increased from admission and steady  (1.65>1.53>2.37>2.27). h/o DVT, HLD, CAD, Afib, chronic ITP. -continue lovenox for dvt ppx, SCDs  Hypovolemic hypernatremia- resolved Na mildly elevated to 146 today, potassium WNL at 3.6. Has been on D5 1/2 NS with 80mEq K+ at 75mL/hr overnight. Will increase to 100 ml/hr today d/t mildly elevated sodium (NPO), no signs of volume overload, patient still appears dry. - increase IV fluids at to 100 mL/hr and carefully monitor for signs of fluid overload.  - Consider repeat chest xray if respiratory status worsens to evaluate for  edema - BMP daily + PRN  Afib  Tachycardia- chronic, rate controlled and anticoagulated HR 78-104 ON. On home dose IV equivalent of metoprolol with borderline hypotension. If patient is rate  controlled and BP can tolerate this therapy, would continue. If BP is unable to tolerate, would consider changing to digoxin in this setting for rate control and improved contractility. Today, BP tolerating: SBP 104-137/DBP 56-119, most recently 109/57 -continue lovenox per pharmacy consult -Continue Metoprolol IV   AKI- resolved Cr around baseline 1.28, UOP 541mL on 3/5. Nothing charted after that except 180mL and an unmeasured occurrence. Urine bag in place, unable to obtain bladder scan yesterday. h/o BPH on flomax at home. Will reattempt bladder scan, if unable, will consider in&out cath. - Will do bladder scan -consider I&O cath -daily BMP -Avoid nephrotoxic agents - strict I/O  HTN- chronic, moderately stable with borderline hypotension 109/57 this am -Continue Metoprolol IV, parameters to hold if MAP <65 -continue hold of home diuresis  Chronic Back Pain- unable to accurately assess pain due to mental status. Does not appear in acute pain or distress at baseline. - tylenol suppository PRN. Change to PO as able  HLD- chronic - holding home statin as NPO  Chronic ITP- Chronic. Platelets 131 today. No signs of acute bleed but signs of LE thrombus as discussed above. - plan as above -CBC in am  GERD- Chronic. Stable.  - IV PPI for prolonged NPO  FEN/GI: Diet NPO except sips w/ meds, senna PRN, IV protonix PPx: lovenox per pharmacy consult  Disposition:  Ongoing palliative discussions with family for GOC  Subjective:  Patient alert, no other responsive movements  Objective: Temp:  [98.2 F (36.8 C)-99.1 F (37.3 C)] 99.1 F (37.3 C) (03/07 2318) Pulse Rate:  [78-104] 95 (03/07 2318) Resp:  [20-24] 20 (03/07 2318) BP: (104-137)/(56-119) 109/57 (03/07 2318) SpO2:  [90 %-99 %] 99 % (03/07 2318) Physical Exam: General: older man, in NAD, resting in bed, alert but not oriented, unable to follow commands Cardiovascular: irregular rate, tachycardic. No  m/r/g Respiratory: CTAB. No increased WOB, satting well on 1.5L O2 [place]. Abdomen: soft, NT, ND, normal BS present Extremities: no edema in bilateral LE,  R warm, dry, strong pedal pulse.  L warm, dry, weak pedal pulse.   Laboratory: Recent Labs  Lab 09/14/19 0304 09/15/19 0252 09/16/19 0915  WBC 6.5 6.6 7.6  HGB 13.9 13.6 13.2  HCT 44.4 43.5 42.1  PLT 130* 118* 130*   Recent Labs  Lab 09/27/2019 1351 10/08/2019 2045 09/13/19 0237 09/13/19 0938 09/14/19 1455 09/15/19 0252 09/16/19 0915  NA 159*   < > 158*   < > 146* 144 144  K 4.1   < > 3.4*   < > 3.5 3.2* 3.5  CL 118*   < > 121*   < > 111 110 114*  CO2 26   < > 22   < > 26 23 22   BUN 58*   < > 43*   < > 23 22 18   CREATININE 1.94*   < > 1.74*   < > 1.37* 1.22 1.22  CALCIUM 9.5   < > 9.1   < > 8.6* 8.4* 8.5*  PROT 7.5  --  6.2*  --   --   --   --   BILITOT 2.4*  --  2.7*  --   --   --   --   ALKPHOS 90  --  69  --   --   --   --  ALT 25  --  28  --   --   --   --   AST 43*  --  58*  --   --   --   --   GLUCOSE 138*   < > 226*   < > 185* 191* 199*   < > = values in this interval not displayed.    Imaging/Diagnostic Tests: No results found. Gladys Damme, MD 09/17/2019, 6:21 AM PGY-1, [place] Intern pager: [phone], text pages welcome

## 2019-09-17 NOTE — Plan of Care (Signed)
  Problem: Respiratory: Goal: Will maintain a patent airway Outcome: Progressing   Problem: Safety: Goal: Ability to remain free from injury will improve Outcome: Progressing   Problem: Nutrition: Goal: Adequate nutrition will be maintained Outcome: Not Progressing   Problem: Pain Managment: Goal: General experience of comfort will improve Outcome: Not Progressing

## 2019-09-17 NOTE — TOC Progression Note (Signed)
Transition of Care [facility]) - Progression Note    Patient Details  Name: [name] MRN: [id number] Date of Birth: 10/21/1923  Transition of [facility]) CM/SW [place], LCSW Phone Number: 09/17/2019, 1:58 PM  Clinical Narrative:     Per MD note patient does not appear to be medically ready for discharge to SNF. Patient is still requiring tube feeding as he is having poor PO intake. Plan is for NGT placement under sedation with IR. Family is in support of supportive measures. TOC will continue to follow.        Expected Discharge Plan and Services                                                 Social Determinants of Health (SDOH) Interventions    Readmission Risk Interventions No flowsheet data found.

## 2019-09-17 NOTE — Progress Notes (Addendum)
ANTICOAGULATION CONSULT NOTE - Consult  Pharmacy Consult for Enoxaparin Indication: atrial fibrillation  Allergies  Allergen Reactions  . Gabapentin Other (See Comments)    Confusion and caused falls  . Tape Other (See Comments)    SKIN IS THIN AND TEARS EASILY!! PLEASE USE AN ALTERNATIVE!!  . Eliquis [Apixaban] Swelling and Other (See Comments)    Causes swollen ankles and blot clot in eyes  . Prednisone Other (See Comments)    "Made me not feel right when I took it"  . Rosuvastatin Other (See Comments)    "Makes me ache"    Patient Measurements: Height: 5\' 11"  (180.3 cm) Weight: 185 lb 3 oz (84 kg) IBW/kg (Calculated) : 75.3  Vital Signs: Temp: 98.9 F (37.2 C) (03/08 1558) BP: 112/82 (03/08 1558) Pulse Rate: 92 (03/08 1558)  Labs: Recent Labs    09/15/19 0252 09/15/19 0252 09/16/19 0915 09/17/19 0620 09/17/19 1716  HGB 13.6   < > 13.2 12.1*  --   HCT 43.5  --  42.1 38.6*  --   PLT 118*  --  130* 131*  --   HEPRLOWMOCWT  --   --   --   --  0.36  CREATININE 1.22  --  1.22 1.28*  --    < > = values in this interval not displayed.    Estimated Creatinine Clearance: 38.4 mL/min (A) (by C-G formula based on SCr of 1.28 mg/dL (H)).   Medical History: Past Medical History:  Diagnosis Date  . Abnormal CT of the chest 07/21/2016   January 2018 IMPRESSION: 1. Residual patchy ground-glass opacities throughout both lungs at the resolved areas of consolidation from the 05/16/2016 chest CT, consistent with a resolving infectious or inflammatory process. 2. Bandlike opacity in the anterior right upper lobe is new since 05/16/2016, was probably present on 06/17/2016 chest radiograph, favor evolving postinfectious/postinflammatory   . Acute anemia 05/15/2019  . AKI (acute kidney injury) ([place])   . Allergic rhinitis 01/11/2019  . Anemia 09/16/2011  . Benign prostate hyperplasia 10/04/2011  . Bladder neck obstruction 10/04/2011  . Cerebrovascular accident (CVA) ([place]) 08/09/2016  .  Chronic combined systolic (congestive) and diastolic (congestive) heart failure ([place])   . Chronic ITP (idiopathic thrombocytopenia) ([facility])   . Chronic kidney disease, stage 4 (severe) ([place]) 05/15/2019  . Coronary atherosclerosis of native coronary artery    a. Anterior STEMI 2009 s/p BMSx2 to mid & prox LAD and angioplasty to distal LAD. total RCA with collaterals, moderate Cx plaquing. a. NSTEMI 02/2018 - chronically occluded RCA, patent LAD stents and severe stenosis in the obtuse marginal branch treated with a drug eluting stent.  [name] COVID-19 09/05/2019  . Degenerative disk disease 12/20/2010   Sees Dr. Eddie Dibbles.  Has had MRI.   . Enlarged prostate with lower urinary tract symptoms (LUTS) 06/09/2011  . Gastritis and gastroduodenitis   . GERD (gastroesophageal reflux disease) 01/11/2019  . GI bleed 05/15/2019  . Gross hematuria   . Hemoptysis 05/16/2016  . History of GI bleed 03/17/2018   Admitted 03/17/18 with acute GI bleeding. EGD showed gastritis but no active bleeding. Aspirin stopped and Plavix and Xareltro continued.   [name] HTN (hypertension)   . Hyperlipidemia 04/23/2013  . Hypotension   . Iron deficiency anemia   . Ischemic cardiomyopathy   . Microscopic hematuria 09/28/2014  . Myocardial infarct ([place]) 03/21/2008  . NSTEMI (non-ST elevated myocardial infarction) ([place]) 05/16/2016  . NSVT (nonsustained ventricular tachycardia) ([place])    a. Per DC summary from time  of STEMI 2009.  . Orthostatic hypotension 11/26/2016  . PAD (peripheral artery disease) ([place]) 08/13/2016  . Paroxysmal atrial flutter ([place])   . Persistent atrial fibrillation ([place])   . Phlebitis and thrombophlebitis of superficial vessels of lower extremities   . Pure hypercholesterolemia    a. Has not tolerated statins in the past.  . Right leg DVT ([place])    a. Dx 01/2014.  [name] Sinus bradycardia    a. By prior event monitor.   . Skin tear of forearm without complication 58/30/9407  . Symptomatic anemia   . Thrombocytopenia ([place])   .  Urinary retention 06/20/2011    Assessment: [name] is a 86 y.o. male who presented with altered mental status . PMH is significant for recent COVID diagnosis (S/P hospitalization with remdesivir), HTN, DVT, CVA, systolic and diastolic heart failure, chronic ITP, CKD, and a fib (on Xarelto PTA, last dose on 09/10/19 at 1730). Pt is unable to take PO medications due to mental status. Pharmacy has been consulted to dose Lovenox. Primary team is avoiding heparin due to sodium load with heparin infusion since patient's sodium is improving. Pt has hx of GI bleed in November.  Pt with LE temp discrepancy, suspected arterial blockage.  H/H 12.1/38.6 (down), platelets 131(~stable); Scr 1.28, CrCl 38.4 ml/min (renal function stable); TBW CrCl ~43 ml/min; d-dimer trend: 1.65>1.53>2.37>2.27.  Anti-Xa level ~4.5 hrs after last dose of Lovenox 70 mg SQ daily was 0.36 units/ml, which is below the goal range for this pt. Per RN, no issues with bleeding observed.  Goal of Therapy:  Anti-Xa level 0.6-1 units/ml 4hrs after LMWH dose given Monitor platelets by anticoagulation protocol: Yes   Plan:  Renal function has stabilized, so will convert pt to Lovenox 1 mg/kg (85 mg) SQ Q 12 hr regimen Monitor CBC, renal function Monitor for signs/symptoms of bleeding Check follow up anti-Xa level at steady state  [name], PharmD, BCPS, [facility] Clinical Pharmacist 09/17/2019,6:14 PM

## 2019-09-17 NOTE — Progress Notes (Signed)
  Speech Language Pathology Treatment: Dysphagia  Patient Details Name: DEQUANN VANDERVELDEN MRN: [id number] DOB: 24-Aug-1923 Today's Date: 09/17/2019 Time: 8295-6213 SLP Time Calculation (min) (ACUTE ONLY): 48 min  Assessment / Plan / Recommendation Clinical Impression  Daughter Lelon Frohlich present and discussed goals of session re: oral care and observation with po's if able. Due to his chronic back pain he is unable to lie on back and head of bed raised while pt on his side and used reverse Trendelenburg position. With most stimulation he moaned and cried as if in pain or distress, awake but eyes remained closed and did not verbalize during session. Observed were excessive dried secretions covering majority of hard and soft palate Interventions included use of toothette, verbal and tactile calming/redirection and ice. SLP set up Rosary Lively and briefly unhooked his suction catheter (for urine). Intermittently able to enter through gap in dentition to reach tongue- pt immediately bit on toothette each attempt and therapist was never able to access his palate and dried secretions remain. Able to get one ice chip onto tongue and observed swallow only after 5 seconds. After removing dried skin from lips his  discomfort intensified and efforts stopped to allow pt to return to more calm state. Uncertain of overall prognosis which appears guarded to fair from this therapists view. Discussed with RN to continue efforts at oral care as pt will allow. Ann reports Palliative care meeting with more family members planned for tomorrow. Will follow along.    HPI HPI: 86 y.o. male presenting with continuation of altered mental status . PMH is significant for recent Covid diagnosis s/p hospitalization with remdesivir (4 doses), HTN, DVT, CVA, systolic and diastolic heart failure, chronic ITP, CKD.  Palliative care is following pt. Pt has had poor PO intake due to fluctuations in MS.  SLP was ordered to assess his swallowing  function.       SLP Plan  Continue with current plan of care       Recommendations  Diet recommendations: NPO                Oral Care Recommendations: Oral care QID Follow up Recommendations: 24 hour supervision/assistance;Skilled Nursing facility SLP Visit Diagnosis: Dysphagia, unspecified (R13.10) Plan: Continue with current plan of care       GO                Houston Siren 09/17/2019, 3:51 PM  Orbie Pyo Kenyette Gundy M.Ed Risk analyst [phone] Office [phone]

## 2019-09-17 NOTE — Progress Notes (Signed)
Patient ID: [name], male   DOB: June 23, 1924, 86 y.o.   MRN: [id number]  This NP visited patient at the bedside as a follow up for palliative medicine needs and emotional support.  Patient currently remains intermittently confused/agitated and unable to follow commands.  He appears generally uncomfortable.  Met with daughter and at bedside for continued conversation regarding current medical situation; diagnosis, prognosis, high risk for decompensation and long-term poor prognosis.  Created space and opportunity for and explore thoughts and feelings regarding her father's current medical situation.  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  And verbalizes understanding and is intermittently tearful as she expresses her desire that her father begin to improve, however she does not want him to "not suffer".    Shared the difference between an aggressive medical intervention path and a palliative comfort path for this patient at this time in this situation.  We discussed hospice benefit.  Discussed with daughter  the importance of continued conversation with family and their  medical providers regarding overall plan of care and treatment options,  ensuring decisions are within the context of the patients values and GOCs.  Family meeting set for tomorrow at 1:30.  Discussed with Dr. Nori Riis and she plans to be present.  Questions and concerns addressed    Total time spent on the unit was 45 minutes  Greater than 50% of the time was spent in counseling and coordination of care  Wadie Lessen NP  Palliative Medicine Team Team Phone # [phone] Pager [phone]

## 2019-09-17 NOTE — Evaluation (Signed)
Occupational Therapy Evaluation Patient Details Name: [name] MRN: [id number] DOB: [birth year] Today's Date: 09/17/2019    History of Present Illness 86 y.o. male presenting with continuation of altered mental status and admitted for pneumonia. PMH is significant for recent Covid diagnosis s/p hospitalization with remdesivir (4 doses), HTN, DVT, CVA, systolic and diastolic heart failure, chronic ITP, CK.   Clinical Impression   Pt was assisted for fine motor and LB aspects of ADL, housekeeping and meal prep prior to 2 weeks ago. He was able to drive and mobilized independently. Pt seen with daughter bedside and participating. Pt moaning and restless and positioned on his L side. Per daughter, pt has a long history of back pain. Placed pillow between knees and at back and warm packs to low back. Pt unable to participate in any further activities. Per daughter, pt takes pain meds (Lyrica) and muscle relaxer (Robaxin) at home and has not had these medications since hospitalization.Pt also noted to moan with urination (has male external catheter). Left message for palliative medicine as daughter has not seen her father this uncomfortable. Will follow pt acutely.    Follow Up Recommendations  Supervision/Assistance - 24 hour(will depend on progress)    Equipment Recommendations  Hospital bed;Other (comment);Wheelchair (measurements OT);Wheelchair cushion (measurements OT)(hoyer lift)    Recommendations for Other Services       Precautions / Restrictions Precautions Precautions: Fall Precaution Comments: hx of LBP Restrictions Weight Bearing Restrictions: No      Mobility Bed Mobility               General bed mobility comments: pt on L side, assisted to place pillow between knees and behind back, placed heat packs on back  Transfers                 General transfer comment: deferred    Balance                                            ADL either performed or assessed with clinical judgement   ADL                                         General ADL Comments: currently dependent     Vision Baseline Vision/History: Wears glasses Wears Glasses: At all times Additional Comments: pt keeping eyes closed much of session, unable to accurately assess vision     Perception     Praxis      Pertinent Vitals/Pain Pain Assessment: Faces Faces Pain Scale: Hurts worst Pain Location: generalized (hx of low back pain) Pain Descriptors / Indicators: Discomfort;Moaning;Guarding;Constant;Restless Pain Intervention(s): Monitored during session;Repositioned;Ice applied;Limited activity within patient's tolerance     Hand Dominance Right   Extremity/Trunk Assessment Upper Extremity Assessment Upper Extremity Assessment: LUE deficits/detail LUE Deficits / Details: hx of carpal tunnel, limited fine motor   Lower Extremity Assessment Lower Extremity Assessment: Defer to PT evaluation   Cervical / Trunk Assessment Cervical / Trunk Assessment: Other exceptions Cervical / Trunk Exceptions: hx of low back pain   Communication Communication Communication: HOH   Cognition Arousal/Alertness: Lethargic Behavior During Therapy: Flat affect;Agitated Overall Cognitive Status: Difficult to assess  General Comments: Eyes closed. Moaning throughout session. Resistive to mobility, likely as pain response.   General Comments       Exercises     Shoulder Instructions      Home Living Family/patient expects to be discharged to:: Private residence Living Arrangements: Other relatives;Alone;Children Available Help at Discharge: Family;Available 24 hours/day Type of Home: House Home Access: Stairs to enter [employer] of Steps: 5 Entrance Stairs-Rails: Right;Left Home Layout: One level     Bathroom Shower/Tub: Occupational psychologist:  Standard     Home Equipment: Cane - single point;Walker - 2 wheels;Transport chair;Shower seat;Hand held shower head;Bedside commode;Adaptive equipment Adaptive Equipment: Other (Comment)(button aide, shoe buttons) Additional Comments: per daughter      Prior Functioning/Environment Level of Independence: Needs assistance  Gait / Transfers Assistance Needed: difficulty rising from low surfaces, bed, chairs and toilet are all elevated ADL's / Homemaking Assistance Needed: daughters help with dressing, showering and all IADL, pt drives            OT Problem List: Decreased strength;Decreased activity tolerance;Pain      OT Treatment/Interventions: Self-care/ADL training;Therapeutic activities;Patient/family education;Balance training    OT Goals(Current goals can be found in the care plan section) Acute Rehab OT Goals Patient Stated Goal: for pt to be comfortable OT Goal Formulation: With family Time For Goal Achievement: 10/01/19 Potential to Achieve Goals: Fair ADL Goals Pt Will Perform Grooming: with min assist;sitting(or bed level) Additional ADL Goal #1: Pt will perform bed mobility with max assist in preparation for ADL. Additional ADL Goal #2: Pt will sit EOB with moderate assistance x 5 minutes in preparation for ADL. Additional ADL Goal #3: Pt will follow one step commands 50% of time.  OT Frequency: Min 2X/week   Barriers to D/C:            Co-evaluation              AM-PAC OT "6 Clicks" Daily Activity     Outcome Measure Help from another person eating meals?: Total Help from another person taking care of personal grooming?: Total Help from another person toileting, which includes using toliet, bedpan, or urinal?: Total Help from another person bathing (including washing, rinsing, drying)?: Total Help from another person to put on and taking off regular upper body clothing?: Total Help from another person to put on and taking off regular lower body  clothing?: Total 6 Click Score: 6   End of Session Nurse Communication: Other (comment)(phone call placed to palliative medicine regarding pain)  Activity Tolerance: Patient limited by pain Patient left: in bed;with call bell/phone within reach;with family/visitor present  OT Visit Diagnosis: Pain;Muscle weakness (generalized) (M62.81)                Time: 1583-0940 OT Time Calculation (min): 24 min Charges:  OT General Charges $OT Visit: 1 Visit OT Evaluation $OT Eval Moderate Complexity: 1 Mod OT Treatments $Therapeutic Activity: 8-22 mins  Nestor Lewandowsky, OTR/L Acute Rehabilitation Services Pager: [phone] Office: [phone]  Malka So 09/17/2019, 4:27 PM

## 2019-09-18 DIAGNOSIS — R451 Restlessness and agitation: Secondary | ICD-10-CM

## 2019-09-18 DIAGNOSIS — R0600 Dyspnea, unspecified: Secondary | ICD-10-CM

## 2019-09-18 DIAGNOSIS — R0609 Other forms of dyspnea: Secondary | ICD-10-CM

## 2019-09-18 DIAGNOSIS — R627 Adult failure to thrive: Secondary | ICD-10-CM

## 2019-09-18 LAB — BASIC METABOLIC PANEL
Anion gap: 8 (ref 5–15)
BUN: 17 mg/dL (ref 8–23)
CO2: 23 mmol/L (ref 22–32)
Calcium: 8.6 mg/dL — ABNORMAL LOW (ref 8.9–10.3)
Chloride: 115 mmol/L — ABNORMAL HIGH (ref 98–111)
Creatinine, Ser: 1.35 mg/dL — ABNORMAL HIGH (ref 0.61–1.24)
GFR calc Af Amer: 52 mL/min — ABNORMAL LOW (ref >60)
GFR calc non Af Amer: 45 mL/min — ABNORMAL LOW (ref >60)
Glucose, Bld: 181 mg/dL — ABNORMAL HIGH (ref 70–99)
Potassium: 3.8 mmol/L (ref 3.5–5.1)
Sodium: 146 mmol/L — ABNORMAL HIGH (ref 135–145)

## 2019-09-18 LAB — CBC
HCT: 39.1 % (ref 39.0–52.0)
Hemoglobin: 12.2 g/dL — ABNORMAL LOW (ref 13.0–17.0)
MCH: 27.2 pg (ref 26.0–34.0)
MCHC: 31.2 g/dL (ref 30.0–36.0)
MCV: 87.3 fL (ref 80.0–100.0)
Platelets: 128 10*3/uL — ABNORMAL LOW (ref 150–400)
RBC: 4.48 MIL/uL (ref 4.22–5.81)
RDW: 14.8 % (ref 11.5–15.5)
WBC: 8.1 10*3/uL (ref 4.0–10.5)
nRBC: 0 % (ref 0.0–0.2)

## 2019-09-18 LAB — C-REACTIVE PROTEIN: CRP: 13.8 mg/dL — ABNORMAL HIGH (ref <1.0)

## 2019-09-18 MED ORDER — ENOXAPARIN SODIUM 80 MG/0.8ML ~~LOC~~ SOLN: 70.0000 mg | Freq: Two times a day (BID) | SUBCUTANEOUS | Status: DC

## 2019-09-18 MED ORDER — MORPHINE SULFATE (PF) 2 MG/ML IV SOLN
2.0000 mg | Freq: Once | INTRAVENOUS | Status: AC
  Administered 2019-09-18: 2 mg via INTRAVENOUS
  Filled 2019-09-18: qty 1

## 2019-09-18 MED ORDER — MORPHINE SULFATE (PF) 2 MG/ML IV SOLN
2.0000 mg | INTRAVENOUS | Status: DC | PRN
  Administered 2019-09-18: 2 mg via INTRAVENOUS
  Filled 2019-09-18: qty 1

## 2019-09-18 MED ORDER — MORPHINE SULFATE (PF) 2 MG/ML IV SOLN
INTRAVENOUS | Status: AC
  Administered 2019-09-18: 2 mg via INTRAVENOUS
  Filled 2019-09-18: qty 1

## 2019-09-18 MED ORDER — HALOPERIDOL LACTATE 5 MG/ML IJ SOLN
5.0000 mg | Freq: Once | INTRAMUSCULAR | Status: AC
  Administered 2019-09-18: 5 mg via INTRAVENOUS
  Filled 2019-09-18: qty 1

## 2019-09-18 MED ORDER — FUROSEMIDE 10 MG/ML IJ SOLN: 40.0000 mg | Freq: Once | INTRAMUSCULAR | Status: AC

## 2019-09-18 MED ORDER — MORPHINE SULFATE (PF) 2 MG/ML IV SOLN: 2.0000 mg | Freq: Once | INTRAVENOUS | Status: AC

## 2019-09-18 MED ORDER — FUROSEMIDE 10 MG/ML IJ SOLN
INTRAMUSCULAR | Status: AC
  Administered 2019-09-18: 40 mg via INTRAVENOUS
  Filled 2019-09-18: qty 4

## 2019-09-18 MED ORDER — LORAZEPAM 2 MG/ML IJ SOLN
1.0000 mg | Freq: Four times a day (QID) | INTRAMUSCULAR | Status: DC | PRN
  Administered 2019-09-18: 1 mg via INTRAVENOUS
  Filled 2019-09-18: qty 1

## 2019-10-11 NOTE — Progress Notes (Signed)
PT Cancellation Note  Patient Details Name: [name] MRN: [id number] DOB: 1924/04/08   Cancelled Treatment:    Reason Eval/Treat Not Completed: Medical issues which prohibited therapy. RN reported pt is in comfort care and did not advise therapy at this time. Will follow up tomorrow if appropriate.   Jodelle Green, PT, DPT Acute Rehabilitation Services Office [phone]   Jodelle Green 10/05/19, 1:26 PM

## 2019-10-11 NOTE — Progress Notes (Signed)
CALL PAGER [phone] for any questions or notifications regarding this patient  FMTS Attending Note: [name] MD Called by nursing with patient having respiratory distress. At bedside with daughter. Family on t heir way. He is on non rebreather sat 87%. I have given 2 mg morphine.

## 2019-10-11 NOTE — Progress Notes (Signed)
Provider notified that patient's work of breathign has increased, RR 44, accessory muscle involvement, ST 140s, desaturation to 67% on 7L NL. Patient switched to nonrebreather. Provider arrived at bedside and ordered morphine 2mg  IV. Morphine was effective. Patient resting well afterwards. Palliative consulted per family's request.

## 2019-10-11 NOTE — Progress Notes (Signed)
Able to give partial mouth care to pt with help of another nurse. Pt tried to bite swab when I tried to clean tongue. I was able to clean inner cheeks, lips, and gums.

## 2019-10-11 NOTE — Progress Notes (Addendum)
Family Medicine Teaching Service Daily Progress Note Intern Pager: [phone]  Patient name: [name] Medical record number: [id number] Date of birth: Apr 11, 1924 Age: 86 y.o. Gender: male  Primary Care Provider: Leeanne Rio, MD Consultants: None Code Status: Full  [name]:  09/24/2019 admitted 09/15/19 transferred out of COVID floor (2/12 positive), L ABI 0.7  Assessment and Plan: [name] is a 86 y.o. male presenting with continuation of altered mental status . PMH is significant for recent Covid diagnosis s/p hospitalization with remdesivir (4 doses), HTN, DVT, CVA, systolic and diastolic heart failure, chronic ITP, CKD  COVID-19+ pneumonia- worsening Called to room after rounds, patient in respiratory distress, has not been in distress so far this admission. Earlier this morning lungs were difficult to assess due to moaning sounds and pt unable to follow commands. Now pt on 15L NRB and working hard to breathe w/ accessory muscles, breath sounds more wet with crackles heard bibasilarly. 2mg  morphine given. Daughter at bedside. So far [name] has only required supportive care, but with no significant improvement in mentation since last Friday; he remains alert, not oriented, nonverbal, unable to follow commands. Prior to today, FPTS had been hopeful that patient would recover mentation and improve. However, with this decline in status, I suspect he may be approaching the end.  - continue oxygen therapy as needed  -Pain control: to be decided with palliative care at family meeting today, see below  [place]- Patient was made DNR yesterday. Family and FPTS team were hopeful that since [name] had only needed supportive care thus far, that he may improve and regain some mental capacity. He was driving himself as recently as last Tuesday. However, he has not made meaningful improvement in mentation since last Friday: still alert, but  disoriented, nonverbal, unable to follow commands. Family is planning to have a meeting with palliative care at 1:30 PM today. We greatly appreciate palliative team's care, and we will be available to family for questions and support. - f/u palliative care family meeting  PAD- Bilateral lower limbs are the same temperature and color, pulse still poorly palpated on left. ABIs obtained, R leg WNL, L leg 0.7 ABI. Unable to do vascular US doppler due to mentation (must be able to follow commands). He is anticoagulated on lovenox as well as SCDs in place. D-dimer increased from admission and steady from yesterday (1.65>1.53>2.37>2.27). h/o DVT, HLD, CAD, Afib, chronic ITP. - continue to monitor for DVT  Hypovolemic hypernatremia- resolved Na elelvated to 146 today. Has been on D5 1/2 NS with 79mEq K+ at 160mL/hr overnight (just below maintenance rate of 167mL/hr). Will make no changes until after family meeting at 1:30 PM today, please see [place] above. - continue current IV fluids at 170mL/hr and carefully monitor for signs of fluid overload.  - Consider repeat chest xray if respiratory status worsens to evaluate for edema - BMP daily + PRN  Afib  Tachycardia- chronic, rate controlled and anticoagulated HR 87-126 ON. On home dose IV equivalent of metoprolol with borderline hypotension. Tachycardia could be due to increased pain. Today, BP tolerating: SBP 105-139/DBP 52-98, most recently 139/53 -continue lovenox per pharmacy consult -Continue Metoprolol IV   AKI- resolved Cr 1.35, patient is spontaneously voiding.  Will closely monitor and change IVF to D5 + free water today. -daily BMP -Avoid nephrotoxic agents - strict I/O  HTN- chronic, moderately stable with borderline hypotension 139/53 this am -Continue Metoprolol IV, parameters to hold  if MAP <65 -continue hold of home diuresis  Chronic Back Pain- unable to accurately assess pain due to mental status. Moans and groans, stops with IV  fentanyl q4h, becomes worse 2 hours in. Discussing pain plan today, see GOC above. - tylenol suppository PRN.   HLD- chronic - holding home statin as NPO  Chronic ITP- Chronic. Platelets stable for patient at 128 today. No signs of acute bleed but signs of LE thrombus as discussed above. - plan as above  GERD- Chronic. Stable.  - IV PPI for prolonged NPO  FEN/GI: Diet NPO except sips w/ meds, senna PRN, IV protonix PPx: lovenox per pharmacy consult  Disposition:  Family meeting with palliative care today at 1:30  Subjective:  Patient alert, not oriented, nonverbal and unable to follow commands. In respiratory distress, Dr. Nori Riis and myself at bedside with family. 2mg  morphine given, pt calmer.  Objective: Temp:  [98.6 F (37 C)-99.3 F (37.4 C)] 99.3 F (37.4 C) (03/08 2126) Pulse Rate:  [87-126] 126 (03/09 0549) Resp:  [16-28] 28 (03/09 0551) BP: (105-139)/(52-98) 139/53 (03/09 0549) SpO2:  [90 %-92 %] 92 % (03/09 0549) Physical Exam: General: older man, in moderate distress, lying in bed, alert but not oriented Cardiovascular: irregular rate, tachycardic. No m/r/g Respiratory: increased WOB, accessory muscles in use, crackles heard at bilateral bases, NRB at 15L O2 Abdomen: soft, NT, mildly distended, normal BS present Extremities: no edema in bilateral LE,  R warm, dry, strong pedal pulse.  L warm, dry, weak pedal pulse.   Laboratory: Recent Labs  Lab 09/16/19 0915 09/17/19 0620 10/02/19 0225  WBC 7.6 7.9 8.1  HGB 13.2 12.1* 12.2*  HCT 42.1 38.6* 39.1  PLT 130* 131* 128*   Recent Labs  Lab 10/02/2019 1351 09/30/2019 2045 09/13/19 0237 09/13/19 0938 09/16/19 0915 09/17/19 0620 10/02/19 0225  NA 159*   < > 158*   < > 144 146* 146*  K 4.1   < > 3.4*   < > 3.5 3.6 3.8  CL 118*   < > 121*   < > 114* 117* 115*  CO2 26   < > 22   < > 22 20* 23  BUN 58*   < > 43*   < > 18 18 17   CREATININE 1.94*   < > 1.74*   < > 1.22 1.28* 1.35*  CALCIUM 9.5   < > 9.1   < >  8.5* 7.6* 8.6*  PROT 7.5  --  6.2*  --   --   --   --   BILITOT 2.4*  --  2.7*  --   --   --   --   ALKPHOS 90  --  69  --   --   --   --   ALT 25  --  28  --   --   --   --   AST 43*  --  58*  --   --   --   --   GLUCOSE 138*   < > 226*   < > 199* 159* 181*   < > = values in this interval not displayed.    Imaging/Diagnostic Tests: No results found. Gladys Damme, MD 2019/10/02, 6:39 AM PGY-1, [facility] Intern pager: [phone], text pages welcome

## 2019-10-11 NOTE — Progress Notes (Signed)
Pt moaning/yells per shift. Pain meds given according to schedule. Pt was found to be pulling at PICC line, so guaze wrapped around arm and lines hidden. Will reassess per shift. Pt moans when try to adjust in bed and favors left side. Mouth care attempted but pt swatting arms and moving head. Will try and reattempt at a later time.

## 2019-10-11 NOTE — Progress Notes (Signed)
Paged regarding patient having increased pain/agitation.  Went to evaluate patient.  Patient is lying in bed moaning when I enter the room.  He will not follow commands.  Pulse is mildly tach in the low 100s.  Patient will not follow commands and it is difficult to determine if the patient is having increased pain versus increased agitation.  Instructed nurse to give next scheduled dose of 0.25 mcg fentanyl early.  I asked him to page me for further concerns.

## 2019-10-11 NOTE — Death Summary Note (Signed)
[name] was admitted with COVID-19 infection.  His course was complicated by hypernatremia which eventually resolved and by delirium which did not resolve.  He had increasing work of breathing, some pulmonary edema and desaturations today that was accompanied by significant increased agitation.  Family was at bedside.  All family members were in agreement to change the treatment plan to comfort care.  He responded well to morphine which calmed his agitation and he was breathing much more comfortably this afternoon.  He died at 15:08  with all 4 children in attendance at his bedside.

## 2019-10-11 NOTE — Progress Notes (Signed)
Notified providers that pain medication ordered was not sufficient. Haldol 5 mg was ordered.

## 2019-10-11 NOTE — Progress Notes (Addendum)
Reached out to On call provider Cresenzo about modifying pt pain order. Also asking for permission to give 530 dose.   Pt did well with pain med given early. Pt went to sleep and stopped the moaning/yelling for about 1.5-2 hour range. Will continue to reassess.     0630 Cresenzo stated to not give 0530 pain med. Stated that he would discuss case with day shift providers.

## 2019-10-11 NOTE — Progress Notes (Signed)
Pt has increased moaning/yelling per shift. Reached out to on call for additional PRN pain dose to see if it would help.

## 2019-10-11 NOTE — Progress Notes (Signed)
Patient ID: [name], male   DOB: [birth year], 86 y.o.   MRN: [id number]  This NP visited patient at the bedside as a follow up for palliative medicine needs and emotional support.  Family of 4 children has gathered at bedside having been called to the hospital by attending for patient's significant decline this morning.   Family verbalized their understanding of the patient's limited prognosis and the goal of care has shifted to a full comfort approach.  Plan of care: -DNR/DNI -No artificial feeding or hydration now or in the future -No further diagnostics, lab draws -Symptom management      -Morphine 2 mg IV every 1 hour as needed for dyspnea/pain-- discussed with family that if this is not efficacious we may consider continuous drip      - Ativan 1 mg IV every 6 hours as needed for agitation  Created space and opportunity for family to  explore thoughts and feelings regarding their father's current medical situation.   All expressed their feelings of love for their father and for one another.  They are all comfortable with decision to shift to full comfort knowing clearly their father's wishes.  Discussed the natural trajectory and expectations at end of life.  We discussed that anything could happen at any time and that prognosis is likely hours to days.  Will reevaluate in the morning for possible transition of care to inpatient hospice unit.      Discussed with Dr. Nori Riis.              Questions and concerns addressed    Total time spent on the unit was 35 minutes  Greater than 50% of the time was spent in counseling and coordination of care  Wadie Lessen NP  Palliative Medicine Team Team Phone # [phone] Pager [phone]

## 2019-10-11 DEATH — deceased

## 2020-06-23 ENCOUNTER — Telehealth: Payer: Self-pay | Admitting: Family Medicine

## 2020-06-23 NOTE — Telephone Encounter (Signed)
Informed Ann of below. [name], CMA

## 2020-06-23 NOTE — Telephone Encounter (Signed)
Patients daughter Lelon Frohlich is calling and would like to know if Dr. Nori Riis can write a letter stating that she was the patients PCP and that the patient did pass away from covid complications. She needs it to submit for funeral reimbursement. She would like the letter to be mailed to the patients address listed in the chart.   The best call back number with any questions is [phone]

## 2020-06-23 NOTE — Telephone Encounter (Signed)
Dear [name] Team Yes I will do a note. I am routing it to the admin team so they will mail it out Please let the family know they should get it in the next few days [name]
